# Patient Record
Sex: Female | Born: 1950 | Race: White | Hispanic: No | Smoking: Former smoker
Health system: Southern US, Community
[De-identification: ages and names within clinical notes are randomized; demographics above are authoritative.]

## PROBLEM LIST (undated history)

## (undated) DIAGNOSIS — E669 Obesity, unspecified: Secondary | ICD-10-CM

## (undated) DIAGNOSIS — I499 Cardiac arrhythmia, unspecified: Secondary | ICD-10-CM

## (undated) DIAGNOSIS — J45909 Unspecified asthma, uncomplicated: Secondary | ICD-10-CM

## (undated) DIAGNOSIS — K5792 Diverticulitis of intestine, part unspecified, without perforation or abscess without bleeding: Secondary | ICD-10-CM

## (undated) DIAGNOSIS — E785 Hyperlipidemia, unspecified: Secondary | ICD-10-CM

## (undated) DIAGNOSIS — I639 Cerebral infarction, unspecified: Secondary | ICD-10-CM

## (undated) DIAGNOSIS — Z952 Presence of prosthetic heart valve: Secondary | ICD-10-CM

## (undated) DIAGNOSIS — I10 Essential (primary) hypertension: Secondary | ICD-10-CM

## (undated) DIAGNOSIS — G4733 Obstructive sleep apnea (adult) (pediatric): Secondary | ICD-10-CM

## (undated) DIAGNOSIS — M1990 Unspecified osteoarthritis, unspecified site: Secondary | ICD-10-CM

## (undated) DIAGNOSIS — Z720 Tobacco use: Secondary | ICD-10-CM

## (undated) DIAGNOSIS — I495 Sick sinus syndrome: Secondary | ICD-10-CM

## (undated) DIAGNOSIS — I471 Supraventricular tachycardia, unspecified: Secondary | ICD-10-CM

## (undated) DIAGNOSIS — D179 Benign lipomatous neoplasm, unspecified: Secondary | ICD-10-CM

## (undated) DIAGNOSIS — F419 Anxiety disorder, unspecified: Secondary | ICD-10-CM

## (undated) DIAGNOSIS — G709 Myoneural disorder, unspecified: Secondary | ICD-10-CM

## (undated) DIAGNOSIS — F32A Depression, unspecified: Secondary | ICD-10-CM

## (undated) DIAGNOSIS — G4730 Sleep apnea, unspecified: Secondary | ICD-10-CM

## (undated) DIAGNOSIS — I5042 Chronic combined systolic (congestive) and diastolic (congestive) heart failure: Secondary | ICD-10-CM

## (undated) DIAGNOSIS — I5189 Other ill-defined heart diseases: Secondary | ICD-10-CM

## (undated) DIAGNOSIS — I340 Nonrheumatic mitral (valve) insufficiency: Secondary | ICD-10-CM

## (undated) DIAGNOSIS — I38 Endocarditis, valve unspecified: Secondary | ICD-10-CM

## (undated) DIAGNOSIS — M549 Dorsalgia, unspecified: Secondary | ICD-10-CM

## (undated) DIAGNOSIS — G8929 Other chronic pain: Secondary | ICD-10-CM

## (undated) DIAGNOSIS — T7840XA Allergy, unspecified, initial encounter: Secondary | ICD-10-CM

## (undated) DIAGNOSIS — I480 Paroxysmal atrial fibrillation: Secondary | ICD-10-CM

## (undated) HISTORY — DX: Depression, unspecified: F32.A

## (undated) HISTORY — DX: Unspecified asthma, uncomplicated: J45.909

## (undated) HISTORY — PX: OVARY SURGERY: SHX727

## (undated) HISTORY — PX: BACK SURGERY: SHX140

## (undated) HISTORY — PX: SALPINGOOPHORECTOMY: SHX82

## (undated) HISTORY — DX: Other chronic pain: G89.29

## (undated) HISTORY — DX: Obstructive sleep apnea (adult) (pediatric): G47.33

## (undated) HISTORY — DX: Hyperlipidemia, unspecified: E78.5

## (undated) HISTORY — DX: Cardiac arrhythmia, unspecified: I49.9

## (undated) HISTORY — DX: Allergy, unspecified, initial encounter: T78.40XA

## (undated) HISTORY — DX: Obesity, unspecified: E66.9

## (undated) HISTORY — PX: TUMOR REMOVAL: SHX12

## (undated) HISTORY — PX: KNEE SURGERY: SHX244

## (undated) HISTORY — DX: Sick sinus syndrome: I49.5

## (undated) HISTORY — PX: ACHILLES TENDON SURGERY: SHX542

## (undated) HISTORY — DX: Sleep apnea, unspecified: G47.30

## (undated) HISTORY — DX: Nonrheumatic mitral (valve) insufficiency: I34.0

## (undated) HISTORY — DX: Anxiety disorder, unspecified: F41.9

## (undated) HISTORY — DX: Tobacco use: Z72.0

## (undated) HISTORY — DX: Other ill-defined heart diseases: I51.89

## (undated) HISTORY — DX: Benign lipomatous neoplasm, unspecified: D17.9

## (undated) HISTORY — DX: Dorsalgia, unspecified: M54.9

## (undated) HISTORY — DX: Presence of prosthetic heart valve: Z95.2

## (undated) HISTORY — DX: Chronic combined systolic (congestive) and diastolic (congestive) heart failure: I50.42

## (undated) HISTORY — PX: KNEE ARTHROSCOPY W/ MENISCAL REPAIR: SHX1877

## (undated) HISTORY — DX: Unspecified osteoarthritis, unspecified site: M19.90

## (undated) HISTORY — DX: Endocarditis, valve unspecified: I38

## (undated) HISTORY — DX: Diverticulitis of intestine, part unspecified, without perforation or abscess without bleeding: K57.92

## (undated) HISTORY — DX: Cerebral infarction, unspecified: I63.9

## (undated) HISTORY — DX: Myoneural disorder, unspecified: G70.9

---

## 2006-10-07 ENCOUNTER — Ambulatory Visit (HOSPITAL_BASED_OUTPATIENT_CLINIC_OR_DEPARTMENT_OTHER): Admission: RE | Admit: 2006-10-07 | Discharge: 2006-10-08 | Payer: Self-pay | Admitting: Orthopedic Surgery

## 2006-11-13 ENCOUNTER — Ambulatory Visit: Payer: Self-pay | Admitting: Vascular Surgery

## 2006-11-13 ENCOUNTER — Ambulatory Visit (HOSPITAL_COMMUNITY): Admission: RE | Admit: 2006-11-13 | Discharge: 2006-11-13 | Payer: Self-pay | Admitting: Orthopedic Surgery

## 2009-12-26 ENCOUNTER — Ambulatory Visit: Payer: Self-pay | Admitting: Family Medicine

## 2010-09-18 NOTE — Op Note (Signed)
NAMEDYMPHNA, WADLEY               ACCOUNT NO.:  [id number]   MEDICAL RECORD NO.:  [id number]          PATIENT TYPE:  AMB   LOCATION:  DSC                          FACILITY:  [facility]   PHYSICIAN:  Harvie Junior, M.D.   DATE OF BIRTH:  03-03-1951   DATE OF PROCEDURE:  DATE OF DISCHARGE:                               OPERATIVE REPORT   PREOPERATIVE DIAGNOSES:  1. Medial and lateral meniscal tears.  2. Anterior cruciate ligament tear.  3. Patellofemoral chondromalacia.   POSTOPERATIVE DIAGNOSES:  1. Medial and lateral meniscal tears.  2. Anterior cruciate ligament tear.  3. Patellofemoral chondromalacia.   PRINCIPLE PROCEDURE:  1. Anterior cruciate ligament reconstruction with central one-third      patellar tendon allograft.  2. Debridement of posterior horn medial meniscal tear.  3. Debridement of posterior horn lateral meniscal tear with      corresponding debridement in those compartments.  4. Debridement of chondromalacia patella.   SURGEON:  Harvie Junior, M.D.   ASSISTANT:  Marshia Ly, P.A.   ANESTHESIA:  General.   BRIEF HISTORY:  The patient is a 60 year old female with a history of  having had a hyperextension type injury falling off a ladder.  She  suffered anterior cruciate ligament tear as well as medial and lateral  meniscal tears.  She was treated conservatively for a period of time  with failure.  Subsequently complains of pain and symptoms of the knee  feels like it wants to give out.  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  She was brought to the operating room for evaluation and  decision making.   DESCRIPTION OF THE PROCEDURE:  The patient was brought to the operating  room.   After adequate anesthesia was obtained with general anesthetic,  the patient was placed on the operating table.  The right leg was  prepped and draped in the usual sterile fashion.  Following this,  routine arthroscopic examination of the knee revealed there was an  obvious posterior horn lateral and posterior horn medial meniscal tears.  These were debrided with straight biting forceps back to a smooth and  stable rim.  We examined her under anesthesia and noted her to have  quite a bit of grinding, especially back laterally and that she was  quite unstable.  We did the arthroscopy as outlined, looked at the ACL.  It was obviously completely torn.  At this point we made a close  examination of the medial compartment.  There was some grade 2 change on  the tibia.  The medial femoral condyle was pristine.  Lateral  compartment also showed some grade 2 change on the tibia but lateral  femoral condyle was pristine as well.  At that point it was felt that  the most appropriate course of action was going to be ACL  reconstruction, and this was performed in the following fashion.  We did  a notchplasty with a combination of a suction shaver and bur.  Once this  was done, we used a tibial guide and this was set 7 mm anterior to the  posterior cruciate ligament.  Using the gun barrel, we made a small  incision down distally and then made a reamed hole of 10 over the  centralizing gun barrel guidewire.  Once this was done, a rasp was used  to rasp the back tunnel.  A 7 mm over-the-top guide was then chosen just  over the back of the femoral side and a hole was drilled.  We check to  make sure it was a hole and not a ditch.  Once this was done, we drilled  this out to 22 mm for a 22 mm femoral side bone plug.  A notch was  placed for the screw, and the graft was then advanced into the knee out  of a guidewire which had been advanced out of the distal lateral femur.  Once a new graft was then placed,  it was locked on the femoral side by 7  x 20 screw.  The knee was cycled 20 times through range of motion and  checking for stability.  No tendency towards anisometry.  The femoral  side graft was locked in place by an 8 x 20 screw, and the remaining  bone plug, which was sticking out, was nibbled off.  The final check was  made medially and laterally for any remaining pieces.  None were seen.  The knee was copiously and thoroughly irrigated and suctioned dry.  Then  all portals were closed with a bandage.  The distal incision was closed  with interrupted Vicryl and Maxon pullout suture.  Benzo and Steri-  Strips were applied.  A sterile compressive dressing was applied as well  as a knee immobilizer.  The patient was taken to the recovery room and  was noted to be in satisfactory condition.  Estimated blood loss from  the procedure was none.      Harvie Junior, M.D.  Electronically Signed     JLG/MEDQ  D:  10/07/2006  T:  10/07/2006  Job:  [id number]   cc:   _________

## 2011-02-21 LAB — BASIC METABOLIC PANEL
BUN: 16
Calcium: 9.3
GFR calc non Af Amer: >60
Glucose, Bld: 173 — ABNORMAL HIGH
Sodium: 141

## 2011-02-21 LAB — POCT HEMOGLOBIN-HEMACUE: Operator id: 116011

## 2011-09-04 ENCOUNTER — Ambulatory Visit: Payer: Self-pay

## 2014-07-15 ENCOUNTER — Emergency Department (HOSPITAL_COMMUNITY): Payer: 59

## 2014-07-15 ENCOUNTER — Encounter (HOSPITAL_COMMUNITY): Payer: Self-pay

## 2014-07-15 ENCOUNTER — Emergency Department (HOSPITAL_COMMUNITY)
Admission: EM | Admit: 2014-07-15 | Discharge: 2014-07-15 | Disposition: A | Discharge status: Home / Self Care | Payer: 59 | Attending: Emergency Medicine | Admitting: Emergency Medicine

## 2014-07-15 DIAGNOSIS — I471 Supraventricular tachycardia, unspecified: Secondary | ICD-10-CM | POA: Diagnosis present

## 2014-07-15 DIAGNOSIS — I10 Essential (primary) hypertension: Secondary | ICD-10-CM | POA: Diagnosis not present

## 2014-07-15 DIAGNOSIS — Z79899 Other long term (current) drug therapy: Secondary | ICD-10-CM | POA: Insufficient documentation

## 2014-07-15 DIAGNOSIS — E876 Hypokalemia: Secondary | ICD-10-CM | POA: Diagnosis present

## 2014-07-15 DIAGNOSIS — Z791 Long term (current) use of non-steroidal anti-inflammatories (NSAID): Secondary | ICD-10-CM | POA: Insufficient documentation

## 2014-07-15 DIAGNOSIS — Z720 Tobacco use: Secondary | ICD-10-CM | POA: Insufficient documentation

## 2014-07-15 DIAGNOSIS — R000 Tachycardia, unspecified: Secondary | ICD-10-CM | POA: Diagnosis present

## 2014-07-15 HISTORY — DX: Essential (primary) hypertension: I10

## 2014-07-15 LAB — CBC WITH DIFFERENTIAL/PLATELET
BASOS ABS: 0 10*3/uL (ref 0.0–0.1)
Basophils Relative: 0 % (ref 0–1)
EOS PCT: 1 % (ref 0–5)
Eosinophils Absolute: 0.1 10*3/uL (ref 0.0–0.7)
HCT: 45.3 % (ref 36.0–46.0)
Hemoglobin: 14.8 g/dL (ref 12.0–15.0)
LYMPHS PCT: 28 % (ref 12–46)
Lymphs Abs: 2.7 10*3/uL (ref 0.7–4.0)
MCH: 28.5 pg (ref 26.0–34.0)
MCHC: 32.7 g/dL (ref 30.0–36.0)
MCV: 87.1 fL (ref 78.0–100.0)
Monocytes Absolute: 0.6 10*3/uL (ref 0.1–1.0)
Monocytes Relative: 6 % (ref 3–12)
NEUTROS ABS: 6.2 10*3/uL (ref 1.7–7.7)
NEUTROS PCT: 65 % (ref 43–77)
PLATELETS: 260 10*3/uL (ref 150–400)
RBC: 5.2 MIL/uL — AB (ref 3.87–5.11)
RDW: 13.8 % (ref 11.5–15.5)
WBC: 9.6 10*3/uL (ref 4.0–10.5)

## 2014-07-15 LAB — BASIC METABOLIC PANEL
Anion gap: 12 (ref 5–15)
BUN: 14 mg/dL (ref 6–23)
CO2: 22 mmol/L (ref 19–32)
Calcium: 9.1 mg/dL (ref 8.4–10.5)
Chloride: 106 mmol/L (ref 96–112)
Creatinine, Ser: 1.12 mg/dL — ABNORMAL HIGH (ref 0.50–1.10)
GFR calc Af Amer: 59 mL/min — ABNORMAL LOW (ref >90)
GFR calc non Af Amer: 51 mL/min — ABNORMAL LOW (ref >90)
GLUCOSE: 177 mg/dL — AB (ref 70–99)
POTASSIUM: 3 mmol/L — AB (ref 3.5–5.1)
SODIUM: 140 mmol/L (ref 135–145)

## 2014-07-15 LAB — TSH: TSH: 2.682 u[IU]/mL (ref 0.350–4.500)

## 2014-07-15 LAB — I-STAT TROPONIN, ED: TROPONIN I, POC: 0 ng/mL (ref 0.00–0.08)

## 2014-07-15 LAB — MAGNESIUM: Magnesium: 1.9 mg/dL (ref 1.5–2.5)

## 2014-07-15 MED ORDER — DILTIAZEM HCL 25 MG/5ML IV SOLN
10.0000 mg | Freq: Once | INTRAVENOUS | Status: AC
  Administered 2014-07-15: 10 mg via INTRAVENOUS

## 2014-07-15 MED ORDER — DILTIAZEM HCL 100 MG IV SOLR
5.0000 mg/h | Freq: Once | INTRAVENOUS | Status: AC
  Administered 2014-07-15: 5 mg/h via INTRAVENOUS

## 2014-07-15 MED ORDER — DILTIAZEM HCL ER COATED BEADS 120 MG PO TB24: 120.0000 mg | ORAL_TABLET | Freq: Every day | ORAL | Status: DC

## 2014-07-15 MED ORDER — SODIUM CHLORIDE 0.9 % IV BOLUS (SEPSIS)
500.0000 mL | Freq: Once | INTRAVENOUS | Status: AC
  Administered 2014-07-15: 500 mL via INTRAVENOUS

## 2014-07-15 MED ORDER — POTASSIUM CHLORIDE 10 MEQ/100ML IV SOLN
10.0000 meq | Freq: Once | INTRAVENOUS | Status: AC
  Administered 2014-07-15: 10 meq via INTRAVENOUS
  Filled 2014-07-15: qty 100

## 2014-07-15 MED ORDER — DILTIAZEM HCL ER COATED BEADS 120 MG PO TB24
120.0000 mg | ORAL_TABLET | Freq: Every day | ORAL | Status: DC
  Administered 2014-07-15: 120 mg via ORAL
  Filled 2014-07-15 (×2): qty 1

## 2014-07-15 MED ORDER — POTASSIUM CHLORIDE CRYS ER 20 MEQ PO TBCR
30.0000 meq | EXTENDED_RELEASE_TABLET | Freq: Once | ORAL | Status: AC
  Administered 2014-07-15: 30 meq via ORAL
  Filled 2014-07-15: qty 2

## 2014-07-15 NOTE — ED Notes (Signed)
PER EMS: pt going in and out of SVT, episodes lasting about 15 seconds. EMS did not give Adenosine because she was going in and out of it so often. Pt reports she has been experiencing the "fluttering" for the past 6 months but has not been seen by a doctor for it. Pt A&OX4. Denies CP.

## 2014-07-15 NOTE — ED Notes (Signed)
Cardiology, PA at bedside  

## 2014-07-15 NOTE — ED Provider Notes (Signed)
CSN: [id number]     Arrival date & time 07/15/14  1515 History   First MD Initiated Contact with Patient 07/15/14 1517     Chief Complaint  Patient presents with  . Irregular Heart Beat  . SVT      (Consider location/radiation/quality/duration/timing/severity/associated sxs/prior Treatment) HPI   Patient w/ no sig PMH p/w palpitations since 1pm today almost constantly associated w/ some mild lightheadedness, no CP/SOB.  Patient has been having intermittent episodes for the past 6 months.  EMS obtained rhythm strip w/ rate approx 200, narrow complex and regular.  Brought her to the ED.  bp good in route.  Past Medical History  Diagnosis Date  . Hypertension    Past Surgical History  Procedure Laterality Date  . Back surgery    . Ovary surgery     No family history on file. History  Substance Use Topics  . Smoking status: Current Some Day Smoker  . Smokeless tobacco: Not on file  . Alcohol Use: Yes     Comment: socially   OB History    No data available     Review of Systems  Constitutional: Negative for fever and chills.  HENT: Negative for nosebleeds.   Eyes: Negative for visual disturbance.  Respiratory: Negative for cough and shortness of breath.   Cardiovascular: Positive for palpitations. Negative for chest pain.  Gastrointestinal: Negative for nausea, vomiting, abdominal pain, diarrhea and constipation.  Genitourinary: Negative for dysuria.  Skin: Negative for rash.  Neurological: Negative for weakness.      Allergies  Review of patient's allergies indicates no known allergies.  Home Medications   Prior to Admission medications   Medication Sig Start Date End Date Taking? Authorizing Provider  clonazePAM (KLONOPIN) 0.5 MG tablet Take 0.5 mg by mouth at bedtime as needed. 06/22/14   Historical Provider, MD  naproxen sodium (ANAPROX) 220 MG tablet Take 220 mg by mouth daily as needed.    Historical Provider, MD   BP 131/105 mmHg  Pulse 100  Temp(Src)  97.7 F (36.5 C) (Oral)  Resp 20  SpO2 98% Physical Exam  Constitutional: She is oriented to person, place, and time. No distress.  HENT:  Head: Normocephalic and atraumatic.  Eyes: EOM are normal. Pupils are equal, round, and reactive to light.  Neck: Normal range of motion. Neck supple.  Cardiovascular: Intact distal pulses.   Tachy   Pulmonary/Chest: No respiratory distress.  Abdominal: Soft. There is no tenderness.  Neurological: She is alert and oriented to person, place, and time.  Skin: No rash noted. She is not diaphoretic.    ED Course  Procedures (including critical care time) Labs Review Labs Reviewed  CBC WITH DIFFERENTIAL/PLATELET  BASIC METABOLIC PANEL  MAGNESIUM  TSH  URINE RAPID DRUG SCREEN (HOSP PERFORMED)  I-STAT TROPOININ, ED    Imaging Review No results found.   EKG Interpretation   Date/Time:  Friday July 15 2014 15:24:59 EST Ventricular Rate:  167 PR Interval:    QRS Duration: 76 QT Interval:  288 QTC Calculation: 480 R Axis:   54 Text Interpretation:  Atrial fibrillation with rapid V-rate ST depression,  probably rate related Baseline wander in lead(s) V3 V5 compared to prior,  a fib has replaced sinus rhythm Confirmed by [facility]  MD, TREY ([id number]) on  07/15/2014 3:43:43 PM      MDM   Final diagnoses:  None    Patient w/ no sig PMH p/w palpitations since 1pm today almost constantly associated w/ some mild  lightheadedness, no CP/SOB.  Exam as above, intermittently tachy to 200, narrow, regular.  Good bp.  Concern for SVT vs. Flutter vs. MAT  Narrow, will give 10 mg dilt bolus.  Will obtain labs, give small fluid bolus.  Will speak w/ cards.  Labs w/ K of 3.0, will give K.  Labs otherwise unremarkable.  HR well controlled on dilt gtt w/ bolus.  Patient is now in sinus on monitor w/ rates 70's-80's.  Cards consulted and recommend starting dilt PO and f/u as outpatient.  Cards thinks dx most likely SVT.  Patient is feeling much  better at this time and is on board w/ this plan.  I have discussed the results, Dx and Tx plan with the patient. They expressed understanding and agree with the plan and were told to return to ED with any worsening of condition or concern.    Disposition: Discharge  Condition: Good  Discharge Medication List as of 07/15/2014 10:01 PM    START taking these medications   Details  diltiazem (CARDIZEM LA) 120 MG 24 hr tablet Take 1 tablet (120 mg total) by mouth daily., Starting 07/15/2014, Until Discontinued, Print        Follow Up: Minna Merritts, MD [place] [id number] [phone]  Schedule an appointment as soon as possible for a visit in 2 weeks    Pt seen in conjunction with Dr. Johnette Abraham, MD 07/16/14 0340  Serita Grit, MD 07/16/14 1640

## 2014-07-15 NOTE — Discharge Instructions (Signed)
Supraventricular Tachycardia °Supraventricular tachycardia (SVT) is an abnormal heart rhythm (arrhythmia) that causes the heart to beat very fast (tachycardia). This kind of fast heartbeat originates in the upper chambers of the heart (atria). SVT can cause the heart to beat greater than 100 beats per minute. SVT can have a rapid burst of heartbeats. This can start and stop suddenly without warning and is called nonsustained. SVT can also be sustained, in which the heart beats at a continuous fast rate.  °CAUSES  °There can be different causes of SVT. Some of these include: °· Heart valve problems such as mitral valve prolapse. °· An enlarged heart (hypertrophic cardiomyopathy). °· Congenital heart problems. °· Heart inflammation (pericarditis). °· Hyperthyroidism. °· Low potassium or magnesium levels. °· Caffeine. °· Drug use such as cocaine, methamphetamines, or stimulants. °· Some over-the-counter medicines such as: °¨ Decongestants. °¨ Diet medicines. °¨ Herbal medicines. °SYMPTOMS  °Symptoms of SVT can vary. Symptoms depend on whether the SVT is sustained or nonsustained. You may experience: °· No symptoms (asymptomatic). °· An awareness of your heart beating rapidly (palpitations). °· Shortness of breath. °· Chest pain or pressure. °If your blood pressure drops because of the SVT, you may experience: °· Fainting or near fainting. °· Weakness. °· Dizziness. °DIAGNOSIS  °Different tests can be performed to diagnose SVT, such as: °· An electrocardiogram (EKG). This is a painless test that records the electrical activity of your heart. °· Holter monitor. This is a 24 hour recording of your heart rhythm. You will be given a diary. Write down all symptoms that you have and what you were doing at the time you experienced symptoms. °· Arrhythmia monitor. This is a small device that your wear for several weeks. It records the heart rhythm when you have symptoms. °· Echocardiogram. This is an imaging test to help detect  abnormal heart structure such as congenital abnormalities, heart valve problems, or heart enlargement. °· Stress test. This test can help determine if the SVT is related to exercise. °· Electrophysiology study (EPS). This is a procedure that evaluates your heart's electrical system and can help your caregiver find the cause of your SVT. °TREATMENT  °Treatment of SVT depends on the symptoms, how often it recurs, and whether there are any underlying heart problems.  °· If symptoms are rare and no other cardiac disease is present, no treatment may be needed. °· Blood work may be done to check potassium, magnesium, and thyroid hormone levels to see if they are abnormal. If these levels are abnormal, treatment to correct the problems will occur. °Medicines °Your caregiver may use oral medicines to treat SVT. These medicines are given for long-term control of SVT. Medicines may be used alone or in combination with other treatments. These medicines work to slow nerve impulses in the heart muscle. These medicines can also be used to treat high blood pressure. Some of these medicines may include: °· Calcium channel blockers. °· Beta blockers. °· Digoxin. °Nonsurgical procedures °Nonsurgical techniques may be used if oral medicines do not work. Some examples include: °· Cardioversion. This technique uses either drugs or an electrical shock to restore a normal heart rhythm. °¨ Cardioversion drugs may be given through an intravenous (IV) line to help "reset" the heart rhythm. °¨ In electrical cardioversion, the caregiver shocks your heart to stop its beat for a split second. This helps to reset the heart to a normal rhythm. °· Ablation. This procedure is done under mild sedation. High frequency radio wave energy is used to   destroy the area of heart tissue responsible for the SVT. °HOME CARE INSTRUCTIONS  °· Do not smoke. °· Only take medicines prescribed by your caregiver. Check with your caregiver before using over-the-counter  medicines. °· Check with your caregiver about how much alcohol and caffeine (coffee, tea, colas, or chocolate) you may have. °· It is very important to keep all follow-up referrals and appointments in order to properly manage this problem. °SEEK IMMEDIATE MEDICAL CARE IF: °· You have dizziness. °· You faint or nearly faint. °· You have shortness of breath. °· You have chest pain or pressure. °· You have sudden nausea or vomiting. °· You have profuse sweating. °· You are concerned about how long your symptoms last. °· You are concerned about the frequency of your SVT episodes. °If you have the above symptoms, call your local emergency services (911 in [place] immediately. Do not drive yourself to the hospital. °MAKE SURE YOU:  °· Understand these instructions. °· Will watch your condition. °· Will get help right away if you are not doing well or get worse. °Document Released: 04/22/2005 Document Revised: 07/15/2011 Document Reviewed: 08/04/2008 °ExitCare® Patient Information ©2015 [employer]. This information is not intended to replace advice given to you by your health care provider. Make sure you discuss any questions you have with your health care provider. ° °

## 2014-07-15 NOTE — ED Provider Notes (Signed)
I saw and evaluated the patient, reviewed the resident's note and I agree with the findings and plan.   EKG Interpretation   Date/Time:  Friday July 15 2014 15:24:59 EST Ventricular Rate:  167 PR Interval:    QRS Duration: 76 QT Interval:  288 QTC Calculation: 480 R Axis:   54 Text Interpretation:  Atrial fibrillation with rapid V-rate ST depression,  probably rate related Baseline wander in lead(s) V3 V5 compared to prior,  a fib has replaced sinus rhythm Confirmed by [facility]  MD, TREY ([id number]) on  07/15/2014 3:43:43 PM      CRITICAL CARE Performed by: Arbie Cookey   Total critical care time: 35  Critical care time was exclusive of separately billable procedures and treating other patients.  Critical care was necessary to treat or prevent imminent or life-threatening deterioration.  Critical care was time spent personally by me on the following activities: development of treatment plan with patient and/or surrogate as well as nursing, discussions with consultants, evaluation of patient's response to treatment, examination of patient, obtaining history from patient or surrogate, ordering and performing treatments and interventions, ordering and review of laboratory studies, ordering and review of radiographic studies, pulse oximetry and re-evaluation of patient's condition.   64 yo female with hx of HTN who presents with intermittent lightheadedness which appears to be due to abnormal heart rhythm.  No syncope.  She has had rare episodes of this feeling for past several months.  Today, it occurred while shopping.  On exam, well appearing, nontoxic, not distressed, normal respiratory effort, normal perfusion, Irregularly irregular rhythm with HR ranging from 100's to 200's. Plan Diltiazem drip, bolus.  Labs.  Cardiac monitoring.    Heart rate controlled with diltiazem.  Cardiology evaluated pt and felt that her rhythm was likely SVT.  They did not think she needed  anticoagulation or hospitalization.  They recommended PO diltiazem and close follow up.   Clinical Impression: 1. Essential hypertension   2. SVT (supraventricular tachycardia)       Serita Grit, MD 07/16/14 1640

## 2014-07-15 NOTE — ED Notes (Signed)
Per Dr. Doy Mince, await 15 minutes for Cardizem PO and IV medication to overlap before discontinuing drip.

## 2014-07-15 NOTE — Consult Note (Signed)
CARDIOLOGY CONSULT NOTE   Patient ID: Tina Peters MRN: [id number] DOB/AGE: [birth year] 64 y.o.  Admit date: 07/15/2014  Primary Physician   [facility] in [place], [place] Primary Cardiologist   None Reason for Consultation   ?SVT  [id number] Tina Peters is a 64 y.o. year old female with a history of palpitations for about 8 months.   She has been getting them about once/month. They start slowly, then increase in rate. When they are fast, they make her feel light-headed, flushed and sweaty. The episodes have been lasting several hours, but always would stop spontaneously.  Today, she got one where it felt like her heart was beating faster and harder than usual. She went to work, but was immediately sent to the [place], who transported her to the ER. The ECG showed ?SVT vs rapid atrial fib. She was in/out of this enroute. She was placed on a Cardizem gtt in the ER and is maintaining SR with frequent PACs. Once her heart rate slowed down, her symptoms resolved. Currently she is resting comfortably.  Says her BP medication was changed at the first of the year. Denies excessive caffeine use, OTC cold medications or other unusual medications or stressors. Smokes occasionally, used to smoke heavily.  Past Medical History  Diagnosis Date  . Hypertension      Past Surgical History  Procedure Laterality Date  . Back surgery    . Ovary surgery     No Known Allergies  I have reviewed the patient's current medications Prior to Admission medications   Medication Sig Start Date End Date Taking? Authorizing Provider  clonazePAM (KLONOPIN) 0.5 MG tablet Take 0.5 mg by mouth at bedtime as needed. 06/22/14  Yes Historical Provider, MD  loratadine (CLARITIN) 10 MG tablet Take 10 mg by mouth daily.   Yes Historical Provider, MD  naproxen sodium (ANAPROX) 220 MG tablet Take 220 mg by mouth daily as needed.   Yes Historical Provider, MD  PRESCRIPTION MEDICATION    Yes Historical Provider, MD       History   Social History  . Marital Status: Divorced    Spouse Name: N/A  . Number of Children: N/A  . Years of Education: N/A   Occupational History  . [employer]     Social History Main Topics  . Smoking status: Current Some Day Smoker -- 0.25 packs/day for 40 years    Types: Cigarettes  . Smokeless tobacco: Not on file     Comment: Used to smoke heavily, cut back in 2015  . Alcohol Use: 0.0 oz/week    0 Standard drinks or equivalent per week     Comment: socially, 1-2 drinks/week  . Drug Use: No  . Sexual Activity: Not on file   Other Topics Concern  . Not on file   Social History Narrative   Lives alone.    Family Status  Relation Status Death Age  . Mother Alive     PAD, DM, ?CAD  . Father Deceased     CAD   Family History  Problem Relation Age of Onset  . Diabetes Mother      ROS:  Full 14 point review of systems complete and found to be negative unless listed above.  Physical Exam: Blood pressure 127/62, pulse 66, temperature 97.7 F (36.5 C), temperature source Oral, resp. rate 19, SpO2 98 %.  General: Well developed, well nourished, female in no acute distress Head: Eyes PERRLA, No xanthomas.  Normocephalic and atraumatic, oropharynx without edema or exudate. Dentition: fair Lungs: scattered rales Heart: HRRR S1 S2, no rub/gallop, no murmur. pulses are 2+ all 4 extrem.   Neck: No carotid bruits. No lymphadenopathy.  JVD not elevated. Abdomen: Bowel sounds present, abdomen soft and non-tender without masses or hernias noted. Msk:  No spine or cva tenderness. No weakness, no joint deformities or effusions. Extremities: No clubbing or cyanosis. no edema.  Neuro: Alert and oriented X 3. No focal deficits noted. Psych:  Good affect, responds appropriately Skin: No rashes or lesions noted.  Labs:   Lab Results  Component Value Date   WBC 9.6 07/15/2014   HGB 14.8 07/15/2014   HCT 45.3 07/15/2014   MCV 87.1 07/15/2014   PLT 260  07/15/2014    Recent Labs Lab 07/15/14 1550  NA 140  K 3.0*  CL 106  CO2 22  BUN 14  CREATININE 1.12*  CALCIUM 9.1  GLUCOSE 177*   MAGNESIUM  Date Value Ref Range Status  07/15/2014 1.9 1.5 - 2.5 mg/dL Final    Recent Labs  07/15/14 1554  TROPIPOC 0.00   ECG:  SVT, on EMS strips and ECG In ER, SR w/ PACs  Radiology:  Dg Chest Portable 1 View 07/15/2014   CLINICAL DATA:  Irregular heartbeat.  Hypertension.  Asthma.  EXAM: PORTABLE CHEST - 1 VIEW  COMPARISON:  None.  FINDINGS: Numerous leads and wires project over the chest. Midline trachea. Normal heart size. Atherosclerosis in the transverse aorta. Mild right hemidiaphragm elevation. No pleural effusion or pneumothorax. Clear lungs.  IMPRESSION: No acute cardiopulmonary disease.   Electronically Signed   By: Abigail Miyamoto M.D.   On: 07/15/2014 16:01    ASSESSMENT AND PLAN:   The patient was seen today by Dr. Debara Pickett, the patient evaluated and the data reviewed.  Principal Problem:   SVT (supraventricular tachycardia) - s/p spontaneous conversion to SR with PACs - maintaining SR on Cardizem gtt at 5 mg/hr - TSH is pending - MD advise on changing to PO medication and d/c home - could f/u as OP with EP after getting echo   Active Problems:   Hypertension - room on BP to use BB/CCB    Hypokalemia - s/p 2 runs IV KCl  Signed: Rosaria Ferries, PA-C 07/15/2014 8:11 PM Beeper [phone]  Co-Sign MD

## 2014-08-01 ENCOUNTER — Encounter: Payer: Self-pay | Admitting: *Deleted

## 2014-08-02 ENCOUNTER — Encounter: Payer: Self-pay | Admitting: Cardiovascular Disease

## 2014-08-02 ENCOUNTER — Ambulatory Visit (INDEPENDENT_AMBULATORY_CARE_PROVIDER_SITE_OTHER): Payer: 59 | Admitting: Cardiovascular Disease

## 2014-08-02 VITALS — BP 134/76 | HR 73 | Ht 65.0 in | Wt 226.5 lb

## 2014-08-02 DIAGNOSIS — I10 Essential (primary) hypertension: Secondary | ICD-10-CM

## 2014-08-02 DIAGNOSIS — I471 Supraventricular tachycardia: Secondary | ICD-10-CM | POA: Diagnosis not present

## 2014-08-02 MED ORDER — DILTIAZEM HCL ER COATED BEADS 120 MG PO TB24: 120.0000 mg | ORAL_TABLET | Freq: Every day | ORAL | Status: DC

## 2014-08-02 NOTE — Patient Instructions (Signed)
Stop taking Hydrochlorothiazide.  Continue Diltiazem ER 120 mg once daily.   Your physician has requested that you have an echocardiogram. Echocardiography is a painless test that uses sound waves to create images of your heart. It provides your doctor with information about the size and shape of your heart and how well your heart's chambers and valves are working. This procedure takes approximately one hour. There are no restrictions for this procedure.  Follow up in 2 months.

## 2014-08-02 NOTE — Progress Notes (Signed)
Primary care physician: Dr. Clide Deutscher  HPI  This is a pleasant 64 year old female who was referred from the emergency room at [facility] for evaluation of paroxysmal supraventricular tachycardia. She has no previous cardiac history. She has known history of hypertension, hyperlipidemia and tobacco use. There is no family history of premature coronary artery disease. She was placed on hydrochlorothiazide about 9 months ago due to hypertension and leg edema. Shortly after that, she started having recurrent episodes of dizziness, palpitations and tachycardia. She went to the emergency room and was found to be in SVT with recurrent episodes. She was noted to be hypokalemic with a potassium of 3. She was placed on small dose extended release diltiazem with improvement in symptoms but she has been taking only half tablet daily. She was supposed to discontinue hydrochlorothiazide but she has been taking half a tablet a day. During the episodes of tachycardia, she did have chest discomfort. She has chronic exertional dyspnea.  No Known Allergies   Current Outpatient Prescriptions on File Prior to Visit  Medication Sig Dispense Refill  . clonazePAM (KLONOPIN) 0.5 MG tablet Take 0.5 mg by mouth at bedtime as needed.    . loratadine (CLARITIN) 10 MG tablet Take 10 mg by mouth daily.    . naproxen sodium (ANAPROX) 220 MG tablet Take 220 mg by mouth daily as needed.     No current facility-administered medications on file prior to visit.     Past Medical History  Diagnosis Date  . Hypertension   . Hypertension   . Obesity   . Chronic back pain   . Lipoma   . Arrhythmia   . Nephrolithiasis   . Diverticulitis   . Hyperlipidemia   . Asthma      Past Surgical History  Procedure Laterality Date  . Back surgery    . Ovary surgery    . Tumor removal      neck  . Knee surgery    . Achilles tendon surgery    . Knee arthroscopy w/ meniscal repair       Family History  Problem Relation Age of Onset    . Diabetes Mother      History   Social History  . Marital Status: Divorced    Spouse Name: N/A  . Number of Children: N/A  . Years of Education: N/A   Occupational History  . [employer]     Social History Main Topics  . Smoking status: Current Some Day Smoker -- 0.25 packs/day for 40 years    Types: Cigarettes  . Smokeless tobacco: Not on file     Comment: Used to smoke heavily, cut back in 2015  . Alcohol Use: 0.0 oz/week    0 Standard drinks or equivalent per week     Comment: socially, 1-2 drinks/week  . Drug Use: No  . Sexual Activity: Not on file   Other Topics Concern  . Not on file   Social History Narrative   Lives alone.     ROS A 10 point review of system was performed. It is negative other than that mentioned in the history of present illness.   PHYSICAL EXAM   BP 134/76 mmHg  Pulse 73  Ht 5\' 5"  (1.651 m)  Wt 226 lb 8 oz (102.74 kg)  BMI 37.69 kg/m2 Constitutional: She is oriented to person, place, and time. She appears well-developed and well-nourished. No distress.  HENT: No nasal discharge.  Head: Normocephalic and atraumatic.  Eyes: Pupils are equal and  round. No discharge.  Neck: Normal range of motion. Neck supple. No JVD present. No thyromegaly present.  Cardiovascular: Normal rate, regular rhythm, normal heart sounds. Exam reveals no gallop and no friction rub. No murmur heard.  Pulmonary/Chest: Effort normal and breath sounds normal. No stridor. No respiratory distress. She has no wheezes. She has no rales. She exhibits no tenderness.  Abdominal: Soft. Bowel sounds are normal. She exhibits no distension. There is no tenderness. There is no rebound and no guarding.  Musculoskeletal: Normal range of motion. She exhibits no edema and no tenderness.  Neurological: She is alert and oriented to person, place, and time. Coordination normal.  Skin: Skin is warm and dry. No rash noted. She is not diaphoretic. No erythema. No pallor.   Psychiatric: She has a normal mood and affect. Her behavior is normal. Judgment and thought content normal.     EKG: Sinus  Rhythm  - frequent PAC s  # PACs = 2. -Prominent R(V1) -nonspecific.   BORDERLINE   ASSESSMENT AND PLAN

## 2014-08-02 NOTE — Assessment & Plan Note (Signed)
I asked her to stop hydrochlorothiazide completely and continue diltiazem. I asked her to monitor for leg edema.

## 2014-08-02 NOTE — Assessment & Plan Note (Signed)
The patient has paroxysmal supraventricular tachycardia likely triggered by severe hypokalemia related to hydrochlorothiazide. I recommend continuing diltiazem extended release 120 mg once daily. I requested an echocardiogram. If she has recurrent chest pain outside of tachycardia, I recommend a stress test.

## 2014-08-22 ENCOUNTER — Telehealth: Payer: Self-pay

## 2014-08-22 NOTE — Telephone Encounter (Signed)
Pt daughter called, states pt has been swelling, also experiencing palpitations, and pain inbetween her shoulder blade  Pt c/o swelling: STAT is pt has developed SOB within 24 hours  1. How long have you been experiencing swelling? 3 - 4 days  2. Where is the swelling located? Hands, legs, ankles dont look "too bad"  3.  Are you currently taking a "fluid pill"? no  4.  Are you currently SOB? Sometimes she is   5.  Have you traveled recently? no   I f calling tomorrow, please call daughter on cell [phone]

## 2014-08-23 NOTE — Telephone Encounter (Signed)
Attempted to contact [facility].  No answer, memory is full.

## 2014-08-25 NOTE — Telephone Encounter (Signed)
Attempted to contact [facility].   No answer, no machine.

## 2014-08-26 NOTE — Telephone Encounter (Signed)
Attempted to contact [facility].  No answer, memory is full, unable to leave message.

## 2014-08-29 ENCOUNTER — Telehealth: Payer: Self-pay | Admitting: Cardiovascular Disease

## 2014-08-29 ENCOUNTER — Telehealth: Payer: Self-pay | Admitting: *Deleted

## 2014-08-29 ENCOUNTER — Other Ambulatory Visit: Payer: Self-pay

## 2014-08-29 ENCOUNTER — Ambulatory Visit (INDEPENDENT_AMBULATORY_CARE_PROVIDER_SITE_OTHER): Payer: 59

## 2014-08-29 DIAGNOSIS — I471 Supraventricular tachycardia: Secondary | ICD-10-CM | POA: Diagnosis not present

## 2014-08-29 MED ORDER — METOPROLOL TARTRATE 25 MG PO TABS: 25.0000 mg | ORAL_TABLET | Freq: Two times a day (BID) | ORAL | Status: DC

## 2014-08-29 NOTE — Telephone Encounter (Signed)
Pt c/o medication issue:  1. Name of Medication: Cardizem  LA 120 mg PO 1x daily   2. How are you currently taking this medication (dosage and times per day)? See above  3. Are you having a reaction (difficulty breathing--STAT)? Patient complains of SOB but this is not new with medication C/O general sob when getting out of bed with back pain daughter thinks she may be experiencing some anxiety Patient also         Complains of SOB on exertion           Patient complains of some swelling B/L in legs feet ankles and tightness in fingers at end of work day            4. What is your medication issue? Skin breaking out on face  Dry and splotchy   PATIENT SAYS SHE FEELS BETTER ON THE MEDICATION AND SIDE EFFECTS ARE NOT AS BAD AS WITH OTHERS SHE HAS TRIED Please call patient to discuss issues

## 2014-08-29 NOTE — Telephone Encounter (Signed)
Pt was here for echo today was complaining of swelling in both ankles and breaking out.  Stated was coming from new medication cardizem.  S/w Dr.Arida and changed medication to Metoprolol Tartrate ( 25 mg ) bid and requested to be sent in to [facility] on [address].  Pt stated verbal understanding medicine sent in.

## 2014-08-29 NOTE — Telephone Encounter (Signed)
Pt is here for ECHO.

## 2014-09-29 ENCOUNTER — Ambulatory Visit (INDEPENDENT_AMBULATORY_CARE_PROVIDER_SITE_OTHER): Payer: 59 | Admitting: Cardiovascular Disease

## 2014-09-29 ENCOUNTER — Encounter: Payer: Self-pay | Admitting: Cardiovascular Disease

## 2014-09-29 VITALS — BP 130/80 | HR 72 | Ht 63.0 in | Wt 231.5 lb

## 2014-09-29 DIAGNOSIS — I471 Supraventricular tachycardia: Secondary | ICD-10-CM

## 2014-09-29 DIAGNOSIS — I10 Essential (primary) hypertension: Secondary | ICD-10-CM | POA: Diagnosis not present

## 2014-09-29 DIAGNOSIS — I872 Venous insufficiency (chronic) (peripheral): Secondary | ICD-10-CM | POA: Diagnosis not present

## 2014-09-29 MED ORDER — METOPROLOL TARTRATE 25 MG PO TABS: 25.0000 mg | ORAL_TABLET | Freq: Two times a day (BID) | ORAL | Status: DC

## 2014-09-29 NOTE — Progress Notes (Signed)
Primary care physician: Dr. Clide Deutscher  HPI  This is a pleasant 64 year old female who is here today for a follow-up visit regarding paroxysmal supraventricular tachycardia. She has no previous cardiac history. She has known history of hypertension, hyperlipidemia and tobacco use. There is no family history of premature coronary artery disease. She had one documented episode of supraventricular tachycardia in the setting of hypokalemia thought to be due to treatment with hydrochlorothiazide. She was placed on small dose extended release diltiazem with improvement in symptoms . However, she had worsening leg edema and thus I switched her to metoprolol 25 mg twice daily. She has been feeling significantly better since then. She denies any chest pain or shortness of breath. Leg edema improved but still did not resolve. This is usually worse at the end of the day. Echocardiogram showed normal LV systolic function, mild to moderate mitral regurgitation and mildly dilated left atrium   No Known Allergies   Current Outpatient Prescriptions on File Prior to Visit  Medication Sig Dispense Refill  . Aspirin-Caffeine (BC FAST PAIN RELIEF PO) Take by mouth as needed.    . clonazePAM (KLONOPIN) 0.5 MG tablet Take 0.5 mg by mouth at bedtime as needed.    . loratadine (CLARITIN) 10 MG tablet Take 10 mg by mouth daily.    . metoprolol tartrate (LOPRESSOR) 25 MG tablet Take 1 tablet (25 mg total) by mouth 2 (two) times daily. 60 tablet 3  . naproxen sodium (ANAPROX) 220 MG tablet Take 220 mg by mouth daily as needed.     No current facility-administered medications on file prior to visit.     Past Medical History  Diagnosis Date  . Hypertension   . Hypertension   . Obesity   . Chronic back pain   . Lipoma   . Arrhythmia   . Nephrolithiasis   . Diverticulitis   . Hyperlipidemia   . Asthma      Past Surgical History  Procedure Laterality Date  . Back surgery    . Ovary surgery    . Tumor removal       neck  . Knee surgery    . Achilles tendon surgery    . Knee arthroscopy w/ meniscal repair       Family History  Problem Relation Age of Onset  . Diabetes Mother      History   Social History  . Marital Status: Divorced    Spouse Name: N/A  . Number of Children: N/A  . Years of Education: N/A   Occupational History  . [employer]     Social History Main Topics  . Smoking status: Current Some Day Smoker -- 0.25 packs/day for 40 years    Types: Cigarettes  . Smokeless tobacco: Not on file     Comment: Used to smoke heavily, cut back in 2015  . Alcohol Use: 0.0 oz/week    0 Standard drinks or equivalent per week     Comment: socially, 1-2 drinks/week  . Drug Use: No  . Sexual Activity: Not on file   Other Topics Concern  . Not on file   Social History Narrative   Lives alone.     ROS A 10 point review of system was performed. It is negative other than that mentioned in the history of present illness.   PHYSICAL EXAM   BP 130/80 mmHg  Pulse 72  Ht 5\' 3"  (1.6 m)  Wt 231 lb 8 oz (105.008 kg)  BMI 41.02 kg/m2 Constitutional: She  is oriented to person, place, and time. She appears well-developed and well-nourished. No distress.  HENT: No nasal discharge.  Head: Normocephalic and atraumatic.  Eyes: Pupils are equal and round. No discharge.  Neck: Normal range of motion. Neck supple. No JVD present. No thyromegaly present.  Cardiovascular: Normal rate, regular rhythm, normal heart sounds. Exam reveals no gallop and no friction rub. No murmur heard.  Pulmonary/Chest: Effort normal and breath sounds normal. No stridor. No respiratory distress. She has no wheezes. She has no rales. She exhibits no tenderness.  Abdominal: Soft. Bowel sounds are normal. She exhibits no distension. There is no tenderness. There is no rebound and no guarding.  Musculoskeletal: Normal range of motion. She exhibits mild edema and no tenderness.  Neurological: She is alert and  oriented to person, place, and time. Coordination normal.  Skin: Skin is warm and dry. No rash noted. She is not diaphoretic. No erythema. No pallor.  Psychiatric: She has a normal mood and affect. Her behavior is normal. Judgment and thought content normal.       ASSESSMENT AND PLAN

## 2014-09-29 NOTE — Assessment & Plan Note (Signed)
This is likely the reason for her leg edema. I advised her to elevate her legs during the day. I also asked her to consider using knee-high support stockings during the day.

## 2014-09-29 NOTE — Patient Instructions (Signed)
Medication Instructions: Continue same medications.  You can use knee high support (compression) socks during the day.   Labwork: None.   Procedures/Testing: None.   Follow-Up: In 6 months.   Any Additional Special Instructions Will Be Listed Below (If Applicable).

## 2014-09-29 NOTE — Assessment & Plan Note (Signed)
Blood pressure is reasonably controlled on metoprolol.

## 2014-09-29 NOTE — Assessment & Plan Note (Signed)
No recurrent episodes since hydrochlorothiazide was discontinued. Continue treatment with metoprolol.

## 2015-04-10 ENCOUNTER — Other Ambulatory Visit: Payer: Self-pay

## 2015-04-10 MED ORDER — METOPROLOL TARTRATE 25 MG PO TABS: 25.0000 mg | ORAL_TABLET | Freq: Two times a day (BID) | ORAL | Status: DC

## 2015-04-18 ENCOUNTER — Telehealth: Payer: Self-pay | Admitting: Cardiovascular Disease

## 2015-04-18 NOTE — Telephone Encounter (Signed)
3 attempts to schedule fu from recall list.  lmov to schedule appt with office Deleting recall.

## 2015-09-20 ENCOUNTER — Other Ambulatory Visit: Payer: Self-pay | Admitting: Cardiovascular Disease

## 2015-10-24 ENCOUNTER — Other Ambulatory Visit: Payer: Self-pay | Admitting: Cardiovascular Disease

## 2016-05-31 ENCOUNTER — Encounter (HOSPITAL_COMMUNITY): Payer: Self-pay | Admitting: Nurse Practitioner

## 2016-05-31 ENCOUNTER — Emergency Department (HOSPITAL_COMMUNITY): Payer: Medicare HMO

## 2016-05-31 ENCOUNTER — Observation Stay (HOSPITAL_COMMUNITY)
Admission: EM | Admit: 2016-05-31 | Discharge: 2016-06-01 | Disposition: A | Discharge status: Home / Self Care | Payer: Medicare HMO | Attending: Cardiology | Admitting: Cardiology

## 2016-05-31 DIAGNOSIS — Z7901 Long term (current) use of anticoagulants: Secondary | ICD-10-CM | POA: Insufficient documentation

## 2016-05-31 DIAGNOSIS — F17210 Nicotine dependence, cigarettes, uncomplicated: Secondary | ICD-10-CM | POA: Insufficient documentation

## 2016-05-31 DIAGNOSIS — R42 Dizziness and giddiness: Secondary | ICD-10-CM

## 2016-05-31 DIAGNOSIS — R002 Palpitations: Secondary | ICD-10-CM

## 2016-05-31 DIAGNOSIS — G4733 Obstructive sleep apnea (adult) (pediatric): Secondary | ICD-10-CM | POA: Insufficient documentation

## 2016-05-31 DIAGNOSIS — I10 Essential (primary) hypertension: Secondary | ICD-10-CM | POA: Diagnosis present

## 2016-05-31 DIAGNOSIS — E119 Type 2 diabetes mellitus without complications: Secondary | ICD-10-CM | POA: Diagnosis not present

## 2016-05-31 DIAGNOSIS — Z7982 Long term (current) use of aspirin: Secondary | ICD-10-CM | POA: Diagnosis not present

## 2016-05-31 DIAGNOSIS — I441 Atrioventricular block, second degree: Secondary | ICD-10-CM | POA: Insufficient documentation

## 2016-05-31 DIAGNOSIS — I471 Supraventricular tachycardia: Secondary | ICD-10-CM | POA: Diagnosis present

## 2016-05-31 DIAGNOSIS — E669 Obesity, unspecified: Secondary | ICD-10-CM | POA: Insufficient documentation

## 2016-05-31 DIAGNOSIS — J45909 Unspecified asthma, uncomplicated: Secondary | ICD-10-CM | POA: Diagnosis not present

## 2016-05-31 DIAGNOSIS — Z833 Family history of diabetes mellitus: Secondary | ICD-10-CM | POA: Insufficient documentation

## 2016-05-31 DIAGNOSIS — R0602 Shortness of breath: Secondary | ICD-10-CM

## 2016-05-31 DIAGNOSIS — G8929 Other chronic pain: Secondary | ICD-10-CM | POA: Diagnosis not present

## 2016-05-31 DIAGNOSIS — Z6838 Body mass index (BMI) 38.0-38.9, adult: Secondary | ICD-10-CM | POA: Insufficient documentation

## 2016-05-31 DIAGNOSIS — E785 Hyperlipidemia, unspecified: Secondary | ICD-10-CM | POA: Insufficient documentation

## 2016-05-31 DIAGNOSIS — M549 Dorsalgia, unspecified: Secondary | ICD-10-CM | POA: Insufficient documentation

## 2016-05-31 DIAGNOSIS — I491 Atrial premature depolarization: Secondary | ICD-10-CM | POA: Insufficient documentation

## 2016-05-31 DIAGNOSIS — I480 Paroxysmal atrial fibrillation: Principal | ICD-10-CM | POA: Diagnosis present

## 2016-05-31 DIAGNOSIS — I4719 Other supraventricular tachycardia: Secondary | ICD-10-CM | POA: Diagnosis present

## 2016-05-31 DIAGNOSIS — I4891 Unspecified atrial fibrillation: Secondary | ICD-10-CM | POA: Diagnosis present

## 2016-05-31 DIAGNOSIS — R079 Chest pain, unspecified: Secondary | ICD-10-CM

## 2016-05-31 HISTORY — DX: Supraventricular tachycardia, unspecified: I47.10

## 2016-05-31 HISTORY — DX: Paroxysmal atrial fibrillation: I48.0

## 2016-05-31 HISTORY — DX: Supraventricular tachycardia: I47.1

## 2016-05-31 LAB — CBC WITH DIFFERENTIAL/PLATELET
Basophils Absolute: 0 10*3/uL (ref 0.0–0.1)
Basophils Relative: 0 %
EOS ABS: 0.2 10*3/uL (ref 0.0–0.7)
EOS PCT: 2 %
HCT: 45.1 % (ref 36.0–46.0)
HEMOGLOBIN: 14.7 g/dL (ref 12.0–15.0)
Lymphocytes Relative: 21 %
Lymphs Abs: 2 10*3/uL (ref 0.7–4.0)
MCH: 28 pg (ref 26.0–34.0)
MCHC: 32.6 g/dL (ref 30.0–36.0)
MCV: 85.9 fL (ref 78.0–100.0)
MONOS PCT: 6 %
Monocytes Absolute: 0.6 10*3/uL (ref 0.1–1.0)
Neutro Abs: 6.9 10*3/uL (ref 1.7–7.7)
Neutrophils Relative %: 71 %
PLATELETS: 246 10*3/uL (ref 150–400)
RBC: 5.25 MIL/uL — ABNORMAL HIGH (ref 3.87–5.11)
RDW: 13.3 % (ref 11.5–15.5)
WBC: 9.6 10*3/uL (ref 4.0–10.5)

## 2016-05-31 LAB — I-STAT TROPONIN, ED: TROPONIN I, POC: 0.01 ng/mL (ref 0.00–0.08)

## 2016-05-31 LAB — BASIC METABOLIC PANEL
Anion gap: 9 (ref 5–15)
BUN: 10 mg/dL (ref 6–20)
CHLORIDE: 109 mmol/L (ref 101–111)
CO2: 23 mmol/L (ref 22–32)
CREATININE: 0.96 mg/dL (ref 0.44–1.00)
Calcium: 9.5 mg/dL (ref 8.9–10.3)
GFR calc Af Amer: >60 mL/min (ref >60)
GFR calc non Af Amer: >60 mL/min (ref >60)
GLUCOSE: 114 mg/dL — AB (ref 65–99)
Potassium: 4.2 mmol/L (ref 3.5–5.1)
SODIUM: 141 mmol/L (ref 135–145)

## 2016-05-31 LAB — TSH: TSH: 1.602 u[IU]/mL (ref 0.350–4.500)

## 2016-05-31 MED ORDER — METOPROLOL TARTRATE 25 MG PO TABS
25.0000 mg | ORAL_TABLET | Freq: Once | ORAL | Status: AC
  Administered 2016-05-31: 25 mg via ORAL
  Filled 2016-05-31: qty 1

## 2016-05-31 MED ORDER — ASPIRIN 81 MG PO CHEW
324.0000 mg | CHEWABLE_TABLET | Freq: Once | ORAL | Status: DC
  Filled 2016-05-31: qty 4

## 2016-05-31 MED ORDER — SODIUM CHLORIDE 0.9 % IV SOLN: 250.0000 mL | INTRAVENOUS | Status: DC | PRN

## 2016-05-31 MED ORDER — ONDANSETRON HCL 4 MG/2ML IJ SOLN: 4.0000 mg | Freq: Four times a day (QID) | INTRAMUSCULAR | Status: DC | PRN

## 2016-05-31 MED ORDER — SODIUM CHLORIDE 0.9% FLUSH: 3.0000 mL | INTRAVENOUS | Status: DC | PRN

## 2016-05-31 MED ORDER — DEXTROSE 5 % IV SOLN
5.0000 mg/h | Freq: Once | INTRAVENOUS | Status: AC
  Administered 2016-05-31: 5 mg/h via INTRAVENOUS
  Filled 2016-05-31: qty 100

## 2016-05-31 MED ORDER — ACETAMINOPHEN 325 MG PO TABS: 650.0000 mg | ORAL_TABLET | ORAL | Status: DC | PRN

## 2016-05-31 MED ORDER — RIVAROXABAN 20 MG PO TABS: 20.0000 mg | ORAL_TABLET | Freq: Every day | ORAL | Status: DC

## 2016-05-31 MED ORDER — METOPROLOL SUCCINATE ER 50 MG PO TB24
50.0000 mg | ORAL_TABLET | Freq: Every day | ORAL | Status: DC
  Administered 2016-05-31 – 2016-06-01 (×2): 50 mg via ORAL
  Filled 2016-05-31 (×2): qty 1

## 2016-05-31 MED ORDER — DILTIAZEM HCL 25 MG/5ML IV SOLN
20.0000 mg | Freq: Once | INTRAVENOUS | Status: AC
  Administered 2016-05-31: 20 mg via INTRAVENOUS
  Filled 2016-05-31: qty 5

## 2016-05-31 MED ORDER — CLONAZEPAM 0.5 MG PO TABS
0.5000 mg | ORAL_TABLET | Freq: Two times a day (BID) | ORAL | Status: DC | PRN
  Administered 2016-05-31: 0.5 mg via ORAL
  Filled 2016-05-31: qty 1

## 2016-05-31 MED ORDER — SODIUM CHLORIDE 0.9% FLUSH
3.0000 mL | Freq: Two times a day (BID) | INTRAVENOUS | Status: DC
  Administered 2016-05-31: 3 mL via INTRAVENOUS

## 2016-05-31 NOTE — ED Provider Notes (Signed)
[facility] DEPT Provider Note  CSN: [id number] Arrival date & time: 05/31/16  1518  History   Chief Complaint Chief Complaint  Patient presents with  . Palpitations   HPI Tina Peters is a 66 y.o. female.  The history is provided by the patient and medical records. No language interpreter was used.  Illness  This is a recurrent problem. The current episode started 3 to 5 hours ago. The problem occurs constantly. The problem has not changed since onset.Associated symptoms include chest pain and shortness of breath. Pertinent negatives include no abdominal pain and no headaches. The symptoms are aggravated by exertion and walking. The symptoms are relieved by rest.    Past Medical History:  Diagnosis Date  . Arrhythmia   . Asthma   . Chronic back pain   . Diverticulitis   . Hyperlipidemia   . Hypertension   . Hypertension   . Lipoma   . Obesity   . Paroxysmal SVT (supraventricular tachycardia) [facility])    Patient Active Problem List   Diagnosis Date Noted  . PAC (premature atrial contraction)   . PAT (paroxysmal atrial tachycardia) ([place])   . Atrial fibrillation ([place])   . Chronic venous insufficiency 09/29/2014  . SVT (supraventricular tachycardia) ([place]) 07/15/2014  . Hypokalemia 07/15/2014  . Hypertension    Past Surgical History:  Procedure Laterality Date  . ACHILLES TENDON SURGERY    . BACK SURGERY    . KNEE ARTHROSCOPY W/ MENISCAL REPAIR    . KNEE SURGERY    . OVARY SURGERY    . TUMOR REMOVAL     neck   OB History    No data available      Home Medications    Prior to Admission medications   Medication Sig Start Date End Date Taking? Authorizing Provider  Aspirin-Caffeine (BC FAST PAIN RELIEF PO) Take by mouth as needed.   Yes Historical Provider, MD  clonazePAM (KLONOPIN) 0.5 MG tablet Take 0.5 mg by mouth at bedtime as needed. 06/22/14  Yes Historical Provider, MD  loratadine (CLARITIN) 10 MG tablet Take 10 mg by mouth daily.   Yes Historical  Provider, MD  metoprolol succinate (TOPROL-XL) 25 MG 24 hr tablet Take 25 mg by mouth daily.   Yes Historical Provider, MD  naproxen sodium (ANAPROX) 220 MG tablet Take 220 mg by mouth daily as needed.   Yes Historical Provider, MD  metoprolol tartrate (LOPRESSOR) 25 MG tablet TAKE ONE TABLET BY MOUTH TWICE DAILY 10/25/15   Wellington Hampshire, MD   Family History Family History  Problem Relation Age of Onset  . Diabetes Mother    Social History Social History  Substance Use Topics  . Smoking status: Current Some Day Smoker    Packs/day: 0.25    Years: 40.00    Types: Cigarettes  . Smokeless tobacco: Never Used     Comment: Used to smoke heavily, cut back in 2015  . Alcohol use 0.0 oz/week     Comment: socially, 1-2 drinks/week    Allergies   Patient has no known allergies.  Review of Systems Review of Systems  Constitutional: Negative for chills and fever.  Respiratory: Positive for shortness of breath. Negative for cough.   Cardiovascular: Positive for chest pain and palpitations.  Gastrointestinal: Negative for abdominal pain, diarrhea, nausea and vomiting.  Neurological: Positive for light-headedness. Negative for headaches.  All other systems reviewed and are negative.   Physical Exam Updated Vital Signs BP 152/68   Pulse 60   Temp  97.8 F (36.6 C) (Oral)   Resp 17   Ht 5\' 5"  (1.651 m)   Wt 103.9 kg   SpO2 100%   BMI 38.11 kg/m   Physical Exam  Constitutional: She is oriented to person, place, and time. No distress.  Obese elderly Caucasian female  HENT:  Head: Normocephalic and atraumatic.  Eyes: EOM are normal. Pupils are equal, round, and reactive to light.  Neck: Normal range of motion. Neck supple.  Cardiovascular: Normal heart sounds.  An irregularly irregular rhythm present. Tachycardia present.   Pulmonary/Chest: Effort normal and breath sounds normal.  Abdominal: Soft. Bowel sounds are normal. She exhibits no distension. There is no tenderness.    Musculoskeletal: Normal range of motion.  Neurological: She is alert and oriented to person, place, and time.  Skin: Skin is warm and dry. Capillary refill takes less than 2 seconds. She is not diaphoretic.  Nursing note and vitals reviewed.   ED Treatments / Results  Labs (all labs ordered are listed, but only abnormal results are displayed) Labs Reviewed  CBC WITH DIFFERENTIAL/PLATELET - Abnormal; Notable for the following:       Result Value   RBC 5.25 (*)    All other components within normal limits  BASIC METABOLIC PANEL - Abnormal; Notable for the following:    Glucose, Bld 114 (*)    All other components within normal limits  I-STAT TROPOININ, ED   EKG  EKG Interpretation None      Radiology Dg Chest Portable 1 View  Result Date: 05/31/2016 CLINICAL DATA:  Episodes of lightheadedness and dizziness beginning today. EXAM: PORTABLE CHEST 1 VIEW COMPARISON:  07/15/2014 FINDINGS: Cardiac silhouette is top-normal in size. No mediastinal or hilar masses. No convincing adenopathy. Clear lungs.  No pleural effusion or pneumothorax. Skeletal structures are demineralized but grossly intact. IMPRESSION: No active disease. Electronically Signed   By: Lajean Manes M.D.   On: 05/31/2016 16:03   Procedures Procedures (including critical care time)  Medications Ordered in ED Medications  aspirin chewable tablet 324 mg (0 mg Oral Hold 05/31/16 1601)  diltiazem (CARDIZEM) injection 20 mg (20 mg Intravenous Given 05/31/16 1630)  diltiazem (CARDIZEM) 100 mg in dextrose 5 % 100 mL (1 mg/mL) infusion (0 mg/hr Intravenous Stopped 05/31/16 1933)  metoprolol tartrate (LOPRESSOR) tablet 25 mg (25 mg Oral Given 05/31/16 1816)    Initial Impression / Assessment and Plan / ED Course  I have reviewed the triage vital signs and the nursing notes.  66 y.o. female with above stated PMHx, HPI, and physical. PMHx of obesity, HTN, HLD, & SVT. Symptoms onset this AM around 10:30am. Increased stress  recently. Symptoms including anxiety, palpitations, CP, SOB, lightheadedness.  Given ASA 324mg  en route. EKG showing SVT with frequent PAC's vs possible intermittent A-fib w/ RVR. Troponin not elevated. Pt without sustained response to valsalva. Given Dilt bolus & placed on gtt with improvement in HR to 80's. CXr showing nothing acute.  Laboratory and imaging results were personally reviewed by myself and used in the medical decision making of this patient's treatment and disposition.  Cardiology consult evaluated the patient in the emergency department with recommendations to admit for further observation maximization of medication regimen  Pt admitted to cardiology for further evaluation and management of uncontrolled SVT. Pt understands and agrees with the plan and has no further questions or concerns.   Pt care discussed with and followed by my attending, Dr. Dola Argyle, MD Pager [phone]  Final Clinical Impressions(s) /  ED Diagnoses   Final diagnoses:  SVT (supraventricular tachycardia) (HCC)  PAC (premature atrial contraction)  Palpitations  Lightheadedness  Left sided chest pain  SOB (shortness of breath)   New Prescriptions New Prescriptions   No medications on file     Mayer Camel, MD 05/31/16 [place], MD 06/03/16 [phone]

## 2016-05-31 NOTE — ED Triage Notes (Signed)
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 Patient got real dizzy while driving to work around 2pm and thought she was going to pass out. Coworkers drove patient to fire department who tried to get patient to vagal down. Patients HR was in the 80's at baseline but jumped up into SVT (HR 150-200) multiple times for episodes around 5-10 seconds. Patient also has hx of anxiety that begun to accompany episodes.  Patient has had a similar issue before where provider increased BP medication. Patient feels this is related to anxiety. Pt denies CP and ShOB with these episodes.

## 2016-05-31 NOTE — ED Notes (Signed)
Spoke with admitting regarding heart rate 58-62.  Requested that cardizem be stopped at this time.  Order carried out.

## 2016-05-31 NOTE — H&P (Signed)
Physician History and Physical    Patient ID: Tina Peters MRN: [id number] DOB/AGE: 1950-09-01 66 y.o. Admit date: 05/31/2016  Primary Care Physician: Donnie Coffin, MD Primary Cardiologist Dr. Fletcher Anon  HPI: 20 yoWF with history of HTN, HL, borderline DM, and PAT. She presents to ED tonight with complaints of palpitations, tachycardia, dizziness and heart pounding. States this is similar to 2016 when she was seen in ED with PAT. Started on diltiazem but developed some ankle swelling so was switched to metoprolol 25 mg bid. Later changed to Toprol XL 25 mg daily. She describes intermittent symptoms of palpitations at home and at work. Lasts 2-3 hours then settles down. Today heart was "flying" and she felt lightheaded and like she might pass out. EMS did 3 Ecgs which showed Afib with RVR rate up to 150-170. Started on IV diltiazem in ED- now in NSR with PACs. Patient drinks one cup of coffee and one soda daily. Takes BC powders regularly. Echo in April 2016 was normal. Uses CPAP at home but has never had a sleep study or CPAP titration.  Review of systems complete and found to be negative unless listed above  Past Medical History:  Diagnosis Date  . Arrhythmia   . Asthma   . Chronic back pain   . Diverticulitis   . Hyperlipidemia   . Hypertension   . Hypertension   . Lipoma   . Obesity   . Paroxysmal SVT (supraventricular tachycardia) (HCC)     Family History  Problem Relation Age of Onset  . Diabetes Mother     Social History   Social History  . Marital status: Divorced    Spouse name: N/A  . Number of children: N/A  . Years of education: N/A   Occupational History  . [employer]     Social History Main Topics  . Smoking status: Current Some Day Smoker    Packs/day: 0.25    Years: 40.00    Types: Cigarettes  . Smokeless tobacco: Never Used     Comment: Used to smoke heavily, cut back in 2015  . Alcohol use 0.0 oz/week     Comment: socially, 1-2 drinks/week  .  Drug use: No  . Sexual activity: Not on file   Other Topics Concern  . Not on file   Social History Narrative   Lives alone.    Past Surgical History:  Procedure Laterality Date  . ACHILLES TENDON SURGERY    . BACK SURGERY    . KNEE ARTHROSCOPY W/ MENISCAL REPAIR    . KNEE SURGERY    . OVARY SURGERY    . TUMOR REMOVAL     neck    Medication: Toprol XL 25 mg daily Naproxen, Ibuprofen, Claritin prn BC powder prn Klonopin qhs prn.  Physical Exam: Blood pressure 130/89, pulse 62, resp. rate 18, height 5\' 5"  (1.651 m), weight 229 lb (103.9 kg), SpO2 100 %. Current Weight  05/31/16 229 lb (103.9 kg)  09/29/14 231 lb 8 oz (105 kg)  08/02/14 226 lb 8 oz (102.7 kg)    GENERAL:  Well appearing, obese WF in NAD HEENT:  PERRL, EOMI, sclera are clear. Oropharynx is clear. NECK:  No jugular venous distention, carotid upstroke brisk and symmetric, no bruits, no thyromegaly or adenopathy LUNGS:  Clear to auscultation bilaterally CHEST:  Unremarkable HEART:  RRR,  PMI not displaced or sustained,S1 and S2 within normal limits, no S3, no S4: no clicks, no rubs, no murmurs ABD:  Soft, nontender.  BS +, no masses or bruits. No hepatomegaly, no splenomegaly EXT:  2 + pulses throughout, no edema, no cyanosis no clubbing SKIN:  Warm and dry.  No rashes NEURO:  Alert and oriented x 3. Cranial nerves II through XII intact. PSYCH:  Cognitively intact   Labs:   Lab Results  Component Value Date   WBC 9.6 05/31/2016   HGB 14.7 05/31/2016   HCT 45.1 05/31/2016   MCV 85.9 05/31/2016   PLT 246 05/31/2016    Recent Labs Lab 05/31/16 1559  NA 141  K 4.2  CL 109  CO2 23  BUN 10  CREATININE 0.96  CALCIUM 9.5  GLUCOSE 114*   No results found for: CKMB, CKMBINDEX, TROPONINI No results found for: CHOL No results found for: HDL No results found for: LDLCALC No results found for: TRIG No results found for: CHOLHDL No results found for: LDLDIRECT  No results found for: PROBNP Lab Results   Component Value Date   TSH 2.682 07/15/2014   No results found for: HGBA1C  Radiology: Dg Chest Portable 1 View  Result Date: 05/31/2016 CLINICAL DATA:  Episodes of lightheadedness and dizziness beginning today. EXAM: PORTABLE CHEST 1 VIEW COMPARISON:  07/15/2014 FINDINGS: Cardiac silhouette is top-normal in size. No mediastinal or hilar masses. No convincing adenopathy. Clear lungs.  No pleural effusion or pneumothorax. Skeletal structures are demineralized but grossly intact. IMPRESSION: No active disease. Electronically Signed   By: Lajean Manes M.D.   On: 05/31/2016 16:03    EKG: in ED shows NSR with runs of PAT EMS Ecg x 3 reviewed and shows Afib with RVR  ASSESSMENT AND PLAN:  1. Afib with RVR. Now converted to NSR. [place] Vasc score of 3. Recommend anticoagulation with NOAC. Will increase Toprol XL to 50 mg daily. DC IV cardizem. Avoid all caffeine products. With NOAC will need to discontinue ASA products and NSAIDS.  2. History of PAT 3. HTN 4. OSA by history. No formal sleep evaluation or CPAP titration. Need to arrange as outpatient.   Plan: observe overnight on telemetry. If rhythm stable can DC tomorrow with outpatient follow up.  Signed: Ixel Boehning [place], [place]  05/31/2016, 6:13 PM

## 2016-05-31 NOTE — ED Notes (Signed)
Pts HR continued to fluctuate with readings up to 200. EDP made aware. Zoll pads applied to patient's chest. Pharmacy called to send down Cardizem drip. Per pharmacy it has been sent.

## 2016-05-31 NOTE — Progress Notes (Signed)
Pt. placed on CPAP with small nasal mask and room air per home regimen, humidity filled and tolerating well, RT to monitor.

## 2016-05-31 NOTE — ED Notes (Signed)
Attempted report 

## 2016-06-01 ENCOUNTER — Encounter (HOSPITAL_COMMUNITY): Payer: Self-pay | Admitting: Student

## 2016-06-01 DIAGNOSIS — I480 Paroxysmal atrial fibrillation: Secondary | ICD-10-CM | POA: Diagnosis not present

## 2016-06-01 LAB — LIPID PANEL
CHOLESTEROL: 194 mg/dL (ref 0–200)
HDL: 36 mg/dL — ABNORMAL LOW (ref >40)
LDL CALC: 135 mg/dL — AB (ref 0–99)
TRIGLYCERIDES: 117 mg/dL (ref <150)
Total CHOL/HDL Ratio: 5.4 RATIO
VLDL: 23 mg/dL (ref 0–40)

## 2016-06-01 LAB — TROPONIN I

## 2016-06-01 MED ORDER — METOPROLOL SUCCINATE ER 50 MG PO TB24: 50.0000 mg | ORAL_TABLET | Freq: Every day | ORAL | 6 refills | Status: DC

## 2016-06-01 MED ORDER — RIVAROXABAN 20 MG PO TABS: 20.0000 mg | ORAL_TABLET | Freq: Every day | ORAL | 0 refills | Status: DC

## 2016-06-01 MED ORDER — RIVAROXABAN 20 MG PO TABS: 20.0000 mg | ORAL_TABLET | Freq: Every day | ORAL | 11 refills | Status: DC

## 2016-06-01 NOTE — Discharge Summary (Signed)
Discharge Summary    Patient ID: Tina Peters,  MRN: [id number], DOB/AGE: 66-Mar-1952 66 y.o.  Admit date: 05/31/2016 Discharge date: 06/01/2016  Primary Care Provider: Tomasa Hose A Primary Cardiologist: Dr. Fletcher Anon  Discharge Diagnoses    Principal Problem:   Atrial fibrillation [facility]) Active Problems:   Hypertension   PAT (paroxysmal atrial tachycardia) ([place])   History of Present Illness     Tina Peters is a 66 y.o. female with past medical history of HTN, HLD and PAT who presented to [facility] ED on 05/31/2016 for evaluation of palpitations, tachycardia, and dizziness.   Reports her symptoms were similar to when she experienced PAT in 2016. Had been started on Diltiazem at that time but was switched to Lopressor secondary to lower extremity edema.   Had been doing well until the day of presentation when she reported feeling like she was going to pass out. Denied any syncopal episodes. Initial EKG showed atrial fibrillation with RVR, HR in the 150's - 170's. She was started on IV Diltiazem and experienced conversion to NSR. Her Toprol-XL was increased from 25mg  daily to 50mg  daily and she was admitted for further observation.   Hospital Course     Consultants: None  The following morning, she denied any repeat episodes of chest pain, palpitations, or presyncope. Telemetry showed she was maintaining NSR with occasional PAC's. TSH WNL. Cyclic troponin values remained negative. With her CHA2DS2-VASc Score of 3, she was started on anticoagulation with Xarelto 20mg  daily (30-day card and printed Rx provided).   She was last examined by Dr. Stanford Breed and deemed stable for discharge. She will need a repeat echocardiogram as an outpatient and CBC/BMET in 4 weeks. A staff message has been sent to the [facility] to arrange follow-up. She was discharged home in good condition.  _____________  Discharge Vitals Blood pressure (!) 148/61, pulse 65, temperature 97.8 F (36.6  C), temperature source Oral, resp. rate 20, height 5\' 5"  (1.651 m), weight 224 lb 14.4 oz (102 kg), SpO2 98 %.  Filed Weights   05/31/16 1526 05/31/16 2000  Weight: 229 lb (103.9 kg) 224 lb 14.4 oz (102 kg)    Labs & Radiologic Studies     CBC  Recent Labs  05/31/16 1559  WBC 9.6  NEUTROABS 6.9  HGB 14.7  HCT 45.1  MCV 85.9  PLT 0000000   Basic Metabolic Panel  Recent Labs  05/31/16 1559  NA 141  K 4.2  CL 109  CO2 23  GLUCOSE 114*  BUN 10  CREATININE 0.96  CALCIUM 9.5   Liver Function Tests No results for input(s): AST, ALT, ALKPHOS, BILITOT, PROT, ALBUMIN in the last 72 hours. No results for input(s): LIPASE, AMYLASE in the last 72 hours. Cardiac Enzymes  Recent Labs  06/01/16 0232 06/01/16 0901  TROPONINI <0.03 <0.03   BNP Invalid input(s): POCBNP D-Dimer No results for input(s): DDIMER in the last 72 hours. Hemoglobin A1C No results for input(s): HGBA1C in the last 72 hours. Fasting Lipid Panel  Recent Labs  06/01/16 0232  CHOL 194  HDL 36*  LDLCALC 135*  TRIG 117  CHOLHDL 5.4   Thyroid Function Tests  Recent Labs  05/31/16 2100  TSH 1.602    Dg Chest Portable 1 View  Result Date: 05/31/2016 CLINICAL DATA:  Episodes of lightheadedness and dizziness beginning today. EXAM: PORTABLE CHEST 1 VIEW COMPARISON:  07/15/2014 FINDINGS: Cardiac silhouette is top-normal in size. No mediastinal or hilar masses. No convincing  adenopathy. Clear lungs.  No pleural effusion or pneumothorax. Skeletal structures are demineralized but grossly intact. IMPRESSION: No active disease. Electronically Signed   By: Lajean Manes M.D.   On: 05/31/2016 16:03     Diagnostic Studies/Procedures    None Performed.   Disposition   Pt is being discharged home today in good condition.  Follow-up Plans & Appointments    Follow-up Information    [name], MD Follow up.   Specialty:  Cardiology Why:  The office will call you within 2-3 business days to  arrange follow-up. If you do not hear from them, please call the number provided.  Contact information: [place] [address] [phone]            Discharge Medications     Medication List    STOP taking these medications   BC FAST PAIN RELIEF PO   metoprolol tartrate 25 MG tablet Commonly known as:  LOPRESSOR   naproxen sodium 220 MG tablet Commonly known as:  ANAPROX     TAKE these medications   clonazePAM 0.5 MG tablet Commonly known as:  KLONOPIN Take 0.5 mg by mouth at bedtime as needed.   loratadine 10 MG tablet Commonly known as:  CLARITIN Take 10 mg by mouth daily.   metoprolol succinate 50 MG 24 hr tablet Commonly known as:  TOPROL-XL Take 1 tablet (50 mg total) by mouth daily. What changed:  medication strength  how much to take   rivaroxaban 20 MG Tabs tablet Commonly known as:  XARELTO Take 1 tablet (20 mg total) by mouth daily with supper.         Allergies No Known Allergies   Outstanding Labs/Studies   Echo as Outpatient; CBC and BMET in 4 weeks.  Duration of Discharge Encounter   Greater than 30 minutes including physician time.  Signed, Erma Heritage, PA-C 06/01/2016, 11:14 AM

## 2016-06-01 NOTE — Progress Notes (Addendum)
Progress Note  Patient Name: Tina Peters Date of Encounter: 06/01/2016  Primary Cardiologist: Dr Fletcher Anon  Subjective   No chest pain or dyspnea  Inpatient Medications    Scheduled Meds: . metoprolol succinate  50 mg Oral Daily  . rivaroxaban  20 mg Oral Q supper  . sodium chloride flush  3 mL Intravenous Q12H   Continuous Infusions:  PRN Meds: sodium chloride, acetaminophen, clonazePAM, ondansetron (ZOFRAN) IV, sodium chloride flush   Vital Signs    Vitals:   05/31/16 1930 05/31/16 1944 05/31/16 2000 06/01/16 0346  BP: 152/68  (!) 164/67 125/60  Pulse: 60  60 (!) 102  Resp: 17  18 20   Temp:  97.8 F (36.6 C) 98 F (36.7 C) 97.8 F (36.6 C)  TempSrc:  Oral Oral Oral  SpO2: 100%  99% 98%  Weight:   224 lb 14.4 oz (102 kg)   Height:   5\' 5"  (1.651 m)     Intake/Output Summary (Last 24 hours) at 06/01/16 0946 Last data filed at 05/31/16 2007  Gross per 24 hour  Intake              240 ml  Output                0 ml  Net              240 ml   Filed Weights   05/31/16 1526 05/31/16 2000  Weight: 229 lb (103.9 kg) 224 lb 14.4 oz (102 kg)    Telemetry    Sinus with pacs- Personally Reviewed  Physical Exam   GEN: No acute distress.  Neck: No JVD Cardiac: RRR Respiratory: CTA GI: Soft, nontender, non-distended  MS: No edema; No deformity. Neuro:  AAOx3. Psych: Normal affect  Labs    Chemistry Recent Labs Lab 05/31/16 1559  NA 141  K 4.2  CL 109  CO2 23  GLUCOSE 114*  BUN 10  CREATININE 0.96  CALCIUM 9.5  GFRNONAA >60  GFRAA >60  ANIONGAP 9     Hematology Recent Labs Lab 05/31/16 1559  WBC 9.6  RBC 5.25*  HGB 14.7  HCT 45.1  MCV 85.9  MCH 28.0  MCHC 32.6  RDW 13.3  PLT 246    Cardiac Enzymes Recent Labs Lab 06/01/16 0232  TROPONINI <0.03    Recent Labs Lab 05/31/16 1612  TROPIPOC 0.01     BNPNo results for input(s): BNP, PROBNP in the last 168 hours.   DDimer No results for input(s): DDIMER in the last 168  hours.   Radiology    Dg Chest Portable 1 View  Result Date: 05/31/2016 CLINICAL DATA:  Episodes of lightheadedness and dizziness beginning today. EXAM: PORTABLE CHEST 1 VIEW COMPARISON:  07/15/2014 FINDINGS: Cardiac silhouette is top-normal in size. No mediastinal or hilar masses. No convincing adenopathy. Clear lungs.  No pleural effusion or pneumothorax. Skeletal structures are demineralized but grossly intact. IMPRESSION: No active disease. Electronically Signed   By: Lajean Manes M.D.   On: 05/31/2016 16:03    Patient Profile     66 y.o. female with past medical history of hypertension, diabetes mellitus and paroxysmal atrial tachycardia admitted with atrial fibrillation. Patient converted to sinus rhythm with Cardizem.  Assessment & Plan    1 paroxysmal atrial fibrillation-patient remains in sinus rhythm this morning. Continue higher dose of Toprol to help with rate control if atrial fibrillation recurs. If she has more frequent episodes in the future would need antiarrhythmic. CHADSvasc  3. Continue xarelto 20 mg daily; check echo as outpt. Check Hgb and renal function in 4 weeks. TSH normal.  2 Htn-BP controlled; continue present meds.  FU Dr Fletcher Anon in [place] 2 - 4 weeks after DC. > 30 min PA and physician time D2  Signed, Kirk Ruths, MD  06/01/2016, 9:46 AM

## 2016-06-03 ENCOUNTER — Telehealth: Payer: Self-pay | Admitting: Cardiovascular Disease

## 2016-06-03 NOTE — Telephone Encounter (Signed)
Patient contacted regarding discharge from [facility] on January 27.  Patient understands to follow up with provider Dr. Fletcher Anon on Feb 16 at [place] at [facility], [place]. Patient understands discharge instructions? yes Patient understands medications and regiment? yes Patient understands to bring all medications to this visit? yes  Pt originally scheduled to see Christell Faith, PA-C on Feb 12 but preferred a morning appt.     Started xarelto yesterday, January 28.

## 2016-06-17 ENCOUNTER — Encounter: Payer: Medicare HMO | Admitting: Physician Assistant

## 2016-06-21 ENCOUNTER — Ambulatory Visit (INDEPENDENT_AMBULATORY_CARE_PROVIDER_SITE_OTHER): Payer: Medicare HMO | Admitting: Cardiovascular Disease

## 2016-06-21 ENCOUNTER — Encounter: Payer: Self-pay | Admitting: Cardiovascular Disease

## 2016-06-21 VITALS — BP 160/88 | HR 75 | Ht 65.0 in | Wt 226.5 lb

## 2016-06-21 DIAGNOSIS — I10 Essential (primary) hypertension: Secondary | ICD-10-CM

## 2016-06-21 DIAGNOSIS — I480 Paroxysmal atrial fibrillation: Secondary | ICD-10-CM | POA: Diagnosis not present

## 2016-06-21 NOTE — Patient Instructions (Signed)
Medication Instructions:  Your physician recommends that you continue on your current medications as directed. Please refer to the Current Medication list given to you today.   Labwork: BMET and CBC when you return for your echo  Testing/Procedures: Your physician has requested that you have an echocardiogram. Echocardiography is a painless test that uses sound waves to create images of your heart. It provides your doctor with information about the size and shape of your heart and how well your heart's chambers and valves are working. This procedure takes approximately one hour. There are no restrictions for this procedure.    Follow-Up: Your physician recommends that you schedule a follow-up appointment in: two months with Dr. Fletcher Anon.    Any Other Special Instructions Will Be Listed Below (If Applicable).     If you need a refill on your cardiac medications before your next appointment, please call your pharmacy.
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
## 2016-07-04 ENCOUNTER — Telehealth: Payer: Self-pay

## 2016-07-04 NOTE — Telephone Encounter (Signed)
Pt would like Xarelto samples.  

## 2016-07-04 NOTE — Telephone Encounter (Signed)
Patient notified samples are available to pick up of Xarelto 20 mg tablets.

## 2016-07-17 ENCOUNTER — Other Ambulatory Visit: Payer: Medicare HMO

## 2016-07-17 ENCOUNTER — Telehealth: Payer: Self-pay | Admitting: Cardiovascular Disease

## 2016-07-17 NOTE — Telephone Encounter (Signed)
No answer. Left message to call back.   

## 2016-07-17 NOTE — Telephone Encounter (Signed)
Pt states when she was in last, we only gave her a month's supply on her Xarelto  She is wanting to know if she needs to stay on this  Please advise

## 2016-07-18 NOTE — Telephone Encounter (Signed)
No answer. Left message to call back.   

## 2016-07-19 NOTE — Telephone Encounter (Signed)
Medication Samples have been provided to the patient.  Drug name: Xarelto       Strength: 20 mg        Qty: 2 bottles  LOT: [id number]  Exp.Date: 4/20  Dosing instructions: Once daily with supper.

## 2016-07-19 NOTE — Telephone Encounter (Signed)
Spoke with patient regarding her xarelto and she states that she is just not able to afford it. Let her know that I can place some samples up front for her to pick up and application for assistance. She was appreciative for the call and had no further questions at this time.

## 2016-08-15 ENCOUNTER — Telehealth: Payer: Self-pay | Admitting: *Deleted

## 2016-08-15 ENCOUNTER — Other Ambulatory Visit: Payer: Self-pay | Admitting: *Deleted

## 2016-08-15 ENCOUNTER — Other Ambulatory Visit (INDEPENDENT_AMBULATORY_CARE_PROVIDER_SITE_OTHER): Payer: Medicare HMO | Admitting: *Deleted

## 2016-08-15 ENCOUNTER — Other Ambulatory Visit: Payer: Self-pay

## 2016-08-15 ENCOUNTER — Ambulatory Visit (INDEPENDENT_AMBULATORY_CARE_PROVIDER_SITE_OTHER): Payer: Medicare HMO

## 2016-08-15 DIAGNOSIS — I480 Paroxysmal atrial fibrillation: Secondary | ICD-10-CM | POA: Diagnosis not present

## 2016-08-15 MED ORDER — RIVAROXABAN 20 MG PO TABS
20.0000 mg | ORAL_TABLET | Freq: Every day | ORAL | 11 refills | Status: DC
Start: 2016-08-15 — End: 2020-03-02

## 2016-08-15 NOTE — Telephone Encounter (Signed)
Pt's application has been placed in Pt Pick up box.

## 2016-08-15 NOTE — Telephone Encounter (Signed)
Patient assistance application HCP section have been complete in MD box for signature.

## 2016-08-15 NOTE — Telephone Encounter (Signed)
LMOVM to inform pt that she did not complete her Patient assistance app. Section income/signature. We can't submit uncompleted application until they are properly filled out.

## 2016-08-16 ENCOUNTER — Other Ambulatory Visit: Payer: Self-pay

## 2016-08-16 LAB — CBC
Hematocrit: 40.7 % (ref 34.0–46.6)
Hemoglobin: 13.3 g/dL (ref 11.1–15.9)
MCH: 27.8 pg (ref 26.6–33.0)
MCHC: 32.7 g/dL (ref 31.5–35.7)
MCV: 85 fL (ref 79–97)
Platelets: 254 10*3/uL (ref 150–379)
RBC: 4.78 x10E6/uL (ref 3.77–5.28)
RDW: 13.7 % (ref 12.3–15.4)
WBC: 9 10*3/uL (ref 3.4–10.8)

## 2016-08-16 LAB — BASIC METABOLIC PANEL
BUN / CREAT RATIO: 13 (ref 12–28)
BUN: 14 mg/dL (ref 8–27)
CO2: 23 mmol/L (ref 18–29)
CREATININE: 1.08 mg/dL — AB (ref 0.57–1.00)
Calcium: 9.5 mg/dL (ref 8.7–10.3)
Chloride: 103 mmol/L (ref 96–106)
GFR calc Af Amer: 62 mL/min/{1.73_m2} (ref >59)
GFR calc non Af Amer: 54 mL/min/{1.73_m2} — ABNORMAL LOW (ref >59)
GLUCOSE: 107 mg/dL — AB (ref 65–99)
Potassium: 4.5 mmol/L (ref 3.5–5.2)
Sodium: 142 mmol/L (ref 134–144)

## 2016-08-16 MED ORDER — METOPROLOL SUCCINATE ER 100 MG PO TB24: 100.0000 mg | ORAL_TABLET | Freq: Every day | ORAL | 3 refills | Status: DC

## 2016-08-26 ENCOUNTER — Telehealth: Payer: Self-pay | Admitting: Cardiovascular Disease

## 2016-08-26 NOTE — Telephone Encounter (Signed)
Per pt request, faxed Patient Assistance Program application for xarelto to [facility], [phone]

## 2016-09-06 ENCOUNTER — Ambulatory Visit (INDEPENDENT_AMBULATORY_CARE_PROVIDER_SITE_OTHER): Payer: Medicare HMO | Admitting: Cardiovascular Disease

## 2016-09-06 ENCOUNTER — Encounter: Payer: Self-pay | Admitting: Cardiovascular Disease

## 2016-09-06 VITALS — BP 120/82 | HR 58 | Ht 65.0 in | Wt 225.0 lb

## 2016-09-06 DIAGNOSIS — I10 Essential (primary) hypertension: Secondary | ICD-10-CM

## 2016-09-06 DIAGNOSIS — I480 Paroxysmal atrial fibrillation: Secondary | ICD-10-CM

## 2016-09-06 NOTE — Progress Notes (Signed)
Cardiology Office Note   Date:  09/06/2016   ID:  Tina Peters, DOB March 25, 1951, MRN [id number]  PCP:  Donnie Coffin, MD  Cardiologist:   [name], MD   Chief Complaint  Patient presents with  . other    2 month follow up. Meds reviewed by the pt. verbally. Pt. c/o shortness of breath with over exertion.       History of Present Illness: Tina Peters is a 66 y.o. female who presents fora follow-up visit regarding paroxysmal supraventricular tachycardia and recent A-fib. She has known history of hypertension, hyperlipidemia and tobacco use. There is no family history of premature coronary artery disease. She had an episode of supraventricular tachycardia in 2016 in the setting of hypokalemia thought to be due to treatment with hydrochlorothiazide. She was placed on small dose extended release diltiazem with improvement in symptoms . It was switched to metoprolol 25 mg twice daily due to leg edema.  Echocardiogram in 2016 showed normal LV systolic function, mild to moderate mitral regurgitation and mildly dilated left atrium. She has known history of sleep apnea on CPAP. She presented to [facility] in January with palpitations and was found to have atrial fibrillation with rapid ventricular response. Heart rate was between 150-170 bpm. She converted to sinus rhythm with diltiazem. The patient was started on Xarelto for anticoagulation. The dose of Toprol was increased.  She underwent an echocardiogram which showed normal LV systolic function with an EF of 55-60%, mild mitral regurgitation and moderately dilated left atrium. She was noted to be in atrial fibrillation at the beginning of the study with a heart rate of 156 bpm. The dose of Toprol was increased further to 100 mg once daily. She has been doing reasonably well and reports improvement in palpitations. She reports stable exertional dyspnea. She has noticed being more tired since the dose of Toprol was increased.    Past  Medical History:  Diagnosis Date  . Arrhythmia   . Asthma   . Chronic back pain   . Diverticulitis   . Hyperlipidemia   . Hypertension   . Hypertension   . Lipoma   . Obesity   . PAF (paroxysmal atrial fibrillation) ([place])    a. diagnosed in 05/2016. Started on Xarelto  . Paroxysmal SVT (supraventricular tachycardia) ([facility])     Past Surgical History:  Procedure Laterality Date  . ACHILLES TENDON SURGERY    . BACK SURGERY    . KNEE ARTHROSCOPY W/ MENISCAL REPAIR    . KNEE SURGERY    . OVARY SURGERY    . TUMOR REMOVAL     neck     Current Outpatient Prescriptions  Medication Sig Dispense Refill  . acetaminophen (TYLENOL) 325 MG tablet Take 650 mg by mouth daily.    . citalopram (CELEXA) 20 MG tablet Take 20 mg by mouth daily.    . clonazePAM (KLONOPIN) 0.5 MG tablet Take 0.5 mg by mouth at bedtime as needed.    . loratadine (CLARITIN) 10 MG tablet Take 10 mg by mouth daily.    . metoprolol succinate (TOPROL-XL) 100 MG 24 hr tablet Take 1 tablet (100 mg total) by mouth daily. 30 tablet 3  . rivaroxaban (XARELTO) 20 MG TABS tablet Take 1 tablet (20 mg total) by mouth daily with supper. 30 tablet 11   No current facility-administered medications for this visit.     Allergies:   Patient has no known allergies.    Social History:  The patient  reports that she has been smoking Cigarettes.  She has a 10.00 pack-year smoking history. She has never used smokeless tobacco. She reports that she drinks alcohol. She reports that she does not use drugs.   Family History:  The patient's family history includes Diabetes in her mother.    ROS:  Please see the history of present illness.   Otherwise, review of systems are positive for none.   All other systems are reviewed and negative.    PHYSICAL EXAM: VS:  BP 120/82 (BP Location: Left Arm, Patient Position: Sitting, Cuff Size: Normal)   Pulse (!) 58   Ht 5\' 5"  (1.651 m)   Wt 225 lb (102.1 kg)   BMI 37.44 kg/m  , BMI Body mass  index is 37.44 kg/m. GEN: Well nourished, well developed, in no acute distress  HEENT: normal  Neck: no JVD, carotid bruits, or masses Cardiac: RRR; no murmurs, rubs, or gallops,no edema  Respiratory:  clear to auscultation bilaterally, normal work of breathing GI: soft, nontender, nondistended, + BS MS: no deformity or atrophy  Skin: warm and dry, no rash Neuro:  Strength and sensation are intact Psych: euthymic mood, full affect   EKG:  EKG is ordered today. The ekg ordered today demonstrates sinus bradycardia with no significant ST or T wave changes.  Recent Labs: 05/31/2016: Hemoglobin 14.7; TSH 1.602 08/15/2016: BUN 14; Creatinine, Ser 1.08; Platelets 254; Potassium 4.5; Sodium 142    Lipid Panel    Component Value Date/Time   CHOL 194 06/01/2016 0232   TRIG 117 06/01/2016 0232   HDL 36 (L) 06/01/2016 0232   CHOLHDL 5.4 06/01/2016 0232   VLDL 23 06/01/2016 0232   LDLCALC 135 (H) 06/01/2016 0232      Wt Readings from Last 3 Encounters:  09/06/16 225 lb (102.1 kg)  06/21/16 226 lb 8 oz (102.7 kg)  05/31/16 224 lb 14.4 oz (102 kg)       No flowsheet data found.    ASSESSMENT AND PLAN:  1.Paroxysmal atrial fibrillation: Palpitations improved after increasing the dose of Toprol to 100 mg once daily. If she develops recurrent atrial fibrillation or does not tolerate metoprolol well, we can consider an antiarrhythmic medication or referral for ablation. Continue long-term anticoagulation given CHADS VASc score of 3.  2. Essential hypertension: Blood pressure is  controlled.  3. Sleep apnea: Currently using CPAP.  4. Anxiety: Improved with citalopram.  Disposition:   FU with me in 6 months  Signed,  [name], MD  09/06/2016 3:35 PM    [place]

## 2016-09-06 NOTE — Patient Instructions (Signed)
Medication Instructions:  Your physician recommends that you continue on your current medications as directed. Please refer to the Current Medication list given to you today.   Labwork: none  Testing/Procedures: none  Follow-Up: Your physician wants you to follow-up in: 6 months with Dr. Fletcher Anon.  You will receive a reminder letter in the mail two months in advance. If you don't receive a letter, please call our office to schedule the follow-up appointment.   Any Other Special Instructions Will Be Listed Below (If Applicable).  Please call [facility] Patient Assistance, [phone], to check on the status of xarelto approval.  Samples of this drug were given to the patient, quantity 2 bottles, Lot Number [id number] exp: 4/20   If you need a refill on your cardiac medications before your next appointment, please call your pharmacy.

## 2016-09-09 ENCOUNTER — Telehealth: Payer: Self-pay | Admitting: Cardiovascular Disease

## 2016-09-09 NOTE — Telephone Encounter (Signed)
Received fax from [facility] patient assistance with xarelto. Two pages of pt's personal information was not included in the fax. I have left a message on pt's home VM to contact the office. She may pick up this information for completion and we can re-fax.

## 2016-09-11 NOTE — Telephone Encounter (Signed)
Left message on pt's home VM to contact the office.

## 2016-09-12 NOTE — Telephone Encounter (Signed)
Pt called back. Will pick up patient assistance form and complete personal info. Information left at front desk.

## 2016-09-25 NOTE — Telephone Encounter (Signed)
Xarelto 20 mg tablet samples placed at front desk for pick up.

## 2016-09-25 NOTE — Telephone Encounter (Signed)
Patient calling the office for samples of medication:   1.  What medication and dosage are you requesting samples for?  xarelto 20 mg po daily   2.  Are you currently out of this medication?  yes   Patient is working on assistance form .

## 2016-09-26 NOTE — Telephone Encounter (Signed)
Placed assistance forms [employer] in ma box

## 2016-09-26 NOTE — Telephone Encounter (Signed)
Patient assistance forms placed in RN bin.

## 2016-10-04 ENCOUNTER — Telehealth: Payer: Self-pay | Admitting: Cardiovascular Disease

## 2016-10-04 NOTE — Telephone Encounter (Signed)
Faxed patient assistance for for xarelto to [place], Attn: [employer], [phone]

## 2016-10-09 ENCOUNTER — Telehealth: Payer: Self-pay | Admitting: Cardiovascular Disease

## 2016-10-09 NOTE — Telephone Encounter (Signed)
[place] faxed back one page of pt assistance application needing signature and date. Left message on pt's home VM. Form placed in envelope at front desk for pt completion.

## 2016-10-10 ENCOUNTER — Telehealth: Payer: Self-pay | Admitting: Cardiovascular Disease

## 2016-10-10 NOTE — Telephone Encounter (Signed)
Pt in office to complete patient assistance form. States she is out of xarelto.  Samples of this drug were given to the patient, quantity 3 bottles, Lot Number [id number] exp 4/20

## 2016-10-10 NOTE — Telephone Encounter (Signed)
Patient calling the office for samples of medication:   1.  What medication and dosage are you requesting samples for? Xarelto 20 mg po daily   2.  Are you currently out of this medication? YES

## 2016-10-31 ENCOUNTER — Telehealth: Payer: Self-pay | Admitting: Cardiovascular Disease

## 2016-10-31 NOTE — Telephone Encounter (Signed)
Pt reports she has not heard back from [facility] Patient Assistance for help with xarelto. Completed paperwork was faxed from our office. Provided pt with their number, [phone]. She will call now for an update.  Samples of this drug were given to the patient, quantity 2 bottles , Lot Number [id number] exp: 04/2019

## 2016-10-31 NOTE — Telephone Encounter (Signed)
Pt is calling to see the status of her patient assistnace forms she brought by from [employer] for [place]. She states she is also out of Xarelto. Please call.

## 2016-11-02 ENCOUNTER — Emergency Department: Payer: Medicare HMO

## 2016-11-02 ENCOUNTER — Emergency Department
Admission: EM | Admit: 2016-11-02 | Discharge: 2016-11-02 | Disposition: A | Discharge status: Home / Self Care | Payer: Medicare HMO | Attending: Emergency Medicine | Admitting: Emergency Medicine

## 2016-11-02 DIAGNOSIS — Z7901 Long term (current) use of anticoagulants: Secondary | ICD-10-CM | POA: Diagnosis not present

## 2016-11-02 DIAGNOSIS — S62344A Nondisplaced fracture of base of fourth metacarpal bone, right hand, initial encounter for closed fracture: Secondary | ICD-10-CM | POA: Diagnosis not present

## 2016-11-02 DIAGNOSIS — S6991XA Unspecified injury of right wrist, hand and finger(s), initial encounter: Secondary | ICD-10-CM | POA: Diagnosis present

## 2016-11-02 DIAGNOSIS — Z79899 Other long term (current) drug therapy: Secondary | ICD-10-CM | POA: Diagnosis not present

## 2016-11-02 DIAGNOSIS — S62346A Nondisplaced fracture of base of fifth metacarpal bone, right hand, initial encounter for closed fracture: Secondary | ICD-10-CM | POA: Diagnosis not present

## 2016-11-02 DIAGNOSIS — F17210 Nicotine dependence, cigarettes, uncomplicated: Secondary | ICD-10-CM | POA: Diagnosis not present

## 2016-11-02 DIAGNOSIS — I10 Essential (primary) hypertension: Secondary | ICD-10-CM | POA: Insufficient documentation

## 2016-11-02 DIAGNOSIS — Y999 Unspecified external cause status: Secondary | ICD-10-CM | POA: Diagnosis not present

## 2016-11-02 DIAGNOSIS — Y92007 Garden or yard of unspecified non-institutional (private) residence as the place of occurrence of the external cause: Secondary | ICD-10-CM | POA: Insufficient documentation

## 2016-11-02 DIAGNOSIS — W010XXA Fall on same level from slipping, tripping and stumbling without subsequent striking against object, initial encounter: Secondary | ICD-10-CM | POA: Insufficient documentation

## 2016-11-02 DIAGNOSIS — J45909 Unspecified asthma, uncomplicated: Secondary | ICD-10-CM | POA: Insufficient documentation

## 2016-11-02 DIAGNOSIS — Y93H2 Activity, gardening and landscaping: Secondary | ICD-10-CM | POA: Insufficient documentation

## 2016-11-02 MED ORDER — OXYCODONE-ACETAMINOPHEN 5-325 MG PO TABS
1.0000 | ORAL_TABLET | Freq: Once | ORAL | Status: AC
  Administered 2016-11-02: 1 via ORAL
  Filled 2016-11-02: qty 1

## 2016-11-02 MED ORDER — ONDANSETRON 4 MG PO TBDP
4.0000 mg | ORAL_TABLET | Freq: Once | ORAL | Status: AC
  Administered 2016-11-02: 4 mg via ORAL
  Filled 2016-11-02: qty 1

## 2016-11-02 MED ORDER — OXYCODONE-ACETAMINOPHEN 5-325 MG PO TABS: 1.0000 | ORAL_TABLET | Freq: Four times a day (QID) | ORAL | 0 refills | Status: AC | PRN

## 2016-11-02 NOTE — ED Triage Notes (Signed)
Pt states she tripped over her front door threshold at noon today injuring right hand. Pt with bruising noted to right hand. Pt took 1000 mg of tylenol at 1900 today. Cms intact to fingers.

## 2016-11-02 NOTE — ED Provider Notes (Signed)
[facility] Emergency Department Provider Note  ____________________________________________  Time seen: Approximately 10:04 PM  I have reviewed the triage vital signs and the nursing notes.   HISTORY  Chief Complaint Hand Injury    HPI Tina Peters is a 66 y.o. female presenting to the emergency department with 8 out of 10 4th and fifth digit right hand pain after patient tripped and fell while watering her plants outside. She denies hitting her head or loss of consciousness. She denies weakness, radiculopathy or changes in sensation of the right upper extremity. Patient denies prior surgeries to the right upper extremity. No alleviating measures have been attempted.   Past Medical History:  Diagnosis Date  . Arrhythmia   . Asthma   . Chronic back pain   . Diverticulitis   . Hyperlipidemia   . Hypertension   . Hypertension   . Lipoma   . Obesity   . PAF (paroxysmal atrial fibrillation) ([place])    a. diagnosed in 05/2016. Started on Xarelto  . Paroxysmal SVT (supraventricular tachycardia) [facility])     Patient Active Problem List   Diagnosis Date Noted  . PAC (premature atrial contraction)   . PAT (paroxysmal atrial tachycardia) ([place])   . Atrial fibrillation ([place])   . Chronic venous insufficiency 09/29/2014  . SVT (supraventricular tachycardia) ([place]) 07/15/2014  . Hypokalemia 07/15/2014  . Hypertension     Past Surgical History:  Procedure Laterality Date  . ACHILLES TENDON SURGERY    . BACK SURGERY    . KNEE ARTHROSCOPY W/ MENISCAL REPAIR    . KNEE SURGERY    . OVARY SURGERY    . TUMOR REMOVAL     neck    Prior to Admission medications   Medication Sig Start Date End Date Taking? Authorizing Provider  acetaminophen (TYLENOL) 325 MG tablet Take 650 mg by mouth daily.    [provider]  citalopram (CELEXA) 20 MG tablet Take 20 mg by mouth daily.    [provider]  clonazePAM (KLONOPIN) 0.5 MG tablet Take 0.5 mg by  mouth at bedtime as needed. 06/22/14   [provider]  loratadine (CLARITIN) 10 MG tablet Take 10 mg by mouth daily.    [provider]  metoprolol succinate (TOPROL-XL) 100 MG 24 hr tablet Take 1 tablet (100 mg total) by mouth daily. 08/16/16   Wellington Hampshire, MD  oxyCODONE-acetaminophen (ROXICET) 5-325 MG tablet Take 1 tablet by mouth every 6 (six) hours as needed for severe pain. 11/02/16 11/07/16  Lannie Fields, PA-C  rivaroxaban (XARELTO) 20 MG TABS tablet Take 1 tablet (20 mg total) by mouth daily with supper. 08/15/16   Wellington Hampshire, MD    Allergies Patient has no known allergies.  Family History  Problem Relation Age of Onset  . Diabetes Mother     Social History Social History  Substance Use Topics  . Smoking status: Current Some Day Smoker    Packs/day: 0.25    Years: 40.00    Types: Cigarettes  . Smokeless tobacco: Never Used     Comment: Used to smoke heavily, cut back in 2015  . Alcohol use 0.0 oz/week     Comment: socially, 1-2 drinks/week     Review of Systems  Constitutional: No fever/chills Eyes: No visual changes. No discharge ENT: No upper respiratory complaints. Cardiovascular: no chest pain. Respiratory: no cough. No SOB. Musculoskeletal: Patient has right fourth and fifth digit pain Skin: Negative for rash, abrasions, lacerations, ecchymosis. Neurological: Negative  for headaches, focal weakness or numbness.   ____________________________________________   PHYSICAL EXAM:  VITAL SIGNS: ED Triage Vitals [11/02/16 2111]  Enc Vitals Group     BP (!) 154/76     Pulse Rate (!) 58     Resp 16     Temp 98.6 F (37 C)     Temp src      SpO2 100 %     Weight 225 lb (102.1 kg)     Height 5\' 5"  (1.651 m)     Head Circumference      Peak Flow      Pain Score 8     Pain Loc      Pain Edu?      Excl. in [place]?      Constitutional: Alert and oriented. Well appearing and in no acute distress. Eyes: Conjunctivae are normal.  PERRL. EOMI. Head: Atraumatic. Cardiovascular: Normal rate, regular rhythm. Normal S1 and S2.  Good peripheral circulation. Respiratory: Normal respiratory effort without tachypnea or retractions. Lungs CTAB. Good air entry to the bases with no decreased or absent breath sounds. Musculoskeletal: Patient has 5 out of 5 strength in the upper extremities bilaterally. Patient performs full range of motion at the right wrist and right elbow. She is able to move all 5 right fingers. Patient has pain with palpation at the base of the fourth and fifth metacarpals. Palpable radial and ulnar pulses bilaterally and symmetrically. Neurologic:  Normal speech and language. No gross focal neurologic deficits are appreciated.  Skin:  Skin is warm, dry and intact. No rash noted. Psychiatric: Mood and affect are normal. Speech and behavior are normal. Patient exhibits appropriate insight and judgement.   ____________________________________________   LABS (all labs ordered are listed, but only abnormal results are displayed)  Labs Reviewed - No data to display ____________________________________________  EKG   ____________________________________________  RADIOLOGY Unk Pinto, personally viewed and evaluated these images (plain radiographs) as part of my medical decision making, as well as reviewing the written report by the radiologist.  Dg Hand Complete Right  Result Date: 11/02/2016 CLINICAL DATA:  Trip and fall injury today. Persistent swelling and pain. EXAM: RIGHT HAND - COMPLETE 3+ VIEW COMPARISON:  None. FINDINGS: There are acute fractures at the bases of the fourth and fifth metacarpals with mild dorsal displacement and mild impaction. No dislocation. Moderate arthritic changes at the first [facility] joint. IMPRESSION: Fractures at the bases of the fourth and fifth metacarpals with mild impaction and dorsal displacement. Electronically Signed   By: Andreas Newport M.D.   On: 11/02/2016 21:46     ____________________________________________    PROCEDURES  Procedure(s) performed:    Procedures    Medications  oxyCODONE-acetaminophen (PERCOCET/ROXICET) 5-325 MG per tablet 1 tablet (1 tablet Oral Given 11/02/16 2212)  ondansetron (ZOFRAN-ODT) disintegrating tablet 4 mg (4 mg Oral Given 11/02/16 2212)     ____________________________________________   INITIAL IMPRESSION / ASSESSMENT AND PLAN / ED COURSE  Pertinent labs & imaging results that were available during my care of the patient were reviewed by me and considered in my medical decision making (see chart for details).  Review of the [place] CSRS was performed in accordance of the [place] prior to dispensing any controlled drugs.     Assessment and plan: Right hand pain: Patient presents to the emergency department after tripping while watering her plants. Patient fell on her right outstretched arm. DG right hand reveals fractures at the base of the fourth and fifth metacarpals.  A splint was applied in the emergency department and Roxicet was given for pain. A referral was given to orthopedics, Dr. Mack Guise. Patient was discharged with Roxicet for pain. Vital signs are reassuring prior to discharge. All patient questions were answered. ____________________________________________  FINAL CLINICAL IMPRESSION(S) / ED DIAGNOSES  Final diagnoses:  Closed nondisplaced fracture of base of fourth metacarpal bone of right hand, initial encounter  Closed nondisplaced fracture of base of fifth metacarpal bone of right hand, initial encounter      NEW MEDICATIONS STARTED DURING THIS VISIT:  New Prescriptions   OXYCODONE-ACETAMINOPHEN (ROXICET) 5-325 MG TABLET    Take 1 tablet by mouth every 6 (six) hours as needed for severe pain.        This chart was dictated using voice recognition software/Dragon. Despite best efforts to proofread, errors can occur which can change the meaning. Any change was purely  unintentional.    Karren Cobble 11/02/16 2229    Orbie Pyo, MD 11/02/16 [phone]

## 2016-11-02 NOTE — ED Notes (Signed)

## 2016-11-14 ENCOUNTER — Telehealth: Payer: Self-pay | Admitting: Cardiovascular Disease

## 2016-11-14 NOTE — Telephone Encounter (Signed)
Pt calling stating that we filled out paper work for her Xarelto to help pay for this She was to call [place] she did but  Until this goes through she was hoping to get some samples  She is completely out of Xarelto   Patient calling the office for samples of medication:   1.  What medication and dosage are you requesting samples for? Xarelto 20 mg  2.  Are you currently out of this medication?  Yes

## 2016-11-14 NOTE — Telephone Encounter (Signed)
Samples place up front.  Xarelto 20MG  4 bottles  XTG#[id number] EXP: 03/20

## 2016-12-18 ENCOUNTER — Other Ambulatory Visit: Payer: Self-pay | Admitting: Cardiovascular Disease

## 2016-12-23 DIAGNOSIS — M5416 Radiculopathy, lumbar region: Secondary | ICD-10-CM | POA: Insufficient documentation

## 2016-12-23 DIAGNOSIS — M545 Low back pain, unspecified: Secondary | ICD-10-CM | POA: Insufficient documentation

## 2017-01-10 ENCOUNTER — Telehealth: Payer: Self-pay | Admitting: Cardiovascular Disease

## 2017-01-10 MED ORDER — METOPROLOL SUCCINATE ER 100 MG PO TB24: 100.0000 mg | ORAL_TABLET | Freq: Every day | ORAL | 3 refills | Status: DC

## 2017-01-10 NOTE — Telephone Encounter (Addendum)
S/w pt who reports Tina Peters Patient Assistance denied her request for assistance with xarelto. We discussed [facility] and I have provided pt the phone number so she can call today. Pt verbalized understanding and is appreciative of the call.  Pt will call back to let us know if she is able to get assistance.

## 2017-01-10 NOTE — Telephone Encounter (Signed)
Refill for 90 day supply with 3 refills sent to [facility] for Metoprolol succ. 100 mg per patient request.  Please review options with patient regarding Xarelto, the patient can't afford.

## 2017-01-10 NOTE — Telephone Encounter (Signed)
°*  STAT* If patient is at the pharmacy, call can be transferred to refill team.   1. Which medications need to be refilled? (please list name of each medication and dose if known)  Metoprolol   2. Which pharmacy/location (including street and city if local pharmacy) is medication to be sent to? [facility]   3. Do they need a 30 day or 90 day supply? 90 day   Pt also states she was not approved through [place] to help with her Xarelto  Would like advise on that she states she can't do $300 a month for that

## 2017-03-03 ENCOUNTER — Encounter: Payer: Self-pay | Admitting: Cardiovascular Disease

## 2017-03-03 ENCOUNTER — Ambulatory Visit (INDEPENDENT_AMBULATORY_CARE_PROVIDER_SITE_OTHER): Payer: Medicare HMO | Admitting: Cardiovascular Disease

## 2017-03-03 VITALS — BP 146/80 | HR 54 | Ht 65.5 in | Wt 227.5 lb

## 2017-03-03 DIAGNOSIS — I480 Paroxysmal atrial fibrillation: Secondary | ICD-10-CM

## 2017-03-03 DIAGNOSIS — I10 Essential (primary) hypertension: Secondary | ICD-10-CM

## 2017-03-03 NOTE — Progress Notes (Signed)
Cardiology Office Note   Date:  03/03/2017   ID:  Tina Peters, DOB 01-14-1951, MRN [id number]  PCP:  Donnie Coffin, MD  Cardiologist:   [name], MD   Chief Complaint  Patient presents with  . other    6 month follow up. Patient c/o leg cramps. Meds reviewed verbally with patient.       History of Present Illness: Tina Peters is a 66 y.o. female who presents fora follow-up visit regarding paroxysmal supraventricular tachycardia and paroxysmal atrial fibrillation. She has known history of hypertension, hyperlipidemia and tobacco use. There is no family history of premature coronary artery disease. She had an episode of supraventricular tachycardia in 2016 in the setting of hypokalemia thought to be due to treatment with hydrochlorothiazide. She was placed on small dose extended release diltiazem with improvement in symptoms . It was switched to metoprolol  due to leg edema.  She has known history of sleep apnea on CPAP. She had atrial fibrillation with rapid ventricular response in 05/2016.  She converted to sinus rhythm with diltiazem. The patient was started on Xarelto for anticoagulation. The dose of Toprol was increased.  Echocardiogram showed normal LV systolic function with an EF of 55-60%, mild mitral regurgitation and moderately dilated left atrium.  She has been doing reasonably well with no chest pain or shortness of breath.  She reports intermittent palpitations that usually does not last long.  She stopped taking Xarelto about 2 months ago because she could not afford it.  She applied for the assistance program did not qualify.  She had a right hand fracture this summer after a fall.  She has been under stress lately as her grandson was recently diagnosed with type 1 diabetes.    Past Medical History:  Diagnosis Date  . Arrhythmia   . Asthma   . Chronic back pain   . Diverticulitis   . Hyperlipidemia   . Hypertension   . Hypertension   . Lipoma   .  Obesity   . PAF (paroxysmal atrial fibrillation) ([place])    a. diagnosed in 05/2016. Started on Xarelto  . Paroxysmal SVT (supraventricular tachycardia) ([facility])     Past Surgical History:  Procedure Laterality Date  . ACHILLES TENDON SURGERY    . BACK SURGERY    . KNEE ARTHROSCOPY W/ MENISCAL REPAIR    . KNEE SURGERY    . OVARY SURGERY    . TUMOR REMOVAL     neck     Current Outpatient Prescriptions  Medication Sig Dispense Refill  . acetaminophen (TYLENOL) 325 MG tablet Take 650 mg by mouth daily.    . ASPIRIN 81 PO Take 81 mg by mouth daily.    . citalopram (CELEXA) 20 MG tablet Take 20 mg by mouth daily.    . clonazePAM (KLONOPIN) 0.5 MG tablet Take 0.5 mg by mouth at bedtime as needed.    . loratadine (CLARITIN) 10 MG tablet Take 10 mg by mouth daily.    . metoprolol succinate (TOPROL-XL) 100 MG 24 hr tablet Take 1 tablet (100 mg total) by mouth daily. Take with or immediately following a meal. 90 tablet 3  . rivaroxaban (XARELTO) 20 MG TABS tablet Take 1 tablet (20 mg total) by mouth daily with supper. (Patient not taking: Reported on 03/03/2017) 30 tablet 11   No current facility-administered medications for this visit.     Allergies:   Patient has no known allergies.    Social History:  The  patient  reports that she has been smoking Cigarettes.  She has a 10.00 pack-year smoking history. She has never used smokeless tobacco. She reports that she drinks alcohol. She reports that she does not use drugs.   Family History:  The patient's family history includes Diabetes in her mother.    ROS:  Please see the history of present illness.   Otherwise, review of systems are positive for none.   All other systems are reviewed and negative.    PHYSICAL EXAM: VS:  BP (!) 146/80 (BP Location: Left Arm, Patient Position: Sitting, Cuff Size: Large)   Pulse (!) 54   Ht 5' 5.5" (1.664 m)   Wt 227 lb 8 oz (103.2 kg)   BMI 37.28 kg/m  , BMI Body mass index is 37.28 kg/m. GEN: Well  nourished, well developed, in no acute distress  HEENT: normal  Neck: no JVD, carotid bruits, or masses Cardiac: RRR; no murmurs, rubs, or gallops,no edema  Respiratory:  clear to auscultation bilaterally, normal work of breathing GI: soft, nontender, nondistended, + BS MS: no deformity or atrophy  Skin: warm and dry, no rash Neuro:  Strength and sensation are intact Psych: euthymic mood, full affect   EKG:  EKG is ordered today. The ekg ordered today demonstrates sinus bradycardia with no significant ST or T wave changes.  Recent Labs: 05/31/2016: TSH 1.602 08/15/2016: BUN 14; Creatinine, Ser 1.08; Hemoglobin 13.3; Platelets 254; Potassium 4.5; Sodium 142    Lipid Panel    Component Value Date/Time   CHOL 194 06/01/2016 0232   TRIG 117 06/01/2016 0232   HDL 36 (L) 06/01/2016 0232   CHOLHDL 5.4 06/01/2016 0232   VLDL 23 06/01/2016 0232   LDLCALC 135 (H) 06/01/2016 0232      Wt Readings from Last 3 Encounters:  03/03/17 227 lb 8 oz (103.2 kg)  11/02/16 225 lb (102.1 kg)  09/06/16 225 lb (102.1 kg)       No flowsheet data found.    ASSESSMENT AND PLAN:  1.Paroxysmal atrial fibrillation: Symptoms are well controlled on current dose of Toprol.   CHADS VASc score is 3.  Fortunately, she stopped taking Xarelto due to cost.  I discussed with her the option of anticoagulation with warfarin but she does not want to go that route.  I advised her to try to apply for a different prescription plan Via Medicare.  We are also going to try to help her with the assistance program paperwork.  2. Essential hypertension: Blood pressure is reasonably controlled  3. Sleep apnea: Currently using CPAP.  4. Anxiety: Improved with citalopram.  Disposition:   FU with me in 6 months  Signed,  [name], MD  03/03/2017 1:31 PM    [place]

## 2017-03-03 NOTE — Patient Instructions (Signed)

## 2017-09-26 ENCOUNTER — Other Ambulatory Visit: Payer: Self-pay | Admitting: Family Medicine

## 2017-09-26 DIAGNOSIS — Z780 Asymptomatic menopausal state: Secondary | ICD-10-CM

## 2017-10-06 ENCOUNTER — Other Ambulatory Visit: Payer: Medicare HMO

## 2017-10-21 ENCOUNTER — Encounter: Payer: Self-pay | Admitting: Radiology

## 2017-10-21 ENCOUNTER — Ambulatory Visit
Admission: RE | Admit: 2017-10-21 | Discharge: 2017-10-21 | Disposition: A | Discharge status: Home / Self Care | Payer: Medicare HMO | Source: Ambulatory Visit | Attending: Family Medicine | Admitting: Family Medicine

## 2017-10-21 DIAGNOSIS — Z780 Asymptomatic menopausal state: Secondary | ICD-10-CM | POA: Insufficient documentation

## 2017-12-10 ENCOUNTER — Other Ambulatory Visit: Payer: Self-pay | Admitting: Cardiovascular Disease

## 2018-01-07 ENCOUNTER — Other Ambulatory Visit: Payer: Self-pay | Admitting: *Deleted

## 2018-01-07 ENCOUNTER — Telehealth: Payer: Self-pay | Admitting: Cardiovascular Disease

## 2018-01-07 MED ORDER — METOPROLOL SUCCINATE ER 100 MG PO TB24: ORAL_TABLET | ORAL | 0 refills | Status: DC

## 2018-01-07 NOTE — Telephone Encounter (Signed)
°*  STAT* If patient is at the pharmacy, call can be transferred to refill team.   1. Which medications need to be refilled? (please list name of each medication and dose if known) metoprolol succinate 100 MG  -1 tablet daily  2. Which pharmacy/location (including street and city if local pharmacy) is medication to be sent to? [place]   3. Do they need a 30 day or 90 day supply? 90 day   Patient scheduled to see C. Berge on 10/10

## 2018-01-07 NOTE — Telephone Encounter (Signed)
Requested Prescriptions   Signed Prescriptions Disp Refills  . metoprolol succinate (TOPROL-XL) 100 MG 24 hr tablet 90 tablet 0    Sig: TAKE 1 TABLET DAILY. TAKE WITH OR IMMEDIATELY FOLLOWING A MEAL.    Authorizing Provider: [name] A    Ordering User: Britt Bottom

## 2018-02-12 ENCOUNTER — Ambulatory Visit: Payer: Medicare HMO | Admitting: Nurse Practitioner

## 2018-02-12 DIAGNOSIS — R0989 Other specified symptoms and signs involving the circulatory and respiratory systems: Secondary | ICD-10-CM

## 2018-02-13 ENCOUNTER — Encounter: Payer: Self-pay | Admitting: Nurse Practitioner

## 2018-03-24 ENCOUNTER — Other Ambulatory Visit: Payer: Self-pay | Admitting: Cardiovascular Disease

## 2018-03-25 ENCOUNTER — Telehealth: Payer: Self-pay | Admitting: Cardiovascular Disease

## 2018-03-25 NOTE — Telephone Encounter (Signed)
-----   Message from Alba Destine, [place] sent at 03/24/2018 12:48 PM EST ----- Needs an appointment for refills, 90 day supply sent into [facility]

## 2018-03-25 NOTE — Telephone Encounter (Signed)
Lmov for patient to call and schedule °

## 2018-03-31 NOTE — Telephone Encounter (Signed)
Patient scheduled for 04/15/18 with Christell Faith

## 2018-04-13 NOTE — Progress Notes (Signed)
Cardiology Office Note Date:  04/15/2018  Patient ID:  Tina Peters, DOB 05/18/1950, MRN [id number] PCP:  Donnie Coffin, MD  Cardiologist:  Dr. Fletcher Anon, MD    Chief Complaint: Follow up  History of Present Illness: Tina Peters is a 68 y.o. female with history of PAF on Xarelto though self-discontinued in 12/2016, paroxysmal SVT, HTN, HLD, sleep apnea on CPAP, and tobacco abuse who presents for follow up of her SVT and PAF.   She had an episode of SVT in 2016 in the setting of hypokalemia thought to be secondary to HCTZ. At that time, she was started on low-dose Cardizem CD with improvement in symptoms. She was subsequently changed to metoprolol secondary to leg swelling on calcium channel blocker. In 05/2016, she developed Afib with RVR. She converted to sinus rhythm with diltiazem. She was placed on Xarelto given her CHADS2VASc was at least 3 (HTN, age x 1, female). Her Toprol was titrated. Echo in 08/2016 showed an EF of 55-60%, mild MR, moderately dilated left atrium measuring 44 mm. At the beginning of the echo she was noted to be tachycardic and possibly in Afib with RVR with a heart rate of 156 bpm. Her heart rate then slowed to 100 bpm, followed by conversion to sinus bradycardia during the study with a heart rate of 55 bpm. In this setting, her Toprol was increased to 100 mg daily. She was last seen in the office in 02/2017 and was doing reasonably well. She noted intermittent palpitations that were short-lived. She was noted to have self-discontinued Xarelto in ~ 12/2016 as she reported she could not afford it. She did not qualify for the assistance program. It was recommended she change prescription coverage plans.   There is no family history of premature CAD.   Labs: 03/2018 - serum creatinine 1.02, potassium 4.3, LDL 162, A1c 5.8  She comes in doing well from a cardiac perspective.  She continues to note intermittent palpitations that have occurred approximately 5 times since she  was last seen greater than 1 year prior.  She relates these episodes to when she is in a hurry or stressed out.  Palpitations typically last 1 to 2 hours and spontaneously resolved.  Since she was last seen, she has had 2 mechanical falls though none recently.  She last fell approximately 1 year prior.  No BRBPR or melena.  She has chronic low back pain with radiculopathy affecting the right leg.  She is planning to get an MRI in January, 2020.  She is now back on Xarelto and paste $12 for 3 months worth.  She has been compliant with this medication.  She is now trying to eat a healthier diet.  Past Medical History:  Diagnosis Date  . Arrhythmia   . Asthma   . Chronic back pain   . Diverticulitis   . Hyperlipidemia   . Hypertension   . Hypertension   . Lipoma   . Obesity   . PAF (paroxysmal atrial fibrillation) ([place])    a. diagnosed in 05/2016. Started on Xarelto  . Paroxysmal SVT (supraventricular tachycardia) ([facility])     Past Surgical History:  Procedure Laterality Date  . ACHILLES TENDON SURGERY    . BACK SURGERY    . KNEE ARTHROSCOPY W/ MENISCAL REPAIR    . KNEE SURGERY    . OVARY SURGERY    . TUMOR REMOVAL     neck    Current Meds  Medication Sig  . acetaminophen (TYLENOL)  325 MG tablet Take 650 mg by mouth every 4 (four) hours as needed.   Marland Kitchen albuterol (PROVENTIL HFA;VENTOLIN HFA) 108 (90 Base) MCG/ACT inhaler 2 puffs q.i.d. p.r.n. short of breath, wheezing, or cough  . ASPIRIN 81 PO Take 81 mg by mouth daily as needed.   . citalopram (CELEXA) 20 MG tablet Take 20 mg by mouth daily.  . cyclobenzaprine (FLEXERIL) 10 MG tablet Take 10 mg by mouth 3 (three) times daily as needed. for muscle spams  . ibuprofen (ADVIL,MOTRIN) 200 MG tablet Take 400 mg by mouth as needed.  . metoprolol succinate (TOPROL-XL) 100 MG 24 hr tablet TAKE 1 TABLET DAILY WITH OR IMMEDIATELY FOLLOWING A MEAL. (MUST KEEP APPOINTMENT FOR FUTURE REFILLS)  . naproxen sodium (ALEVE) 220 MG tablet Take 220 mg by  mouth daily as needed.  . rivaroxaban (XARELTO) 20 MG TABS tablet Take 1 tablet (20 mg total) by mouth daily with supper.  . traZODone (DESYREL) 50 MG tablet TAKE 1 TABLET BY MOUTH AT BEDTIME AS NEEDED FOR SLEEP MAY INCREASE TO 2 TABLETS AT BEDTIME IF NOT IMPROVED    Allergies:   Patient has no known allergies.   Social History:  The patient  reports that she has been smoking cigarettes. She has a 10.00 pack-year smoking history. She has never used smokeless tobacco. She reports that she drinks alcohol. She reports that she does not use drugs.   Family History:  The patient's family history includes Diabetes in her mother.  ROS:   Review of Systems  Constitutional: Negative for chills, diaphoresis, fever and weight loss.  HENT: Negative for congestion.   Eyes: Negative for discharge and redness.  Respiratory: Negative for cough, hemoptysis, sputum production, shortness of breath and wheezing.   Cardiovascular: Positive for palpitations. Negative for chest pain, orthopnea, claudication, leg swelling and PND.  Gastrointestinal: Negative for abdominal pain, blood in stool, heartburn, melena, nausea and vomiting.  Genitourinary: Negative for hematuria.  Musculoskeletal: Positive for back pain and joint pain. Negative for falls and myalgias.  Skin: Negative for rash.  Neurological: Negative for dizziness, tingling, tremors, sensory change, speech change, focal weakness, loss of consciousness and weakness.  Endo/Heme/Allergies: Does not bruise/bleed easily.  Psychiatric/Behavioral: Negative for substance abuse. The patient is not nervous/anxious.   All other systems reviewed and are negative.    PHYSICAL EXAM:  VS:  BP (!) 160/90 (BP Location: Left Arm, Patient Position: Sitting, Cuff Size: Large)   Pulse (!) 57   Ht 5\' 5"  (1.651 m)   Wt 228 lb (103.4 kg)   BMI 37.94 kg/m  BMI: Body mass index is 37.94 kg/m.  Physical Exam  Constitutional: She is oriented to person, place, and time.  She appears well-developed and well-nourished.  HENT:  Head: Normocephalic and atraumatic.  Eyes: Right eye exhibits no discharge. Left eye exhibits no discharge.  Neck: Normal range of motion. No JVD present.  Cardiovascular: Normal rate, regular rhythm, S1 normal, S2 normal and normal heart sounds. Exam reveals no distant heart sounds, no friction rub, no midsystolic click and no opening snap.  No murmur heard. Pulses:      Posterior tibial pulses are 2+ on the right side, and 2+ on the left side.  Pulmonary/Chest: Effort normal and breath sounds normal. No respiratory distress. She has no decreased breath sounds. She has no wheezes. She has no rales. She exhibits no tenderness.  Abdominal: Soft. She exhibits no distension. There is no tenderness.  Musculoskeletal: She exhibits no edema.  Neurological: She  is alert and oriented to person, place, and time.  Skin: Skin is warm and dry. No cyanosis. Nails show no clubbing.  Psychiatric: She has a normal mood and affect. Her speech is normal and behavior is normal. Judgment and thought content normal.     EKG:  Was ordered and interpreted by me today. Shows sinus bradycardia, 57 bpm, rare PAC, no acute st/t changes   Recent Labs: No results found for requested labs within last 8760 hours.  No results found for requested labs within last 8760 hours.   CrCl cannot be calculated (Patient's most recent lab result is older than the maximum 21 days allowed.).   Wt Readings from Last 3 Encounters:  04/15/18 228 lb (103.4 kg)  03/03/17 227 lb 8 oz (103.2 kg)  11/02/16 225 lb (102.1 kg)     Other studies reviewed: Additional studies/records reviewed today include: summarized above  ASSESSMENT AND PLAN:  1. PAF: Currently in sinus rhythm with a mildly bradycardic rate.  Continue Toprol-XL 100 mg daily for rate control.  Continue Xarelto 20 mg q. dinner.  Patient notes stable, intermittent palpitations consistent with her prior episodes of A.  fib typically when she is in a hurry or stressed out.  I offered her outpatient cardiac monitoring to quantify her burden, she states that her burden is about the same and has episodes every 1 to 3 months lasting a couple of hours.  She declines cardiac monitoring at this time.  She will let us know should her symptoms worsen.  2. HTN: Blood pressure is suboptimally controlled today however she has not yet taken her medications.  I advised her to go home and take her medications.  Showed her blood pressure continue to run on the high side she will let us know.  3. Sleep apnea: Compliant with CPAP.   4. Mild mitral regurgitation: No murmur heard on exam today.  Plan for follow-up echocardiogram at her next visit.  Disposition: F/u with Dr. Fletcher Anon or an APP in 6 months.  Current medicines are reviewed at length with the patient today.  The patient did not have any concerns regarding medicines.  Signed, Christell Faith, PA-C 04/15/2018 10:34 AM     [facility] [address] [place] [address] [place], [place] [address] [phone]

## 2018-04-15 ENCOUNTER — Encounter: Payer: Self-pay | Admitting: Physician Assistant

## 2018-04-15 ENCOUNTER — Ambulatory Visit (INDEPENDENT_AMBULATORY_CARE_PROVIDER_SITE_OTHER): Payer: Medicare HMO | Admitting: Physician Assistant

## 2018-04-15 VITALS — BP 160/90 | HR 57 | Ht 65.0 in | Wt 228.0 lb

## 2018-04-15 DIAGNOSIS — I340 Nonrheumatic mitral (valve) insufficiency: Secondary | ICD-10-CM | POA: Diagnosis not present

## 2018-04-15 DIAGNOSIS — I480 Paroxysmal atrial fibrillation: Secondary | ICD-10-CM

## 2018-04-15 DIAGNOSIS — Z9989 Dependence on other enabling machines and devices: Secondary | ICD-10-CM

## 2018-04-15 DIAGNOSIS — G4733 Obstructive sleep apnea (adult) (pediatric): Secondary | ICD-10-CM | POA: Diagnosis not present

## 2018-04-15 DIAGNOSIS — I10 Essential (primary) hypertension: Secondary | ICD-10-CM | POA: Diagnosis not present

## 2018-04-15 NOTE — Patient Instructions (Signed)
Medication Instructions:  Your physician recommends that you continue on your current medications as directed. Please refer to the Current Medication list given to you today.  If you need a refill on your cardiac medications before your next appointment, please call your pharmacy.   Lab work: None ordered   If you have labs (blood work) drawn today and your tests are completely normal, you will receive your results only by: Marland Kitchen MyChart Message (if you have MyChart) OR . A paper copy in the mail If you have any lab test that is abnormal or we need to change your treatment, we will call you to review the results.  Testing/Procedures: None ordered   Follow-Up: At [facility], you and your health needs are our priority.  As part of our continuing mission to provide you with exceptional heart care, we have created designated Provider Care Teams.  These Care Teams include your primary Cardiologist (physician) and Advanced Practice Providers (APPs -  Physician Assistants and Nurse Practitioners) who all work together to provide you with the care you need, when you need it. You will need a follow up appointment in 6 months.  Please call our office 2 months in advance to schedule this appointment.  You may see Dr. Rockey Situ or one of the following Advanced Practice Providers on your designated Care Team:   Murray Hodgkins, NP Christell Faith, PA-C . Marrianne Mood, PA-C

## 2018-06-20 ENCOUNTER — Other Ambulatory Visit: Payer: Self-pay | Admitting: Cardiovascular Disease

## 2018-06-30 IMAGING — CR DG CHEST 1V PORT
1 series · 1 of 1 positions shown · non-contrast
Comparison: 07/15/2014

CLINICAL DATA: Episodes of lightheadedness and dizziness beginning
today.

EXAM:
PORTABLE CHEST 1 VIEW

[portable]
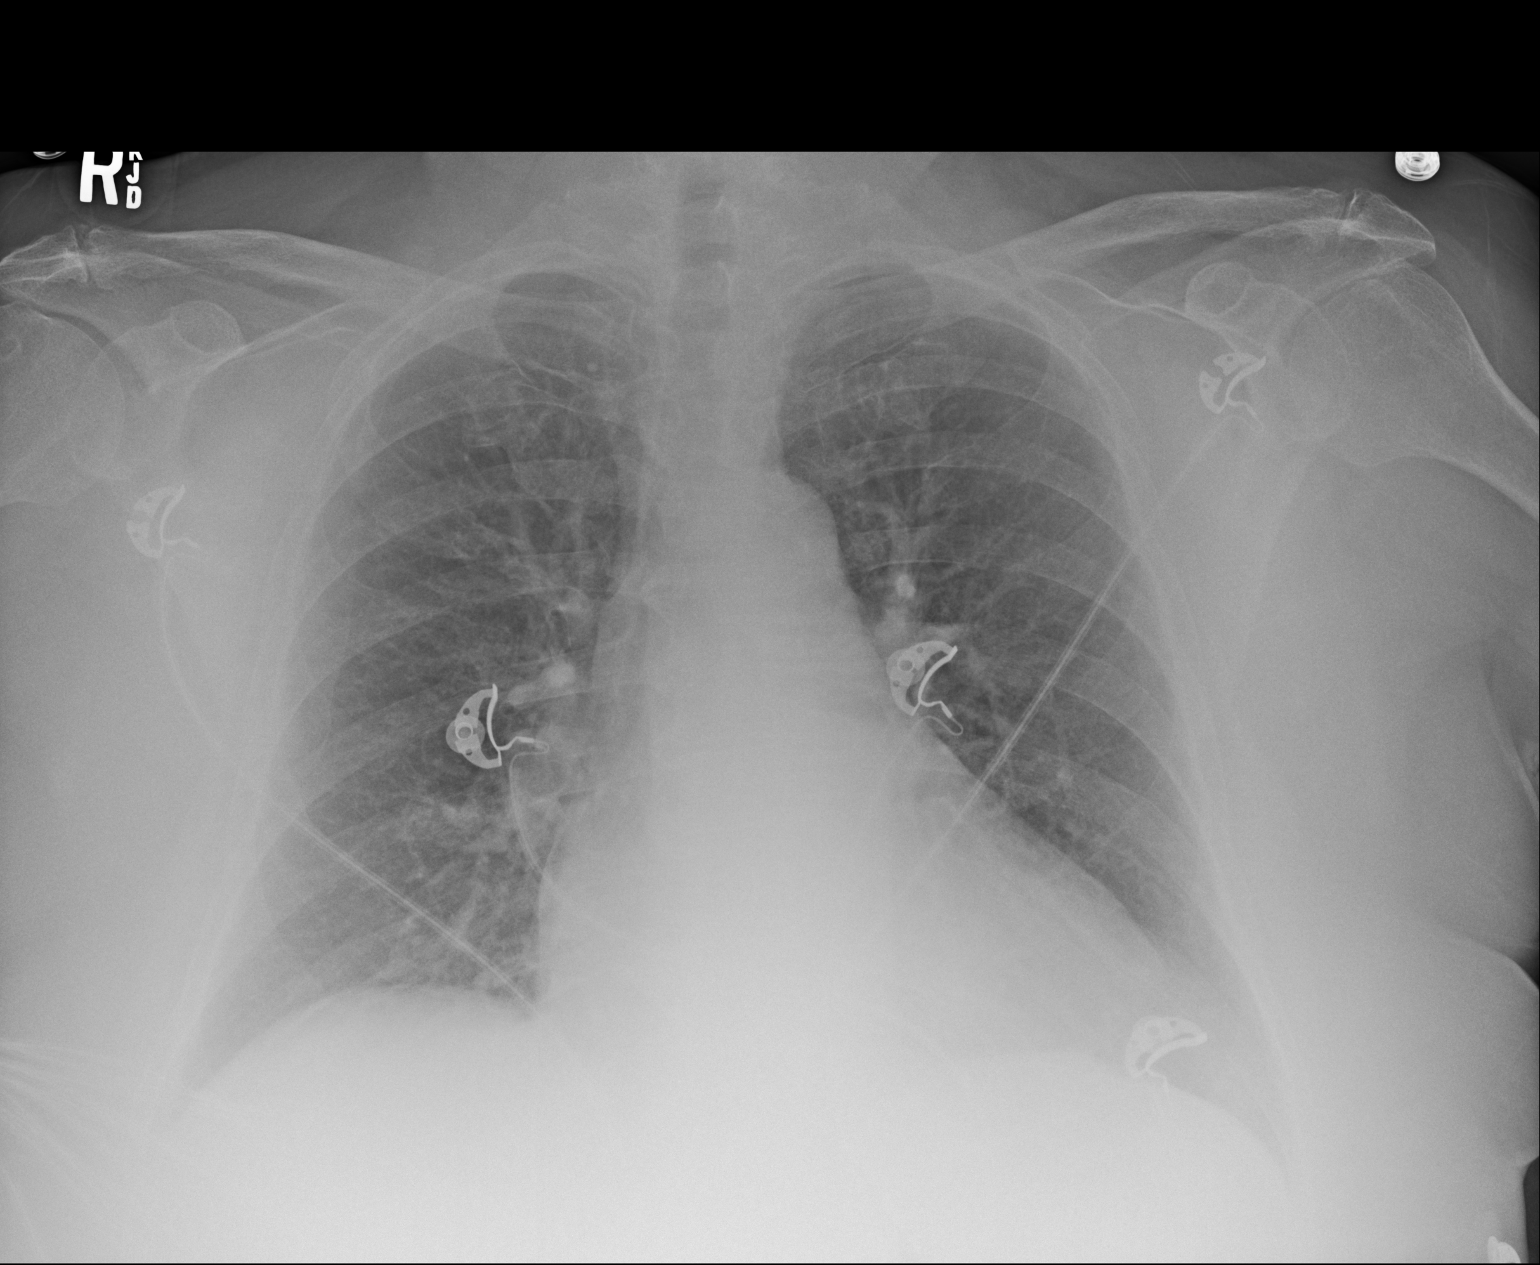

[1 of 1 positions shown; findings below may reference images not displayed]

FINDINGS: Cardiac silhouette is top-normal in size. No mediastinal or hilar
masses. No convincing adenopathy.

Clear lungs.  No pleural effusion or pneumothorax.

Skeletal structures are demineralized but grossly intact.
IMPRESSION: No active disease.

## 2018-07-19 ENCOUNTER — Emergency Department
Admission: EM | Admit: 2018-07-19 | Discharge: 2018-07-19 | Disposition: A | Discharge status: Home / Self Care | Payer: Medicare HMO | Attending: Student in an Organized Health Care Education/Training Program | Admitting: Student in an Organized Health Care Education/Training Program

## 2018-07-19 ENCOUNTER — Emergency Department: Payer: Medicare HMO

## 2018-07-19 ENCOUNTER — Other Ambulatory Visit: Payer: Self-pay

## 2018-07-19 DIAGNOSIS — Z79899 Other long term (current) drug therapy: Secondary | ICD-10-CM | POA: Insufficient documentation

## 2018-07-19 DIAGNOSIS — R0602 Shortness of breath: Secondary | ICD-10-CM | POA: Insufficient documentation

## 2018-07-19 DIAGNOSIS — I10 Essential (primary) hypertension: Secondary | ICD-10-CM | POA: Diagnosis not present

## 2018-07-19 DIAGNOSIS — R0789 Other chest pain: Secondary | ICD-10-CM | POA: Insufficient documentation

## 2018-07-19 DIAGNOSIS — I4891 Unspecified atrial fibrillation: Secondary | ICD-10-CM | POA: Insufficient documentation

## 2018-07-19 DIAGNOSIS — F17210 Nicotine dependence, cigarettes, uncomplicated: Secondary | ICD-10-CM | POA: Insufficient documentation

## 2018-07-19 LAB — TROPONIN I: Troponin I: <0.03 ng/mL (ref <0.03)

## 2018-07-19 LAB — BASIC METABOLIC PANEL
Anion gap: 8 (ref 5–15)
BUN: 14 mg/dL (ref 8–23)
CO2: 23 mmol/L (ref 22–32)
CREATININE: 0.96 mg/dL (ref 0.44–1.00)
Calcium: 8.8 mg/dL — ABNORMAL LOW (ref 8.9–10.3)
Chloride: 107 mmol/L (ref 98–111)
GFR calc Af Amer: >60 mL/min (ref >60)
GFR calc non Af Amer: >60 mL/min (ref >60)
Glucose, Bld: 104 mg/dL — ABNORMAL HIGH (ref 70–99)
Potassium: 3.9 mmol/L (ref 3.5–5.1)
Sodium: 138 mmol/L (ref 135–145)

## 2018-07-19 LAB — CBC
HCT: 43.3 % (ref 36.0–46.0)
Hemoglobin: 13.9 g/dL (ref 12.0–15.0)
MCH: 28.7 pg (ref 26.0–34.0)
MCHC: 32.1 g/dL (ref 30.0–36.0)
MCV: 89.5 fL (ref 80.0–100.0)
Platelets: 200 10*3/uL (ref 150–400)
RBC: 4.84 MIL/uL (ref 3.87–5.11)
RDW: 13.2 % (ref 11.5–15.5)
WBC: 6.7 10*3/uL (ref 4.0–10.5)
nRBC: 0 % (ref 0.0–0.2)

## 2018-07-19 MED ORDER — LIDOCAINE 5 % EX PTCH: 1.0000 | MEDICATED_PATCH | Freq: Two times a day (BID) | CUTANEOUS | 0 refills | Status: AC

## 2018-07-19 MED ORDER — HYDROCODONE-ACETAMINOPHEN 5-325 MG PO TABS
1.0000 | ORAL_TABLET | Freq: Once | ORAL | Status: AC
  Administered 2018-07-19: 1 via ORAL
  Filled 2018-07-19: qty 1

## 2018-07-19 MED ORDER — ALBUTEROL SULFATE HFA 108 (90 BASE) MCG/ACT IN AERS: 2.0000 | INHALATION_SPRAY | Freq: Four times a day (QID) | RESPIRATORY_TRACT | 2 refills | Status: DC | PRN

## 2018-07-19 MED ORDER — DEXAMETHASONE 4 MG PO TABS
10.0000 mg | ORAL_TABLET | Freq: Once | ORAL | Status: AC
  Administered 2018-07-19: 10 mg via ORAL
  Filled 2018-07-19 (×2): qty 2.5

## 2018-07-19 MED ORDER — LIDOCAINE 5 % EX PTCH
1.0000 | MEDICATED_PATCH | CUTANEOUS | Status: DC
  Administered 2018-07-19: 1 via TRANSDERMAL
  Filled 2018-07-19: qty 1

## 2018-07-19 MED ORDER — HYDROCODONE-ACETAMINOPHEN 5-325 MG PO TABS: 1.0000 | ORAL_TABLET | ORAL | 0 refills | Status: DC | PRN

## 2018-07-19 NOTE — ED Triage Notes (Signed)
Pt states she tripped on door frame Tuesday and fell on chest. Bruised noted to L breast. Pt states when she coughs her central chest hurts. Pt c/o cough and congestion x1 week. Denies fever. A&O, ambulatory. No distress noted.

## 2018-07-19 NOTE — ED Provider Notes (Signed)
[facility] Emergency Department Provider Note    First MD Initiated Contact with Patient 07/19/18 1549     (approximate)  I have reviewed the triage vital signs and the nursing notes.   HISTORY  Chief Complaint Fall; Cough; and Chest Injury    HPI Tina Peters is a 68 y.o. female below listed past medical history presents the ER for anterior chest wall pain.  States that she is been having some shortness of breath and nonproductive cough.  No measured fevers.  Did have mechanical fall on Tuesday hitting her right side of her chest.  Was worried that she bruised her broke some ribs.  Does not take anything for pain.  Not currently coughing up any phlegm.  No recent antibiotics.  Has been diagnosed and treated for asthma and bronchitis in the past.  Does not have an inhaler at home.    Past Medical History:  Diagnosis Date  . Arrhythmia   . Asthma   . Chronic back pain   . Diverticulitis   . Hyperlipidemia   . Hypertension   . Hypertension   . Lipoma   . Obesity   . PAF (paroxysmal atrial fibrillation) ([place])    a. diagnosed in 05/2016. Started on Xarelto  . Paroxysmal SVT (supraventricular tachycardia) ([facility])    Family History  Problem Relation Age of Onset  . Diabetes Mother    Past Surgical History:  Procedure Laterality Date  . ACHILLES TENDON SURGERY    . BACK SURGERY    . KNEE ARTHROSCOPY W/ MENISCAL REPAIR    . KNEE SURGERY    . OVARY SURGERY    . TUMOR REMOVAL     neck   Patient Active Problem List   Diagnosis Date Noted  . PAC (premature atrial contraction)   . PAT (paroxysmal atrial tachycardia) ([place])   . Atrial fibrillation ([place])   . Chronic venous insufficiency 09/29/2014  . SVT (supraventricular tachycardia) ([place]) 07/15/2014  . Hypokalemia 07/15/2014  . Hypertension       Prior to Admission medications   Medication Sig Start Date End Date Taking? Authorizing Provider  acetaminophen (TYLENOL) 325 MG tablet Take 650  mg by mouth every 4 (four) hours as needed.     [provider]  albuterol (PROVENTIL HFA;VENTOLIN HFA) 108 (90 Base) MCG/ACT inhaler 2 puffs q.i.d. p.r.n. short of breath, wheezing, or cough 07/04/15   [provider]  ASPIRIN 81 PO Take 81 mg by mouth daily as needed.     [provider]  citalopram (CELEXA) 20 MG tablet Take 20 mg by mouth daily.    [provider]  cyclobenzaprine (FLEXERIL) 10 MG tablet Take 10 mg by mouth 3 (three) times daily as needed. for muscle spams 01/21/18   [provider]  ibuprofen (ADVIL,MOTRIN) 200 MG tablet Take 400 mg by mouth as needed.    [provider]  metoprolol succinate (TOPROL-XL) 100 MG 24 hr tablet TAKE 1 TABLET DAILY WITH OR IMMEDIATELY FOLLOWING A MEAL. (MUST KEEP APPOINTMENT FOR FUTURE REFILLS) 06/22/18   Wellington Hampshire, MD  naproxen sodium (ALEVE) 220 MG tablet Take 220 mg by mouth daily as needed.    [provider]  rivaroxaban (XARELTO) 20 MG TABS tablet Take 1 tablet (20 mg total) by mouth daily with supper. 08/15/16   Wellington Hampshire, MD  traZODone (DESYREL) 50 MG tablet TAKE 1 TABLET BY MOUTH AT BEDTIME AS NEEDED FOR SLEEP MAY INCREASE TO 2 TABLETS AT  BEDTIME IF NOT IMPROVED 03/11/18   [provider]    Allergies Patient has no known allergies.    Social History Social History   Tobacco Use  . Smoking status: Current Some Day Smoker    Packs/day: 0.25    Years: 40.00    Pack years: 10.00    Types: Cigarettes  . Smokeless tobacco: Never Used  . Tobacco comment: Used to smoke heavily, cut back in 2015  Substance Use Topics  . Alcohol use: Yes    Alcohol/week: 0.0 standard drinks    Comment: socially, 1-2 drinks/week  . Drug use: No    Review of Systems Patient denies headaches, rhinorrhea, blurry vision, numbness, shortness of breath, chest pain, edema, cough, abdominal pain, nausea, vomiting, diarrhea, dysuria, fevers, rashes or hallucinations unless  otherwise stated above in HPI. ____________________________________________   PHYSICAL EXAM:  VITAL SIGNS: Vitals:   07/19/18 1314  BP: (!) 138/55  Pulse: (!) 59  Resp: 16  Temp: 98.5 F (36.9 C)  SpO2: 96%    Constitutional: Alert and oriented.  Eyes: Conjunctivae are normal.  Head: Atraumatic. Nose: No congestion/rhinnorhea. Mouth/Throat: Mucous membranes are moist.   Neck: No stridor. Painless ROM.  Cardiovascular: Normal rate, regular rhythm. Grossly normal heart sounds.  Good peripheral circulation. Respiratory: Normal respiratory effort.  No retractions. Lungs with coarse breathsounds bilaterally. Gastrointestinal: Soft and nontender. No distention. No abdominal bruits. No CVA tenderness. Genitourinary:  Musculoskeletal: No lower extremity tenderness nor edema.  No joint effusions. Neurologic:  Normal speech and language. No gross focal neurologic deficits are appreciated. No facial droop Skin:  Skin is warm, dry and intact. No rash noted. Psychiatric: Mood and affect are normal. Speech and behavior are normal.  ____________________________________________   LABS (all labs ordered are listed, but only abnormal results are displayed)  Results for orders placed or performed during the hospital encounter of 07/19/18 (from the past 24 hour(s))  Basic metabolic panel     Status: Abnormal   Collection Time: 07/19/18  1:13 PM  Result Value Ref Range   Sodium 138 135 - 145 mmol/L   Potassium 3.9 3.5 - 5.1 mmol/L   Chloride 107 98 - 111 mmol/L   CO2 23 22 - 32 mmol/L   Glucose, Bld 104 (H) 70 - 99 mg/dL   BUN 14 8 - 23 mg/dL   Creatinine, Ser 0.96 0.44 - 1.00 mg/dL   Calcium 8.8 (L) 8.9 - 10.3 mg/dL   GFR calc non Af Amer >60 >60 mL/min   GFR calc Af Amer >60 >60 mL/min   Anion gap 8 5 - 15  CBC     Status: None   Collection Time: 07/19/18  1:13 PM  Result Value Ref Range   WBC 6.7 4.0 - 10.5 K/uL   RBC 4.84 3.87 - 5.11 MIL/uL   Hemoglobin 13.9 12.0 - 15.0 g/dL    HCT 43.3 36.0 - 46.0 %   MCV 89.5 80.0 - 100.0 fL   MCH 28.7 26.0 - 34.0 pg   MCHC 32.1 30.0 - 36.0 g/dL   RDW 13.2 11.5 - 15.5 %   Platelets 200 150 - 400 K/uL   nRBC 0.0 0.0 - 0.2 %  Troponin I - ONCE - STAT     Status: None   Collection Time: 07/19/18  1:13 PM  Result Value Ref Range   Troponin I <0.03 <0.03 ng/mL   ____________________________________________  EKG My review and personal interpretation at Time: 13:10   Indication: chest wall apin  Rate: 60  Rhythm: sinus Axis: normal Other: normal intervals accounting for wandering baseline artifact,  No stemi ____________________________________________  RADIOLOGY  I personally reviewed all radiographic images ordered to evaluate for the above acute complaints and reviewed radiology reports and findings.  These findings were personally discussed with the patient.  Please see medical record for radiology report.  ____________________________________________   PROCEDURES  Procedure(s) performed:  Procedures    Critical Care performed: no ____________________________________________   INITIAL IMPRESSION / ASSESSMENT AND PLAN / ED COURSE  Pertinent labs & imaging results that were available during my care of the patient were reviewed by me and considered in my medical decision making (see chart for details).   DDX: Contusion, fracture, pneumothorax, costochondritis, pneumonia, doubt ACS  Tina Peters is a 68 y.o. who presents to the ED with musculoskeletal pain as described above.  Not consistent with ACS.  Pain is reproducible palpation of the chest wall.  Will treat for contusion.  No evidence of rib fracture and she is now several days after the injury do not feel that CT imaging clinically indicated.  States that she has been having cough and feels similar to bronchitis therefore will give prescription for inhaler as well as a dose of Decadron.  She stable and appropriate for outpatient follow-up.      As  part of my medical decision making, I reviewed the following data within the [facility] notes reviewed and incorporated, Labs reviewed, notes from prior ED visits and [place] Controlled Substance Database   ____________________________________________   FINAL CLINICAL IMPRESSION(S) / ED DIAGNOSES  Final diagnoses:  Chest wall pain      NEW MEDICATIONS STARTED DURING THIS VISIT:  New Prescriptions   No medications on file     Note:  This document was prepared using Dragon voice recognition software and may include unintentional dictation errors.    Merlyn Lot, MD 07/19/18 1610

## 2018-08-21 ENCOUNTER — Other Ambulatory Visit: Payer: Self-pay | Admitting: Cardiovascular Disease

## 2018-11-20 ENCOUNTER — Other Ambulatory Visit: Payer: Self-pay | Admitting: Cardiovascular Disease

## 2018-11-20 NOTE — Telephone Encounter (Signed)
Please review for refill patient is past due for 6 mo f/u LOV 12/19

## 2018-12-30 ENCOUNTER — Ambulatory Visit: Payer: Medicare HMO | Admitting: Nurse Practitioner

## 2019-01-06 ENCOUNTER — Encounter: Payer: Self-pay | Admitting: Nurse Practitioner

## 2019-01-06 ENCOUNTER — Ambulatory Visit: Payer: Medicare HMO | Admitting: Nurse Practitioner

## 2019-01-06 NOTE — Progress Notes (Deleted)
Office Visit    Patient Name: Tina Peters Date of Encounter: 01/06/2019  Primary Care Provider:  Donnie Coffin, MD Primary Cardiologist:  [name], MD  Chief Complaint    68 year old female with a history of PSVT, paroxysmal atrial fibrillation with chronic Xarelto anticoagulation (CHA2DS2VASc equals 3), hypertension, hyperlipidemia, asthma, tobacco abuse, and chronic back pain, who presents for follow-up of A. fib.  Past Medical History    Past Medical History:  Diagnosis Date   Asthma    Chronic back pain    Diverticulitis    Hyperlipidemia    Hypertension    Lipoma    Mild mitral regurgitation    a. 08/2016 Echo: mild MR.   Obesity    PAF (paroxysmal atrial fibrillation) ([place])    a. Dx 05/2016--> Xarelto (CHA2DS2VASc = 3); b. 08/2016 Echo: EF 55-60%, no rwma, mild MR, mod dil LA.   Paroxysmal SVT (supraventricular tachycardia) ([facility])    Past Surgical History:  Procedure Laterality Date   ACHILLES TENDON SURGERY     BACK SURGERY     KNEE ARTHROSCOPY W/ MENISCAL REPAIR     KNEE SURGERY     OVARY SURGERY     TUMOR REMOVAL     neck    Allergies  No Known Allergies  History of Present Illness    68 year old female with the above past medical history including paroxysmal atrial fibrillation which was diagnosed in April 2018 (CHA2DS2VASc equals 3  Xarelto), PSVT in 2016 in the setting of hypokalemia, hypertension, hyperlipidemia, asthma, sleep apnea, tobacco abuse, and chronic back pain.  As noted, she was initially diagnosed with PSVT in 2016 and was initially managed with calcium channel blocker therapy however, this resulted in lower extremity edema and was subsequently switched to beta-blocker.  In January 2018, she developed atrial fibrillation with rapid ventricular response and she was placed on Xarelto.  Her Toprol dose was titrated.  Echocardiogram at that time showed normal LV function with mild mitral regurgitation and moderately dilated  left atrium.  She was last seen in clinic in December 2019, at which time she reported intermittent palpitations that she identified as A. fib.  She felt as though symptoms were stable and was not interested in cardiac monitoring to evaluate burden. ECHO @ F/U  Home Medications    Prior to Admission medications   Medication Sig Start Date End Date Taking? Authorizing Provider  acetaminophen (TYLENOL) 325 MG tablet Take 650 mg by mouth every 4 (four) hours as needed.     [provider]  albuterol (PROVENTIL HFA;VENTOLIN HFA) 108 (90 Base) MCG/ACT inhaler 2 puffs q.i.d. p.r.n. short of breath, wheezing, or cough 07/04/15   [provider]  albuterol (PROVENTIL HFA;VENTOLIN HFA) 108 (90 Base) MCG/ACT inhaler Inhale 2 puffs into the lungs every 6 (six) hours as needed for wheezing or shortness of breath. 07/19/18   Merlyn Lot, MD  ASPIRIN 81 PO Take 81 mg by mouth daily as needed.     [provider]  citalopram (CELEXA) 20 MG tablet Take 20 mg by mouth daily.    [provider]  cyclobenzaprine (FLEXERIL) 10 MG tablet Take 10 mg by mouth 3 (three) times daily as needed. for muscle spams 01/21/18   [provider]  HYDROcodone-acetaminophen (NORCO) 5-325 MG tablet Take 1 tablet by mouth every 4 (four) hours as needed for moderate pain. 07/19/18   Merlyn Lot, MD  ibuprofen (ADVIL,MOTRIN) 200 MG tablet Take 400 mg by mouth as needed.  [provider]  lidocaine (LIDODERM) 5 % Place 1 patch onto the skin every 12 (twelve) hours. Remove & Discard patch within 12 hours or as directed by MD 07/19/18 07/19/19  Merlyn Lot, MD  metoprolol succinate (TOPROL-XL) 100 MG 24 hr tablet TAKE 1 TABLET DAILY WITH OR IMMEDIATELY FOLLOWING A MEAL. (MUST KEEP APPOINTMENT FOR FUTURE REFILLS) 11/23/18   Wellington Hampshire, MD  naproxen sodium (ALEVE) 220 MG tablet Take 220 mg by mouth daily as needed.    [provider]  rivaroxaban (XARELTO) 20  MG TABS tablet Take 1 tablet (20 mg total) by mouth daily with supper. 08/15/16   Wellington Hampshire, MD  traZODone (DESYREL) 50 MG tablet TAKE 1 TABLET BY MOUTH AT BEDTIME AS NEEDED FOR SLEEP MAY INCREASE TO 2 TABLETS AT BEDTIME IF NOT IMPROVED 03/11/18   [provider]    Review of Systems    ***.  All other systems reviewed and are otherwise negative except as noted above.  Physical Exam    VS:  There were no vitals taken for this visit. , BMI There is no height or weight on file to calculate BMI. GEN: Well nourished, well developed, in no acute distress. HEENT: normal. Neck: Supple, no JVD, carotid bruits, or masses. Cardiac: RRR, no murmurs, rubs, or gallops. No clubbing, cyanosis, edema.  Radials/DP/PT 2+ and equal bilaterally.  Respiratory:  Respirations regular and unlabored, clear to auscultation bilaterally. GI: Soft, nontender, nondistended, BS + x 4. MS: no deformity or atrophy. Skin: warm and dry, no rash. Neuro:  Strength and sensation are intact. Psych: Normal affect.  Accessory Clinical Findings    ECG personally reviewed by me today - *** - no acute changes.  Lab Results  Component Value Date   WBC 6.7 07/19/2018   HGB 13.9 07/19/2018   HCT 43.3 07/19/2018   MCV 89.5 07/19/2018   PLT 200 07/19/2018   Lab Results  Component Value Date   CREATININE 0.96 07/19/2018   BUN 14 07/19/2018   NA 138 07/19/2018   K 3.9 07/19/2018   CL 107 07/19/2018   CO2 23 07/19/2018   No results found for: ALT, AST, GGT, ALKPHOS, BILITOT Lab Results  Component Value Date   CHOL 194 06/01/2016   HDL 36 (L) 06/01/2016   LDLCALC 135 (H) 06/01/2016   TRIG 117 06/01/2016   CHOLHDL 5.4 06/01/2016     Assessment & Plan    1.  ***   Murray Hodgkins, NP 01/06/2019, 9:19 AM

## 2019-02-04 ENCOUNTER — Other Ambulatory Visit: Payer: Self-pay | Admitting: Family Medicine

## 2019-02-04 DIAGNOSIS — Z1231 Encounter for screening mammogram for malignant neoplasm of breast: Secondary | ICD-10-CM

## 2019-03-12 ENCOUNTER — Other Ambulatory Visit: Payer: Self-pay | Admitting: Cardiovascular Disease

## 2019-03-24 ENCOUNTER — Telehealth: Payer: Self-pay | Admitting: Cardiovascular Disease

## 2019-03-24 NOTE — Telephone Encounter (Signed)
Virtual Visit Pre-Appointment Phone Call  "(Name), I am calling you today to discuss your upcoming appointment. We are currently trying to limit exposure to the virus that causes COVID-19 by seeing patients at home rather than in the office."  1. "What is the BEST phone number to call the day of the visit?" - include this in appointment notes  2. Do you have or have access to (through a family member/friend) a smartphone with video capability that we can use for your visit?" a. If yes - list this number in appt notes as cell (if different from BEST phone #) and list the appointment type as a VIDEO visit in appointment notes b. If no - list the appointment type as a PHONE visit in appointment notes  3. Confirm consent - "In the setting of the current Covid19 crisis, you are scheduled for a (phone or video) visit with your provider on (date) at (time).  Just as we do with many in-office visits, in order for you to participate in this visit, we must obtain consent.  If you'd like, I can send this to your mychart (if signed up) or email for you to review.  Otherwise, I can obtain your verbal consent now.  All virtual visits are billed to your insurance company just like a normal visit would be.  By agreeing to a virtual visit, we'd like you to understand that the technology does not allow for your provider to perform an examination, and thus may limit your provider's ability to fully assess your condition. If your provider identifies any concerns that need to be evaluated in person, we will make arrangements to do so.  Finally, though the technology is pretty good, we cannot assure that it will always work on either your or our end, and in the setting of a video visit, we may have to convert it to a phone-only visit.  In either situation, we cannot ensure that we have a secure connection.  Are you willing to proceed?" STAFF: Did the patient verbally acknowledge consent to telehealth visit? Document  YES/NO here: YES  4. Advise patient to be prepared - "Two hours prior to your appointment, go ahead and check your blood pressure, pulse, oxygen saturation, and your weight (if you have the equipment to check those) and write them all down. When your visit starts, your provider will ask you for this information. If you have an Apple Watch or Kardia device, please plan to have heart rate information ready on the day of your appointment. Please have a pen and paper handy nearby the day of the visit as well."  5. Give patient instructions for MyChart download to smartphone OR [email or web address] as below if video visit (depending on what platform provider is using)  6. Inform patient they will receive a phone call 15 minutes prior to their appointment time (may be from unknown caller ID) so they should be prepared to answer    TELEPHONE CALL NOTE  Tina Peters has been deemed a candidate for a follow-up tele-health visit to limit community exposure during the Covid-19 pandemic. I spoke with the patient via phone to ensure availability of phone/video source, confirm preferred email & phone number, and discuss instructions and expectations.  I reminded Tina Peters to be prepared with any vital sign and/or heart rhythm information that could potentially be obtained via home monitoring, at the time of her visit. I reminded Tina Peters to expect a phone call prior to  her visit.  Clarisse Gouge 03/24/2019 10:36 AM   INSTRUCTIONS FOR DOWNLOADING THE MYCHART APP TO SMARTPHONE  - The patient must first make sure to have activated MyChart and know their login information - If Apple, go to [employer] and type in MyChart in the search bar and download the app. If Android, ask patient to go to [employer] and type in [place] in the search bar and download the app. The app is free but as with any other app downloads, their phone may require them to verify saved payment information or  Apple/Android password.  - The patient will need to then log into the app with their MyChart username and password, and select [facility] as their healthcare provider to link the account. When it is time for your visit, go to the MyChart app, find appointments, and click Begin Video Visit. Be sure to Select Allow for your device to access the Microphone and Camera for your visit. You will then be connected, and your provider will be with you shortly.  **If they have any issues connecting, or need assistance please contact MyChart service desk [phone])**  **If using a computer, in order to ensure the best quality for their visit they will need to use either of the following Internet Browsers: [employer], or Google Chrome**  IF USING DOXIMITY or DOXY.ME - The patient will receive a link just prior to their visit by text.     FULL LENGTH CONSENT FOR TELE-HEALTH VISIT   I hereby voluntarily request, consent and authorize [facility] and its employed or contracted physicians, physician assistants, nurse practitioners or other licensed health care professionals (the Practitioner), to provide me with telemedicine health care services (the Services") as deemed necessary by the treating Practitioner. I acknowledge and consent to receive the Services by the Practitioner via telemedicine. I understand that the telemedicine visit will involve communicating with the Practitioner through live audiovisual communication technology and the disclosure of certain medical information by electronic transmission. I acknowledge that I have been given the opportunity to request an in-person assessment or other available alternative prior to the telemedicine visit and am voluntarily participating in the telemedicine visit.  I understand that I have the right to withhold or withdraw my consent to the use of telemedicine in the course of my care at any time, without affecting my right to future care  or treatment, and that the Practitioner or I may terminate the telemedicine visit at any time. I understand that I have the right to inspect all information obtained and/or recorded in the course of the telemedicine visit and may receive copies of available information for a reasonable fee.  I understand that some of the potential risks of receiving the Services via telemedicine include:   Delay or interruption in medical evaluation due to technological equipment failure or disruption;  Information transmitted may not be sufficient (e.g. poor resolution of images) to allow for appropriate medical decision making by the Practitioner; and/or   In rare instances, security protocols could fail, causing a breach of personal health information.  Furthermore, I acknowledge that it is my responsibility to provide information about my medical history, conditions and care that is complete and accurate to the best of my ability. I acknowledge that Practitioner's advice, recommendations, and/or decision may be based on factors not within their control, such as incomplete or inaccurate data provided by me or distortions of diagnostic images or specimens that may result from electronic transmissions. I  understand that the practice of medicine is not an exact science and that Practitioner makes no warranties or guarantees regarding treatment outcomes. I acknowledge that I will receive a copy of this consent concurrently upon execution via email to the email address I last provided but may also request a printed copy by calling the office of [facility].    I understand that my insurance will be billed for this visit.   I have read or had this consent read to me.  I understand the contents of this consent, which adequately explains the benefits and risks of the Services being provided via telemedicine.   I have been provided ample opportunity to ask questions regarding this consent and the Services and have had  my questions answered to my satisfaction.  I give my informed consent for the services to be provided through the use of telemedicine in my medical care  By participating in this telemedicine visit I agree to the above.

## 2019-03-25 ENCOUNTER — Telehealth (INDEPENDENT_AMBULATORY_CARE_PROVIDER_SITE_OTHER): Payer: Medicare HMO | Admitting: Cardiovascular Disease

## 2019-03-25 ENCOUNTER — Other Ambulatory Visit: Payer: Self-pay

## 2019-03-25 ENCOUNTER — Encounter: Payer: Self-pay | Admitting: Cardiovascular Disease

## 2019-03-25 ENCOUNTER — Other Ambulatory Visit: Payer: Self-pay | Admitting: Cardiovascular Disease

## 2019-03-25 VITALS — BP 119/66 | HR 50 | Ht 65.0 in | Wt 235.0 lb

## 2019-03-25 DIAGNOSIS — Z720 Tobacco use: Secondary | ICD-10-CM

## 2019-03-25 DIAGNOSIS — I480 Paroxysmal atrial fibrillation: Secondary | ICD-10-CM | POA: Diagnosis not present

## 2019-03-25 DIAGNOSIS — I10 Essential (primary) hypertension: Secondary | ICD-10-CM

## 2019-03-25 MED ORDER — METOPROLOL SUCCINATE ER 100 MG PO TB24: ORAL_TABLET | ORAL | 3 refills | Status: DC

## 2019-03-25 NOTE — Patient Instructions (Signed)
Medication Instructions:  Continue same medications . *If you need a refill on your cardiac medications before your next appointment, please call your pharmacy*  Lab Work: None If you have labs (blood work) drawn today and your tests are completely normal, you will receive your results only by: . MyChart Message (if you have MyChart) OR . A paper copy in the mail If you have any lab test that is abnormal or we need to change your treatment, we will call you to review the results.  Testing/Procedures: None  Follow-Up: At [facility], you and your health needs are our priority.  As part of our continuing mission to provide you with exceptional heart care, we have created designated Provider Care Teams.  These Care Teams include your primary Cardiologist (physician) and Advanced Practice Providers (APPs -  Physician Assistants and Nurse Practitioners) who all work together to provide you with the care you need, when you need it.  Your next appointment:   6 month(s)  The format for your next appointment:   In Person  Provider:    You may see Angelik Walls, MD or one of the following Advanced Practice Providers on your designated Care Team:    Christopher Berge, NP  Ryan Dunn, PA-C  Jacquelyn Visser, PA-C    

## 2019-03-25 NOTE — Progress Notes (Signed)
Virtual Visit via Telephone Note   This visit type was conducted due to national recommendations for restrictions regarding the COVID-19 Pandemic (e.g. social distancing) in an effort to limit this patient's exposure and mitigate transmission in our community.  Due to her co-morbid illnesses, this patient is at least at moderate risk for complications without adequate follow up.  This format is felt to be most appropriate for this patient at this time.  The patient did not have access to video technology/had technical difficulties with video requiring transitioning to audio format only (telephone).  All issues noted in this document were discussed and addressed.  No physical exam could be performed with this format.  Please refer to the patient's chart for her  consent to telehealth for [facility].   Date:  03/25/2019   ID:  Tina Peters, DOB [birth year], MRN [id number]  Patient Location: Home Provider Location: Office  PCP:  Donnie Coffin, MD  Cardiologist:  [name], MD  Electrophysiologist:  None   Evaluation Performed:  Follow-Up Visit  Chief Complaint: Doing well with no complaints  History of Present Illness:    Tina Peters is a 68 y.o. female was reached via phone for follow-up visit regarding paroxysmal supraventricular tachycardia and paroxysmal atrial fibrillation. She has known history of hypertension, hyperlipidemia and tobacco use. There is no family history of premature coronary artery disease. She had an episode of supraventricular tachycardia in 2016 in the setting of hypokalemia thought to be due to treatment with hydrochlorothiazide. She was placed on small dose extended release diltiazem with improvement in symptoms . It was switched to metoprolol  due to leg edema.  She has known history of sleep apnea on CPAP. She had atrial fibrillation with rapid ventricular response in 05/2016.  She converted to sinus rhythm with diltiazem. The patient was started on  Xarelto for anticoagulation. The dose of Toprol was increased.  Echocardiogram showed normal LV systolic function with an EF of 55-60%, mild mitral regurgitation and moderately dilated left atrium.  She has been doing well with no recent chest pain, or dizziness.  She did have an episode of tachycardia few weeks ago that lasted for about 1 hour.  Overall, she reports less than 2 episodes of tachycardia a month.  She takes Xarelto regularly. She continues to smoke.  The patient does not have symptoms concerning for COVID-19 infection (fever, chills, cough, or new shortness of breath).    Past Medical History:  Diagnosis Date  . Asthma   . Chronic back pain   . Diverticulitis   . Hyperlipidemia   . Hypertension   . Lipoma   . Mild mitral regurgitation    a. 08/2016 Echo: mild MR.  . Obesity   . PAF (paroxysmal atrial fibrillation) ([place])    a. Dx 05/2016--> Xarelto (CHA2DS2VASc = 3); b. 08/2016 Echo: EF 55-60%, no rwma, mild MR, mod dil LA.  . Paroxysmal SVT (supraventricular tachycardia) (HCC)    Past Surgical History:  Procedure Laterality Date  . ACHILLES TENDON SURGERY    . BACK SURGERY    . KNEE ARTHROSCOPY W/ MENISCAL REPAIR    . KNEE SURGERY    . OVARY SURGERY    . TUMOR REMOVAL     neck     Current Meds  Medication Sig  . acetaminophen (TYLENOL) 325 MG tablet Take 650 mg by mouth every 4 (four) hours as needed.   Marland Kitchen albuterol (PROVENTIL HFA;VENTOLIN HFA) 108 (90 Base) MCG/ACT inhaler 2 puffs  q.i.d. p.r.n. short of breath, wheezing, or cough  . albuterol (PROVENTIL HFA;VENTOLIN HFA) 108 (90 Base) MCG/ACT inhaler Inhale 2 puffs into the lungs every 6 (six) hours as needed for wheezing or shortness of breath.  . ASPIRIN 81 PO Take 81 mg by mouth daily as needed.   . citalopram (CELEXA) 20 MG tablet Take 20 mg by mouth daily.  Marland Kitchen ibuprofen (ADVIL,MOTRIN) 200 MG tablet Take 400 mg by mouth as needed.  . lidocaine (LIDODERM) 5 % Place 1 patch onto the skin every 12 (twelve)  hours. Remove & Discard patch within 12 hours or as directed by MD  . metoprolol succinate (TOPROL-XL) 100 MG 24 hr tablet TAKE 1 TABLET DAILY WITH OR IMMEDIATELY FOLLOWING A MEAL. (MUST KEEP APPOINTMENT FOR FUTURE REFILLS)  . naproxen sodium (ALEVE) 220 MG tablet Take 220 mg by mouth daily as needed.  . rivaroxaban (XARELTO) 20 MG TABS tablet Take 1 tablet (20 mg total) by mouth daily with supper.  . traZODone (DESYREL) 50 MG tablet TAKE 1 TABLET BY MOUTH AT BEDTIME AS NEEDED FOR SLEEP MAY INCREASE TO 2 TABLETS AT BEDTIME IF NOT IMPROVED     Allergies:   Patient has no known allergies.   Social History   Tobacco Use  . Smoking status: Current Some Day Smoker    Packs/day: 0.25    Years: 40.00    Pack years: 10.00    Types: Cigarettes  . Smokeless tobacco: Never Used  . Tobacco comment: Used to smoke heavily, cut back in 2015  Substance Use Topics  . Alcohol use: Yes    Alcohol/week: 0.0 standard drinks    Comment: socially, 1-2 drinks/week  . Drug use: No     Family Hx: The patient's family history includes Diabetes in her mother.  ROS:   Please see the history of present illness.     All other systems reviewed and are negative.   Prior CV studies:   The following studies were reviewed today:    Labs/Other Tests and Data Reviewed:    EKG:  No ECG reviewed.  Recent Labs: 07/19/2018: BUN 14; Creatinine, Ser 0.96; Hemoglobin 13.9; Platelets 200; Potassium 3.9; Sodium 138   Recent Lipid Panel Lab Results  Component Value Date/Time   CHOL 194 06/01/2016 02:32 AM   TRIG 117 06/01/2016 02:32 AM   HDL 36 (L) 06/01/2016 02:32 AM   CHOLHDL 5.4 06/01/2016 02:32 AM   LDLCALC 135 (H) 06/01/2016 02:32 AM    Wt Readings from Last 3 Encounters:  03/25/19 235 lb (106.6 kg)  07/19/18 235 lb (106.6 kg)  04/15/18 228 lb (103.4 kg)     Objective:    Vital Signs:  BP 119/66   Pulse (!) 50   Ht 5\' 5"  (1.651 m)   Wt 235 lb (106.6 kg)   BMI 39.11 kg/m    VITAL SIGNS:   reviewed  ASSESSMENT & PLAN:    1.Paroxysmal atrial fibrillation: Symptoms are well controlled on current dose of Toprol.   CHADS VASc score is 3.  She is tolerating anticoagulation with Xarelto with no bleeding side effects.  I reviewed her labs done in March which were unremarkable.    2. Essential hypertension: Blood pressure is reasonably controlled  3. Sleep apnea: Currently using CPAP.  4. Anxiety: Improved with citalopram.  5.  Tobacco use: I discussed the importance of smoking cessation but she has not been able to quit.   COVID-19 Education: The signs and symptoms of COVID-19 were discussed with  the patient and how to seek care for testing (follow up with PCP or arrange E-visit).  The importance of social distancing was discussed today.  Time:   Today, I have spent 8 minutes with the patient with telehealth technology discussing the above problems.     Medication Adjustments/Labs and Tests Ordered: Current medicines are reviewed at length with the patient today.  Concerns regarding medicines are outlined above.   Tests Ordered: No orders of the defined types were placed in this encounter.   Medication Changes: No orders of the defined types were placed in this encounter.   Follow Up:  In Person in 6 month(s)  Signed, [name], MD  03/25/2019 9:04 AM    [place]

## 2019-10-07 ENCOUNTER — Ambulatory Visit: Payer: Medicare HMO | Admitting: Cardiovascular Disease

## 2020-01-25 ENCOUNTER — Other Ambulatory Visit: Payer: Self-pay | Admitting: Family Medicine

## 2020-01-25 DIAGNOSIS — M8580 Other specified disorders of bone density and structure, unspecified site: Secondary | ICD-10-CM

## 2020-01-25 DIAGNOSIS — Z1231 Encounter for screening mammogram for malignant neoplasm of breast: Secondary | ICD-10-CM

## 2020-01-25 DIAGNOSIS — Z780 Asymptomatic menopausal state: Secondary | ICD-10-CM

## 2020-03-01 ENCOUNTER — Telehealth: Payer: Self-pay | Admitting: Cardiovascular Disease

## 2020-03-01 ENCOUNTER — Observation Stay: Payer: Medicare HMO

## 2020-03-01 ENCOUNTER — Ambulatory Visit: Payer: Medicare HMO | Admitting: Nurse Practitioner

## 2020-03-01 ENCOUNTER — Other Ambulatory Visit: Payer: Self-pay

## 2020-03-01 ENCOUNTER — Encounter: Payer: Self-pay | Admitting: Nurse Practitioner

## 2020-03-01 ENCOUNTER — Emergency Department: Payer: Medicare HMO

## 2020-03-01 ENCOUNTER — Observation Stay
Admission: EM | Admit: 2020-03-01 | Discharge: 2020-03-02 | Disposition: A | Discharge status: Home / Self Care | Payer: Medicare HMO | Attending: Internal Medicine | Admitting: Internal Medicine

## 2020-03-01 VITALS — BP 126/74 | HR 51 | Ht 65.0 in | Wt 220.0 lb

## 2020-03-01 DIAGNOSIS — N390 Urinary tract infection, site not specified: Secondary | ICD-10-CM | POA: Diagnosis not present

## 2020-03-01 DIAGNOSIS — Z720 Tobacco use: Secondary | ICD-10-CM

## 2020-03-01 DIAGNOSIS — I639 Cerebral infarction, unspecified: Secondary | ICD-10-CM | POA: Diagnosis not present

## 2020-03-01 DIAGNOSIS — F17210 Nicotine dependence, cigarettes, uncomplicated: Secondary | ICD-10-CM | POA: Insufficient documentation

## 2020-03-01 DIAGNOSIS — Z7901 Long term (current) use of anticoagulants: Secondary | ICD-10-CM | POA: Insufficient documentation

## 2020-03-01 DIAGNOSIS — R001 Bradycardia, unspecified: Secondary | ICD-10-CM

## 2020-03-01 DIAGNOSIS — Z7982 Long term (current) use of aspirin: Secondary | ICD-10-CM | POA: Insufficient documentation

## 2020-03-01 DIAGNOSIS — I480 Paroxysmal atrial fibrillation: Secondary | ICD-10-CM | POA: Diagnosis not present

## 2020-03-01 DIAGNOSIS — Z79899 Other long term (current) drug therapy: Secondary | ICD-10-CM | POA: Insufficient documentation

## 2020-03-01 DIAGNOSIS — G4733 Obstructive sleep apnea (adult) (pediatric): Secondary | ICD-10-CM

## 2020-03-01 DIAGNOSIS — I471 Supraventricular tachycardia: Secondary | ICD-10-CM

## 2020-03-01 DIAGNOSIS — J45909 Unspecified asthma, uncomplicated: Secondary | ICD-10-CM | POA: Insufficient documentation

## 2020-03-01 DIAGNOSIS — I10 Essential (primary) hypertension: Secondary | ICD-10-CM | POA: Diagnosis not present

## 2020-03-01 DIAGNOSIS — F17200 Nicotine dependence, unspecified, uncomplicated: Secondary | ICD-10-CM

## 2020-03-01 DIAGNOSIS — Z20822 Contact with and (suspected) exposure to covid-19: Secondary | ICD-10-CM | POA: Insufficient documentation

## 2020-03-01 DIAGNOSIS — R404 Transient alteration of awareness: Secondary | ICD-10-CM

## 2020-03-01 DIAGNOSIS — R410 Disorientation, unspecified: Secondary | ICD-10-CM | POA: Diagnosis present

## 2020-03-01 DIAGNOSIS — Z8673 Personal history of transient ischemic attack (TIA), and cerebral infarction without residual deficits: Secondary | ICD-10-CM

## 2020-03-01 DIAGNOSIS — R55 Syncope and collapse: Secondary | ICD-10-CM

## 2020-03-01 LAB — URINALYSIS, COMPLETE (UACMP) WITH MICROSCOPIC
Bilirubin Urine: NEGATIVE
Glucose, UA: NEGATIVE mg/dL
Ketones, ur: NEGATIVE mg/dL
Nitrite: NEGATIVE
Protein, ur: NEGATIVE mg/dL
Specific Gravity, Urine: 1.014 (ref 1.005–1.030)
WBC, UA: >50 WBC/hpf — ABNORMAL HIGH (ref 0–5)
pH: 5 (ref 5.0–8.0)

## 2020-03-01 LAB — BASIC METABOLIC PANEL
Anion gap: 11 (ref 5–15)
BUN: 16 mg/dL (ref 8–23)
CO2: 25 mmol/L (ref 22–32)
Calcium: 9.5 mg/dL (ref 8.9–10.3)
Chloride: 100 mmol/L (ref 98–111)
Creatinine, Ser: 1.13 mg/dL — ABNORMAL HIGH (ref 0.44–1.00)
GFR, Estimated: 53 mL/min — ABNORMAL LOW (ref >60)
Glucose, Bld: 98 mg/dL (ref 70–99)
Potassium: 4.3 mmol/L (ref 3.5–5.1)
Sodium: 136 mmol/L (ref 135–145)

## 2020-03-01 LAB — CBC WITH DIFFERENTIAL/PLATELET
Abs Immature Granulocytes: 0.04 10*3/uL (ref 0.00–0.07)
Basophils Absolute: 0 10*3/uL (ref 0.0–0.1)
Basophils Relative: 0 %
Eosinophils Absolute: 0.1 10*3/uL (ref 0.0–0.5)
Eosinophils Relative: 1 %
HCT: 43.1 % (ref 36.0–46.0)
Hemoglobin: 14 g/dL (ref 12.0–15.0)
Immature Granulocytes: 0 %
Lymphocytes Relative: 26 %
Lymphs Abs: 2.6 10*3/uL (ref 0.7–4.0)
MCH: 29.2 pg (ref 26.0–34.0)
MCHC: 32.5 g/dL (ref 30.0–36.0)
MCV: 89.8 fL (ref 80.0–100.0)
Monocytes Absolute: 0.6 10*3/uL (ref 0.1–1.0)
Monocytes Relative: 6 %
Neutro Abs: 6.6 10*3/uL (ref 1.7–7.7)
Neutrophils Relative %: 67 %
Platelets: 238 10*3/uL (ref 150–400)
RBC: 4.8 MIL/uL (ref 3.87–5.11)
RDW: 13.1 % (ref 11.5–15.5)
WBC: 9.9 10*3/uL (ref 4.0–10.5)
nRBC: 0 % (ref 0.0–0.2)

## 2020-03-01 LAB — RESPIRATORY PANEL BY RT PCR (FLU A&B, COVID)
Influenza A by PCR: NEGATIVE
Influenza B by PCR: NEGATIVE
SARS Coronavirus 2 by RT PCR: NEGATIVE

## 2020-03-01 LAB — TROPONIN I (HIGH SENSITIVITY): Troponin I (High Sensitivity): 10 ng/L (ref <18)

## 2020-03-01 MED ORDER — SODIUM CHLORIDE 0.9 % IV SOLN
1.0000 g | INTRAVENOUS | Status: DC
  Administered 2020-03-02: 1 g via INTRAVENOUS
  Filled 2020-03-01: qty 10

## 2020-03-01 MED ORDER — ACETAMINOPHEN 650 MG RE SUPP: 650.0000 mg | RECTAL | Status: DC | PRN

## 2020-03-01 MED ORDER — ASPIRIN 81 MG PO CHEW
324.0000 mg | CHEWABLE_TABLET | Freq: Once | ORAL | Status: AC
  Administered 2020-03-01: 324 mg via ORAL
  Filled 2020-03-01: qty 4

## 2020-03-01 MED ORDER — ASPIRIN EC 81 MG PO TBEC
81.0000 mg | DELAYED_RELEASE_TABLET | Freq: Every day | ORAL | Status: DC
  Administered 2020-03-02: 81 mg via ORAL
  Filled 2020-03-01: qty 1

## 2020-03-01 MED ORDER — ACETAMINOPHEN 325 MG PO TABS: 650.0000 mg | ORAL_TABLET | ORAL | Status: DC | PRN

## 2020-03-01 MED ORDER — ACETAMINOPHEN 160 MG/5ML PO SOLN
650.0000 mg | ORAL | Status: DC | PRN
  Filled 2020-03-01: qty 20.3

## 2020-03-01 MED ORDER — SODIUM CHLORIDE 0.9 % IV SOLN: Freq: Once | INTRAVENOUS | Status: AC

## 2020-03-01 MED ORDER — METOPROLOL SUCCINATE ER 100 MG PO TB24: 50.0000 mg | ORAL_TABLET | Freq: Every day | ORAL | 0 refills | Status: DC

## 2020-03-01 MED ORDER — ASPIRIN 81 MG PO CHEW: 324.0000 mg | CHEWABLE_TABLET | Freq: Once | ORAL | Status: DC

## 2020-03-01 MED ORDER — RIVAROXABAN 20 MG PO TABS
20.0000 mg | ORAL_TABLET | Freq: Every day | ORAL | Status: DC
  Filled 2020-03-01: qty 1

## 2020-03-01 MED ORDER — STROKE: EARLY STAGES OF RECOVERY BOOK: Freq: Once | Status: AC

## 2020-03-01 MED ORDER — SENNOSIDES-DOCUSATE SODIUM 8.6-50 MG PO TABS: 1.0000 | ORAL_TABLET | Freq: Every evening | ORAL | Status: DC | PRN

## 2020-03-01 MED ORDER — METOPROLOL SUCCINATE ER 50 MG PO TB24
50.0000 mg | ORAL_TABLET | Freq: Every day | ORAL | Status: DC
  Administered 2020-03-01: 50 mg via ORAL
  Filled 2020-03-01: qty 1

## 2020-03-01 MED ORDER — ATORVASTATIN CALCIUM 20 MG PO TABS
40.0000 mg | ORAL_TABLET | Freq: Every day | ORAL | Status: DC
  Filled 2020-03-01: qty 2

## 2020-03-01 MED ORDER — SODIUM CHLORIDE 0.9 % IV SOLN: 1.0000 g | Freq: Once | INTRAVENOUS | Status: DC

## 2020-03-01 NOTE — H&P (Signed)
History and Physical    Tina Peters [id number] DOB: 03/26/1951 DOA: 03/01/2020  PCP: Donnie Coffin, MD   Patient coming from: Home  I have personally briefly reviewed patient's old medical records in [place]  Chief Complaint: Episodes of lightheadedness and confusion x3 days  HPI: Tina Peters is a 69 y.o. female with medical history significant for paroxysmal A. fib on Xarelto, paroxysmal SVT, OSA, nicotine dependence, HTN and HLD, who was sent in by her cardiology office after she presented with a 3-day history of intermittent confusion,  intermittent visual disturbance associated with lightheadedness.  The symptoms started 3 days ago with confusion while she was playing cards, and  lasting just a few seconds.  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  She also felt like she could not think of how to get home from work and the boss had to call her daughter.  The following day she started having an intermittent headache, confusion with putting on her mask. Denies recent illness, fever or chills and states she was in her usual state of health until the episodes began 3 days ago. She does report urinary frequency and incontinence. Daughter contributes to the history, ED Course: On arrival in the ED, BP 187/84 with otherwise normal vitals. EKG as reviewed by me : Sinus bradycardia at 53 with PACs and no acute ST-T wave changes Blood work including BMP and CBC were unremarkable except for minimally elevated creatinine at 1.13.  Troponin was normal at 10.  Head CT showed findings concerning for acute or early subacute infarct with recommendation for MRI.  MRI MRA ordered from the ER and results pending.  Hospitalist consulted for admission. Review of Systems: As per HPI otherwise all other systems on review of systems negative.    Past Medical History:   Diagnosis Date  . Asthma   . Chronic back pain   . Diverticulitis   . Hyperlipidemia   . Hypertension   . Lipoma   . Mild mitral regurgitation    a. 08/2016 Echo: mild MR.  . Obesity   . Obstructive sleep apnea   . PAF (paroxysmal atrial fibrillation) ([place])    a. Dx 05/2016--> Xarelto (CHA2DS2VASc = 3); b. 08/2016 Echo: EF 55-60%, no rwma, mild MR, mod dil LA.  . Paroxysmal SVT (supraventricular tachycardia) ([place])   . Tobacco abuse     Past Surgical History:  Procedure Laterality Date  . ACHILLES TENDON SURGERY    . BACK SURGERY    . KNEE ARTHROSCOPY W/ MENISCAL REPAIR    . KNEE SURGERY    . OVARY SURGERY    . TUMOR REMOVAL     neck     reports that she has been smoking cigarettes. She has a 10.00 pack-year smoking history. She has never used smokeless tobacco. She reports current alcohol use. She reports that she does not use drugs.  No Known Allergies  Family History  Problem Relation Age of Onset  . Diabetes Mother       Prior to Admission medications   Medication Sig Start Date End Date Taking? Authorizing Provider  acetaminophen (TYLENOL) 325 MG tablet Take 650 mg by mouth every 4 (four) hours as needed.     [provider]  albuterol (PROVENTIL HFA;VENTOLIN HFA) 108 (90 Base) MCG/ACT inhaler 2 puffs q.i.d. p.r.n. short of breath, wheezing, or  cough 07/04/15   [provider]  albuterol (PROVENTIL HFA;VENTOLIN HFA) 108 (90 Base) MCG/ACT inhaler Inhale 2 puffs into the lungs every 6 (six) hours as needed for wheezing or shortness of breath. 07/19/18   Merlyn Lot, MD  ASPIRIN 81 PO Take 81 mg by mouth daily as needed.     [provider]  citalopram (CELEXA) 20 MG tablet Take 20 mg by mouth daily.    [provider]  HYDROcodone-acetaminophen (NORCO) 5-325 MG tablet Take 1 tablet by mouth every 4 (four) hours as needed for moderate pain. Patient not taking: Reported on 03/25/2019 07/19/18   Merlyn Lot, MD  ibuprofen  (ADVIL,MOTRIN) 200 MG tablet Take 400 mg by mouth as needed.    [provider]  metoprolol succinate (TOPROL-XL) 100 MG 24 hr tablet Take 0.5 tablets (50 mg total) by mouth daily. Take with or immediately following a meal. 03/01/20   Theora Gianotti, NP  naproxen sodium (ALEVE) 220 MG tablet Take 220 mg by mouth daily as needed.    [provider]  rivaroxaban (XARELTO) 20 MG TABS tablet Take 1 tablet (20 mg total) by mouth daily with supper. 08/15/16   Wellington Hampshire, MD  traZODone (DESYREL) 50 MG tablet TAKE 1 TABLET BY MOUTH AT BEDTIME AS NEEDED FOR SLEEP MAY INCREASE TO 2 TABLETS AT BEDTIME IF NOT IMPROVED 03/11/18   [provider]    Physical Exam: Vitals:   03/01/20 1545 03/01/20 1546  BP: (!) 187/84   Pulse: (!) 53   Resp: 20   Temp: 98.7 F (37.1 C)   TempSrc: Oral   SpO2: 98%   Weight:  99 kg  Height:  5\' 5"  (1.651 m)     Vitals:   03/01/20 1545 03/01/20 1546  BP: (!) 187/84   Pulse: (!) 53   Resp: 20   Temp: 98.7 F (37.1 C)   TempSrc: Oral   SpO2: 98%   Weight:  99 kg  Height:  5\' 5"  (1.651 m)      Constitutional: Alert and oriented x 3 . Not in any apparent distress HEENT:      Head: Normocephalic and atraumatic.         Eyes: PERLA, EOMI, Conjunctivae are normal. Sclera is non-icteric.       Mouth/Throat: Mucous membranes are moist.       Neck: Supple with no signs of meningismus. Cardiovascular: Regular rate and rhythm. No murmurs, gallops, or rubs. 2+ symmetrical distal pulses are present . No JVD. No LE edema Respiratory: Respiratory effort normal .Lungs sounds clear bilaterally. No wheezes, crackles, or rhonchi.  Gastrointestinal: Soft, non tender, and non distended with positive bowel sounds. No rebound or guarding. Genitourinary: No CVA tenderness. Musculoskeletal: Nontender with normal range of motion in all extremities. No cyanosis, or erythema of extremities. Neurologic:  Face is symmetric. Moving all  extremities. No gross focal neurologic deficits . Skin: Skin is warm, dry.  No rash or ulcers Psychiatric: Mood and affect are normal    Labs on Admission: I have personally reviewed following labs and imaging studies  CBC: Recent Labs  Lab 03/01/20 1556  WBC 9.9  NEUTROABS 6.6  HGB 14.0  HCT 43.1  MCV 89.8  PLT 503   Basic Metabolic Panel: Recent Labs  Lab 03/01/20 1556  NA 136  K 4.3  CL 100  CO2 25  GLUCOSE 98  BUN 16  CREATININE 1.13*  CALCIUM 9.5   GFR: Estimated Creatinine Clearance: 54.7 mL/min (  A) (by C-G formula based on SCr of 1.13 mg/dL (H)). Liver Function Tests: No results for input(s): AST, ALT, ALKPHOS, BILITOT, PROT, ALBUMIN in the last 168 hours. No results for input(s): LIPASE, AMYLASE in the last 168 hours. No results for input(s): AMMONIA in the last 168 hours. Coagulation Profile: No results for input(s): INR, PROTIME in the last 168 hours. Cardiac Enzymes: No results for input(s): CKTOTAL, CKMB, CKMBINDEX, TROPONINI in the last 168 hours. BNP (last 3 results) No results for input(s): PROBNP in the last 8760 hours. HbA1C: No results for input(s): HGBA1C in the last 72 hours. CBG: No results for input(s): GLUCAP in the last 168 hours. Lipid Profile: No results for input(s): CHOL, HDL, LDLCALC, TRIG, CHOLHDL, LDLDIRECT in the last 72 hours. Thyroid Function Tests: No results for input(s): TSH, T4TOTAL, FREET4, T3FREE, THYROIDAB in the last 72 hours. Anemia Panel: No results for input(s): VITAMINB12, FOLATE, FERRITIN, TIBC, IRON, RETICCTPCT in the last 72 hours. Urine analysis: No results found for: COLORURINE, APPEARANCEUR, [place], [place], GLUCOSEU, HGBUR, BILIRUBINUR, KETONESUR, PROTEINUR, UROBILINOGEN, NITRITE, LEUKOCYTESUR  Radiological Exams on Admission: CT Head Wo Contrast  Result Date: 03/01/2020 CLINICAL DATA:  Delirium. EXAM: CT HEAD WITHOUT CONTRAST TECHNIQUE: Contiguous axial images were obtained from the base of the skull  through the vertex without intravenous contrast. COMPARISON:  None. FINDINGS: Brain: Focal hypodensity and loss of gray-white differentiation in the high right frontal lobe. No substantial mass effect. No acute hemorrhage. No hydrocephalus. No mass lesion. Mild white matter hypodensities likely represents quell of chronic microvascular ischemic disease. Vascular: Calcific atherosclerosis. No definite hyperdense vessel. Mild hyperdensity of the right MCA is favored to relate to streak artifact. Skull: No acute fracture. Sinuses/Orbits: Right frontal sinus osteoma. No air-fluid levels. Unremarkable orbits. Other: No mastoid effusion. IMPRESSION: Focal hypodensity and loss of gray-white differentiation in the high right frontal lobe, concerning for acute or early subacute infarct. Recommend MRI for further evaluation. Findings discussed with Dr. Quentin Cornwall at 4:25 p.m. via telephone. Electronically Signed   By: Margaretha Sheffield MD   On: 03/01/2020 16:56     Assessment/Plan 69 year old female with history of paroxysmal A. fib on Xarelto, paroxysmal SVT, OSA, nicotine dependence, HTN and HLD, referred from our cardiology after presenting with a 3-day history of intermittent episodes of confusion, lightheadedness.CT showing acute/subacute CVA.    Acute CVA (cerebrovascular accident) ([place]) -CT head showing high right frontal lobe focal hypodensity concerning for acute or subacute -Due to symptom onset 3 days ago, outside of TPA window -Patient is currently on Xarelto for A. fib -We will add low-dose aspirin and continue statin pending further recommendations from neurology -Permissive hypertension for 24 to 48 hours -Neurology consult -discussed with Dr. Lorrin Goodell on secure chat.  Recommended EEG and monitor for any seizure activity.  If seizure, load with Keppra at 20 mg/kg -Follow-up MRI/MRA, echo, carotid Doppler,EEG    AF (paroxysmal atrial fibrillation) (H[facility])   Chronic anticoagulation -Patient  currently in sinus rhythm -Continue Xarelto  -We will hold Toprol due to bradycardia on EKG of 53    Paroxysmal SVT (supraventricular tachycardia) ([facility]) -Currently in sinus rhythm  UTI -- patient symptomatic for urinary frequency and urgency with incontinence --UA with pyuria --Rocephin and follow culture    Hypertension -Hold antihypertensives to allow permissive hypertension for the next 24 to 48 hours    OSA (obstructive sleep apnea) -CPAP if desired  Nicotine dependence -Nicotine patch if desired     DVT prophylaxis: On Xarelto Code Status: full code  Family  Communication: daughter at bedside Disposition Plan: Back to previous home environment Consults called: Neurology Status: Observation    Athena Masse MD [facility]     03/01/2020, 9:46 PM

## 2020-03-01 NOTE — Patient Instructions (Addendum)
Medication Instructions:   1. DECREASE Metoprolol - Take one half tablet (50MG ) by mouth daily.  *If you need a refill on your cardiac medications before your next appointment, please call your pharmacy*   Lab Work:  1. None Ordered  If you have labs (blood work) drawn today and your tests are completely normal, you will receive your results only by: Marland Kitchen MyChart Message (if you have MyChart) OR . A paper copy in the mail If you have any lab test that is abnormal or we need to change your treatment, we will call you to review the results.   Testing/Procedures:  1.  [place] AT: We will place order and you will receive it in the mail.  You may get a call from [employer] [place] @ either  [phone] for them to confirm your address before it will be sent to you.  You will wear the monitor for 14 days, remove it and send it back to the company. They will send [place] a report. Then we will call you with the results.   Follow-Up: At [facility], you and your health needs are our priority.  As part of our continuing mission to provide you with exceptional heart care, we have created designated Provider Care Teams.  These Care Teams include your primary Cardiologist (physician) and Advanced Practice Providers (APPs -  Physician Assistants and Nurse Practitioners) who all work together to provide you with the care you need, when you need it.  We recommend signing up for the patient portal called "MyChart".  Sign up information is provided on this After Visit Summary.  MyChart is used to connect with patients for Virtual Visits (Telemedicine).  Patients are able to view lab/test results, encounter notes, upcoming appointments, etc.  Non-urgent messages can be sent to your provider as well.   To learn more about what you can do with MyChart, go to [email or web address].    Your next appointment:   6 week(s)  The format for your next appointment:   In Person  Provider:   You may see  [name], MD or one of the following Advanced Practice Providers on your designated Care Team:    Murray Hodgkins, NP  Christell Faith, PA-C  Marrianne Mood, PA-C  Cadence Kathlen Mody, [place]    Other Instructions  Sent patient to ED for evaluation for altered mental status / headache / confusion.

## 2020-03-01 NOTE — Addendum Note (Signed)
Addended by: Janan Ridge on: 03/01/2020 03:21 PM   Modules accepted: Orders

## 2020-03-01 NOTE — ED Triage Notes (Signed)
Pt to ED referred by cardiologist, states has been having urinary frequency and "brain fog", cardiologist wants to make sure not having a brain bleed.  +headache  Takes xarelto.  Pt alert and oriented, clear speech

## 2020-03-01 NOTE — Telephone Encounter (Signed)
Spoke with the patient. Patient sts that her HR has dropped down to the high 40's a couple of times. Her HR last night 48 bpm.  Pt reports episodes of fatigue, "brain fog". She denies chest pain, sob, n/v, palpitations. She reports 2 episodes of near syncope. No symptoms associated with those events.  PT BP reading  this morning is listed below. She takes 100 toprol XL daily. She normally takes her medication around 11am. Adv the patient to HOLD metoprolol this morning. She is scheduled to see Ignacia Bayley, NP today at 1:55pm. Adv her to keep that appt. Gerald Stabs will make further medication changes if needed.  Patient verbalized understanding and voiced appreciation for the call.

## 2020-03-01 NOTE — Telephone Encounter (Signed)
STAT if patient feels like he/she is going to faint   1) Are you dizzy now? No intermittent for a while   2) Do you feel faint or have you passed out? No   3) Do you have any other symptoms?  Episode of brain fog shoulder blade / neck pain weakness no energy brady 40's - 50's BP 123/84 today   4) Have you checked your HR and BP (record if available)? HR 40's - 50's BP  123/84 today   Patient wants asap eval added to berge 155 pm .

## 2020-03-01 NOTE — Addendum Note (Signed)
Addended by: Janan Ridge on: 03/01/2020 03:31 PM   Modules accepted: Orders

## 2020-03-01 NOTE — ED Provider Notes (Signed)
[facility] Emergency Department Provider Note    First MD Initiated Contact with Patient 03/01/20 2057     (approximate)  I have reviewed the triage vital signs and the nursing notes.   HISTORY  Chief Complaint Urinary Frequency and Altered Mental Status    HPI Tina Peters is a 69 y.o. female below listed past medical history who presents to the ER for evaluation of confusion.  States that she is having episodes where she feels like she is about to pass out.  Has a history of A. fib on Xarelto.  Was in cardiology clinic today and was talking about how Sunday she was playing cards and for about 4 minutes of felt very confused lightheaded like she was about to pass out.  Denies any lateralizing weakness.  Has been having episodes of increasing forgetfulness and difficulty completing normal tasks like counting money which she typically has no issues with.  This was a sudden change.  In cardiology clinic she was evaluated discussed the symptoms and was directed to the ER due to concern for CVA.    Past Medical History:  Diagnosis Date  . Asthma   . Chronic back pain   . Diverticulitis   . Hyperlipidemia   . Hypertension   . Lipoma   . Mild mitral regurgitation    a. 08/2016 Echo: mild MR.  . Obesity   . Obstructive sleep apnea   . PAF (paroxysmal atrial fibrillation) ([place])    a. Dx 05/2016--> Xarelto (CHA2DS2VASc = 3); b. 08/2016 Echo: EF 55-60%, no rwma, mild MR, mod dil LA.  . Paroxysmal SVT (supraventricular tachycardia) ([place])   . Tobacco abuse    Family History  Problem Relation Age of Onset  . Diabetes Mother    Past Surgical History:  Procedure Laterality Date  . ACHILLES TENDON SURGERY    . BACK SURGERY    . KNEE ARTHROSCOPY W/ MENISCAL REPAIR    . KNEE SURGERY    . OVARY SURGERY    . TUMOR REMOVAL     neck   Patient Active Problem List   Diagnosis Date Noted  . PAC (premature atrial contraction)   . PAT (paroxysmal atrial  tachycardia) ([place])   . Atrial fibrillation ([place])   . Chronic venous insufficiency 09/29/2014  . SVT (supraventricular tachycardia) ([place]) 07/15/2014  . Hypokalemia 07/15/2014  . Hypertension       Prior to Admission medications   Medication Sig Start Date End Date Taking? Authorizing Provider  acetaminophen (TYLENOL) 325 MG tablet Take 650 mg by mouth every 4 (four) hours as needed.     [provider]  albuterol (PROVENTIL HFA;VENTOLIN HFA) 108 (90 Base) MCG/ACT inhaler 2 puffs q.i.d. p.r.n. short of breath, wheezing, or cough 07/04/15   [provider]  albuterol (PROVENTIL HFA;VENTOLIN HFA) 108 (90 Base) MCG/ACT inhaler Inhale 2 puffs into the lungs every 6 (six) hours as needed for wheezing or shortness of breath. 07/19/18   Merlyn Lot, MD  ASPIRIN 81 PO Take 81 mg by mouth daily as needed.     [provider]  citalopram (CELEXA) 20 MG tablet Take 20 mg by mouth daily.    [provider]  HYDROcodone-acetaminophen (NORCO) 5-325 MG tablet Take 1 tablet by mouth every 4 (four) hours as needed for moderate pain. Patient not taking: Reported on 03/25/2019 07/19/18   Merlyn Lot, MD  ibuprofen (ADVIL,MOTRIN) 200 MG tablet Take 400 mg by mouth as needed.    [provider]  metoprolol succinate (TOPROL-XL) 100 MG 24 hr tablet Take 0.5 tablets (50 mg total) by mouth daily. Take with or immediately following a meal. 03/01/20   Theora Gianotti, NP  naproxen sodium (ALEVE) 220 MG tablet Take 220 mg by mouth daily as needed.    [provider]  rivaroxaban (XARELTO) 20 MG TABS tablet Take 1 tablet (20 mg total) by mouth daily with supper. 08/15/16   Wellington Hampshire, MD  traZODone (DESYREL) 50 MG tablet TAKE 1 TABLET BY MOUTH AT BEDTIME AS NEEDED FOR SLEEP MAY INCREASE TO 2 TABLETS AT BEDTIME IF NOT IMPROVED 03/11/18   [provider]    Allergies Patient has no known allergies.    Social History Social History    Tobacco Use  . Smoking status: Current Some Day Smoker    Packs/day: 0.25    Years: 40.00    Pack years: 10.00    Types: Cigarettes  . Smokeless tobacco: Never Used  . Tobacco comment: Used to smoke heavily, cut back in 2015  Vaping Use  . Vaping Use: Never used  Substance Use Topics  . Alcohol use: Yes    Alcohol/week: 0.0 standard drinks    Comment: socially, 1-2 drinks/week  . Drug use: No    Review of Systems Patient denies headaches, rhinorrhea, blurry vision, numbness, shortness of breath, chest pain, edema, cough, abdominal pain, nausea, vomiting, diarrhea, dysuria, fevers, rashes or hallucinations unless otherwise stated above in HPI. ____________________________________________   PHYSICAL EXAM:  VITAL SIGNS: Vitals:   03/01/20 1545  BP: (!) 187/84  Pulse: (!) 53  Resp: 20  Temp: 98.7 F (37.1 C)  SpO2: 98%    Constitutional: Alert and oriented.  Eyes: Conjunctivae are normal.  Head: Atraumatic. Nose: No congestion/rhinnorhea. Mouth/Throat: Mucous membranes are moist.   Neck: No stridor. Painless ROM.  Cardiovascular: Normal rate, regular rhythm. Grossly normal heart sounds.  Good peripheral circulation. Respiratory: Normal respiratory effort.  No retractions. Lungs CTAB. Gastrointestinal: Soft and nontender. No distention. No abdominal bruits. No CVA tenderness. Genitourinary:  Musculoskeletal: No lower extremity tenderness nor edema.  No joint effusions. Neurologic:  CN- intact.  No facial droop, Normal FNF.  Normal heel to shin.  Sensation intact bilaterally. Normal speech and language. No gross focal neurologic deficits are appreciated. No gait instability. Skin:  Skin is warm, dry and intact. No rash noted. Psychiatric: Mood and affect are normal. Speech and behavior are normal.  ____________________________________________   LABS (all labs ordered are listed, but only abnormal results are displayed)  Results for orders placed or performed  during the hospital encounter of 03/01/20 (from the past 24 hour(s))  CBC with Differential     Status: None   Collection Time: 03/01/20  3:56 PM  Result Value Ref Range   WBC 9.9 4.0 - 10.5 K/uL   RBC 4.80 3.87 - 5.11 MIL/uL   Hemoglobin 14.0 12.0 - 15.0 g/dL   HCT 43.1 36 - 46 %   MCV 89.8 80.0 - 100.0 fL   MCH 29.2 26.0 - 34.0 pg   MCHC 32.5 30.0 - 36.0 g/dL   RDW 13.1 11.5 - 15.5 %   Platelets 238 150 - 400 K/uL   nRBC 0.0 0.0 - 0.2 %   Neutrophils Relative % 67 %   Neutro Abs 6.6 1.7 - 7.7 K/uL   Lymphocytes Relative 26 %   Lymphs Abs 2.6 0.7 - 4.0 K/uL   Monocytes Relative 6 %   Monocytes Absolute 0.6  0.1 - 1.0 K/uL   Eosinophils Relative 1 %   Eosinophils Absolute 0.1 0.0 - 0.5 K/uL   Basophils Relative 0 %   Basophils Absolute 0.0 0.0 - 0.1 K/uL   Immature Granulocytes 0 %   Abs Immature Granulocytes 0.04 0.00 - 0.07 K/uL  Troponin I (High Sensitivity)     Status: None   Collection Time: 03/01/20  3:56 PM  Result Value Ref Range   Troponin I (High Sensitivity) 10 <18 ng/L  Basic metabolic panel     Status: Abnormal   Collection Time: 03/01/20  3:56 PM  Result Value Ref Range   Sodium 136 135 - 145 mmol/L   Potassium 4.3 3.5 - 5.1 mmol/L   Chloride 100 98 - 111 mmol/L   CO2 25 22 - 32 mmol/L   Glucose, Bld 98 70 - 99 mg/dL   BUN 16 8 - 23 mg/dL   Creatinine, Ser 1.13 (H) 0.44 - 1.00 mg/dL   Calcium 9.5 8.9 - 10.3 mg/dL   GFR, Estimated 53 (L) >60 mL/min   Anion gap 11 5 - 15   ____________________________________________  EKG My review and personal interpretation at Time: 15:50   Indication: ams  Rate: 53  Rhythm: sinus Axis: normal Other: nonspecific st abn, no stemi ____________________________________________  RADIOLOGY  I personally reviewed all radiographic images ordered to evaluate for the above acute complaints and reviewed radiology reports and findings.  These findings were personally discussed with the patient.  Please see medical record for  radiology report.  ____________________________________________   PROCEDURES  Procedure(s) performed:  Procedures    Critical Care performed: no ____________________________________________   INITIAL IMPRESSION / ASSESSMENT AND PLAN / ED COURSE  Pertinent labs & imaging results that were available during my care of the patient were reviewed by me and considered in my medical decision making (see chart for details).   DDX: cva, tia, hypoglycemia, dehydration, electrolyte abnormality, dissection, sepsis   LAURISA SAHAKIAN is a 69 y.o. who presents to the ED with presentation concerning for CVA.  Patient outside of the window giving symptoms intermittent stuttering since Sunday.  Currently she is without any focal neuro deficits.  Is reporting a mild confusion but she is answering questions appropriately.  CT head shows evidence of acute versus subacute infarct.  She is already on Xarelto.  Denies any chest pain.  Blood work does show mild dehydration mild elevation of creatinine no other significant electrolyte abnormality.  Based on these findings her risk factors age will require hospitalization for further medical management.     The patient was evaluated in Emergency Department today for the symptoms described in the history of present illness. He/she was evaluated in the context of the global COVID-19 pandemic, which necessitated consideration that the patient might be at risk for infection with the SARS-CoV-2 virus that causes COVID-19. Institutional protocols and algorithms that pertain to the evaluation of patients at risk for COVID-19 are in a state of rapid change based on information released by regulatory bodies including the CDC and federal and state organizations. These policies and algorithms were followed during the patient's care in the ED.  As part of my medical decision making, I reviewed the following data within the [place] notes reviewed and  incorporated, Labs reviewed, notes from prior ED visits and [place] Controlled Substance Database   ____________________________________________   FINAL CLINICAL IMPRESSION(S) / ED DIAGNOSES  Final diagnoses:  Cerebrovascular accident (CVA), unspecified mechanism ([place])  NEW MEDICATIONS STARTED DURING THIS VISIT:  New Prescriptions   No medications on file     Note:  This document was prepared using Dragon voice recognition software and may include unintentional dictation errors.    Merlyn Lot, MD 03/01/20 2123

## 2020-03-01 NOTE — Progress Notes (Addendum)
Office Visit    Patient Name: Tina Peters Date of Encounter: 03/01/2020  Primary Care Provider:  Donnie Coffin, MD Primary Cardiologist:  [name], MD  Chief Complaint    69 year old female with a history of paroxysmal atrial fibrillation, PSVT, hypertension, hyperlipidemia, obstructive sleep apnea on CPAP asthma, tobacco abuse, back pain, and diverticulitis, who presents for evaluation of presyncope, headache, and altered mental status.  Past Medical History    Past Medical History:  Diagnosis Date  . Asthma   . Chronic back pain   . Diverticulitis   . Hyperlipidemia   . Hypertension   . Lipoma   . Mild mitral regurgitation    a. 08/2016 Echo: mild MR.  . Obesity   . Obstructive sleep apnea   . PAF (paroxysmal atrial fibrillation) ([place])    a. Dx 05/2016--> Xarelto (CHA2DS2VASc = 3); b. 08/2016 Echo: EF 55-60%, no rwma, mild MR, mod dil LA.  . Paroxysmal SVT (supraventricular tachycardia) ([place])   . Tobacco abuse    Past Surgical History:  Procedure Laterality Date  . ACHILLES TENDON SURGERY    . BACK SURGERY    . KNEE ARTHROSCOPY W/ MENISCAL REPAIR    . KNEE SURGERY    . OVARY SURGERY    . TUMOR REMOVAL     neck    Allergies  No Known Allergies  History of Present Illness    69 year old female with the above past medical history including paroxysmal atrial fibrillation, PSVT, hypertension, hyperlipidemia, asthma, back pain, OSA on CPAP, tobacco abuse, and diverticulitis.  She had an episode of SVT 2016 in the setting of hypokalemia, thought to be due to treatment with hydrochlorothiazide.  She was placed on low-dose long-acting diltiazem with improvement in symptoms and this was subsequently switched to metoprolol due to leg edema.  In January 2018, she was diagnosed with atrial fibrillation with rapid ventricular response, which converted to sinus rhythm with diltiazem.  She was placed on Xarelto in the setting of a CHA2DS2-VASc of 3 and has tolerated  this.  Most recent echocardiogram in April 2018 showed an EF of 55-60% with mild MR, and moderately dilated left atrium.  She was last seen via telemedicine visit in November 2020, at which time she reported episodic tachycardia occurring about twice a month, and lasting 1 hour.  Medical therapy was continued.  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  They were playing cards and while she was sitting, she had sudden onset of profound lightheadedness with graying of vision.  This lasted a few seconds and resolve spontaneously.  She did have recurrent visual disturbance with lightheadedness which was more brief in nature a few minutes later.  She had no further episodes Sunday night.  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  She noted that, that she was trying to get money out of the cash register but could not hold onto it and kept dropping it and also had trouble counting, which was not normal for her.  When it came time to close her register, she was unable to count the money and then after finally closing down the store, she told her boss that she did not know if she remembered how to get to her  home.  Throughout this, she felt somewhat lightheaded but more than anything, describes episode of feeling "foggy."  Yesterday, she continued to feel somewhat off though any fine motor issues resolved.  She has been dealing with a 7/10 headache since the event Monday night.  She denies chest pain, dyspnea, palpitations, PND, orthopnea, syncope, edema, or early satiety.  She does check her pulse at home and sometimes notes rates in the high 40s to low 50s.  She is on Toprol-XL 100 mg daily and held that today after discussion with our nursing team.  Home Medications    Prior to Admission  medications   Medication Sig Start Date End Date Taking? Authorizing Provider  acetaminophen (TYLENOL) 325 MG tablet Take 650 mg by mouth every 4 (four) hours as needed.     [provider]  albuterol (PROVENTIL HFA;VENTOLIN HFA) 108 (90 Base) MCG/ACT inhaler 2 puffs q.i.d. p.r.n. short of breath, wheezing, or cough 07/04/15   [provider]  albuterol (PROVENTIL HFA;VENTOLIN HFA) 108 (90 Base) MCG/ACT inhaler Inhale 2 puffs into the lungs every 6 (six) hours as needed for wheezing or shortness of breath. 07/19/18   Merlyn Lot, MD  ASPIRIN 81 PO Take 81 mg by mouth daily as needed.     [provider]  citalopram (CELEXA) 20 MG tablet Take 20 mg by mouth daily.    [provider]  HYDROcodone-acetaminophen (NORCO) 5-325 MG tablet Take 1 tablet by mouth every 4 (four) hours as needed for moderate pain. Patient not taking: Reported on 03/25/2019 07/19/18   Merlyn Lot, MD  ibuprofen (ADVIL,MOTRIN) 200 MG tablet Take 400 mg by mouth as needed.    [provider]  metoprolol succinate (TOPROL-XL) 100 MG 24 hr tablet Take 1 tablet (100 mg total) by mouth daily. Take with or immediately following a meal. 03/26/19   Wellington Hampshire, MD  naproxen sodium (ALEVE) 220 MG tablet Take 220 mg by mouth daily as needed.    [provider]  rivaroxaban (XARELTO) 20 MG TABS tablet Take 1 tablet (20 mg total) by mouth daily with supper. 08/15/16   Wellington Hampshire, MD  traZODone (DESYREL) 50 MG tablet TAKE 1 TABLET BY MOUTH AT BEDTIME AS NEEDED FOR SLEEP MAY INCREASE TO 2 TABLETS AT BEDTIME IF NOT IMPROVED 03/11/18   [provider]    Review of Systems    Lightheadedness and presyncope on Sunday night followed by acute confusion and difficulty with fine motor skills on Monday night.  She notes frequent urination but denies dysuria.  She has had a 7/10 headache since Monday night.  She denies chest pain, dyspnea, palpitations, PND, orthopnea,  syncope, edema, or early satiety.  All other systems reviewed and are otherwise negative except as noted above.  Physical Exam    VS:  BP 126/74 (BP Location: Left Arm, Patient Position: Sitting, Cuff Size: Large)   Pulse (!) 51   Ht 5\' 5"  (1.651 m)   Wt 220 lb (99.8 kg)   SpO2 97%   BMI 36.61 kg/m  , BMI Body mass index is 36.61 kg/m.  Orthostatic vital signs: Lying 140/84, heart rate 49 Sitting 151/98, 51 (associated dizziness) Standing 153/81, 56 Standing (3 minutes) 172/89, 56  GEN: Well nourished, well developed, in no acute distress. HEENT: normal. Neck: Supple, no JVD, carotid bruits, or masses. Cardiac: RRR, no murmurs, rubs, or gallops. No clubbing, cyanosis, edema.  Radials/PT 2+ and equal bilaterally.  Respiratory:  Respirations regular and unlabored, clear to  auscultation bilaterally. GI: Soft, nontender, nondistended, BS + x 4. MS: no deformity or atrophy. Skin: warm and dry, no rash. Neuro:  Strength and sensation are intact.  CN II through XII grossly intact.   Psych: Normal affect.  Accessory Clinical Findings    ECG personally reviewed by me today -sinus bradycardia, 51- no acute changes.  Lab Results  Component Value Date   WBC 6.7 07/19/2018   HGB 13.9 07/19/2018   HCT 43.3 07/19/2018   MCV 89.5 07/19/2018   PLT 200 07/19/2018   Lab Results  Component Value Date   CREATININE 0.96 07/19/2018   BUN 14 07/19/2018   NA 138 07/19/2018   K 3.9 07/19/2018   CL 107 07/19/2018   CO2 23 07/19/2018    Lab Results  Component Value Date   CHOL 194 06/01/2016   HDL 36 (L) 06/01/2016   LDLCALC 135 (H) 06/01/2016   TRIG 117 06/01/2016   CHOLHDL 5.4 06/01/2016      Assessment & Plan    1.  Altered mental status: Patient had 2 brief episodes of presyncope on Sunday evening.  On Monday evening, while at work, she had acute onset of altered mental status with difficulty using her hand/fine motor abilities as she was trying to count money and put in the  register.  She says she kept dropping on the floor and was not able to count the money properly due to altered mental status.  She does not remember closing down the store that night and says that when she was ready to leave, she was not sure that she remembered how to get home.  Altered mental status has subsequently resolved but she has had a 7/10 headache since then.  Her neuro exam is nonfocal today.  I am concerned about TIA and in the setting of ongoing headache with Xarelto therapy, I feel she requires a head CT today to rule out bleed and/or stroke.  She will be wheeled over to the emergency room for further imaging and neurologic evaluation if necessary.  2.  Headache: Persistent since episode of altered mental status occurring Sunday evening.  Currently rated at 7/10.  She denies any recent trauma to the head.  She did have a fall after tripping over her flip-flops recently but only skinned her knees.  To ED for head CT as outlined above.  3.  Presyncope: Patient had 2 brief episodes of presyncope on Sunday evening, both described as sudden lightheadedness with graying of vision.  She did not lose consciousness.  She has had no recurrence but has noted heart rates in the 40s and 50s.  Heart rate is 51 today in sinus bradycardia without any evidence of high-grade heart block.  She is currently on Toprol-XL 100 mg daily.  She is not orthostatic on examination and in fact is hypertensive both at rest and with standing.  Given baseline bradycardia with concern for possibly prolonged pauses, I will reduce Toprol-XL to 50 mg daily and pending ER work-up, we will plan to mail her a Zio AT (live telemetry) for her to wear for 2 weeks.  Lab work today in the ED.  4.  Paroxysmal atrial fibrillation: In sinus rhythm today.  No recent palpitations.  Reducing beta-blocker in the setting of bradycardia with presyncope.  Continue Xarelto therapy pending CT imaging of the head tonight.  5.  PSVT: Quiescent.   Reducing beta-blocker as above in the setting of bradycardia.  6.  Sinus bradycardia: Heart rates 40s to  50s at home.  51 today despite not taking her Toprol-XL.  We will plan to reduce Toprol to 50 mg daily and arrange for Zio monitor to assess for prolonged pauses or other arrhythmias.  7.  Obstructive sleep apnea: Compliant with CPAP.  8.  Tobacco abuse: Still smoking some.  She notes that she is also surrounded by secondhand smoke.  Complete cessation advised.  9.  Essential hypertension: Blood pressure elevated today.  She has not taken metoprolol today and as above, will reduce.  Defer adjustment of medications given symptoms as outlined above.    10.  Disposition: Unfortunately, in the setting of severe headache since an episode of altered mental status with loss of fine motor skills of her hands on Monday night, I have asked staff to take her over to the emergency department for further neuro evaluation and CT imaging of the brain, especially in light of ongoing Xarelto therapy.  Following ED eval, we will arrange for 14-day Zio monitor to assess for more significant symptomatic bradycardia or other arrhythmias in the setting of presyncope.  Follow-up in clinic in approximately 6 weeks or sooner if necessary.   Murray Hodgkins, NP 03/01/2020, 2:53 PM

## 2020-03-02 ENCOUNTER — Observation Stay (HOSPITAL_BASED_OUTPATIENT_CLINIC_OR_DEPARTMENT_OTHER)
Admit: 2020-03-02 | Discharge: 2020-03-02 | Disposition: A | Discharge status: Home / Self Care | Payer: Medicare HMO | Attending: Internal Medicine | Admitting: Internal Medicine

## 2020-03-02 ENCOUNTER — Observation Stay: Admit: 2020-03-02 | Payer: Medicare HMO

## 2020-03-02 ENCOUNTER — Observation Stay: Payer: Medicare HMO

## 2020-03-02 ENCOUNTER — Other Ambulatory Visit: Payer: Self-pay

## 2020-03-02 DIAGNOSIS — I480 Paroxysmal atrial fibrillation: Secondary | ICD-10-CM | POA: Diagnosis not present

## 2020-03-02 DIAGNOSIS — I639 Cerebral infarction, unspecified: Secondary | ICD-10-CM | POA: Diagnosis not present

## 2020-03-02 DIAGNOSIS — I6389 Other cerebral infarction: Secondary | ICD-10-CM | POA: Diagnosis not present

## 2020-03-02 DIAGNOSIS — I340 Nonrheumatic mitral (valve) insufficiency: Secondary | ICD-10-CM | POA: Diagnosis not present

## 2020-03-02 DIAGNOSIS — I63511 Cerebral infarction due to unspecified occlusion or stenosis of right middle cerebral artery: Secondary | ICD-10-CM

## 2020-03-02 LAB — ECHOCARDIOGRAM COMPLETE
AR max vel: 1.79 cm2
AV Area VTI: 1.8 cm2
AV Area mean vel: 1.95 cm2
AV Mean grad: 5 mmHg
AV Peak grad: 12 mmHg
Ao pk vel: 1.73 m/s
Area-P 1/2: 1.79 cm2
Height: 65 in
S' Lateral: 3 cm
Weight: 3492.09 oz

## 2020-03-02 LAB — TROPONIN I (HIGH SENSITIVITY)
Troponin I (High Sensitivity): 27 ng/L — ABNORMAL HIGH (ref <18)
Troponin I (High Sensitivity): 23 ng/L — ABNORMAL HIGH (ref <18)

## 2020-03-02 LAB — LIPID PANEL
Cholesterol: 210 mg/dL — ABNORMAL HIGH (ref 0–200)
HDL: 41 mg/dL (ref >40)
LDL Cholesterol: 151 mg/dL — ABNORMAL HIGH (ref 0–99)
Total CHOL/HDL Ratio: 5.1 RATIO
Triglycerides: 91 mg/dL (ref <150)
VLDL: 18 mg/dL (ref 0–40)

## 2020-03-02 LAB — HIV ANTIBODY (ROUTINE TESTING W REFLEX): HIV Screen 4th Generation wRfx: NONREACTIVE

## 2020-03-02 LAB — TSH: TSH: 2.563 u[IU]/mL (ref 0.350–4.500)

## 2020-03-02 LAB — HEMOGLOBIN A1C
Hgb A1c MFr Bld: 5.7 % — ABNORMAL HIGH (ref 4.8–5.6)
Mean Plasma Glucose: 117 mg/dL

## 2020-03-02 MED ORDER — ONDANSETRON HCL 4 MG/2ML IJ SOLN
4.0000 mg | Freq: Four times a day (QID) | INTRAMUSCULAR | Status: DC
  Administered 2020-03-02 (×2): 4 mg via INTRAVENOUS
  Filled 2020-03-02 (×2): qty 2

## 2020-03-02 MED ORDER — ASPIRIN 81 MG PO TBEC: 81.0000 mg | DELAYED_RELEASE_TABLET | Freq: Every day | ORAL | 0 refills | Status: DC | PRN

## 2020-03-02 MED ORDER — CEPHALEXIN 250 MG PO CAPS: 250.0000 mg | ORAL_CAPSULE | Freq: Two times a day (BID) | ORAL | 0 refills | Status: AC

## 2020-03-02 MED ORDER — PERFLUTREN LIPID MICROSPHERE
1.0000 mL | INTRAVENOUS | Status: AC | PRN
  Administered 2020-03-02: 2 mL via INTRAVENOUS
  Filled 2020-03-02: qty 10

## 2020-03-02 MED ORDER — ATORVASTATIN CALCIUM 80 MG PO TABS: 80.0000 mg | ORAL_TABLET | Freq: Every day | ORAL | 0 refills | Status: DC

## 2020-03-02 MED ORDER — ASPIRIN 81 MG PO TBEC: 81.0000 mg | DELAYED_RELEASE_TABLET | Freq: Every day | ORAL | 0 refills | Status: AC | PRN

## 2020-03-02 MED ORDER — ATORVASTATIN CALCIUM 40 MG PO TABS: 40.0000 mg | ORAL_TABLET | Freq: Every day | ORAL | 0 refills | Status: DC

## 2020-03-02 MED ORDER — RIVAROXABAN 20 MG PO TABS
20.0000 mg | ORAL_TABLET | Freq: Every day | ORAL | 0 refills | Status: DC
Start: 2020-03-05 — End: 2020-04-19

## 2020-03-02 MED ORDER — RIVAROXABAN 20 MG PO TABS
20.0000 mg | ORAL_TABLET | Freq: Every day | ORAL | 0 refills | Status: DC
Start: 2020-03-06 — End: 2020-03-02

## 2020-03-02 NOTE — Discharge Summary (Signed)
Tina Peters at [facility] NAME: Tina Peters    MR#:  [id number]  DATE OF BIRTH:  Apr 14, 1951  DATE OF ADMISSION:  03/01/2020   ADMITTING PHYSICIAN: Athena Masse, MD  DATE OF DISCHARGE: 03/02/2020  4:43 PM  PRIMARY CARE PHYSICIAN: Donnie Coffin, MD   ADMISSION DIAGNOSIS:  Acute CVA (cerebrovascular accident) ([place]) [I63.9] DISCHARGE DIAGNOSIS:  Principal Problem:   Cerebrovascular accident (CVA) ([place]) Active Problems:   Paroxysmal SVT (supraventricular tachycardia) ([facility])   Hypertension   AF (paroxysmal atrial fibrillation) ([facility])   OSA (obstructive sleep apnea)   Nicotine dependence   Chronic anticoagulation   UTI (urinary tract infection)  SECONDARY DIAGNOSIS:   Past Medical History:  Diagnosis Date  . Asthma   . Chronic back pain   . Diverticulitis   . Hyperlipidemia   . Hypertension   . Lipoma   . Mild mitral regurgitation    a. 08/2016 Echo: mild MR.  . Obesity   . Obstructive sleep apnea   . PAF (paroxysmal atrial fibrillation) ([place])    a. Dx 05/2016--> Xarelto (CHA2DS2VASc = 3); b. 08/2016 Echo: EF 55-60%, no rwma, mild MR, mod dil LA.  . Paroxysmal SVT (supraventricular tachycardia) ([place])   . Tobacco abuse    HOSPITAL COURSE:  69 year old female with known history of paroxysmal A. fib on Xarelto, OSA, smoking, hypertension, hyperlipidemia, asthma was admitted for intermittent lightheadedness and presyncope.  She also had episodes of confusion.  Work-up in the ED revealed acute stroke.  Acute CVA MRI of the brain showing right MCA multifocal infarct in the insula and right middle frontal gyrus TTE with no thrombus, EF of 60 to 65% MRA and carotid Dopplers showing no LVO EEG showing no abnormality concerning for potential seizure After evaluation by neurology she is recommended to continue baby aspirin only till 04 March 2020 and switch to Xarelto only starting 31 October. Continue high intensity statin Outpatient neurology  and cardiology follow-up   DISCHARGE CONDITIONS:  Stable CONSULTS OBTAINED:   DRUG ALLERGIES:  No Known Allergies DISCHARGE MEDICATIONS:   Allergies as of 03/02/2020   No Known Allergies     Medication List    STOP taking these medications   HYDROcodone-acetaminophen 5-325 MG tablet Commonly known as: Norco   ibuprofen 200 MG tablet Commonly known as: ADVIL     TAKE these medications   acetaminophen 325 MG tablet Commonly known as: TYLENOL Take 650 mg by mouth every 4 (four) hours as needed.   albuterol 108 (90 Base) MCG/ACT inhaler Commonly known as: VENTOLIN HFA 2 puffs q.i.d. p.r.n. short of breath, wheezing, or cough   aspirin 81 MG EC tablet Commonly known as: Aspirin 81 Take 1 tablet (81 mg total) by mouth daily as needed for up to 2 days. Take asa till Oct 30th and start Xarelto starting Oct 31st What changed:  medication strength additional instructions   atorvastatin 80 MG tablet Commonly known as: LIPITOR Take 1 tablet (80 mg total) by mouth daily. Start taking on: March 03, 2020   cephALEXin 250 MG capsule Commonly known as: KEFLEX Take 1 capsule (250 mg total) by mouth 2 (two) times daily for 3 days.   citalopram 20 MG tablet Commonly known as: CELEXA Take 20 mg by mouth daily.   metoprolol succinate 100 MG 24 hr tablet Commonly known as: TOPROL-XL Take 0.5 tablets (50 mg total) by mouth daily. Take with or immediately following a meal. What  changed: how much to take   naproxen sodium 220 MG tablet Commonly known as: ALEVE Take 220 mg by mouth daily as needed.   rivaroxaban 20 MG Tabs tablet Commonly known as: XARELTO Take 1 tablet (20 mg total) by mouth daily with supper. Take asa 81 mg po daily till Oct 30th, start Xarelto on Oct 31st Start taking on: March 05, 2020 What changed:  additional instructions These instructions start on March 05, 2020. If you are unsure what to do until then, ask your doctor or other care provider.    traZODone 50 MG tablet Commonly known as: DESYREL TAKE 1 TABLET BY MOUTH AT BEDTIME AS NEEDED FOR SLEEP MAY INCREASE TO 2 TABLETS AT BEDTIME IF NOT IMPROVED      DISCHARGE INSTRUCTIONS:   DIET:  Low fat, Low cholesterol diet DISCHARGE CONDITION:  Stable ACTIVITY:  Activity as tolerated OXYGEN:  Home Oxygen: No.  Oxygen Delivery: room air DISCHARGE LOCATION:  home   If you experience worsening of your admission symptoms, develop shortness of breath, life threatening emergency, suicidal or homicidal thoughts you must seek medical attention immediately by calling 911 or calling your MD immediately  if symptoms less severe.  You Must read complete instructions/literature along with all the possible adverse reactions/side effects for all the Medicines you take and that have been prescribed to you. Take any new Medicines after you have completely understood and accpet all the possible adverse reactions/side effects.   Please note  You were cared for by a hospitalist during your hospital stay. If you have any questions about your discharge medications or the care you received while you were in the hospital after you are discharged, you can call the unit and asked to speak with the hospitalist on call if the hospitalist that took care of you is not available. Once you are discharged, your primary care physician will handle any further medical issues. Please note that NO REFILLS for any discharge medications will be authorized once you are discharged, as it is imperative that you return to your primary care physician (or establish a relationship with a primary care physician if you do not have one) for your aftercare needs so that they can reassess your need for medications and monitor your lab values.    On the day of Discharge:  VITAL SIGNS:  Blood pressure (!) 175/99, pulse 68, temperature 98.2 F (36.8 C), temperature source Oral, resp. rate 18, height 5\' 5"  (1.651 m), weight 99 kg,  SpO2 98 %. PHYSICAL EXAMINATION:  GENERAL:  69 y.o.-year-old patient lying in the bed with no acute distress.  EYES: Pupils equal, round, reactive to light and accommodation. No scleral icterus. Extraocular muscles intact.  HEENT: Head atraumatic, normocephalic. Oropharynx and nasopharynx clear.  NECK:  Supple, no jugular venous distention. No thyroid enlargement, no tenderness.  LUNGS: Normal breath sounds bilaterally, no wheezing, rales,rhonchi or crepitation. No use of accessory muscles of respiration.  CARDIOVASCULAR: S1, S2 normal. No murmurs, rubs, or gallops.  ABDOMEN: Soft, non-tender, non-distended. Bowel sounds present. No organomegaly or mass.  EXTREMITIES: No pedal edema, cyanosis, or clubbing.  NEUROLOGIC: Cranial nerves II through XII are intact. Muscle strength 5/5 in all extremities. Sensation intact. Gait not checked.  PSYCHIATRIC: The patient is alert and oriented x 3.  SKIN: No obvious rash, lesion, or ulcer.  DATA REVIEW:   CBC Recent Labs  Lab 03/01/20 1556  WBC 9.9  HGB 14.0  HCT 43.1  PLT 238    Chemistries  Recent  Labs  Lab 03/01/20 1556  NA 136  K 4.3  CL 100  CO2 25  GLUCOSE 98  BUN 16  CREATININE 1.13*  CALCIUM 9.5     Outpatient follow-up  Follow-up Information    Aycock, Ngwe A, MD. Schedule an appointment as soon as possible for a visit in 1 week(s).   Specialty: Family Medicine Contact information: [place] [address] [phone]        Wellington Hampshire, MD. Schedule an appointment as soon as possible for a visit in 2 week(s).   Specialty: Cardiology Contact information: [place] [address] [phone]        Vladimir Crofts, MD. Schedule an appointment as soon as possible for a visit in 1 month(s).   Specialty: Neurology Contact information: [facility] [place] [address] [phone]                 Management plans discussed with the patient, family and they are in agreement.  CODE STATUS: Full Code   TOTAL TIME TAKING CARE OF THIS PATIENT: 45 minutes.    Max Sane M.D on 03/02/2020 at 5:13 PM  [facility]   CC: Primary care physician; Donnie Coffin, MD   Note: This dictation was prepared with Dragon dictation along with smaller phrase technology. Any transcriptional errors that result from this process are unintentional.

## 2020-03-02 NOTE — Progress Notes (Unsigned)
EEG completed, results pending. 

## 2020-03-02 NOTE — ED Notes (Signed)
ED Provider at bedside. 

## 2020-03-02 NOTE — ED Notes (Signed)
Date and time results received: 03/02/20 0545 (use smartphrase ".now" to insert current time)  Test: Troponin Critical Value: 27  Name of Provider Notified: Dr. Damita Dunnings  Orders Received? Or Actions Taken?: Awaiting provider response

## 2020-03-02 NOTE — Consult Note (Signed)
NEUROLOGY CONSULTATION NOTE   Date of service: March 02, 2020 Patient Name: Tina Peters MRN:  [id number] DOB:  [birth year] Reason for consult: "stroke"  History of Present Illness  Tina Peters is a 69 y.o. female with PMH significant for pAfibb on Xarelto(misses one to two doses every week), OSA, smoking, HTN, HLD, Asthma who presents with 3 day hx of intermittent lightheadedness and presyncope. She reports having brief episodes of black vision and almost passing out for the last 2 years. She has been getting them about once every 2-3 months. These episodes typically last few seconds to upto a minute but not more than that. She had 3 episodes however in the last few days. She was playing cards with friends and out of nowhere, she had 2 episodes. She had another one at work while 3 of her coworkers were asking her to do something for them. 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  She denies any warning symptoms, no aura, no nausea, endorses lightheadedness during the episode and vision tunnels. No post ictal confusion, she is back to her baseline right away. No loss of bladder or bowel. Never had any seizures, no prior hx of Cns surgery, no CNS infections, no prior ICH, no prior strokes that she is aware of. She does not smoke, social drinking.  Endorses symptoms of UTI over the last few days with foul smelling urine, frequency. No fever, no chills, endorses some nausea.  She had workup with MRI brain with multifocal infarcts in the R MCA territory predominantly in the posterior insula and R middle frontal gyrus. She had Carotid duplex and MRA Head with no LVO, no significant carotid stenosis.  ROS   Constitutional Denies weight loss, fever and chills.  HEENT Denies changes in vision and hearing.  Respiratory Denies SOB and  cough.  CV Denies palpitations and CP  GI Denies abdominal pain, nausea, vomiting and diarrhea.  GU Endorses dysuria and urinary frequency.  MSK Denies myalgia and joint pain.  Skin Denies rash and pruritus.  Neurological Denies headache and syncope.  Psychiatric Denies recent changes in mood. Denies anxiety and depression.   Past History   Past Medical History:  Diagnosis Date  . Asthma   . Chronic back pain   . Diverticulitis   . Hyperlipidemia   . Hypertension   . Lipoma   . Mild mitral regurgitation    a. 08/2016 Echo: mild MR.  . Obesity   . Obstructive sleep apnea   . PAF (paroxysmal atrial fibrillation) ([place])    a. Dx 05/2016--> Xarelto (CHA2DS2VASc = 3); b. 08/2016 Echo: EF 55-60%, no rwma, mild MR, mod dil LA.  . Paroxysmal SVT (supraventricular tachycardia) ([place])   . Tobacco abuse    Past Surgical History:  Procedure Laterality Date  . ACHILLES TENDON SURGERY    . BACK SURGERY    . KNEE ARTHROSCOPY W/ MENISCAL REPAIR    . KNEE SURGERY    . OVARY SURGERY    . TUMOR REMOVAL     neck   Family History  Problem Relation Age of Onset  . Diabetes Mother    Social History   Socioeconomic History  . Marital status: Divorced    Spouse name: Not on file  . Number of children: Not on file  . Years of  education: Not on file  . Highest education level: Not on file  Occupational History  . Occupation: Environmental consultant   Tobacco Use  . Smoking status: Current Some Day Smoker    Packs/day: 0.25    Years: 40.00    Pack years: 10.00    Types: Cigarettes  . Smokeless tobacco: Never Used  . Tobacco comment: Used to smoke heavily, cut back in 2015  Vaping Use  . Vaping Use: Never used  Substance and Sexual Activity  . Alcohol use: Yes    Alcohol/week: 0.0 standard drinks    Comment: socially, 1-2 drinks/week  . Drug use: No  . Sexual activity: Not on file  Other Topics Concern  . Not on file  Social History Narrative   Lives alone.   Social Determinants of  Health   Financial Resource Strain:   . Difficulty of Paying Living Expenses: Not on file  Food Insecurity:   . Worried About Charity fundraiser in the Last Year: Not on file  . Ran Out of Food in the Last Year: Not on file  Transportation Needs:   . Lack of Transportation (Medical): Not on file  . Lack of Transportation (Non-Medical): Not on file  Physical Activity:   . Days of Exercise per Week: Not on file  . Minutes of Exercise per Session: Not on file  Stress:   . Feeling of Stress : Not on file  Social Connections:   . Frequency of Communication with Friends and Family: Not on file  . Frequency of Social Gatherings with Friends and Family: Not on file  . Attends Religious Services: Not on file  . Active Member of Clubs or Organizations: Not on file  . Attends Archivist Meetings: Not on file  . Marital Status: Not on file   No Known Allergies  Medications  (Not in a hospital admission)    Vitals   Vitals:   03/02/20 0530 03/02/20 0600 03/02/20 0630 03/02/20 0738  BP: (!) 151/60 (!) 160/79 (!) 177/87 (!) 157/60  Pulse: (!) 58 (!) 55 (!) 57 (!) 58  Resp: 19 (!) 23 (!) 23 18  Temp:    98.2 F (36.8 C)  TempSrc:    Oral  SpO2: 98% 96% 98% 98%  Weight:      Height:         Body mass index is 36.32 kg/m.  Physical Exam   General: Laying comfortably in bed; in no acute distress. HENT: Normal oropharynx and mucosa. Normal external appearance of ears and nose. Neck: Supple, no pain or tenderness CV: No JVD. No peripheral edema. Pulmonary: Symmetric Chest rise. Normal respiratory effort. Abdomen: Soft to touch, non-tender. Ext: No cyanosis, edema, or deformity Skin: No rash. Normal palpation of skin.  Musculoskeletal: Normal digits and nails by inspection. No clubbing.  Neurologic Examination  Mental status/Cognition: Alert, oriented to self, place, month and year, good attention. Speech/language: Fluent, comprehension intact, object naming intact,  repetition intact. Cranial nerves:   CN II Pupils equal and reactive to light, no VF deficits   CN III,IV,VI EOM intact, no gaze preference or deviation, no nystagmus   CN V normal sensation in V1, V2, and V3 segments bilaterally   CN VII no asymmetry, no nasolabial fold flattening   CN VIII normal hearing to speech   CN IX & X normal palatal elevation, no uvular deviation   CN XI 5/5 head turn and 5/5 shoulder shrug bilaterally   CN XII midline tongue  protrusion   Motor:  Muscle bulk: normal, tone normal, pronator drift none tremor none Mvmt Root Nerve  Muscle Right Left Comments  SA C5/6 Ax Deltoid 5 5   EF C5/6 Mc Biceps 5 5   EE C6/7/8 Rad Triceps 4+ 5   WF C6/7 Med FCR 5 5   WE C7/8 PIN ECU 5 5   F Ab C8/T1 U ADM/FDI 5 5   HF L1/2/3 Fem Illopsoas 5 5   KE L2/3/4 Fem Quad 5 5   DF L4/5 D Peron Tib Ant 5 5   PF S1/2 Tibial Grc/Sol 5 5    Reflexes:  Right Left Comments  Pectoralis      Biceps (C5/6) 2 2   Brachioradialis (C5/6) 2 2    Triceps (C6/7) 2 2    Patellar (L3/4) 2+ 2+    Achilles (S1) 2 2    Hoffman      Plantar mute mute   Jaw jerk    Sensation:  Light touch Intact throughout   Pin prick    Temperature    Vibration   Proprioception    Coordination/Complex Motor:  - Finger to Nose intact BL - Heel to shin intact BL - Rapid alternating movement are normal - Gait: did not assess.  Labs   CBC:  Recent Labs  Lab 03/01/20 1556  WBC 9.9  NEUTROABS 6.6  HGB 14.0  HCT 43.1  MCV 89.8  PLT 409    Basic Metabolic Panel:  Lab Results  Component Value Date   NA 136 03/01/2020   K 4.3 03/01/2020   CO2 25 03/01/2020   GLUCOSE 98 03/01/2020   BUN 16 03/01/2020   CREATININE 1.13 (H) 03/01/2020   CALCIUM 9.5 03/01/2020   GFRNONAA 53 (L) 03/01/2020   GFRAA >60 07/19/2018   Lipid Panel:  Lab Results  Component Value Date   LDLCALC 151 (H) 03/02/2020   HgbA1c: No results found for: HGBA1C Urine Drug Screen: No results found for: LABOPIA,  COCAINSCRNUR, LABBENZ, AMPHETMU, THCU, LABBARB  Alcohol Level No results found for: [facility]  MRI Brain without contrast: 1. Multifocal acute ischemia in the right MCA territory, predominantly involving the posterior insula and the right middle frontal gyrus. No hemorrhage or mass effect. 2. Normal intracranial MRA.  MRA Head without contrast: Absent left ACA A1 segment, normal variant. No LVO  Carotid Doppler BL: 1. Right carotid artery system: Patent without significant atherosclerotic plaque formation. 2. Left carotid artery system: Patent without significant atherosclerotic plaque formation. 3.  Vertebral artery system: Patent with antegrade flow bilaterally.  Impression   Tina Peters is a 69 y.o. female with PMH significant for pAfibb on Xarelto(misses one to two doses every week), OSA, smoking, HTN, HLD, Asthma who presents with 3 day hx of intermittent lightheadedness and presyncope. Found to have R MCA multifocal infarcts in the insula and R middle frontal gyrus.   On further questioning, she has been having these events for the last 2 years. I doubt that the strokes on the MRI that are less than a couple days old would explain these long standing episodes. Her neurologic examination is notable for some ?R arm elbow extension weakness but otherwise no focal deficit. Again this would not be explained by the strokes.  We obtained EEG which does not show any abnormality concerning for potential seizures. I did speak to her that it may not be a good idea to drive while she is still having these episodes, she could passout  while driving and wreck her car, causing harm to herself and others. Also discussed that she should take it easy for the next few days. Another concern that I mentioned is with her having these episodes while on Xarelto, she could fall and hit her head but she has not had any falls during these episodes, she reports she has not passed out completely, just has episode of  almost passing out.  Of note, she has been bradycardic throughout this admission. She did inform me that her cardiologist has ordered monitoring for her heart rhythym for her home and it should be mailed to her soon.  Recommendations  - MRI Brain without contrast with R MCa multifocal infarcts - No LVO on MRA and Carotid duplex. - TTE with no thrombus, EF of 60-65% - LDL of 151, recommend increasing Atorvastatin to 80mg  daily - HbA1c is pending - Aspirin 81mg  daily with last dose on 10/30, recommend switching back to home dose on Xarelto starting 10/31. - SBP goal - gradual normotension - Recommend Telemetry monitoring for arrythmia - Recommend bedside swallow screen prior to PO intake. - Stroke education booklet - Recommend PT/OT/SLP consult - I ordered orthostatic vitals x 1 only. - Follow up with neurology outpatient for stroke follow up and for driving clearance. ______________________________________________________________________   Thank you for the opportunity to take part in the care of this patient. If you have any further questions, please contact the neurology consultation attending.  Signed,  [place] Pager Number [phone]

## 2020-03-02 NOTE — Procedures (Signed)
Patient Name: Tina Peters  MRN: [id number]  Epilepsy Attending: Lora Havens  Referring Physician/Provider: Dr. Judd Gaudier Date: 03/02/2020 Duration: 24.41 mins  Patient history: 69 year old female who presented with 3-day history of intermittent episodes of confusion, lightheadedness.  EEG to evaluate for seizure.  Level of alertness: Awake  AEDs during EEG study: None  Technical aspects: This EEG study was done with scalp electrodes positioned according to the 10-20 International system of electrode placement. Electrical activity was acquired at a sampling rate of 500Hz  and reviewed with a high frequency filter of 70Hz  and a low frequency filter of 1Hz . EEG data were recorded continuously and digitally stored.   Description: The posterior dominant rhythm consists of 10 Hz activity of moderate voltage (25-35 uV) seen predominantly in posterior head regions, symmetric and reactive to eye opening and eye closing. Physiologic photic driving was not seen during photic stimulation.  Hyperventilation was not performed.     IMPRESSION: This study is within normal limits. No seizures or epileptiform discharges were seen throughout the recording.  [name] [name]

## 2020-03-02 NOTE — Progress Notes (Signed)
*  PRELIMINARY RESULTS* Echocardiogram 2D Echocardiogram has been performed.  Tina Peters 03/02/2020, 11:28 AM

## 2020-03-02 NOTE — Evaluation (Signed)
Physical Therapy Evaluation Patient Details Name: Tina Peters MRN: [id number] DOB: 09/02/1950 Today's Date: 03/02/2020   History of Present Illness  Pt is a 69 y.o. female presenting to hospital 10/27 after being referred to ED by cardiologist (d/t concern for CVA).  Pt noted with confusion, episodes of feeling like passing out, increased forgetfulness, and difficulty completing normal tasks.  Pt admitted with acute CVA and UTI.  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  PMH includes asthma, chronic back pain, htn, lipoma, mild mitral regurgitation, OSA, paroxysmal SVT, achilles tendon surgery, back surgery, knee surgery, tumor removal neck.  Clinical Impression  Prior to hospital admission, pt was independent; lives alone on main level of home.  Currently pt is modified independent with bed mobility; independent with transfers; and independent with ambulation in hospital room.  No difficulties noted with pt toileting and washing hands at sink.  Per discussion with pt, pt appears to be at baseline level of functional mobility.  No cognitive concerns noted during sessions activities.  No acute PT needs identified; will sign off.  Please re-consult PT if pt's status changes and acute PT needs are identified.    Follow Up Recommendations No PT follow up    Equipment Recommendations  None recommended by PT    Recommendations for Other Services       Precautions / Restrictions Precautions Precautions: None Restrictions Weight Bearing Restrictions: No      Mobility  Bed Mobility Overal bed mobility: Modified Independent             General bed mobility comments: No difficulties noted semi-supine to/from sitting edge of bed    Transfers Overall transfer level: Independent Equipment used: None             General transfer comment: steady safe transfers  from ED stretcher bed and  toilet  Ambulation/Gait Ambulation/Gait assistance: Independent Gait Distance (Feet): 120 Feet (in room) Assistive device: None       General Gait Details: mildly increased B lateral sway but overall steady and safe  Stairs            Wheelchair Mobility    Modified Rankin (Stroke Patients Only)       Balance Overall balance assessment: Modified Independent Sitting-balance support: No upper extremity supported;Feet supported Sitting balance-Leahy Scale: Normal Sitting balance - Comments: steady sitting reaching outside BOS   Standing balance support: No upper extremity supported;During functional activity Standing balance-Leahy Scale: Normal Standing balance comment: steady with standing dynamic activity and walking                             Pertinent Vitals/Pain Pain Assessment: 0-10 Pain Score: 4  Pain Location: headache Pain Descriptors / Indicators: Headache Pain Intervention(s): Limited activity within patient's tolerance;Monitored during session;Repositioned  Vitals (HR and O2 on room air) stable and WFL throughout treatment session.    Home Living Family/patient expects to be discharged to:: Private residence Living Arrangements: Alone Available Help at Discharge: Family Type of Home: House Home Access: Stairs to enter Entrance Stairs-Rails: Right;Left;Can reach both Entrance Stairs-Number of Steps: [place]: Two level;Able to live on main level with bedroom/bathroom Home Equipment: Kasandra Knudsen - single point      Prior Function Level of Independence: Independent         Comments: Pt reports 1 fall around Sept 1st (tripped d/t flip flop) hurting  L knee and L shoulder     Hand Dominance        Extremity/Trunk Assessment   Upper Extremity Assessment Upper Extremity Assessment: Overall WFL for tasks assessed    Lower Extremity Assessment Lower Extremity Assessment: Overall WFL for tasks assessed (Intact B LE proprioception,  heel to shin coordination, tone, and light touch)    Cervical / Trunk Assessment Cervical / Trunk Assessment: Normal  Communication   Communication: No difficulties  Cognition Arousal/Alertness: Awake/alert Behavior During Therapy: WFL for tasks assessed/performed Overall Cognitive Status: Within Functional Limits for tasks assessed                                 General Comments: Oriented to person, place, time, and situation.  No cognitive concerns noted during session.      General Comments   Nursing cleared pt for participation in physical therapy.  Pt agreeable to PT session.  Pt's daughter present during session.    Exercises     Assessment/Plan    PT Assessment Patent does not need any further PT services  PT Problem List         PT Treatment Interventions      PT Goals (Current goals can be found in the Care Plan section)  Acute Rehab PT Goals Patient Stated Goal: to go home PT Goal Formulation: With patient Time For Goal Achievement: 03/16/20 Potential to Achieve Goals: Good    Frequency     Barriers to discharge        Co-evaluation               AM-PAC PT "6 Clicks" Mobility  Outcome Measure Help needed turning from your back to your side while in a flat bed without using bedrails?: None Help needed moving from lying on your back to sitting on the side of a flat bed without using bedrails?: None Help needed moving to and from a bed to a chair (including a wheelchair)?: None Help needed standing up from a chair using your arms (e.g., wheelchair or bedside chair)?: None Help needed to walk in hospital room?: None Help needed climbing 3-5 steps with a railing? : None 6 Click Score: 24    End of Session Equipment Utilized During Treatment: Gait belt Activity Tolerance: Patient tolerated treatment well Patient left: in bed;with call bell/phone within reach;with family/visitor present (EEG staff present) Nurse Communication:  Mobility status;Precautions (pt's IV beeping) PT Visit Diagnosis: Muscle weakness (generalized) (M62.81)    Time: 0973-5329 PT Time Calculation (min) (ACUTE ONLY): 18 min   Charges:   PT Evaluation $PT Eval Low Complexity: 1 Low         Ellaree Gear, PT 03/02/20, 12:14 PM

## 2020-03-02 NOTE — Evaluation (Signed)
Occupational Therapy Evaluation Patient Details Name: Tina Peters MRN: [id number] DOB: 1950-12-18 Today's Date: 03/02/2020    History of Present Illness Pt is a 69 y.o. female presenting to hospital 10/27 after being referred to ED by cardiologist (d/t concern for CVA).  Pt noted with confusion, episodes of feeling like passing out, increased forgetfulness, and difficulty completing normal tasks.  Pt admitted with acute CVA and UTI.  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  PMH includes asthma, chronic back pain, htn, lipoma, mild mitral regurgitation, OSA, paroxysmal SVT, achilles tendon surgery, back surgery, knee surgery, tumor removal neck.   Clinical Impression   Tina Peters, seen in the ED this date, reports that she is feeling "back to normal" and is eager to go home. Prior to hospitalization, Tina Peters has been IND in ADL/IADL. She drives, is employed full time, does her own shopping, cooking, medication management, etc. During today's OT evaluation, Tina Peters shows no impairments with balance, vision, or cognition. Strength WFL and grossly symmetrical bilaterally. No impairments in functional mobility noted; hence, no OT services necessary at this point, either in hospital or post DC.     Follow Up Recommendations  No OT follow up    Equipment Recommendations  None recommended by OT    Recommendations for Other Services       Precautions / Restrictions Precautions Precautions: None Restrictions Weight Bearing Restrictions: No      Mobility Bed Mobility Overal bed mobility: Independent             General bed mobility comments: No difficulties noted semi-supine to/from sitting edge of bed    Transfers Overall transfer level: Independent Equipment used: None             General transfer comment: steady safe transfers  from ED stretcher bed and toilet    Balance  Overall balance assessment: Independent Sitting-balance support: Feet unsupported;No upper extremity supported Sitting balance-Leahy Scale: Normal Sitting balance - Comments: steady sitting reaching outside BOS   Standing balance support: No upper extremity supported;During functional activity Standing balance-Leahy Scale: Normal Standing balance comment: Steady standing balance                           ADL either performed or assessed with clinical judgement   ADL Overall ADL's : Independent                                             Vision Patient Visual Report: No change from baseline       Perception     Praxis      Pertinent Vitals/Pain Pain Assessment: No/denies pain Pain Score: 4  Pain Location: reports having some nausea at present, but no pain Pain Descriptors / Indicators: Headache Pain Intervention(s): Limited activity within patient's tolerance;Monitored during session;Repositioned     Hand Dominance Right   Extremity/Trunk Assessment Upper Extremity Assessment Upper Extremity Assessment: Overall WFL for tasks assessed   Lower Extremity Assessment Lower Extremity Assessment: Overall WFL for tasks assessed   Cervical / Trunk Assessment Cervical / Trunk Assessment: Normal   Communication Communication Communication: No difficulties   Cognition Arousal/Alertness: Awake/alert Behavior During Therapy: WFL for tasks assessed/performed Overall Cognitive Status: Within Functional Limits for tasks assessed  General Comments: Oriented to person, place, time, and situation.  No cognitive concerns noted during session.   General Comments       Exercises Other Exercises Other Exercises: Educ re role of OT, POC, stroke warning signs   Shoulder Instructions      Home Living Family/patient expects to be discharged to:: Private residence Living Arrangements: Alone Available Help at  Discharge: Family Type of Home: House Home Access: Stairs to enter [employer] of Steps: 4 Entrance Stairs-Rails: Right;Left;Can reach both Home Layout: Two level;Able to live on main level with bedroom/bathroom     Bathroom Shower/Tub: Occupational psychologist: Handicapped height     Home Equipment: [place] - single point          Prior Functioning/Environment Level of Independence: Independent        Comments: Pt reports 1 fall around Sept 1st (tripped d/t flip flop) hurting L knee and L shoulder        OT Problem List: Decreased activity tolerance;Decreased knowledge of precautions      OT Treatment/Interventions:      OT Goals(Current goals can be found in the care plan section) Acute Rehab OT Goals Patient Stated Goal: to go home OT Goal Formulation: With patient Time For Goal Achievement: 03/16/20 Potential to Achieve Goals: Good  OT Frequency:     Barriers to D/C:            Co-evaluation              AM-PAC OT "6 Clicks" Daily Activity     Outcome Measure Help from another person eating meals?: None Help from another person taking care of personal grooming?: None Help from another person toileting, which includes using toliet, bedpan, or urinal?: None Help from another person bathing (including washing, rinsing, drying)?: None Help from another person to put on and taking off regular upper body clothing?: None Help from another person to put on and taking off regular lower body clothing?: None 6 Click Score: 24   End of Session    Activity Tolerance: Patient tolerated treatment well Patient left: in bed;with call bell/phone within reach;with family/visitor present  OT Visit Diagnosis: Muscle weakness (generalized) (M62.81)                Time: 9357-0177 OT Time Calculation (min): 10 min Charges:  OT General Charges $OT Visit: 1 Visit OT Evaluation $OT Eval Low Complexity: 1 Low OT Treatments $Self Care/Home Management  : 8-22 mins  Josiah Lobo, PhD, [place], OTR/L ascom [phone] 03/02/20, 2:05 PM

## 2020-03-03 ENCOUNTER — Ambulatory Visit: Payer: Self-pay

## 2020-03-03 ENCOUNTER — Telehealth: Payer: Self-pay

## 2020-03-03 DIAGNOSIS — E785 Hyperlipidemia, unspecified: Secondary | ICD-10-CM

## 2020-03-03 MED ORDER — ATORVASTATIN CALCIUM 80 MG PO TABS: 40.0000 mg | ORAL_TABLET | Freq: Every day | ORAL | 2 refills | Status: DC

## 2020-03-03 NOTE — Telephone Encounter (Signed)
Patients daughter called and questioned the dosing on Lipitor her mother was prescribed at discharge from hospital. She also want to know a range they should try to maintain her mothers BP.  She states today it was 105/80 and this afternoon 130/86.  Daughter was advised to contact her mothers PCP and cardiologist for this information. Daughter states that cardiologist was the physician who sent her to ER.  She verbalized understanding of all information and will call both now   Reason for Disposition . [1] Caller requesting NON-URGENT health information AND [2] PCP's office is the best resource  Answer Assessment - Initial Assessment Questions 1. REASON FOR CALL or QUESTION: "What is your reason for calling today?" or "How can I best help you?" or "What question do you have that I can help answer?"     My mother was just released from hospital after a stroke  What should her BP be and why is she on Lipitor 80 mg Her cardiologist states its a large dose  Protocols used: [place]

## 2020-03-03 NOTE — Telephone Encounter (Signed)
Patients daughter calling - Tina Peters - [phone] Needing an appointment in 2 weeks due to hospital visit.  Scheduled her with Tina Bayley NP 03/17/2020- who seen her last.    The then stated that patients BP was 105/80 @ 11:33am and 137/86 @ 2:45pm.  She stated that the ED said they wanted her BP to be on the higher side due to patient having multiple strokes.   Daughter wants to know where BP should be and if patient should be taking BP medication.   Her daughter also stated that the ED started patient on Lipitor. I verified that per discharge medication list it was Lipitor 40 MG by mouth daily. She thinks pharmacy messed up and gave her 80 MG tablets. Patient has not started taking Lipitor yet.   Daughter said to please call her number when calling to discuss - provided the number at the top of this note.

## 2020-03-03 NOTE — Telephone Encounter (Signed)
Tina Peters was seen in clinic by Tina Bayley, NP 03/01/20 and sent to ED for concern for CVA. She was diagnosed with CVA (R MCA multifocal infarct in the insula and R mdidle frontal gyrus). She is on Xarelto for anticoagulation in setting of PAF but did note missing a dose a few times per week.   Her EEG showed no seizure. Echo showed normal LVEF, gr2DD and mild MR. Carotid ultrasound with no hemodynamically significant stenosis.  Lipid panel 03/02/20 with total cholesterol 210, HDL 41, LDL 151. Per neurology, she was recommended for Atorvastatin 80mg  daily. She was also recommended for "gradual normotension".   She was discharged with recommendation to take Aspirin 81mg  10/28-10/30 and then resume Xarelto 20mg  daily on 10/31. Her Toprol was reduced due to bradycardia to 50mg .   Per neurology the recommendation is for "gradual normotension".   Called daughter, Tina Peters, to discuss. 105/80 and 137/86 were her blood pressure readings today. We discussed that her SBP in the hospital was 150s-180s. Discussed home BP goal of 110-140. Encouraged to check BP once per day and bring a long to her upcoming appointment. We discussed that her Toprol was predominantly prescribed to assist with HR control and was decreased due to bradycardia in ED.  She verbalizes understanding of the directions regarding Aspirin and Xarelto.   Shares with me that her mother was not taking her Atorvastatin 40mg  as previously prescribed due to concerns about myalgias. Reassurance provided. As such, will start Atorvastatin 40mg  daily with plan to repeat lipid panel in 6 weeks. Dose can be further up-titrated as needed based on repeat labs. Will schedule labs at upcoming appt 03/17/20.   Her daughter is scheduling appointment with primary care as well as neurology. Her daughter has concerns that her mother is interested in switching her PCP, provided number for [employer]   Loel Dubonnet, NP

## 2020-03-04 ENCOUNTER — Ambulatory Visit (INDEPENDENT_AMBULATORY_CARE_PROVIDER_SITE_OTHER): Payer: Medicare HMO

## 2020-03-04 DIAGNOSIS — R55 Syncope and collapse: Secondary | ICD-10-CM | POA: Diagnosis not present

## 2020-03-05 ENCOUNTER — Telehealth: Payer: Self-pay | Admitting: Cardiovascular Disease

## 2020-03-05 LAB — URINE CULTURE: Culture: >=100000 — AB

## 2020-03-05 NOTE — Telephone Encounter (Signed)
I received a page overnight as the on call provider for cardiology. I was informed from [employer] that her live ziopatch monitor was showing that she was currently having her 1st recorded incident of atrial fibrillation. Her average rate was 109. [employer] representative was able to contact the patient and confirmed that she was asymptomatic at that time.   This will need to be reviewed upon final receipt of her ziopatch data and will require follow up for consideration of management of atrial fibrillation.   [name] Shawnette Augello

## 2020-03-17 ENCOUNTER — Ambulatory Visit: Payer: Medicare HMO | Admitting: Nurse Practitioner

## 2020-04-03 ENCOUNTER — Telehealth: Payer: Self-pay

## 2020-04-03 ENCOUNTER — Other Ambulatory Visit: Payer: Self-pay

## 2020-04-03 DIAGNOSIS — I480 Paroxysmal atrial fibrillation: Secondary | ICD-10-CM

## 2020-04-03 NOTE — Telephone Encounter (Signed)
Outreach made to pt.  Pt scheduled to see Dr. Quentin Ore 04/19/20 at 11:20 to discuss HM results/afib.

## 2020-04-03 NOTE — Telephone Encounter (Signed)
Message sent to Dr. Fletcher Anon Via Secure Chat -  Just uploaded patients Monitor - " Atrial Fibrillation occurred (24% burden), ranging from 52-189 bpm (avg of 93 bpm), the longest lasting 1 day 16 hours with an avg rate of 87 bpm. Atrial Fibrillation was present at activation of device. 2 Pauses occurred, the longest lasting 4.7 secs (13 bpm). Atrial Fibrillation was detected within +/- 45 seconds of symptomatic patient event(s)."  Dr. Fletcher Anon Responded "Please refer to EP for management of atrial fibrillation and possible need for permanent pacemaker  Made Dr. Fletcher Anon Aware that the first Appt for EP here in [place] is 05/10/2020. Tina Peters has an opening Wednesday 04/05/2020. Will offer that to patient.   Called patient. Patient declined going to [facility]. Made Tina Peters , RN aware that we will need a spot to overbook Dr. Quentin Ore.

## 2020-04-07 ENCOUNTER — Ambulatory Visit: Payer: Medicare HMO | Admitting: Nurse Practitioner

## 2020-04-07 ENCOUNTER — Other Ambulatory Visit: Payer: Self-pay

## 2020-04-07 ENCOUNTER — Encounter: Payer: Self-pay | Admitting: Nurse Practitioner

## 2020-04-07 VITALS — BP 134/90 | HR 60 | Ht 65.0 in | Wt 223.0 lb

## 2020-04-07 DIAGNOSIS — I471 Supraventricular tachycardia: Secondary | ICD-10-CM | POA: Diagnosis not present

## 2020-04-07 DIAGNOSIS — I10 Essential (primary) hypertension: Secondary | ICD-10-CM | POA: Diagnosis not present

## 2020-04-07 DIAGNOSIS — I495 Sick sinus syndrome: Secondary | ICD-10-CM

## 2020-04-07 DIAGNOSIS — I480 Paroxysmal atrial fibrillation: Secondary | ICD-10-CM | POA: Diagnosis not present

## 2020-04-07 DIAGNOSIS — I639 Cerebral infarction, unspecified: Secondary | ICD-10-CM

## 2020-04-07 DIAGNOSIS — E782 Mixed hyperlipidemia: Secondary | ICD-10-CM

## 2020-04-07 NOTE — Progress Notes (Signed)
Office Visit    Patient Name: Tina Peters Date of Encounter: 04/07/2020  Primary Care Provider:  Donnie Coffin, MD Primary Cardiologist:  [name], MD  Chief Complaint    69 year old female with a history of paroxysmal atrial fibrillation, PSVT, hypertension, hyperlipidemia, obstructive sleep apnea on CPAP, asthma, tobacco abuse, back pain, and diverticulitis, who presents for follow-up after recent hospitalization for stroke and subsequent finding of tachybradycardia syndrome on monitoring.  Past Medical History    Past Medical History:  Diagnosis Date  . Asthma   . Chronic back pain   . Diastolic dysfunction    a. 02/2020 Echo: EF 60-65%, no rwma. Gr2 DD. Nl RV size/fxn. Mild LAE. Mild MR.  . Diverticulitis   . Hyperlipidemia   . Hypertension   . Lipoma   . Mild mitral regurgitation    a. 08/2016 Echo: mild MR; b. 02/2020 Echo: Mild MR.  . Obesity   . Obstructive sleep apnea   . PAF (paroxysmal atrial fibrillation) ([place])    a. Dx 05/2016--> Xarelto (CHA2DS2VASc = 3); b. 08/2016 Echo: EF 55-60%, no rwma, mild MR, mod dil LA; c. 03/2020 Zio: Avg HR 61 w/ Afib burden of 24% - max rate 189 (avg 93). 2 pauses - longest 4.7 secs post-conversion pauses (occurred @ 5:42 am).  . Paroxysmal SVT (supraventricular tachycardia) ([place])   . Stroke [facility])    a. 02/2020 MRI/A: multifocal acute ischemia in R MCA territory predominantly involving post insula and R middle frontal gyrus. Nl MRA.  . Tachy-brady syndrome ([place])   . Tobacco abuse    Past Surgical History:  Procedure Laterality Date  . ACHILLES TENDON SURGERY    . BACK SURGERY    . KNEE ARTHROSCOPY W/ MENISCAL REPAIR    . KNEE SURGERY    . OVARY SURGERY    . TUMOR REMOVAL     neck    Allergies  No Known Allergies  History of Present Illness    69 year old female with the above complex past medical history including paroxysmal atrial fibrillation, PSVT, hypertension, hyperlipidemia, asthma, back pain,  obstructive sleep apnea on CPAP, tobacco abuse, and diverticulitis.  She had an episode of SVT in 2016 in the setting of hypokalemia, thought to be due to treatment with HCTZ.  She was placed on low-dose, long-acting diltiazem with improvement in symptoms and this was subsequently switched to metoprolol due to leg edema.  In January 2018, she was diagnosed with atrial fibrillation with rapid ventricular spots, which converted to sinus rhythm with diltiazem.  She was placed on Xarelto in the setting of a CHA2DS2-VASc of 3 at that time.  She was last seen in cardiology clinic on October 27, at which time she reported an episode of presyncope lasting a few seconds and resolving spontaneously followed by an episode of acute confusion and loss of fine motor skills that occurred at work the following evening.  She had a severe headache following that episode though altered mental status had resolved by the time of clinic visit.  She was referred to the ED where head CT showed no evidence of bleed however a focal hypodensity and loss of gray-white differentiation the high right frontal lobe was present and concerning for acute or early subacute infarct.  She was admitted and MRI/a showed multifocal acute ischemia in the right MCA territory predominantly involving the posterior insula and right middle frontal gyrus.  MRA and carotid Dopplers were unremarkable.  EEG showed no abnormality concerning for potential  seizure.  She was cleared to resume Xarelto on October 31 by neurology.  Following discharge, a Zio monitor was placed as was previously planned in the setting of sudden onset and offset of presyncope as described above and this showed predominantly sinus rhythm with a 24% A. fib burden.  Average rate in A. fib was 93 with a maximal rate of 189.  2 pauses were noted, the longest occurring post conversion from A. fib at 5:42 AM, and lasting 4.7 seconds.  In the setting of tachybradycardia syndrome, she has been  referred to electrophysiology (scheduled for 12/15).  Since her hospitalization, she has had intermittent dizzy spells and sometimes still notes a little bit of confusion.  She did try to return to work but became very flustered when at [employer] and has not been back to work since.  She has had 2 episodes of presyncope, both lasting just a few seconds and resolving spontaneously.  We discussed her monitoring results today.  She is aware of when she goes in and out of atrial fibrillation though she is not usually particularly symptomatic.  In retrospect, she says she did miss Xarelto from time to time prior to his stroke but has been compliant since.  She has not been having any chest pain or dyspnea and has been exercising more frequently, walking the steps in her house without limitations.  She has noted some indigestion, which she attributes to starting statin therapy.  Unfortunately, she continues to smoke.  She denies PND, orthopnea, edema, or early satiety.  She has follow-up with neurology next week.  Home Medications    Prior to Admission medications   Medication Sig Start Date End Date Taking? Authorizing Provider  acetaminophen (TYLENOL) 325 MG tablet Take 650 mg by mouth every 4 (four) hours as needed.     [provider]  albuterol (PROVENTIL HFA;VENTOLIN HFA) 108 (90 Base) MCG/ACT inhaler 2 puffs q.i.d. p.r.n. short of breath, wheezing, or cough 07/04/15   [provider]  atorvastatin (LIPITOR) 80 MG tablet Take 0.5 tablets (40 mg total) by mouth daily. 03/03/20 06/01/20  Loel Dubonnet, NP  citalopram (CELEXA) 20 MG tablet Take 20 mg by mouth daily.    [provider]  citalopram (CELEXA) 40 MG tablet Take 40 mg by mouth daily. 01/12/20   [provider]  hydrocortisone 2.5 % cream  03/10/20   [provider]  methocarbamol (ROBAXIN) 500 MG tablet Take by mouth. 02/01/20   [provider]  metoprolol succinate (TOPROL-XL) 100 MG  24 hr tablet Take 0.5 tablets (50 mg total) by mouth daily. Take with or immediately following a meal. 03/01/20   Theora Gianotti, NP  naproxen sodium (ALEVE) 220 MG tablet Take 220 mg by mouth daily as needed.    [provider]  rivaroxaban (XARELTO) 20 MG TABS tablet Take 1 tablet (20 mg total) by mouth daily with supper. Take asa 81 mg po daily till Oct 30th, start Xarelto on Oct 31st 03/05/20   Max Sane, MD  traZODone (DESYREL) 50 MG tablet TAKE 1 TABLET BY MOUTH AT BEDTIME AS NEEDED FOR SLEEP MAY INCREASE TO 2 TABLETS AT BEDTIME IF NOT IMPROVED 03/11/18   [provider]    Review of Systems    Continues to have intermittent dizziness with 2 presyncopal spells.  Still becomes flustered very easily when asked with doing something.  Occasionally notes palpitations which he identifies as A. fib.  She denies PND, orthopnea, chest pain,  dyspnea, edema, or early satiety.  All other systems reviewed and are otherwise negative except as noted above.  Physical Exam    VS:  BP 134/90 (BP Location: Left Arm, Patient Position: Sitting, Cuff Size: Normal)   Pulse 60   Ht 5\' 5"  (1.651 m)   Wt 223 lb (101.2 kg)   SpO2 97%   BMI 37.11 kg/m  , BMI Body mass index is 37.11 kg/m. GEN: Well nourished, well developed, in no acute distress. HEENT: normal. Neck: Supple, no JVD, carotid bruits, or masses. Cardiac: RRR, no murmurs, rubs, or gallops. No clubbing, cyanosis, edema.  Radials/PT 2+ and equal bilaterally.  Respiratory:  Respirations regular and unlabored, clear to auscultation bilaterally. GI: Soft, nontender, nondistended, BS + x 4. MS: no deformity or atrophy. Skin: warm and dry, no rash. Neuro:  Strength and sensation are intact. Psych: Normal affect.  Accessory Clinical Findings    ECG personally reviewed by me today -regular sinus rhythm, 60, PAC - no acute changes.  Lab Results  Component Value Date   WBC 9.9 03/01/2020   HGB 14.0 03/01/2020   HCT  43.1 03/01/2020   MCV 89.8 03/01/2020   PLT 238 03/01/2020   Lab Results  Component Value Date   CREATININE 1.13 (H) 03/01/2020   BUN 16 03/01/2020   NA 136 03/01/2020   K 4.3 03/01/2020   CL 100 03/01/2020   CO2 25 03/01/2020   Lab Results  Component Value Date   CHOL 210 (H) 03/02/2020   HDL 41 03/02/2020   LDLCALC 151 (H) 03/02/2020   TRIG 91 03/02/2020   CHOLHDL 5.1 03/02/2020    Lab Results  Component Value Date   HGBA1C 5.7 (H) 03/02/2020    Assessment & Plan    1.  Paroxysmal atrial fibrillation/tachybradycardia syndrome: Patient with known prior history of PAF and anticoagulated Xarelto therapy.  In the setting of recent episodes of presyncope and stroke, she underwent Zio monitoring which showed 24% burden of A. fib with an average rate of 93 and a maximum rate of 189.  Posttermination pauses were noted with the longest being 4.7 seconds.  In that setting, she has been referred to electrophysiology for consideration of permanent pacemaker placement as she will likely also require escalation of therapy for the management of A. fib.  Prior to her recent stroke, she was not always compliant with Xarelto but notes 100% compliance since discharge.  2.  Presyncope: Patient with 2 episodes of presyncope in late October which led to monitoring revealing tachybradycardia syndrome and pauses up to 4.7 seconds.  She has had 2 additional episodes of presyncope since her hospitalization.  She has no history of syncope.  I did advise that given findings on monitoring along with symptoms, that she should not be driving.  She was accepting of this advice.  As above, she is scheduled to see EP next week.  3.  Essential hypertension: Blood pressure mildly elevated today.  I have asked her to follow this at home.  She is currently on 50 mg of metoprolol daily and given prior history bradycardia and pauses, there is no room for titration.  Would have a low threshold to add an ARB.  4.  Recent  stroke: Has follow-up with neurology next week.  Still notes intermittent dizziness and flustered easily with tasks.  Compliant with Xarelto.  5.  PSVT: Quiescent.  6.  Tobacco abuse: Continues to smoke.  Not considering quitting.  Cessation advised.  7.  Obstructive sleep  apnea: Compliant with CPAP.  8.  Hyperlipidemia: Recent placed on statin therapy in the setting of stroke.  LDL was 151 in October.  She believes the atorvastatin is causing indigestion.  Advised that she may hold this for a week to see if symptoms resolve.  If so, she will contact her office and we can place her on rosuvastatin 20 mg daily.  If symptoms do not abate, she is to resume atorvastatin and obtain an over-the-counter antacid.    9.  Disposition: Follow-up with electrophysiology next week in the setting of tachybradycardia syndrome.   Murray Hodgkins, NP 04/07/2020, 1:03 PM

## 2020-04-07 NOTE — Patient Instructions (Signed)
Medication Instructions:  Your physician recommends that you continue on your current medications as directed. Please refer to the Current Medication list given to you today.  *If you need a refill on your cardiac medications before your next appointment, please call your pharmacy*   Lab Work: None ordered.    Testing/Procedures: None ordered   Follow-Up: At [facility], you and your health needs are our priority.  As part of our continuing mission to provide you with exceptional heart care, we have created designated Provider Care Teams.  These Care Teams include your primary Cardiologist (physician) and Advanced Practice Providers (APPs -  Physician Assistants and Nurse Practitioners) who all work together to provide you with the care you need, when you need it.  We recommend signing up for the patient portal called "MyChart".  Sign up information is provided on this After Visit Summary.  MyChart is used to connect with patients for Virtual Visits (Telemedicine).  Patients are able to view lab/test results, encounter notes, upcoming appointments, etc.  Non-urgent messages can be sent to your provider as well.   To learn more about what you can do with MyChart, go to [email or web address].    Your next appointment:    Follow up with Dr. Quentin Ore as scheduled

## 2020-04-19 ENCOUNTER — Ambulatory Visit: Payer: Medicare HMO | Admitting: Cardiology

## 2020-04-19 ENCOUNTER — Other Ambulatory Visit: Payer: Self-pay

## 2020-04-19 ENCOUNTER — Encounter: Payer: Self-pay | Admitting: Cardiology

## 2020-04-19 VITALS — BP 140/90 | HR 51 | Ht 65.5 in | Wt 225.0 lb

## 2020-04-19 DIAGNOSIS — I495 Sick sinus syndrome: Secondary | ICD-10-CM | POA: Diagnosis not present

## 2020-04-19 DIAGNOSIS — I480 Paroxysmal atrial fibrillation: Secondary | ICD-10-CM

## 2020-04-19 DIAGNOSIS — I10 Essential (primary) hypertension: Secondary | ICD-10-CM

## 2020-04-19 MED ORDER — METOPROLOL SUCCINATE ER 25 MG PO TB24: 25.0000 mg | ORAL_TABLET | Freq: Every day | ORAL | 3 refills | Status: DC

## 2020-04-19 MED ORDER — MULTAQ 400 MG PO TABS: 400.0000 mg | ORAL_TABLET | Freq: Two times a day (BID) | ORAL | 3 refills | Status: DC

## 2020-04-19 NOTE — Progress Notes (Signed)
Electrophysiology Office Note:    Date:  04/19/2020   ID:  Tina Peters, DOB Nov 10, 1950, MRN [id number]  PCP:  Donnie Coffin, MD  [facility] Cardiologist:  [name], MD  [facility] Electrophysiologist:  Vickie Epley, MD   Referring MD: Donnie Coffin, MD   Chief Complaint: Atrial fibrillation  History of Present Illness:    Tina Peters is a 69 y.o. female who presents for an evaluation of atrial fibrillation at the request of Dr. Fletcher Anon. Their medical history includes obstructive sleep apnea, symptomatic paroxysmal atrial fibrillation diagnosed in 2018, hypertension, hyperlipidemia, obesity, recent stroke.  She is in clinic today with 2 of her daughters.  She was recently hospitalized on March 01, 2020 for stroke.  Her stroke was a multifocal infarct involving the right MCA, right middle frontal gyrus.  The stroke was thought to be embolic in nature.  According to the patient and hospital records, the patient was not taking her anticoagulation routinely prior to her stroke.  Today she tells me she has been taking her anticoagulation every day since discharge.  She was first discharged on aspirin but then was transitioned to Xarelto 20 mg once daily.  She tells me she is still having trouble with processing concentration.  There is no weakness.  She has been unable to return to work due to the symptoms.  She tells me she can tell when she goes in and out of atrial fibrillation but after learning that her burden of atrial fibrillation was approximately 25%, she thinks she cannot always tell that she is in atrial fibrillation.  Her family tells me that she has a diagnosis of sleep apnea but was borrowing someone else's CPAP machine and there is concerned that it is not set up appropriately for her.  She has seen a neurologist who has referred her for a sleep study.  Past Medical History:  Diagnosis Date   Asthma    Chronic back pain    Diastolic dysfunction     a. 02/2020 Echo: EF 60-65%, no rwma. Gr2 DD. Nl RV size/fxn. Mild LAE. Mild MR.   Diverticulitis    Hyperlipidemia    Hypertension    Lipoma    Mild mitral regurgitation    a. 08/2016 Echo: mild MR; b. 02/2020 Echo: Mild MR.   Obesity    Obstructive sleep apnea    PAF (paroxysmal atrial fibrillation) ([place])    a. Dx 05/2016--> Xarelto (CHA2DS2VASc = 3); b. 08/2016 Echo: EF 55-60%, no rwma, mild MR, mod dil LA; c. 03/2020 Zio: Avg HR 61 w/ Afib burden of 24% - max rate 189 (avg 93). 2 pauses - longest 4.7 secs post-conversion pauses (occurred @ 5:42 am).   Paroxysmal SVT (supraventricular tachycardia) ([place])    Stroke ([place])    a. 02/2020 MRI/A: multifocal acute ischemia in R MCA territory predominantly involving post insula and R middle frontal gyrus. Nl MRA.   Tachy-brady syndrome ([facility])    Tobacco abuse     Past Surgical History:  Procedure Laterality Date   ACHILLES TENDON SURGERY     BACK SURGERY     KNEE ARTHROSCOPY W/ MENISCAL REPAIR     KNEE SURGERY     OVARY SURGERY     TUMOR REMOVAL     neck    Current Medications: Current Meds  Medication Sig   acetaminophen (TYLENOL) 325 MG tablet Take 650 mg by mouth every 4 (four) hours as needed.    atorvastatin (LIPITOR) 80  MG tablet Take 0.5 tablets (40 mg total) by mouth daily.   citalopram (CELEXA) 40 MG tablet Take 40 mg by mouth daily.   hydrocortisone 2.5 % cream Apply 1 application topically as needed.   methocarbamol (ROBAXIN) 500 MG tablet Take by mouth as needed.   rivaroxaban (XARELTO) 20 MG TABS tablet Take 20 mg by mouth daily with supper.   [DISCONTINUED] metoprolol succinate (TOPROL-XL) 100 MG 24 hr tablet Take 0.5 tablets (50 mg total) by mouth daily. Take with or immediately following a meal.     Allergies:   Patient has no known allergies.   Social History   Socioeconomic History   Marital status: Divorced    Spouse name: Not on file   Number of children: Not on file   Years of  education: Not on file   Highest education level: Not on file  Occupational History   Occupation: [employer]   Tobacco Use   Smoking status: Current Some Day Smoker    Packs/day: 0.25    Years: 40.00    Pack years: 10.00    Types: Cigarettes   Smokeless tobacco: Never Used   Tobacco comment: Used to smoke heavily, cut back in 2015  Vaping Use   Vaping Use: Never used  Substance and Sexual Activity   Alcohol use: Yes    Alcohol/week: 0.0 standard drinks    Comment: socially, about 1-2 drinks/week   Drug use: No   Sexual activity: Not on file  Other Topics Concern   Not on file  Social History Narrative   Lives alone.   Social Determinants of Health   Financial Resource Strain: Not on file  Food Insecurity: Not on file  Transportation Needs: Not on file  Physical Activity: Not on file  Stress: Not on file  Social Connections: Not on file     Family History: The patient's family history includes Diabetes in her mother.  ROS:   Please see the history of present illness.    All other systems reviewed and are negative.  EKGs/Labs/Other Studies Reviewed:    The following studies were reviewed today: Hospital records, EKG, echo  March 02, 2020 echo personally reviewed Left ventricular function normal, 60% Right ventricular function normal Mild left atrial dilation No significant valvular abnormalities  April 03, 2020 ZIO personally reviewed A. fib with RVR, burden 24% with the longest episode lasting 1 day and 16 hours with an average heart rate of 93 bpm 2 pauses occurred, longest lasting 4.7 seconds occurring at 5:40 AM.  This was a postconversion pause.   EKG:  The ekg ordered today demonstrates sinus bradycardia with a heart rate of 51 bpm.  Low voltage.  Recent Labs: 03/01/2020: BUN 16; Creatinine, Ser 1.13; Hemoglobin 14.0; Platelets 238; Potassium 4.3; Sodium 136 03/02/2020: TSH 2.563  Recent Lipid Panel    Component Value  Date/Time   CHOL 210 (H) 03/02/2020 0445   TRIG 91 03/02/2020 0445   HDL 41 03/02/2020 0445   CHOLHDL 5.1 03/02/2020 0445   VLDL 18 03/02/2020 0445   LDLCALC 151 (H) 03/02/2020 0445    Physical Exam:    VS:  BP 140/90 (BP Location: Right Arm, Patient Position: Sitting, Cuff Size: Large)    Pulse (!) 51    Ht 5' 5.5" (1.664 m)    Wt 225 lb (102.1 kg)    SpO2 97%    BMI 36.87 kg/m     Wt Readings from Last 3 Encounters:  04/19/20 225 lb (102.1 kg)  04/07/20 223 lb (101.2 kg)  03/01/20 218 lb 4.1 oz (99 kg)     GEN:  Well nourished, well developed in no acute distress.  Obese HEENT: Normal NECK: No JVD; No carotid bruits LYMPHATICS: No lymphadenopathy CARDIAC: RRR, no murmurs, rubs, gallops RESPIRATORY:  Clear to auscultation without rales, wheezing or rhonchi  ABDOMEN: Soft, non-tender, non-distended MUSCULOSKELETAL:  No edema; No deformity  SKIN: Warm and dry NEUROLOGIC:  Alert and oriented x 3 PSYCHIATRIC:  Normal affect   ASSESSMENT:    1. PAF (paroxysmal atrial fibrillation) ([place])   2. Tachycardia-bradycardia syndrome ([place])   3. Essential hypertension    PLAN:    In order of problems listed above:  1. Paroxysmal atrial fibrillation and tachycardia-bradycardia syndrome Patient has a 25% burden of symptomatic atrial fibrillation.  Recent heart monitor showed brief postconversion pauses-none of which were symptomatic.  Ideally, would maintain normal rhythm given the symptomatic nature of her atrial fibrillation.  Also by maintaining normal rhythm would eliminate postconversion pauses.  We discussed the options for maintaining normal rhythm including antiarrhythmic therapy.  We will need to cautiously initiate antiarrhythmics for Ms. Hindes given her resting bradycardia.  We discussed the options for antiarrhythmics including dronedarone, amiodarone, dofetilide.  After discussing the options for antiarrhythmic therapy including the risks of each, the patient would like to  pursue trialing dronedarone.  We will prescribe this and simultaneously decrease her metoprolol dose to 25 mg once daily to minimize the risk of significant bradycardia.  I will plan on seeing her back in 6 to 8 weeks to assess response to therapy.  We did discuss the possibility of her requiring a permanent pacemaker in the future to help manage her symptomatic atrial fibrillation if bradycardia becomes more of an issue.  The patient is hesitant to pursue permanent pacemaker implant at this point and would rather trial medical therapy first which I think is a very reasonable strategy.  Ablation could also be used to help maintain normal rhythm and Ms. Leandro and would also eliminate postconversion pauses if these become more of an issue.  I do not think this is an ideal strategy at this point given the recent stroke hospitalization.  2.  Hypertension Controlled  3.  Obstructive sleep apnea Repeat sleep study pending.   Medication Adjustments/Labs and Tests Ordered: Current medicines are reviewed at length with the patient today.  Concerns regarding medicines are outlined above.  Orders Placed This Encounter  Procedures   EKG 12-Lead   Meds ordered this encounter  Medications   dronedarone (MULTAQ) 400 MG tablet    Sig: Take 1 tablet (400 mg total) by mouth 2 (two) times daily with a meal.    Dispense:  60 tablet    Refill:  3   metoprolol succinate (TOPROL-XL) 25 MG 24 hr tablet    Sig: Take 1 tablet (25 mg total) by mouth daily.    Dispense:  90 tablet    Refill:  3     Signed, [name], MD, [facility]  04/19/2020 2:14 PM    Electrophysiology [facility]

## 2020-04-19 NOTE — Patient Instructions (Addendum)
Medication Instructions:  - Your physician has recommended you make the following change in your medication:   1) START Multaq 400 mg- take 1 tablet by mouth TWICE daily  Samples Given: Multaq 400 mg Lot: [id number]  Exp: 05/2020 # 3 boxes  2) DECREASE metoprolol succinate to 25 mg- take 1 tablet by mouth once daily  *If you need a refill on your cardiac medications before your next appointment, please call your pharmacy*   Lab Work: - none ordered  If you have labs (blood work) drawn today and your tests are completely normal, you will receive your results only by: Marland Kitchen MyChart Message (if you have MyChart) OR . A paper copy in the mail If you have any lab test that is abnormal or we need to change your treatment, we will call you to review the results.   Testing/Procedures: - none ordered   Follow-Up: At [facility], you and your health needs are our priority.  As part of our continuing mission to provide you with exceptional heart care, we have created designated Provider Care Teams.  These Care Teams include your primary Cardiologist (physician) and Advanced Practice Providers (APPs -  Physician Assistants and Nurse Practitioners) who all work together to provide you with the care you need, when you need it.  We recommend signing up for the patient portal called "MyChart".  Sign up information is provided on this After Visit Summary.  MyChart is used to connect with patients for Virtual Visits (Telemedicine).  Patients are able to view lab/test results, encounter notes, upcoming appointments, etc.  Non-urgent messages can be sent to your provider as well.   To learn more about what you can do with MyChart, go to [email or web address].    Your next appointment:   6-8 week(s)  The format for your next appointment:   In Person  Provider:   [name], MD   Other Instructions n/a

## 2020-05-31 ENCOUNTER — Ambulatory Visit: Payer: Medicare HMO | Admitting: Cardiology

## 2020-05-31 ENCOUNTER — Other Ambulatory Visit: Payer: Self-pay

## 2020-05-31 ENCOUNTER — Encounter: Payer: Self-pay | Admitting: Cardiology

## 2020-05-31 VITALS — BP 168/86 | HR 49 | Ht 65.5 in | Wt 227.0 lb

## 2020-05-31 DIAGNOSIS — I495 Sick sinus syndrome: Secondary | ICD-10-CM

## 2020-05-31 DIAGNOSIS — I10 Essential (primary) hypertension: Secondary | ICD-10-CM | POA: Diagnosis not present

## 2020-05-31 DIAGNOSIS — I480 Paroxysmal atrial fibrillation: Secondary | ICD-10-CM

## 2020-05-31 NOTE — Patient Instructions (Signed)
Medication Instructions:  - Your physician recommends that you continue on your current medications as directed. Please refer to the Current Medication list given to you today.  *If you need a refill on your cardiac medications before your next appointment, please call your pharmacy*   Lab Work: - none ordered  If you have labs (blood work) drawn today and your tests are completely normal, you will receive your results only by: . MyChart Message (if you have MyChart) OR . A paper copy in the mail If you have any lab test that is abnormal or we need to change your treatment, we will call you to review the results.   Testing/Procedures: - none ordered   Follow-Up: At [facility], you and your health needs are our priority.  As part of our continuing mission to provide you with exceptional heart care, we have created designated Provider Care Teams.  These Care Teams include your primary Cardiologist (physician) and Advanced Practice Providers (APPs -  Physician Assistants and Nurse Practitioners) who all work together to provide you with the care you need, when you need it.  We recommend signing up for the patient portal called "MyChart".  Sign up information is provided on this After Visit Summary.  MyChart is used to connect with patients for Virtual Visits (Telemedicine).  Patients are able to view lab/test results, encounter notes, upcoming appointments, etc.  Non-urgent messages can be sent to your provider as well.   To learn more about what you can do with MyChart, go to [email or web address].    Your next appointment:   6 month(s)  The format for your next appointment:   In Person  Provider:   Cameron Lambert, MD   Other Instructions n/a  

## 2020-05-31 NOTE — Progress Notes (Signed)
Electrophysiology Office Follow up Visit Note:    Date:  05/31/2020   ID:  Tina Peters, DOB Aug 26, 1950, MRN [id number]  PCP:  Donnie Coffin, MD  [place] Cardiologist:  [name], MD  [facility] Electrophysiologist:  Vickie Epley, MD    Interval History:    Tina Peters is a 70 y.o. female who presents for a follow up visit.  I last saw the patient April 19, 2020 for atrial fibrillation.  She is maintained on Xarelto 20 mg daily.  At the last appointment, dronedarone was initiated in an effort to maintain normal rhythm.  Since I last saw the patient, she reports doing very well.  She is joined a gym and is exercising regularly.  She is walking and doing some weightlifting machines.  He is overall feeling very good.    Past Medical History:  Diagnosis Date  . Asthma   . Chronic back pain   . Diastolic dysfunction    a. 02/2020 Echo: EF 60-65%, no rwma. Gr2 DD. Nl RV size/fxn. Mild LAE. Mild MR.  . Diverticulitis   . Hyperlipidemia   . Hypertension   . Lipoma   . Mild mitral regurgitation    a. 08/2016 Echo: mild MR; b. 02/2020 Echo: Mild MR.  . Obesity   . Obstructive sleep apnea   . PAF (paroxysmal atrial fibrillation) ([place])    a. Dx 05/2016--> Xarelto (CHA2DS2VASc = 3); b. 08/2016 Echo: EF 55-60%, no rwma, mild MR, mod dil LA; c. 03/2020 Zio: Avg HR 61 w/ Afib burden of 24% - max rate 189 (avg 93). 2 pauses - longest 4.7 secs post-conversion pauses (occurred @ 5:42 am).  . Paroxysmal SVT (supraventricular tachycardia) ([place])   . Stroke [facility])    a. 02/2020 MRI/A: multifocal acute ischemia in R MCA territory predominantly involving post insula and R middle frontal gyrus. Nl MRA.  . Tachy-brady syndrome ([place])   . Tobacco abuse     Past Surgical History:  Procedure Laterality Date  . ACHILLES TENDON SURGERY    . BACK SURGERY    . KNEE ARTHROSCOPY W/ MENISCAL REPAIR    . KNEE SURGERY    . OVARY SURGERY    . TUMOR REMOVAL     neck    Current  Medications: Current Meds  Medication Sig  . acetaminophen (TYLENOL) 325 MG tablet Take 650 mg by mouth every 4 (four) hours as needed.   Marland Kitchen atorvastatin (LIPITOR) 40 MG tablet SMARTSIG:1 Tablet(s) By Mouth Every Evening  . citalopram (CELEXA) 40 MG tablet Take 40 mg by mouth daily.  Marland Kitchen dronedarone (MULTAQ) 400 MG tablet Take 1 tablet (400 mg total) by mouth 2 (two) times daily with a meal.  . hydrocortisone 2.5 % cream Apply 1 application topically as needed.  . methocarbamol (ROBAXIN) 500 MG tablet Take by mouth as needed.  . metoprolol succinate (TOPROL-XL) 25 MG 24 hr tablet Take 1 tablet (25 mg total) by mouth daily.  . rivaroxaban (XARELTO) 20 MG TABS tablet Take 20 mg by mouth daily with supper.     Allergies:   Patient has no known allergies.   Social History   Socioeconomic History  . Marital status: Divorced    Spouse name: Not on file  . Number of children: Not on file  . Years of education: Not on file  . Highest education level: Not on file  Occupational History  . Occupation: Environmental consultant   Tobacco Use  . Smoking status: Current Some  Day Smoker    Packs/day: 0.25    Years: 40.00    Pack years: 10.00    Types: Cigarettes  . Smokeless tobacco: Never Used  . Tobacco comment: Used to smoke heavily, cut back in 2015  Vaping Use  . Vaping Use: Never used  Substance and Sexual Activity  . Alcohol use: Yes    Alcohol/week: 0.0 standard drinks    Comment: socially, about 1-2 drinks/week  . Drug use: No  . Sexual activity: Not on file  Other Topics Concern  . Not on file  Social History Narrative   Lives alone.   Social Determinants of Health   Financial Resource Strain: Not on file  Food Insecurity: Not on file  Transportation Needs: Not on file  Physical Activity: Not on file  Stress: Not on file  Social Connections: Not on file     Family History: The patient's family history includes Diabetes in her mother.  ROS:   Please see the history of  present illness.    All other systems reviewed and are negative.  EKGs/Labs/Other Studies Reviewed:    The following studies were reviewed today:   EKG:  The ekg ordered today demonstrates sinus rhythm with a ventricular rate of 49 bpm.  Recent Labs: 03/01/2020: BUN 16; Creatinine, Ser 1.13; Hemoglobin 14.0; Platelets 238; Potassium 4.3; Sodium 136 03/02/2020: TSH 2.563  Recent Lipid Panel    Component Value Date/Time   CHOL 210 (H) 03/02/2020 0445   TRIG 91 03/02/2020 0445   HDL 41 03/02/2020 0445   CHOLHDL 5.1 03/02/2020 0445   VLDL 18 03/02/2020 0445   LDLCALC 151 (H) 03/02/2020 0445    Physical Exam:    VS:  BP (!) 168/86 (BP Location: Left Arm, Patient Position: Sitting, Cuff Size: Normal)   Pulse (!) 49   Ht 5' 5.5" (1.664 m)   Wt 227 lb (103 kg)   SpO2 98%   BMI 37.20 kg/m     Wt Readings from Last 3 Encounters:  05/31/20 227 lb (103 kg)  04/19/20 225 lb (102.1 kg)  04/07/20 223 lb (101.2 kg)     GEN:  Well nourished, well developed in no acute distress.  Obese HEENT: Normal NECK: No JVD; No carotid bruits LYMPHATICS: No lymphadenopathy CARDIAC: RRR, no murmurs, rubs, gallops RESPIRATORY:  Clear to auscultation without rales, wheezing or rhonchi  ABDOMEN: Soft, non-tender, non-distended MUSCULOSKELETAL:  No edema; No deformity  SKIN: Warm and dry NEUROLOGIC:  Alert and oriented x 3 PSYCHIATRIC:  Normal affect   ASSESSMENT:    No diagnosis found. PLAN:    In order of problems listed above:  1. Paroxysmal atrial fibrillation and tachybradycardia syndrome Maintaining normal rhythm on dronedarone. Continue Xarelto for stroke prophylaxis Continue metoprolol  2.  Hypertension  recheck today manually was 142/84. I like her to keep a check on her blood pressures at home.  If persistently elevated greater than 140 mmHg, consider starting amlodipine and next appointment.  Plan to follow-up 6 months or sooner as needed.   Medication Adjustments/Labs  and Tests Ordered: Current medicines are reviewed at length with the patient today.  Concerns regarding medicines are outlined above.  No orders of the defined types were placed in this encounter.  No orders of the defined types were placed in this encounter.    Signed, [name], MD, [facility]  05/31/2020 11:56 AM    Electrophysiology [facility]

## 2020-07-18 ENCOUNTER — Ambulatory Visit: Payer: Medicare HMO | Attending: Internal Medicine

## 2020-07-20 ENCOUNTER — Ambulatory Visit: Payer: Medicare HMO | Attending: Neurology

## 2020-07-20 DIAGNOSIS — G4733 Obstructive sleep apnea (adult) (pediatric): Secondary | ICD-10-CM | POA: Diagnosis present

## 2020-07-21 ENCOUNTER — Other Ambulatory Visit: Payer: Self-pay

## 2020-08-26 ENCOUNTER — Other Ambulatory Visit: Payer: Self-pay | Admitting: Cardiology

## 2020-10-09 ENCOUNTER — Other Ambulatory Visit: Payer: Self-pay

## 2020-10-09 MED ORDER — MULTAQ 400 MG PO TABS
400.0000 mg | ORAL_TABLET | Freq: Two times a day (BID) | ORAL | 0 refills | Status: DC
Start: 2020-10-09 — End: 2020-12-11

## 2020-10-09 MED ORDER — METOPROLOL SUCCINATE ER 25 MG PO TB24
25.0000 mg | ORAL_TABLET | Freq: Every day | ORAL | 0 refills | Status: DC
Start: 2020-10-09 — End: 2020-11-13

## 2020-10-30 ENCOUNTER — Other Ambulatory Visit: Payer: Self-pay | Admitting: Family Medicine

## 2020-10-30 DIAGNOSIS — Z1231 Encounter for screening mammogram for malignant neoplasm of breast: Secondary | ICD-10-CM

## 2020-10-30 DIAGNOSIS — M8580 Other specified disorders of bone density and structure, unspecified site: Secondary | ICD-10-CM

## 2020-11-12 ENCOUNTER — Other Ambulatory Visit: Payer: Self-pay | Admitting: Cardiology

## 2020-12-11 ENCOUNTER — Other Ambulatory Visit: Payer: Self-pay

## 2020-12-11 MED ORDER — MULTAQ 400 MG PO TABS
400.0000 mg | ORAL_TABLET | Freq: Two times a day (BID) | ORAL | 0 refills | Status: DC
Start: 2020-12-11 — End: 2021-02-19

## 2020-12-13 ENCOUNTER — Other Ambulatory Visit: Payer: Self-pay

## 2020-12-13 ENCOUNTER — Ambulatory Visit: Payer: Medicare Other | Admitting: Cardiology

## 2020-12-13 ENCOUNTER — Encounter: Payer: Self-pay | Admitting: Cardiology

## 2020-12-13 VITALS — BP 140/80 | HR 49 | Ht 65.5 in | Wt 229.0 lb

## 2020-12-13 DIAGNOSIS — I480 Paroxysmal atrial fibrillation: Secondary | ICD-10-CM | POA: Diagnosis not present

## 2020-12-13 DIAGNOSIS — I10 Essential (primary) hypertension: Secondary | ICD-10-CM | POA: Diagnosis not present

## 2020-12-13 NOTE — Patient Instructions (Signed)
Medication Instructions:  Your physician recommends that you continue on your current medications as directed. Please refer to the Current Medication list given to you today.  *If you need a refill on your cardiac medications before your next appointment, please call your pharmacy*   Lab Work: None ordered.  If you have labs (blood work) drawn today and your tests are completely normal, you will receive your results only by: [place] (if you have MyChart) OR A paper copy in the mail If you have any lab test that is abnormal or we need to change your treatment, we will call you to review the results.   Testing/Procedures: None ordered.    Follow-Up: At [facility], you and your health needs are our priority.  As part of our continuing mission to provide you with exceptional heart care, we have created designated Provider Care Teams.  These Care Teams include your primary Cardiologist (physician) and Advanced Practice Providers (APPs -  Physician Assistants and Nurse Practitioners) who all work together to provide you with the care you need, when you need it.  We recommend signing up for the patient portal called "MyChart".  Sign up information is provided on this After Visit Summary.  MyChart is used to connect with patients for Virtual Visits (Telemedicine).  Patients are able to view lab/test results, encounter notes, upcoming appointments, etc.  Non-urgent messages can be sent to your provider as well.   To learn more about what you can do with MyChart, go to [email or web address].    Your next appointment:   6 month(s)  The format for your next appointment:   In Person  Provider:   You will see one of the following Advanced Practice Providers on your designated Care Team:   Murray Hodgkins, NP Christell Faith, PA-C Marrianne Mood, PA-C Cadence Kathlen Mody, [place]  Then, Dr [name] will plan to see you again in 12 month(s).

## 2020-12-13 NOTE — Progress Notes (Signed)
Electrophysiology Office Follow up Visit Note:    Date:  12/13/2020   ID:  Tina Peters, DOB 1951/03/23, MRN [id number]  PCP:  Donnie Coffin, MD  [place] Cardiologist:  [name], MD  [facility] Electrophysiologist:  Vickie Epley, MD    Interval History:    Tina Peters is a 70 y.o. female who presents for a follow up visit. They were last seen in clinic on May 31, 2020 for atrial fibrillation.  She takes dronedarone to help maintain normal rhythm.  She takes Xarelto for stroke prophylaxis.  Since I last saw the patient she has been doing very well without breakthrough episodes of atrial fibrillation.  No lightheadedness or dizziness.  No presyncope.  No syncope.     Past Medical History:  Diagnosis Date   Asthma    Chronic back pain    Diastolic dysfunction    a. 02/2020 Echo: EF 60-65%, no rwma. Gr2 DD. Nl RV size/fxn. Mild LAE. Mild MR.   Diverticulitis    Hyperlipidemia    Hypertension    Lipoma    Mild mitral regurgitation    a. 08/2016 Echo: mild MR; b. 02/2020 Echo: Mild MR.   Obesity    Obstructive sleep apnea    PAF (paroxysmal atrial fibrillation) ([place])    a. Dx 05/2016--> Xarelto (CHA2DS2VASc = 3); b. 08/2016 Echo: EF 55-60%, no rwma, mild MR, mod dil LA; c. 03/2020 Zio: Avg HR 61 w/ Afib burden of 24% - max rate 189 (avg 93). 2 pauses - longest 4.7 secs post-conversion pauses (occurred @ 5:42 am).   Paroxysmal SVT (supraventricular tachycardia) ([place])    Stroke ([place])    a. 02/2020 MRI/A: multifocal acute ischemia in R MCA territory predominantly involving post insula and R middle frontal gyrus. Nl MRA.   Tachy-brady syndrome ([facility])    Tobacco abuse     Past Surgical History:  Procedure Laterality Date   ACHILLES TENDON SURGERY     BACK SURGERY     KNEE ARTHROSCOPY W/ MENISCAL REPAIR     KNEE SURGERY     OVARY SURGERY     TUMOR REMOVAL     neck    Current Medications: Current Meds  Medication Sig   acetaminophen (TYLENOL)  325 MG tablet Take 650 mg by mouth every 4 (four) hours as needed.    atorvastatin (LIPITOR) 40 MG tablet SMARTSIG:1 Tablet(s) By Mouth Every Evening   citalopram (CELEXA) 40 MG tablet Take 40 mg by mouth daily.   dronedarone (MULTAQ) 400 MG tablet Take 1 tablet (400 mg total) by mouth 2 (two) times daily with a meal.   hydrocortisone 2.5 % cream Apply 1 application topically as needed.   methocarbamol (ROBAXIN) 500 MG tablet Take by mouth as needed.   metoprolol succinate (TOPROL-XL) 25 MG 24 hr tablet TAKE 1 TABLET BY MOUTH  DAILY   rivaroxaban (XARELTO) 20 MG TABS tablet Take 20 mg by mouth daily with supper.     Allergies:   Patient has no known allergies.   Social History   Socioeconomic History   Marital status: Divorced    Spouse name: Not on file   Number of children: Not on file   Years of education: Not on file   Highest education level: Not on file  Occupational History   Occupation: [employer]   Tobacco Use   Smoking status: Some Days    Packs/day: 0.25    Years: 40.00    Pack years: 10.00  Types: Cigarettes   Smokeless tobacco: Never   Tobacco comments:    Used to smoke heavily, cut back in 2015  Vaping Use   Vaping Use: Never used  Substance and Sexual Activity   Alcohol use: Yes    Alcohol/week: 0.0 standard drinks    Comment: socially, about 1-2 drinks/week   Drug use: No   Sexual activity: Not on file  Other Topics Concern   Not on file  Social History Narrative   Lives alone.   Social Determinants of Health   Financial Resource Strain: Not on file  Food Insecurity: Not on file  Transportation Needs: Not on file  Physical Activity: Not on file  Stress: Not on file  Social Connections: Not on file     Family History: The patient's family history includes Diabetes in her mother.  ROS:   Please see the history of present illness.    All other systems reviewed and are negative.  EKGs/Labs/Other Studies Reviewed:    The following  studies were reviewed today:    EKG:  The ekg ordered today demonstrates sinus rhythm with PAC.  Intervals are normal.  Recent Labs: 03/01/2020: BUN 16; Creatinine, Ser 1.13; Hemoglobin 14.0; Platelets 238; Potassium 4.3; Sodium 136 03/02/2020: TSH 2.563  Recent Lipid Panel    Component Value Date/Time   CHOL 210 (H) 03/02/2020 0445   TRIG 91 03/02/2020 0445   HDL 41 03/02/2020 0445   CHOLHDL 5.1 03/02/2020 0445   VLDL 18 03/02/2020 0445   LDLCALC 151 (H) 03/02/2020 0445    Physical Exam:    VS:  BP 140/80 (BP Location: Left Arm, Patient Position: Sitting, Cuff Size: Large)   Pulse (!) 49   Ht 5' 5.5" (1.664 m)   Wt 229 lb (103.9 kg)   SpO2 98%   BMI 37.53 kg/m     Wt Readings from Last 3 Encounters:  12/13/20 229 lb (103.9 kg)  05/31/20 227 lb (103 kg)  04/19/20 225 lb (102.1 kg)     GEN:  Well nourished, well developed in no acute distress HEENT: Normal NECK: No JVD; No carotid bruits LYMPHATICS: No lymphadenopathy CARDIAC: RRR, no murmurs, rubs, gallops RESPIRATORY:  Clear to auscultation without rales, wheezing or rhonchi  ABDOMEN: Soft, non-tender, non-distended MUSCULOSKELETAL:  No edema; No deformity  SKIN: Warm and dry NEUROLOGIC:  Alert and oriented x 3 PSYCHIATRIC:  Normal affect   ASSESSMENT:    1. Paroxysmal atrial fibrillation (HCC)   2. Primary hypertension    PLAN:    In order of problems listed above:   1. Paroxysmal atrial fibrillation (HCC) The patient is maintaining sinus rhythm on Multaq.  She should continue Xarelto for stroke prophylaxis.  She is also taking metoprolol succinate 25 mg by mouth once daily.  2. Primary hypertension Upper limit of normal today.  Continue current medical regimen.    Follow-up with one of the PA/NP's in 6 months. Follow-up with me in 12 months.     Medication Adjustments/Labs and Tests Ordered: Current medicines are reviewed at length with the patient today.  Concerns regarding medicines are  outlined above.  Orders Placed This Encounter  Procedures   EKG 12-Lead   No orders of the defined types were placed in this encounter.    Signed, [name], MD, [facility], [facility] 12/13/2020 9:21 PM    Electrophysiology  [facility]

## 2021-02-18 ENCOUNTER — Other Ambulatory Visit: Payer: Self-pay | Admitting: Cardiology

## 2021-05-15 ENCOUNTER — Telehealth: Payer: Self-pay | Admitting: Cardiology

## 2021-05-15 NOTE — Telephone Encounter (Signed)
Patient daughter calling States that patient has tested positive for COVID Her BP has been low 112/54 and has been weak Would like to know if this low BP is concerning Please call to discuss

## 2021-05-15 NOTE — Telephone Encounter (Signed)
Per MD Quentin Ore BP okay. Advised to make sure she stays hydrated.  Patient daughter (DPR) verbalized understanding and agreement.

## 2021-06-18 ENCOUNTER — Other Ambulatory Visit: Payer: Self-pay | Admitting: Cardiology

## 2021-06-20 ENCOUNTER — Encounter: Payer: Self-pay | Admitting: Cardiology

## 2021-06-20 ENCOUNTER — Ambulatory Visit: Payer: Medicare Other | Admitting: Cardiology

## 2021-06-20 ENCOUNTER — Other Ambulatory Visit: Payer: Self-pay

## 2021-06-20 VITALS — BP 148/82 | HR 59 | Ht 65.5 in | Wt 227.0 lb

## 2021-06-20 DIAGNOSIS — I10 Essential (primary) hypertension: Secondary | ICD-10-CM

## 2021-06-20 DIAGNOSIS — I480 Paroxysmal atrial fibrillation: Secondary | ICD-10-CM | POA: Diagnosis not present

## 2021-06-20 DIAGNOSIS — I495 Sick sinus syndrome: Secondary | ICD-10-CM | POA: Diagnosis not present

## 2021-06-20 NOTE — Patient Instructions (Addendum)
Medications: Your physician recommends that you continue on your current medications as directed. Please refer to the Current Medication list given to you today. *If you need a refill on your cardiac medications before your next appointment, please call your pharmacy*  Lab Work: None. If you have labs (blood work) drawn today and your tests are completely normal, you will receive your results only by: [place] (if you have MyChart) OR A paper copy in the mail If you have any lab test that is abnormal or we need to change your treatment, we will call you to review the results.  Testing/Procedures: None.  Follow-Up: At [facility], you and your health needs are our priority.  As part of our continuing mission to provide you with exceptional heart care, we have created designated Provider Care Teams.  These Care Teams include your primary Cardiologist (physician) and Advanced Practice Providers (APPs -  Physician Assistants and Nurse Practitioners) who all work together to provide you with the care you need, when you need it.  Your physician wants you to follow-up in: 6 months with [name] or one of the following Advanced Practice Providers on your designated Care Team:    Ignacia Bayley, NP Christell Faith PA Cadence Kathlen Mody PA    You will receive a reminder letter in the mail two months in advance. If you don't receive a letter, please call our office to schedule the follow-up appointment.  We recommend signing up for the patient portal called "MyChart".  Sign up information is provided on this After Visit Summary.  MyChart is used to connect with patients for Virtual Visits (Telemedicine).  Patients are able to view lab/test results, encounter notes, upcoming appointments, etc.  Non-urgent messages can be sent to your provider as well.   To learn more about what you can do with MyChart, go to [email or web address].    Any Other Special Instructions Will Be Listed Below (If  Applicable).

## 2021-06-20 NOTE — Progress Notes (Signed)
Electrophysiology Office Follow up Visit Note:    Date:  06/20/2021   ID:  Tina Peters, DOB 27-Nov-1950, MRN [id number]  PCP:  Donnie Coffin, MD  [place] Cardiologist:  [name], MD  [facility] Electrophysiologist:  Vickie Epley, MD    Interval History:    Tina Peters is a 72 y.o. female who presents for a follow up visit. They were last seen in clinic December 13, 2020 for atrial fibrillation.  She is on Multaq and Xarelto.  At her last appointment she was doing well without recurrence of atrial fibrillation.  Today she continues to do well without recurrence of her arrhythmia.  She continues to take Xarelto for stroke prophylaxis and dronedarone.  She has been off her citalopram since October.  She is interested in talking to a primary care physician about an alternative antidepressant.  She would like a referral to a primary care physician.     Past Medical History:  Diagnosis Date   Asthma    Chronic back pain    Diastolic dysfunction    a. 02/2020 Echo: EF 60-65%, no rwma. Gr2 DD. Nl RV size/fxn. Mild LAE. Mild MR.   Diverticulitis    Hyperlipidemia    Hypertension    Lipoma    Mild mitral regurgitation    a. 08/2016 Echo: mild MR; b. 02/2020 Echo: Mild MR.   Obesity    Obstructive sleep apnea    PAF (paroxysmal atrial fibrillation) ([place])    a. Dx 05/2016--> Xarelto (CHA2DS2VASc = 3); b. 08/2016 Echo: EF 55-60%, no rwma, mild MR, mod dil LA; c. 03/2020 Zio: Avg HR 61 w/ Afib burden of 24% - max rate 189 (avg 93). 2 pauses - longest 4.7 secs post-conversion pauses (occurred @ 5:42 am).   Paroxysmal SVT (supraventricular tachycardia) ([place])    Stroke ([place])    a. 02/2020 MRI/A: multifocal acute ischemia in R MCA territory predominantly involving post insula and R middle frontal gyrus. Nl MRA.   Tachy-brady syndrome (HCC)    Tobacco abuse     Past Surgical History:  Procedure Laterality Date   ACHILLES TENDON SURGERY     BACK SURGERY     KNEE  ARTHROSCOPY W/ MENISCAL REPAIR     KNEE SURGERY     OVARY SURGERY     TUMOR REMOVAL     neck    Current Medications: Current Meds  Medication Sig   acetaminophen (TYLENOL) 325 MG tablet Take 650 mg by mouth every 4 (four) hours as needed.    atorvastatin (LIPITOR) 40 MG tablet SMARTSIG:1 Tablet(s) By Mouth Every Evening   hydrocortisone 2.5 % cream Apply 1 application topically as needed.   methocarbamol (ROBAXIN) 500 MG tablet Take by mouth as needed.   metoprolol succinate (TOPROL-XL) 25 MG 24 hr tablet TAKE 1 TABLET BY MOUTH  DAILY   MULTAQ 400 MG tablet TAKE 1 TABLET BY MOUTH  TWICE DAILY WITH A MEAL   rivaroxaban (XARELTO) 20 MG TABS tablet Take 20 mg by mouth daily with supper.     Allergies:   Patient has no known allergies.   Social History   Socioeconomic History   Marital status: Divorced    Spouse name: Not on file   Number of children: Not on file   Years of education: Not on file   Highest education level: Not on file  Occupational History   Occupation: [employer]   Tobacco Use   Smoking status: Some Days  Packs/day: 0.25    Years: 40.00    Pack years: 10.00    Types: Cigarettes   Smokeless tobacco: Never   Tobacco comments:    Used to smoke heavily, cut back in 2015  Vaping Use   Vaping Use: Never used  Substance and Sexual Activity   Alcohol use: Yes    Alcohol/week: 0.0 standard drinks    Comment: socially, about 1-2 drinks/week   Drug use: No   Sexual activity: Not on file  Other Topics Concern   Not on file  Social History Narrative   Lives alone.   Social Determinants of Health   Financial Resource Strain: Not on file  Food Insecurity: Not on file  Transportation Needs: Not on file  Physical Activity: Not on file  Stress: Not on file  Social Connections: Not on file     Family History: The patient's family history includes Diabetes in her mother.  ROS:   Please see the history of present illness.    All other systems  reviewed and are negative.  EKGs/Labs/Other Studies Reviewed:    The following studies were reviewed today:   EKG:  The ekg ordered today demonstrates sinus rhythm with PACs.  Recent Labs: No results found for requested labs within last 8760 hours.  Recent Lipid Panel    Component Value Date/Time   CHOL 210 (H) 03/02/2020 0445   TRIG 91 03/02/2020 0445   HDL 41 03/02/2020 0445   CHOLHDL 5.1 03/02/2020 0445   VLDL 18 03/02/2020 0445   LDLCALC 151 (H) 03/02/2020 0445    Physical Exam:    VS:  BP (!) 148/82 (BP Location: Left Arm, Patient Position: Sitting, Cuff Size: Large)    Pulse (!) 59    Ht 5' 5.5" (1.664 m)    Wt 227 lb (103 kg)    SpO2 97%    BMI 37.20 kg/m     Wt Readings from Last 3 Encounters:  06/20/21 227 lb (103 kg)  12/13/20 229 lb (103.9 kg)  05/31/20 227 lb (103 kg)     GEN:  Well nourished, well developed in no acute distress HEENT: Normal NECK: No JVD; No carotid bruits LYMPHATICS: No lymphadenopathy CARDIAC: RRR, no murmurs, rubs, gallops RESPIRATORY:  Clear to auscultation without rales, wheezing or rhonchi  ABDOMEN: Soft, non-tender, non-distended MUSCULOSKELETAL:  No edema; No deformity  SKIN: Warm and dry NEUROLOGIC:  Alert and oriented x 3 PSYCHIATRIC:  Normal affect        ASSESSMENT:    1. Paroxysmal atrial fibrillation (HCC)   2. Primary hypertension   3. Tachycardia-bradycardia syndrome ([place])    PLAN:    In order of problems listed above:  #Paroxysmal atrial fibrillation Maintaining normal rhythm on Multaq.  On Xarelto for stroke prophylaxis.  She should continue both of these medications.  We will follow-up with her in 6 months for EKG and to reassess for symptom burden.  If her arrhythmia burden were to go up significantly with time, could consider catheter ablation to help achieve rhythm control.  #Hypertension Slightly above goal today I recommend she check her blood pressures 1-2 times per week at home and record these  values.  She should bring these with her to her primary care appointment.  She has requested a referral to her primary care physician.  I have placed a referral for [place] [facility].  Follow-up 6 months with APP.   Medication Adjustments/Labs and Tests Ordered: Current medicines are reviewed at length with the patient  today.  Concerns regarding medicines are outlined above.  Orders Placed This Encounter  Procedures   Ambulatory referral to [facility]   EKG 12-Lead   No orders of the defined types were placed in this encounter.    Signed, [name], MD, [facility], [facility] 06/20/2021 9:24 PM    Electrophysiology [facility]

## 2021-06-21 NOTE — Progress Notes (Signed)
Appointment Scheduled for 08/16/2021

## 2021-07-24 LAB — CBC AND DIFFERENTIAL
HCT: 41 (ref 36–46)
Hemoglobin: 13.3 (ref 12.0–16.0)
Neutrophils Absolute: 8.61
Platelets: 210 10*3/uL (ref 150–400)
WBC: 11.9

## 2021-07-24 LAB — BASIC METABOLIC PANEL
BUN: 11 (ref 4–21)
CO2: 28 — AB (ref 13–22)
Chloride: 105 (ref 99–108)
Creatinine: 1.1 (ref 0.5–1.1)
Glucose: 118
Potassium: 3.9 mEq/L (ref 3.5–5.1)
Sodium: 140 (ref 137–147)

## 2021-07-24 LAB — COMPREHENSIVE METABOLIC PANEL
Albumin: 3.8 (ref 3.5–5.0)
Calcium: 9.3 (ref 8.7–10.7)
eGFR: 49

## 2021-07-24 LAB — HEPATIC FUNCTION PANEL
ALT: 21 U/L (ref 7–35)
AST: 25 (ref 13–35)
Alkaline Phosphatase: 97 (ref 25–125)
Bilirubin, Total: 0.6

## 2021-07-24 LAB — CBC: RBC: 4.59 (ref 3.87–5.11)

## 2021-08-06 ENCOUNTER — Telehealth: Payer: Self-pay | Admitting: Cardiovascular Disease

## 2021-08-06 NOTE — Telephone Encounter (Signed)
Patient c/o Palpitations:  High priority if patient c/o lightheadedness, shortness of breath, or chest pain ? ?How long have you had palpitations/irregular HR/ Afib? Are you having the symptoms now? Last week, no not now ? ?Are you currently experiencing lightheadedness, SOB or CP? no ? ?Do you have a history of afib (atrial fibrillation) or irregular heart rhythm? Yes ? ?Have you checked your BP or HR? (document readings if available): no ? ?Are you experiencing any other symptoms? Left arm in pain but had previous injury to that arm and doesn't know if it's coming from that. Pt states that she was taken off her anti depressants and thinks that may have something to do with it. Please advise ?

## 2021-08-06 NOTE — Telephone Encounter (Signed)
Patient stated her PCP told her she was in afib 2 weeks ago. Patient denies palpitations, lightheadedness, dizziness, or any chest or back discomfort. She denies sob and sounded good on the phone. Speech clear. Her rt. arm pain is "from an injury." Stated she has not taken anxiety medication since October and thinks she may be nervous and that her daughter makes her nervous about afib and having a stroke. Recommended to patient that if she feels palpitations, lightheadedness, or dizziness, she should have someone take her to the ED or urgent care. She it taking medications as prescribed. ?

## 2021-08-07 NOTE — Telephone Encounter (Signed)
I have not seen her in 3 years.  I would defer to Dr. Quentin Ore. ?

## 2021-08-15 NOTE — Progress Notes (Signed)
? ? ?I,Sulibeya S Dimas,acting as a scribe for Lavon Paganini, MD.,have documented all relevant documentation on the behalf of Lavon Paganini, MD,as directed by  Lavon Paganini, MD while in the presence of Lavon Paganini, MD. ? ?New patient visit ? ? ?Patient: Tina Peters   DOB: 1951/05/05   71 y.o. Female  MRN: [id number] ?Visit Date: 08/16/2021 ? ?Today's healthcare provider: Lavon Paganini, MD  ? ?Chief Complaint  ?Patient presents with  ? New Patient (Initial Visit)  ? ?Subjective  ?  ?Tina Peters is a 71 y.o. female who presents today as a new patient to establish care.  ?HPI  ?Patient transferring care from [facility]. Will send ROI ? ?States that she was on an antidepressant. She was seen at [facility] and they told her she had anxiety and was having a panic attack when she felt like she was in a fib.  She has been off of her antidepressant since 02/2021.  She is feeling more symptoms since being off of it.  Her mom is in very poor health and her dog recently died.  Later states it was Klonopin.  Cardiology told her it was better to try something else.  States she tried some other meds in the past, but it has ben a long time.  She did not do well on Prozac in the past.   ? ?Sees cardiology and EP for pAF, pSVT, diastolic dysfunction, mild mitral regurg, HTN, HLD ? ?Asthma is intermittent - she takes albuterol prn.  Never used controller or been hospitalized or needed oxygen ? ?F/b [facility] for OSA and h/o CVA in 10/21. Thought this could have been related to poor compliance with xarelto for pAF preceeding that. ? ?Past Medical History:  ?Diagnosis Date  ? Allergy   ? Anxiety   ? Arthritis   ? Asthma   ? Chronic back pain   ? Depression   ? Diastolic dysfunction   ? a. 02/2020 Echo: EF 60-65%, no rwma. Gr2 DD. Nl RV size/fxn. Mild LAE. Mild MR.  ? Diverticulitis   ? Hyperlipidemia   ? Hypertension   ? Lipoma   ? Mild mitral regurgitation   ? a. 08/2016 Echo: mild MR; b. 02/2020  Echo: Mild MR.  ? Neuromuscular disorder ([place])   ? Obesity   ? Obstructive sleep apnea   ? PAF (paroxysmal atrial fibrillation) ([place])   ? a. Dx 05/2016--> Xarelto (CHA2DS2VASc = 3); b. 08/2016 Echo: EF 55-60%, no rwma, mild MR, mod dil LA; c. 03/2020 Zio: Avg HR 61 w/ Afib burden of 24% - max rate 189 (avg 93). 2 pauses - longest 4.7 secs post-conversion pauses (occurred @ 5:42 am).  ? Paroxysmal SVT (supraventricular tachycardia) ([place])   ? Sleep apnea   ? Stroke [facility])   ? a. 02/2020 MRI/A: multifocal acute ischemia in R MCA territory predominantly involving post insula and R middle frontal gyrus. Nl MRA.  ? Tachy-brady syndrome ([place])   ? Tobacco abuse   ? ?Past Surgical History:  ?Procedure Laterality Date  ? ACHILLES TENDON SURGERY Left   ? BACK SURGERY    ? 2 ruptured discs in L spine, no hardware  ? KNEE ARTHROSCOPY W/ MENISCAL REPAIR Right   ? in the 1990s  ? SALPINGOOPHORECTOMY Bilateral   ? TUMOR REMOVAL    ? neck, ?lipoma  ? ?Family Status  ?Relation Name Status  ? Mother  Alive  ?     PAD, DM, ?CAD  ? Father  Deceased  ?     CAD  ? Sister  Alive  ? G Son  Alive  ? Neg Hx  (Not Specified)  ? ?Family History  ?Problem Relation Age of Onset  ? Stroke Mother   ? Diabetes Mother   ? Fibrocystic breast disease Mother   ? CAD Father   ? Alcoholism Father   ? Fibrocystic breast disease Sister   ? Diabetes Mellitus I Grandson   ? Breast cancer Neg Hx   ? Colon cancer Neg Hx   ? ?Social History  ? ?Socioeconomic History  ? Marital status: Divorced  ?  Spouse name: Not on file  ? Number of children: 2  ? Years of education: Not on file  ? Highest education level: Not on file  ?Occupational History  ? Occupation: retired  ?Tobacco Use  ? Smoking status: Former  ?  Packs/day: 0.25  ?  Years: 40.00  ?  Pack years: 10.00  ?  Types: Cigarettes  ?  Quit date: 2015  ?  Years since quitting: 8.2  ? Smokeless tobacco: Never  ? Tobacco comments:  ?  Used to smoke heavily, cut back in 2015  ?Vaping Use  ? Vaping Use: Never used   ?Substance and Sexual Activity  ? Alcohol use: Yes  ?  Comment: socially, rare intake  ? Drug use: No  ? Sexual activity: Yes  ?  Partners: Male  ?  Birth control/protection: Post-menopausal  ?Other Topics Concern  ? Not on file  ?Social History Narrative  ? Lives alone.  ? ?Social Determinants of Health  ? ?Financial Resource Strain: Not on file  ?Food Insecurity: Not on file  ?Transportation Needs: Not on file  ?Physical Activity: Not on file  ?Stress: Not on file  ?Social Connections: Not on file  ? ?Outpatient Medications Prior to Visit  ?Medication Sig  ? acetaminophen (TYLENOL) 325 MG tablet Take by mouth. 4 tablets as needed  ? atorvastatin (LIPITOR) 40 MG tablet SMARTSIG:1 Tablet(s) By Mouth Every Evening  ? hydrocortisone 2.5 % cream Apply 1 application topically as needed.  ? metoprolol succinate (TOPROL-XL) 25 MG 24 hr tablet TAKE 1 TABLET BY MOUTH  DAILY  ? MULTAQ 400 MG tablet TAKE 1 TABLET BY MOUTH  TWICE DAILY WITH A MEAL  ? [DISCONTINUED] methocarbamol (ROBAXIN) 500 MG tablet Take by mouth as needed.  ? [DISCONTINUED] rivaroxaban (XARELTO) 20 MG TABS tablet Take 20 mg by mouth daily with supper.  ? ?No facility-administered medications prior to visit.  ? ?No Known Allergies ? ? ?There is no immunization history on file for this patient. ? ?Health Maintenance  ?Topic Date Due  ? COVID-19 Vaccine (1) Never done  ? Hepatitis C Screening  Never done  ? TETANUS/TDAP  Never done  ? COLONOSCOPY (Pts 45-70yr Insurance coverage will need to be confirmed)  Never done  ? MAMMOGRAM  Never done  ? Zoster Vaccines- Shingrix (1 of 2) Never done  ? Pneumonia Vaccine 71 Years old (1 - PCV) Never done  ? INFLUENZA VACCINE  12/04/2021  ? DEXA SCAN  Completed  ? HPV VACCINES  Aged Out  ? ? ?Patient Care Team: ?BVirginia Crews MD as PCP - General (Family Medicine) ?AWellington Hampshire MD as PCP - Cardiology (Cardiology) ?LVickie Epley MD as PCP - Electrophysiology (Cardiology) ?SVladimir Crofts MD as  Consulting Physician (Neurology) ? ?Review of Systems ? ?Last CBC ?Lab Results  ?Component Value Date  ? WBC 11.9 07/24/2021  ?  HGB 13.3 07/24/2021  ? HCT 41 07/24/2021  ? MCV 89.8 03/01/2020  ? MCH 29.2 03/01/2020  ? RDW 13.1 03/01/2020  ? PLT 210 07/24/2021  ? ?Last metabolic panel ?Lab Results  ?Component Value Date  ? GLUCOSE 98 03/01/2020  ? NA 140 07/24/2021  ? K 3.9 07/24/2021  ? CL 105 07/24/2021  ? CO2 28 (A) 07/24/2021  ? BUN 11 07/24/2021  ? CREATININE 1.1 07/24/2021  ? GFRNONAA 53 (L) 03/01/2020  ? CALCIUM 9.3 07/24/2021  ? ALBUMIN 3.8 07/24/2021  ? ALKPHOS 97 07/24/2021  ? AST 25 07/24/2021  ? ALT 21 07/24/2021  ? ANIONGAP 11 03/01/2020  ? ?Last lipids ?Lab Results  ?Component Value Date  ? CHOL 210 (H) 03/02/2020  ? HDL 41 03/02/2020  ? LDLCALC 151 (H) 03/02/2020  ? TRIG 91 03/02/2020  ? CHOLHDL 5.1 03/02/2020  ? ?Last hemoglobin A1c ?Lab Results  ?Component Value Date  ? HGBA1C 5.7 (H) 03/02/2020  ? ?Last thyroid functions ?Lab Results  ?Component Value Date  ? TSH 2.563 03/02/2020  ? ?  ? ? Objective  ?  ?BP 96/60 (BP Location: Left Arm, Patient Position: Sitting, Cuff Size: Large)   Pulse 60   Temp 98.1 ?F (36.7 ?C) (Oral)   Resp 16   Ht '5\' 5"'$  (1.651 m)   Wt 224 lb 12.8 oz (102 kg)   SpO2 96%   BMI 37.41 kg/m?  ?BP Readings from Last 3 Encounters:  ?08/16/21 96/60  ?06/20/21 (!) 148/82  ?12/13/20 140/80  ? ?Wt Readings from Last 3 Encounters:  ?08/16/21 224 lb 12.8 oz (102 kg)  ?06/20/21 227 lb (103 kg)  ?12/13/20 229 lb (103.9 kg)  ? ?  ? ?Physical Exam ?Vitals reviewed.  ?Constitutional:   ?   General: She is not in acute distress. ?   Appearance: Normal appearance. She is well-developed. She is not diaphoretic.  ?HENT:  ?   Head: Normocephalic and atraumatic.  ?   Right Ear: Tympanic membrane, ear canal and external ear normal.  ?   Left Ear: Tympanic membrane, ear canal and external ear normal.  ?   Nose: Nose normal.  ?   Mouth/Throat:  ?   Mouth: Mucous membranes are moist.  ?    Pharynx: Oropharynx is clear. No oropharyngeal exudate.  ?Eyes:  ?   General: No scleral icterus. ?   Conjunctiva/sclera: Conjunctivae normal.  ?   Pupils: Pupils are equal, round, and reactive to light.  ?Neck:  ?   Thyroi

## 2021-08-16 ENCOUNTER — Ambulatory Visit (INDEPENDENT_AMBULATORY_CARE_PROVIDER_SITE_OTHER): Payer: Medicare Other | Admitting: Family Medicine

## 2021-08-16 ENCOUNTER — Encounter: Payer: Self-pay | Admitting: Family Medicine

## 2021-08-16 VITALS — BP 96/60 | HR 60 | Temp 98.1°F | Resp 16 | Ht 65.0 in | Wt 224.8 lb

## 2021-08-16 DIAGNOSIS — F331 Major depressive disorder, recurrent, moderate: Secondary | ICD-10-CM | POA: Diagnosis not present

## 2021-08-16 DIAGNOSIS — I10 Essential (primary) hypertension: Secondary | ICD-10-CM | POA: Diagnosis not present

## 2021-08-16 DIAGNOSIS — Z7901 Long term (current) use of anticoagulants: Secondary | ICD-10-CM

## 2021-08-16 DIAGNOSIS — Z1231 Encounter for screening mammogram for malignant neoplasm of breast: Secondary | ICD-10-CM

## 2021-08-16 DIAGNOSIS — G4733 Obstructive sleep apnea (adult) (pediatric): Secondary | ICD-10-CM

## 2021-08-16 DIAGNOSIS — Z1211 Encounter for screening for malignant neoplasm of colon: Secondary | ICD-10-CM

## 2021-08-16 DIAGNOSIS — E785 Hyperlipidemia, unspecified: Secondary | ICD-10-CM | POA: Insufficient documentation

## 2021-08-16 DIAGNOSIS — I480 Paroxysmal atrial fibrillation: Secondary | ICD-10-CM | POA: Diagnosis not present

## 2021-08-16 DIAGNOSIS — Z8673 Personal history of transient ischemic attack (TIA), and cerebral infarction without residual deficits: Secondary | ICD-10-CM

## 2021-08-16 DIAGNOSIS — J4520 Mild intermittent asthma, uncomplicated: Secondary | ICD-10-CM

## 2021-08-16 DIAGNOSIS — E782 Mixed hyperlipidemia: Secondary | ICD-10-CM

## 2021-08-16 DIAGNOSIS — R7303 Prediabetes: Secondary | ICD-10-CM

## 2021-08-16 DIAGNOSIS — N1831 Chronic kidney disease, stage 3a: Secondary | ICD-10-CM | POA: Insufficient documentation

## 2021-08-16 DIAGNOSIS — F411 Generalized anxiety disorder: Secondary | ICD-10-CM | POA: Diagnosis not present

## 2021-08-16 DIAGNOSIS — Z1159 Encounter for screening for other viral diseases: Secondary | ICD-10-CM

## 2021-08-16 MED ORDER — SERTRALINE HCL 50 MG PO TABS: 50.0000 mg | ORAL_TABLET | Freq: Every day | ORAL | 1 refills | Status: DC

## 2021-08-16 MED ORDER — RIVAROXABAN 20 MG PO TABS: 20.0000 mg | ORAL_TABLET | Freq: Every day | ORAL | 1 refills | Status: DC

## 2021-08-16 MED ORDER — METHOCARBAMOL 500 MG PO TABS: 500.0000 mg | ORAL_TABLET | Freq: Three times a day (TID) | ORAL | 2 refills | Status: DC | PRN

## 2021-08-16 MED ORDER — ALBUTEROL SULFATE HFA 108 (90 BASE) MCG/ACT IN AERS: 2.0000 | INHALATION_SPRAY | Freq: Four times a day (QID) | RESPIRATORY_TRACT | 0 refills | Status: DC | PRN

## 2021-08-16 NOTE — Assessment & Plan Note (Signed)
F/b neuro ?Continue CPAP use ?

## 2021-08-16 NOTE — Assessment & Plan Note (Signed)
Longstanding, uncontrolled ?Will Start zoloft 50 mg daily ?Discussed potential side effects, incl GI upset, sexual dysfunction, and SI ?Discussed that it can take 6-8 weeks to reach full efficacy ?Contracted for safety - no SI/HI ?Discussed synergistic effects of medications and therapy  ?

## 2021-08-16 NOTE — Assessment & Plan Note (Signed)
Well controlled ?Continue current medications ?Reviewed last metabolic panel - will abstract ? ?

## 2021-08-16 NOTE — Assessment & Plan Note (Signed)
F/b neuro ?Continue statin, xarelto ?Goal LDL <70 as above ?No remaining deficits ?

## 2021-08-16 NOTE — Assessment & Plan Note (Signed)
F/b cardiology ?On rate and rhythm control ?In NSR today ?Continue xarelto - reviewed CBC ?

## 2021-08-16 NOTE — Assessment & Plan Note (Signed)
Chronic and stable Recheck metabolic panel Avoid nephrotoxic meds  

## 2021-08-16 NOTE — Assessment & Plan Note (Signed)
Reviewed recent CBC with normal H/H ?Continue Xarelto given a fib ?

## 2021-08-16 NOTE — Assessment & Plan Note (Signed)
Recommend low carb diet °Recheck A1c  °

## 2021-08-16 NOTE — Assessment & Plan Note (Signed)
Chronic and uncontrolled ?Start zoloft as above ?Discussed avoiding benzos given risk of confusion and falls ?

## 2021-08-16 NOTE — Assessment & Plan Note (Signed)
Chronic and well controlled ?Uncomplicated  ?Continue prn albuterol - new Rx written today ?

## 2021-08-16 NOTE — Assessment & Plan Note (Signed)
Previously well controlled Continue statin Repeat FLP and CMP Goal LDL < 70 

## 2021-08-17 ENCOUNTER — Other Ambulatory Visit: Payer: Self-pay

## 2021-08-17 LAB — LIPID PANEL
Chol/HDL Ratio: 3 ratio (ref 0.0–4.4)
Cholesterol, Total: 116 mg/dL (ref 100–199)
HDL: 39 mg/dL — ABNORMAL LOW (ref >39)
LDL Chol Calc (NIH): 61 mg/dL (ref 0–99)
Triglycerides: 82 mg/dL (ref 0–149)
VLDL Cholesterol Cal: 16 mg/dL (ref 5–40)

## 2021-08-17 LAB — HEMOGLOBIN A1C
Est. average glucose Bld gHb Est-mCnc: 123 mg/dL
Hgb A1c MFr Bld: 5.9 % — ABNORMAL HIGH (ref 4.8–5.6)

## 2021-08-17 LAB — HEPATITIS C ANTIBODY: Hep C Virus Ab: NONREACTIVE

## 2021-08-17 MED ORDER — ALBUTEROL SULFATE HFA 108 (90 BASE) MCG/ACT IN AERS: 2.0000 | INHALATION_SPRAY | Freq: Four times a day (QID) | RESPIRATORY_TRACT | 3 refills | Status: DC | PRN

## 2021-08-20 ENCOUNTER — Telehealth: Payer: Self-pay | Admitting: Family Medicine

## 2021-08-20 NOTE — Telephone Encounter (Signed)
[facility] calling to to say the albuterol (VENTOLIN HFA) 108 (90 Base) MCG/ACT inhaler ?But ?Proventil inhaler is covered. ?Would like to know if you can send in new Rx? ? ?[facility] ([place], [place] ?

## 2021-08-22 ENCOUNTER — Telehealth: Payer: Self-pay

## 2021-08-22 NOTE — Telephone Encounter (Signed)
Copied from [place] [id number]. Topic: Medical Record Request - Other ?>> Aug 22, 2021 12:48 PM Oneta Rack wrote: ?Caller received a medical release form requesting Immunization records, caller states Nurse can access [employer] base. Caller unclear if PCP is requesting the vaccines given by [facility]. Caller seeking clarity and would like a follow up call ?

## 2021-08-22 NOTE — Telephone Encounter (Signed)
Copied from [place] [id number]. Topic: Medical Record Request - Other ?>> Aug 22, 2021 12:48 PM Oneta Rack wrote: ?Caller received a medical release form requesting Immunization records, caller states Nurse can access [employer] base. Caller unclear if PCP is requesting the vaccines given by [facility]. Caller seeking clarity and would like a follow up call ?

## 2021-08-22 NOTE — Telephone Encounter (Signed)
Pt given lab results per notes of Dr. Brita Romp on 08/22/21. Pt verbalized understanding.  ?

## 2021-08-22 NOTE — Telephone Encounter (Signed)
Called [facility] R[facility]o change prescription.  ?

## 2021-08-22 NOTE — Telephone Encounter (Signed)
Duplicate

## 2021-08-22 NOTE — Telephone Encounter (Signed)
Kalman Shan as she requested.  ?

## 2021-08-22 NOTE — Telephone Encounter (Signed)
[facility] reviewed and record updated.  ?

## 2021-09-06 ENCOUNTER — Ambulatory Visit (INDEPENDENT_AMBULATORY_CARE_PROVIDER_SITE_OTHER): Payer: Medicare Other | Admitting: Physician Assistant

## 2021-09-06 ENCOUNTER — Encounter: Payer: Self-pay | Admitting: Physician Assistant

## 2021-09-06 VITALS — BP 122/70 | HR 56 | Temp 98.5°F | Resp 16 | Wt 220.4 lb

## 2021-09-06 DIAGNOSIS — K529 Noninfective gastroenteritis and colitis, unspecified: Secondary | ICD-10-CM | POA: Insufficient documentation

## 2021-09-06 NOTE — Progress Notes (Signed)
?  ? ?I,Joseline E Rosas,acting as a scribe for [employer], PA-C.,have documented all relevant documentation on the behalf of [place], PA-C,as directed by  [employer], PA-C while in the presence of Samay Delcarlo E Alexea Blase, PA-C.  ? ?Established patient visit ? ? ?Patient: Tina Peters   DOB: [birth year]   71 y.o. Female  MRN: [id number] ?Visit Date: 09/06/2021 ? ?Today's healthcare provider: Dani Gobble Cassiel Fernandez, PA-C  ?Introduced myself to the patient as a Journalist, newspaper and provided education on APPs in clinical practice.  ? ? ?Chief Complaint  ?Patient presents with  ? Diarrhea  ? ?Subjective  ?  ?Diarrhea  ?This is a new problem. The current episode started more than 1 month ago (For four month). The problem has been unchanged (OFF AND ON). The stool consistency is described as Watery. The patient states that diarrhea awakens her from sleep. Associated symptoms include abdominal pain (sometimes), bloating, a fever (last night 100.1 in the afternoon), increased flatus and sweats. Pertinent negatives include no chills, coughing or vomiting. There are no known risk factors. She has tried nothing for the symptoms.   ? ? ?States she had COVID in [place]  ?States she has had diarrhea intermittently since Jan ?Reports it depends on what she eats and drinks  ?Reports her diarrhea is watery and brown in coloration - on average about 2-3 times per day, reports very bad smell as well  ? ?States she is trying to cut out sugar or decrease intake of sugar  ?States she has had some solid stools with increased fiber intake ( had collards, beans, cornbread and this caused a more solid stool)  ?Reports some improvement after eating brussel sprouts as well ? ?Reports she is drinking water and tea to try to stay hydrated ?Has been avoiding coffee ? ?States she has noticed decreased appetite  ?Reports intermittent temp elevations that last about an hour ?She denies fecal incontinence, ?She has had abx in March from [facility]  ? ?She had not tried  taking Imodium, Benefiber, or Metamucil  ? ? ? ?Medications: ?Outpatient Medications Prior to Visit  ?Medication Sig  ? acetaminophen (TYLENOL) 325 MG tablet Take by mouth. 4 tablets as needed  ? albuterol (VENTOLIN HFA) 108 (90 Base) MCG/ACT inhaler Inhale 2 puffs into the lungs every 6 (six) hours as needed for wheezing or shortness of breath.  ? atorvastatin (LIPITOR) 40 MG tablet SMARTSIG:1 Tablet(s) By Mouth Every Evening  ? hydrocortisone 2.5 % cream Apply 1 application topically as needed.  ? methocarbamol (ROBAXIN) 500 MG tablet Take 1 tablet (500 mg total) by mouth every 8 (eight) hours as needed.  ? metoprolol succinate (TOPROL-XL) 25 MG 24 hr tablet TAKE 1 TABLET BY MOUTH  DAILY  ? MULTAQ 400 MG tablet TAKE 1 TABLET BY MOUTH  TWICE DAILY WITH A MEAL  ? rivaroxaban (XARELTO) 20 MG TABS tablet Take 1 tablet (20 mg total) by mouth daily with supper.  ? sertraline (ZOLOFT) 50 MG tablet Take 1 tablet (50 mg total) by mouth daily.  ? ?No facility-administered medications prior to visit.  ? ? ?Review of Systems  ?Constitutional:  Positive for appetite change, fatigue and fever (last night 100.1 in the afternoon). Negative for chills and diaphoresis.  ?Respiratory:  Negative for cough.   ?Gastrointestinal:  Positive for abdominal distention, abdominal pain (sometimes), bloating, diarrhea, flatus and nausea. Negative for anal bleeding, blood in stool and vomiting.  ? ? ?  Objective  ?  ?BP  122/70 (BP Location: Left Arm, Patient Position: Sitting, Cuff Size: Large)   Pulse (!) 56   Temp 98.5 ?F (36.9 ?C) (Oral)   Resp 16   Wt 220 lb 6.4 oz (100 kg)   SpO2 97%   BMI 36.68 kg/m?  ? ? ?Physical Exam ?Vitals reviewed.  ?Constitutional:   ?   General: She is awake.  ?   Appearance: Normal appearance. She is well-developed and well-groomed. She is obese.  ?HENT:  ?   Head: Normocephalic and atraumatic.  ?Cardiovascular:  ?   Rate and Rhythm: Rhythm irregularly irregular.  ?   Pulses:     ?     Radial pulses are 1+  on the right side and 1+ on the left side.  ?   Heart sounds: Normal heart sounds.  ?Pulmonary:  ?   Effort: Pulmonary effort is normal.  ?   Breath sounds: Normal breath sounds and air entry. No decreased breath sounds, wheezing, rhonchi or rales.  ?Abdominal:  ?   General: Abdomen is protuberant. Bowel sounds are normal.  ?   Palpations: Abdomen is soft.  ?   Tenderness: There is generalized abdominal tenderness.  ?Neurological:  ?   Mental Status: She is alert.  ?Psychiatric:     ?   Attention and Perception: Attention and perception normal.     ?   Mood and Affect: Mood and affect normal.     ?   Speech: Speech normal.     ?   Behavior: Behavior normal. Behavior is cooperative.  ?  ? ? ?No results found for any visits on 09/06/21. ? Assessment & Plan  ?  ? ?Problem List Items Addressed This Visit   ? ?  ? Digestive  ? Chronic diarrhea of unknown origin - Primary  ?  Chronic for the last 4 months, newly reported ?Pt states she has had diarrhea since she had COVID in Jan 2023 ?Reports very limited solid stools - predominantly watery, brown with noticeable bad odor ?Patient has had multiple rounds of abx since Jan - mild suspicion for C. Dif  ?Will check CMP for dehydration/ electrolyte imbalance, ESR, CRP for inflammatory etiology  along with C. Dif and GI panel for potential infectious causes  ?Results to dictate further management ?While waiting for result recommend she keep symptom journal ?Discussed benefits of increasing fiber content in diet along with bland diet for potential GI relief ?Recommend increased water intake (at least 75 oz per day)   ?If symptoms continue - may need to refer to GI for further evaluation.  ?Cologuard has been ordered previously ?Recommend follow up in 6 weeks to discuss progress and re-evaluate response to management strategies.  ? ? ?  ?  ? Relevant Orders  ? Sed Rate (ESR)  ? C-reactive protein  ? Comprehensive Metabolic Panel (CMET)  ? Clostridium Difficile by PCR  ? GI  Profile, Stool, PCR  ? ? ? ?Return in about 6 weeks (around 10/18/2021) for diarrhea. ? ? ?I, Markeita Alicia E Janazia Schreier, PA-C, have reviewed all documentation for this visit. The documentation on 09/06/21 for the exam, diagnosis, procedures, and orders are all accurate and complete. ? ? ?Canda Podgorski, MHS, PA-C ?[facility] ?[facility]  ? ? ? ?

## 2021-09-06 NOTE — Assessment & Plan Note (Signed)
Chronic for the last 4 months, newly reported ?Pt states she has had diarrhea since she had COVID in Jan 2023 ?Reports very limited solid stools - predominantly watery, brown with noticeable bad odor ?Patient has had multiple rounds of abx since Jan - mild suspicion for C. Dif  ?Will check CMP for dehydration/ electrolyte imbalance, ESR, CRP for inflammatory etiology  along with C. Dif and GI panel for potential infectious causes  ?Results to dictate further management ?While waiting for result recommend she keep symptom journal ?Discussed benefits of increasing fiber content in diet along with bland diet for potential GI relief ?Recommend increased water intake (at least 75 oz per day)   ?If symptoms continue - may need to refer to GI for further evaluation.  ?Cologuard has been ordered previously ?Recommend follow up in 6 weeks to discuss progress and re-evaluate response to management strategies.  ? ?

## 2021-09-06 NOTE — Patient Instructions (Addendum)
I recommend the following: ? ?Keeping  a symptom diary  ?This would include symptoms, timing of symptoms, food and drink consumed and when,  ?You can try to start a bland diet to help reduce GI irritation  ? ?After keeping the diary for about 2-3 weeks I would like you to start increasing your fiber content and trying to reduce any foods that seem to aggravate or cause your diarrhea to become worse ? ?Fiber supplements would be Benefiber, Metamucil along with fiber-rich foods. Try to increase fiber intake gradually (5 grams per day for one week then increase by 5 grams each subsequent week)  ? ?Stay well hydrated with plenty of water (at least 75 oz per day)  ? ?We will keep you updated on the results of your lab work as it is made available ? ? ? ? ? ? ? ? ?

## 2021-09-07 ENCOUNTER — Telehealth: Payer: Self-pay | Admitting: Family Medicine

## 2021-09-07 LAB — COMPREHENSIVE METABOLIC PANEL
ALT: 16 IU/L (ref 0–32)
AST: 27 IU/L (ref 0–40)
Albumin/Globulin Ratio: 1.1 — ABNORMAL LOW (ref 1.2–2.2)
Albumin: 3.7 g/dL — ABNORMAL LOW (ref 3.8–4.8)
Alkaline Phosphatase: 107 IU/L (ref 44–121)
BUN/Creatinine Ratio: 8 — ABNORMAL LOW (ref 12–28)
BUN: 9 mg/dL (ref 8–27)
Bilirubin Total: 0.4 mg/dL (ref 0.0–1.2)
CO2: 22 mmol/L (ref 20–29)
Calcium: 8.9 mg/dL (ref 8.7–10.3)
Chloride: 101 mmol/L (ref 96–106)
Creatinine, Ser: 1.14 mg/dL — ABNORMAL HIGH (ref 0.57–1.00)
Globulin, Total: 3.4 g/dL (ref 1.5–4.5)
Glucose: 128 mg/dL — ABNORMAL HIGH (ref 70–99)
Potassium: 3.5 mmol/L (ref 3.5–5.2)
Sodium: 138 mmol/L (ref 134–144)
Total Protein: 7.1 g/dL (ref 6.0–8.5)
eGFR: 52 mL/min/{1.73_m2} — ABNORMAL LOW (ref >59)

## 2021-09-07 LAB — C-REACTIVE PROTEIN: CRP: 71 mg/L — ABNORMAL HIGH (ref 0–10)

## 2021-09-07 LAB — SEDIMENTATION RATE: Sed Rate: 31 mm/hr (ref 0–40)

## 2021-09-10 ENCOUNTER — Emergency Department
Admission: EM | Admit: 2021-09-10 | Discharge: 2021-09-10 | Disposition: A | Discharge status: Home / Self Care | Payer: Medicare Other | Attending: Emergency Medicine | Admitting: Emergency Medicine

## 2021-09-10 ENCOUNTER — Other Ambulatory Visit: Payer: Self-pay

## 2021-09-10 ENCOUNTER — Emergency Department: Payer: Medicare Other

## 2021-09-10 DIAGNOSIS — M7989 Other specified soft tissue disorders: Secondary | ICD-10-CM | POA: Diagnosis not present

## 2021-09-10 DIAGNOSIS — D72829 Elevated white blood cell count, unspecified: Secondary | ICD-10-CM | POA: Insufficient documentation

## 2021-09-10 DIAGNOSIS — N189 Chronic kidney disease, unspecified: Secondary | ICD-10-CM | POA: Diagnosis not present

## 2021-09-10 DIAGNOSIS — M1711 Unilateral primary osteoarthritis, right knee: Secondary | ICD-10-CM | POA: Diagnosis not present

## 2021-09-10 DIAGNOSIS — Z743 Need for continuous supervision: Secondary | ICD-10-CM | POA: Diagnosis not present

## 2021-09-10 DIAGNOSIS — M25561 Pain in right knee: Secondary | ICD-10-CM

## 2021-09-10 DIAGNOSIS — Z7901 Long term (current) use of anticoagulants: Secondary | ICD-10-CM | POA: Diagnosis not present

## 2021-09-10 DIAGNOSIS — M25461 Effusion, right knee: Secondary | ICD-10-CM

## 2021-09-10 DIAGNOSIS — R509 Fever, unspecified: Secondary | ICD-10-CM | POA: Diagnosis not present

## 2021-09-10 DIAGNOSIS — R609 Edema, unspecified: Secondary | ICD-10-CM | POA: Diagnosis not present

## 2021-09-10 LAB — SYNOVIAL FLUID, CRYSTAL: Crystals, Fluid: NONE SEEN

## 2021-09-10 LAB — CBC
HCT: 39.1 % (ref 36.0–46.0)
Hemoglobin: 12.3 g/dL (ref 12.0–15.0)
MCH: 27.6 pg (ref 26.0–34.0)
MCHC: 31.5 g/dL (ref 30.0–36.0)
MCV: 87.7 fL (ref 80.0–100.0)
Platelets: 232 10*3/uL (ref 150–400)
RBC: 4.46 MIL/uL (ref 3.87–5.11)
RDW: 12.7 % (ref 11.5–15.5)
WBC: 15.3 10*3/uL — ABNORMAL HIGH (ref 4.0–10.5)
nRBC: 0 % (ref 0.0–0.2)

## 2021-09-10 LAB — BASIC METABOLIC PANEL
Anion gap: 10 (ref 5–15)
BUN: 11 mg/dL (ref 8–23)
CO2: 22 mmol/L (ref 22–32)
Calcium: 8.8 mg/dL — ABNORMAL LOW (ref 8.9–10.3)
Chloride: 104 mmol/L (ref 98–111)
Creatinine, Ser: 1.01 mg/dL — ABNORMAL HIGH (ref 0.44–1.00)
GFR, Estimated: 60 mL/min — ABNORMAL LOW (ref >60)
Glucose, Bld: 159 mg/dL — ABNORMAL HIGH (ref 70–99)
Potassium: 3.3 mmol/L — ABNORMAL LOW (ref 3.5–5.1)
Sodium: 136 mmol/L (ref 135–145)

## 2021-09-10 LAB — BODY FLUID CELL COUNT WITH DIFFERENTIAL
Eos, Fluid: 0 %
Lymphs, Fluid: 3 %
Monocyte-Macrophage-Serous Fluid: 4 %
Neutrophil Count, Fluid: 93 %
Total Nucleated Cell Count, Fluid: 16938 cu mm

## 2021-09-10 MED ORDER — LIDOCAINE HCL (PF) 1 % IJ SOLN
5.0000 mL | Freq: Once | INTRAMUSCULAR | Status: AC
  Administered 2021-09-10: 5 mL
  Filled 2021-09-10: qty 5

## 2021-09-10 MED ORDER — OXYCODONE HCL 5 MG PO TABS
5.0000 mg | ORAL_TABLET | Freq: Three times a day (TID) | ORAL | 0 refills | Status: AC | PRN
Start: 2021-09-10 — End: 2021-09-13

## 2021-09-10 MED ORDER — FENTANYL CITRATE PF 50 MCG/ML IJ SOSY
100.0000 ug | PREFILLED_SYRINGE | Freq: Once | INTRAMUSCULAR | Status: DC
  Filled 2021-09-10: qty 2

## 2021-09-10 MED ORDER — FENTANYL CITRATE PF 50 MCG/ML IJ SOSY
75.0000 ug | PREFILLED_SYRINGE | Freq: Once | INTRAMUSCULAR | Status: AC
  Administered 2021-09-10: 75 ug via INTRAVENOUS

## 2021-09-10 NOTE — Discharge Instructions (Addendum)
Please follow-up with orthopedics on Wednesday for repeat evaluation.  I have written you for oxycodone which you can take as needed for breakthrough pain. ?

## 2021-09-10 NOTE — Consult Note (Signed)
ORTHOPAEDIC CONSULTATION ? ?REQUESTING PHYSICIAN: Rada Hay, MD ? ?Chief Complaint:   ?Right knee pain. ? ?History of Present Illness: ?Tina Peters is a 71 y.o. female with multiple medical problems including hypertension, hyperlipidemia, diverticulitis, anxiety/depression, asthma, obesity, sleep apnea, paroxysmal atrial fibrillation requiring anticoagulation with Xarelto, and paroxysmal supraventricular tachycardia who lives independently.  The patient apparently has been dealing with chronic recurrent diarrhea and low-grade fevers ever since she underwent heavy antibiotic treatment for COVID in early January.  About 4 days ago, she developed increased pain and swelling in her right knee and leg with difficulty ambulating.  She did not seek treatment immediately and notes that her leg swelling has improved, especially over the past 48 hours.  However, she continued to have difficulty with ambulation and therefore was advised by her daughter to come to the emergency room.   ? ?In the emergency room, the patient's temperature is 99.6 and her white count is 15.3.  The right knee was aspirated by the ER provider.  These results demonstrate less than 17,000 white cells which are 93% neutrophils.  No crystals were identified.  The patient notes that her knee is somewhat improved following the aspiration. ? ?Past Medical History:  ?Diagnosis Date  ? Allergy   ? Anxiety   ? Arthritis   ? Asthma   ? Chronic back pain   ? Depression   ? Diastolic dysfunction   ? a. 02/2020 Echo: EF 60-65%, no rwma. Gr2 DD. Nl RV size/fxn. Mild LAE. Mild MR.  ? Diverticulitis   ? Hyperlipidemia   ? Hypertension   ? Lipoma   ? Mild mitral regurgitation   ? a. 08/2016 Echo: mild MR; b. 02/2020 Echo: Mild MR.  ? Neuromuscular disorder ([place])   ? Obesity   ? Obstructive sleep apnea   ? PAF (paroxysmal atrial fibrillation) ([place])   ? a. Dx 05/2016--> Xarelto (CHA2DS2VASc =  3); b. 08/2016 Echo: EF 55-60%, no rwma, mild MR, mod dil LA; c. 03/2020 Zio: Avg HR 61 w/ Afib burden of 24% - max rate 189 (avg 93). 2 pauses - longest 4.7 secs post-conversion pauses (occurred @ 5:42 am).  ? Paroxysmal SVT (supraventricular tachycardia) ([place])   ? Sleep apnea   ? Stroke [facility])   ? a. 02/2020 MRI/A: multifocal acute ischemia in R MCA territory predominantly involving post insula and R middle frontal gyrus. Nl MRA.  ? Tachy-brady syndrome ([place])   ? Tobacco abuse   ? ?Past Surgical History:  ?Procedure Laterality Date  ? ACHILLES TENDON SURGERY Left   ? BACK SURGERY    ? 2 ruptured discs in L spine, no hardware  ? KNEE ARTHROSCOPY W/ MENISCAL REPAIR Right   ? in the 1990s  ? SALPINGOOPHORECTOMY Bilateral   ? TUMOR REMOVAL    ? neck, ?lipoma  ? ?Social History  ? ?Socioeconomic History  ? Marital status: Divorced  ?  Spouse name: Not on file  ? Number of children: 2  ? Years of education: Not on file  ? Highest education level: Not on file  ?Occupational History  ? Occupation: retired  ?Tobacco Use  ? Smoking status: Former  ?  Packs/day: 0.25  ?  Years: 40.00  ?  Pack years: 10.00  ?  Types: Cigarettes  ?  Quit date: 2015  ?  Years since quitting: 8.3  ? Smokeless tobacco: Never  ? Tobacco comments:  ?  Used to smoke heavily, cut back in 2015  ?Vaping Use  ?  Vaping Use: Never used  ?Substance and Sexual Activity  ? Alcohol use: Yes  ?  Comment: socially, rare intake  ? Drug use: No  ? Sexual activity: Yes  ?  Partners: Male  ?  Birth control/protection: Post-menopausal  ?Other Topics Concern  ? Not on file  ?Social History Narrative  ? Lives alone.  ? ?Social Determinants of Health  ? ?Financial Resource Strain: Not on file  ?Food Insecurity: Not on file  ?Transportation Needs: Not on file  ?Physical Activity: Not on file  ?Stress: Not on file  ?Social Connections: Not on file  ? ?Family History  ?Problem Relation Age of Onset  ? Stroke Mother   ? Diabetes Mother   ? Fibrocystic breast disease Mother    ? CAD Father   ? Alcoholism Father   ? Fibrocystic breast disease Sister   ? Diabetes Mellitus I Grandson   ? Breast cancer Neg Hx   ? Colon cancer Neg Hx   ? ?No Known Allergies ?Prior to Admission medications   ?Medication Sig Start Date End Date Taking? Authorizing Provider  ?oxyCODONE (ROXICODONE) 5 MG immediate release tablet Take 1 tablet (5 mg total) by mouth every 8 (eight) hours as needed for up to 3 days. 09/10/21 09/13/21 Yes Rada Hay, MD  ?acetaminophen (TYLENOL) 325 MG tablet Take by mouth. 4 tablets as needed    [provider]  ?albuterol (VENTOLIN HFA) 108 (90 Base) MCG/ACT inhaler Inhale 2 puffs into the lungs every 6 (six) hours as needed for wheezing or shortness of breath. 08/17/21   Virginia Crews, MD  ?atorvastatin (LIPITOR) 40 MG tablet SMARTSIG:1 Tablet(s) By Mouth Every Evening 04/26/20   [provider]  ?hydrocortisone 2.5 % cream Apply 1 application topically as needed. 03/10/20   [provider]  ?methocarbamol (ROBAXIN) 500 MG tablet Take 1 tablet (500 mg total) by mouth every 8 (eight) hours as needed. 08/16/21   Virginia Crews, MD  ?metoprolol succinate (TOPROL-XL) 25 MG 24 hr tablet TAKE 1 TABLET BY MOUTH  DAILY 11/13/20   Vickie Epley, MD  ?MULTAQ 400 MG tablet TAKE 1 TABLET BY MOUTH  TWICE DAILY WITH A MEAL 06/18/21   Vickie Epley, MD  ?rivaroxaban (XARELTO) 20 MG TABS tablet Take 1 tablet (20 mg total) by mouth daily with supper. 08/16/21   Virginia Crews, MD  ?sertraline (ZOLOFT) 50 MG tablet Take 1 tablet (50 mg total) by mouth daily. 08/16/21   Virginia Crews, MD  ? ?DG Knee Complete 4 Views Right ? ?Result Date: 09/10/2021 ?CLINICAL DATA:  Right knee swelling EXAM: RIGHT KNEE - COMPLETE 4+ VIEW COMPARISON:  None Available. FINDINGS: No evidence of acute fracture. Moderate-severe tricompartmental osteoarthritis with joint space loss and bulky marginal osteophyte formation. Moderate size knee joint effusion. Hardware  related to prior ACL surgery. No focal soft tissue swelling is evident. 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 Electronically Signed   By: Davina Poke D.O.   On: 09/10/2021 13:40   ? ?Positive ROS: All other systems have been reviewed and were otherwise negative with the exception of those mentioned in the HPI and as above. ? ?Physical Exam: ?General:  Alert, no acute distress ?Psychiatric:  Patient is competent for consent with normal mood and affect   ?Cardiovascular:  No pedal edema ?Respiratory:  No wheezing, non-labored breathing ?GI:  Abdomen is soft and non-tender ?Skin:  No lesions in the area  of chief complaint ?Neurologic:  Sensation intact distally ?Lymphatic:  No axillary or cervical lymphadenopathy ? ?Orthopedic Exam:  ?Orthopedic examination is limited to the right knee and lower extremity.  Skin inspection around the right knee is notable for mild swelling and warmth, but there is no erythema, ecchymosis, abrasions, or other skin abnormalities identified.  She has minimal tenderness to palpation of the medial and lateral aspects of the knee, but she has no peripatellar tenderness.  There is a small effusion.  Actively, she has some difficulty performing a straight leg raise, but is able to actively flex and extend her knee from 15 to 80 degrees.  There is mild patellofemoral crepitance.  The knee is stable to varus and valgus stressing.  She is grossly neurovascularly intact to the right lower extremity and foot. ? ?X-rays:  ?Recent x-rays of the right knee are available for review and have been reviewed by myself.  These films demonstrate moderate to severe tricompartmental degenerative joint disease, primarily involving the lateral and patellofemoral compartments, as manifest by joint space narrowing and osteophyte formation.  A moderate-sized effusion is identified on these plain films.  In addition, there is  evidence of a prior ACL reconstruction with retained metallic interference screws in both the distal femur and proximal tibia.  No fractures, lytic lesions, or other acute bony processes are identified. ? ?

## 2021-09-10 NOTE — ED Provider Notes (Signed)
? ?[facility] ?Provider Note ? ? ? Event Date/Time  ? First MD Initiated Contact with Patient 09/10/21 1345   ?  (approximate) ? ? ?History  ? ?Knee Pain ? ? ?HPI ? ?Tina Peters is a 71 y.o. female who presents to the ED for evaluation of Knee Pain ?  ?I reviewed PCP visit from 4 days ago.  Obese patient,  was evaluated for chronic diarrhea. Stool studies pending.  She is anticoagulated on Xarelto due to paroxysmal atrial fibrillation. ? ?Patient presents to the ED for evaluation of increasing atraumatic swelling of her right knee and associated pain.  She reports objective fevers and chills the past few days as well.  Denies any discrete trauma, injuries or falls. ? ?She reports chronic diarrhea for multiple months, the past 4 months.  She reports 2 or 3 antibiotic courses in that timeframe for unrelated infection such as COVID, bronchitis.  Was not able to give her PCP any stool studies. ? ? ?Physical Exam  ? ?Triage Vital Signs: ?ED Triage Vitals  ?Enc Vitals Group  ?   BP 09/10/21 1309 117/71  ?   Pulse Rate 09/10/21 1309 63  ?   Resp 09/10/21 1309 20  ?   Temp 09/10/21 1309 99.6 ?F (37.6 ?C)  ?   Temp src --   ?   SpO2 09/10/21 1309 94 %  ?   Weight 09/10/21 1310 220 lb (99.8 kg)  ?   Height 09/10/21 1341 '5\' 5"'$  (1.651 m)  ?   Head Circumference --   ?   Peak Flow --   ?   Pain Score 09/10/21 1309 0  ?   Pain Loc --   ?   Pain Edu? --   ?   Excl. in [place]? --   ? ? ?Most recent vital signs: ?Vitals:  ? 09/10/21 1309  ?BP: 117/71  ?Pulse: 63  ?Resp: 20  ?Temp: 99.6 ?F (37.6 ?C)  ?SpO2: 94%  ? ? ?General: Awake, no distress.  Obese.  Appears uncomfortable. ?CV:  Good peripheral perfusion.  ?Resp:  Normal effort.  ?Abd:  No distention.  ?MSK:  Right knee with a palpable effusion, no overlying skin changes.  Does have some pain with passive ranging.  Right foot is distally neurovascularly intact. ?Neuro:  No focal deficits appreciated. ?Other:   ? ? ?ED Results / Procedures / Treatments   ? ?Labs ?(all labs ordered are listed, but only abnormal results are displayed) ?Labs Reviewed  ?CBC - Abnormal; Notable for the following components:  ?    Result Value  ? WBC 15.3 (*)   ? All other components within normal limits  ?BASIC METABOLIC PANEL - Abnormal; Notable for the following components:  ? Potassium 3.3 (*)   ? Glucose, Bld 159 (*)   ? Creatinine, Ser 1.01 (*)   ? Calcium 8.8 (*)   ? GFR, Estimated 60 (*)   ? All other components within normal limits  ?BODY FLUID CULTURE W GRAM STAIN  ?BODY FLUID CELL COUNT WITH DIFFERENTIAL  ?SYNOVIAL FLUID, CRYSTAL  ? ? ?EKG ? ? ?RADIOLOGY ?Plain film the right knee reviewed by me with tricompartmental osteoarthritis and with a joint effusion. ? ?Official radiology report(s): ?DG Knee Complete 4 Views Right ? ?Result Date: 09/10/2021 ?CLINICAL DATA:  Right knee swelling EXAM: RIGHT KNEE - COMPLETE 4+ VIEW COMPARISON:  None Available. FINDINGS: No evidence of acute fracture. Moderate-severe tricompartmental osteoarthritis with joint space loss and bulky marginal  osteophyte formation. Moderate size knee joint effusion. Hardware related to prior ACL surgery. No focal soft tissue swelling is evident. 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 Electronically Signed   By: Davina Poke D.O.   On: 09/10/2021 13:40   ? ?PROCEDURES and INTERVENTIONS: ? ?.Joint Aspiration/Arthrocentesis ? ?Date/Time: 09/10/2021 3:22 PM ?Performed by: Vladimir Crofts, MD ?Authorized by: Vladimir Crofts, MD  ? ?Consent:  ?  Consent obtained:  Verbal ?  Consent given by:  Patient ?  Risks, benefits, and alternatives were discussed: yes   ?Location:  ?  Location:  Knee ?  Knee:  R knee ?Anesthesia:  ?  Anesthesia method:  Local infiltration ?  Local anesthetic:  Lidocaine 1% w/o epi ?Procedure details:  ?  Preparation: Patient was prepped and draped in usual sterile fashion   ?  Needle gauge: 21. ?  Ultrasound guidance: no    ?  Approach:  Medial ?  Aspirate amount:  10cc ?  Aspirate characteristics:  Cloudy ?  Steroid injected: no   ?  Specimen collected: yes   ?Post-procedure details:  ?  Dressing:  Sterile dressing ?  Procedure completion:  Tolerated well, no immediate complications ? ?Medications  ?lidocaine (PF) (XYLOCAINE) 1 % injection 5 mL (5 mLs Infiltration Given 09/10/21 1429)  ?fentaNYL (SUBLIMAZE) injection 75 mcg (75 mcg Intravenous Given 09/10/21 1429)  ? ? ? ?IMPRESSION / MDM / ASSESSMENT AND PLAN / ED COURSE  ?I reviewed the triage vital signs and the nursing notes. ? ?71 year old female presents to the ED with acute atraumatic right knee pain and associated effusion concerning for gout versus septic arthritis versus inflammatory arthritis.  She is hemodynamically stable with normal vitals but blood work demonstrates leukocytosis to 15,000.  Metabolic panel with CKD around baseline.  Plain film with intact remote hardware from ACL repair, effusion is noted.  No evidence of fracture or dislocation.  10 cc of cloudy yellow aspirate was drawn by me and sent to the lab.  Awaiting results to evaluate for septic arthritis at the time of signout. ? ?Clinical Course as of 09/10/21 1524  ?Mon Sep 10, 2021  ?1448 Arthrocentesis performed and well-tolerated.  10 cc of cloudy yellow urine. [DS]  ?1510 Patient signed out to oncoming provider to follow-up on synovial fluid studies to evaluate for septic arthritis [DS]  ?  ?Clinical Course User Index ?[DS] Vladimir Crofts, MD  ? ? ? ?FINAL CLINICAL IMPRESSION(S) / ED DIAGNOSES  ? ?Final diagnoses:  ?Effusion of right knee  ?Acute pain of right knee  ? ? ? ?Rx / DC Orders  ? ?ED Discharge Orders   ? ? None  ? ?  ? ? ? ?Note:  This document was prepared using Dragon voice recognition software and may include unintentional dictation errors. ?  ?Vladimir Crofts, MD ?09/10/21 1525 ? ?

## 2021-09-10 NOTE — ED Provider Notes (Signed)
Patient signed out to me at 3 PM pending synovial fluid analysis.  She is a 71 year old who presents with several days of atraumatic right knee pain.  Her labs are notable for a white count of 15.  Arthrocentesis was performed with return of 10 cc of cloudy fluid per report.  She is pending results of the synovial fluid. ? ?Patient's knee aspirate has about 17,000 white blood cells with majority neutrophils no crystals.  I examined the patient myself the knee is somewhat warm but is not red she has significant pain with ranging it but no pain with micro movements.  All my suspicion for septic arthritis is low given the white count but with her subjective fevers elevated blood white cell count I did discuss with Dr. Roland Rack with Orthopedics, who personally evaluated the patient and felt that she was appropriate for discharge with close follow-up on Wednesday of this week.  Will prescribe prescription for oxycodone for pain in addition to Tylenol she is already been taking. ?  ?Rada Hay, MD ?09/10/21 1707 ? ?

## 2021-09-10 NOTE — ED Triage Notes (Signed)
Pt to ED [facility] from home for right knee pain since Thursday. Denies recent injuries or falls. Swelling noted to right knee.  ?

## 2021-09-12 DIAGNOSIS — M25561 Pain in right knee: Secondary | ICD-10-CM | POA: Diagnosis not present

## 2021-09-12 DIAGNOSIS — M1711 Unilateral primary osteoarthritis, right knee: Secondary | ICD-10-CM | POA: Insufficient documentation

## 2021-09-12 DIAGNOSIS — M25461 Effusion, right knee: Secondary | ICD-10-CM | POA: Diagnosis not present

## 2021-09-13 LAB — BODY FLUID CULTURE W GRAM STAIN: Culture: NO GROWTH

## 2021-09-15 DIAGNOSIS — G4733 Obstructive sleep apnea (adult) (pediatric): Secondary | ICD-10-CM | POA: Diagnosis not present

## 2021-09-15 DIAGNOSIS — R0681 Apnea, not elsewhere classified: Secondary | ICD-10-CM | POA: Diagnosis not present

## 2021-09-15 DIAGNOSIS — I10 Essential (primary) hypertension: Secondary | ICD-10-CM | POA: Diagnosis not present

## 2021-09-21 ENCOUNTER — Other Ambulatory Visit: Payer: Self-pay | Admitting: Family Medicine

## 2021-09-21 DIAGNOSIS — K529 Noninfective gastroenteritis and colitis, unspecified: Secondary | ICD-10-CM | POA: Insufficient documentation

## 2021-09-21 DIAGNOSIS — M25461 Effusion, right knee: Secondary | ICD-10-CM | POA: Diagnosis not present

## 2021-09-21 DIAGNOSIS — M1711 Unilateral primary osteoarthritis, right knee: Secondary | ICD-10-CM | POA: Diagnosis not present

## 2021-09-25 LAB — STOOL CULTURE: E coli, Shiga toxin Assay: NEGATIVE

## 2021-09-25 LAB — CLOSTRIDIUM DIFFICILE BY PCR: Toxigenic C. Difficile by PCR: NEGATIVE

## 2021-09-28 DIAGNOSIS — I5023 Acute on chronic systolic (congestive) heart failure: Secondary | ICD-10-CM | POA: Diagnosis not present

## 2021-09-28 DIAGNOSIS — Z9889 Other specified postprocedural states: Secondary | ICD-10-CM | POA: Diagnosis not present

## 2021-09-28 DIAGNOSIS — E860 Dehydration: Secondary | ICD-10-CM | POA: Diagnosis not present

## 2021-09-28 DIAGNOSIS — E7881 Lipoid dermatoarthritis: Secondary | ICD-10-CM | POA: Diagnosis not present

## 2021-09-28 DIAGNOSIS — R7881 Bacteremia: Secondary | ICD-10-CM | POA: Diagnosis not present

## 2021-09-28 DIAGNOSIS — F32A Depression, unspecified: Secondary | ICD-10-CM | POA: Diagnosis not present

## 2021-09-28 DIAGNOSIS — I4892 Unspecified atrial flutter: Secondary | ICD-10-CM | POA: Diagnosis not present

## 2021-09-28 DIAGNOSIS — J984 Other disorders of lung: Secondary | ICD-10-CM | POA: Diagnosis not present

## 2021-09-28 DIAGNOSIS — R918 Other nonspecific abnormal finding of lung field: Secondary | ICD-10-CM | POA: Diagnosis not present

## 2021-09-28 DIAGNOSIS — Z5181 Encounter for therapeutic drug level monitoring: Secondary | ICD-10-CM | POA: Diagnosis not present

## 2021-09-28 DIAGNOSIS — R570 Cardiogenic shock: Secondary | ICD-10-CM | POA: Diagnosis not present

## 2021-09-28 DIAGNOSIS — Z0389 Encounter for observation for other suspected diseases and conditions ruled out: Secondary | ICD-10-CM | POA: Diagnosis not present

## 2021-09-28 DIAGNOSIS — Z452 Encounter for adjustment and management of vascular access device: Secondary | ICD-10-CM | POA: Diagnosis not present

## 2021-09-28 DIAGNOSIS — I339 Acute and subacute endocarditis, unspecified: Secondary | ICD-10-CM | POA: Diagnosis not present

## 2021-09-28 DIAGNOSIS — R509 Fever, unspecified: Secondary | ICD-10-CM | POA: Diagnosis not present

## 2021-09-28 DIAGNOSIS — R2681 Unsteadiness on feet: Secondary | ICD-10-CM | POA: Diagnosis not present

## 2021-09-28 DIAGNOSIS — M6259 Muscle wasting and atrophy, not elsewhere classified, multiple sites: Secondary | ICD-10-CM | POA: Diagnosis not present

## 2021-09-28 DIAGNOSIS — K047 Periapical abscess without sinus: Secondary | ICD-10-CM | POA: Diagnosis not present

## 2021-09-28 DIAGNOSIS — I050 Rheumatic mitral stenosis: Secondary | ICD-10-CM | POA: Diagnosis not present

## 2021-09-28 DIAGNOSIS — I743 Embolism and thrombosis of arteries of the lower extremities: Secondary | ICD-10-CM | POA: Diagnosis not present

## 2021-09-28 DIAGNOSIS — E875 Hyperkalemia: Secondary | ICD-10-CM | POA: Diagnosis not present

## 2021-09-28 DIAGNOSIS — R519 Headache, unspecified: Secondary | ICD-10-CM | POA: Diagnosis not present

## 2021-09-28 DIAGNOSIS — I3489 Other nonrheumatic mitral valve disorders: Secondary | ICD-10-CM | POA: Diagnosis not present

## 2021-09-28 DIAGNOSIS — I21A1 Myocardial infarction type 2: Secondary | ICD-10-CM | POA: Diagnosis not present

## 2021-09-28 DIAGNOSIS — R5381 Other malaise: Secondary | ICD-10-CM | POA: Diagnosis not present

## 2021-09-28 DIAGNOSIS — E871 Hypo-osmolality and hyponatremia: Secondary | ICD-10-CM | POA: Diagnosis not present

## 2021-09-28 DIAGNOSIS — G4709 Other insomnia: Secondary | ICD-10-CM | POA: Diagnosis not present

## 2021-09-28 DIAGNOSIS — A491 Streptococcal infection, unspecified site: Secondary | ICD-10-CM | POA: Diagnosis not present

## 2021-09-28 DIAGNOSIS — I5020 Unspecified systolic (congestive) heart failure: Secondary | ICD-10-CM | POA: Diagnosis not present

## 2021-09-28 DIAGNOSIS — R110 Nausea: Secondary | ICD-10-CM | POA: Diagnosis not present

## 2021-09-28 DIAGNOSIS — I998 Other disorder of circulatory system: Secondary | ICD-10-CM | POA: Diagnosis not present

## 2021-09-28 DIAGNOSIS — I739 Peripheral vascular disease, unspecified: Secondary | ICD-10-CM | POA: Diagnosis not present

## 2021-09-28 DIAGNOSIS — I4891 Unspecified atrial fibrillation: Secondary | ICD-10-CM | POA: Diagnosis not present

## 2021-09-28 DIAGNOSIS — R0902 Hypoxemia: Secondary | ICD-10-CM | POA: Diagnosis not present

## 2021-09-28 DIAGNOSIS — R079 Chest pain, unspecified: Secondary | ICD-10-CM | POA: Diagnosis not present

## 2021-09-28 DIAGNOSIS — R488 Other symbolic dysfunctions: Secondary | ICD-10-CM | POA: Diagnosis not present

## 2021-09-28 DIAGNOSIS — N179 Acute kidney failure, unspecified: Secondary | ICD-10-CM | POA: Diagnosis not present

## 2021-09-28 DIAGNOSIS — K5900 Constipation, unspecified: Secondary | ICD-10-CM | POA: Diagnosis not present

## 2021-09-28 DIAGNOSIS — Z952 Presence of prosthetic heart valve: Secondary | ICD-10-CM | POA: Diagnosis not present

## 2021-09-28 DIAGNOSIS — R0789 Other chest pain: Secondary | ICD-10-CM | POA: Diagnosis not present

## 2021-09-28 DIAGNOSIS — I2510 Atherosclerotic heart disease of native coronary artery without angina pectoris: Secondary | ICD-10-CM | POA: Diagnosis not present

## 2021-09-28 DIAGNOSIS — B954 Other streptococcus as the cause of diseases classified elsewhere: Secondary | ICD-10-CM | POA: Diagnosis not present

## 2021-09-28 DIAGNOSIS — J45909 Unspecified asthma, uncomplicated: Secondary | ICD-10-CM | POA: Diagnosis not present

## 2021-09-28 DIAGNOSIS — I10 Essential (primary) hypertension: Secondary | ICD-10-CM | POA: Diagnosis not present

## 2021-09-28 DIAGNOSIS — R279 Unspecified lack of coordination: Secondary | ICD-10-CM | POA: Diagnosis not present

## 2021-09-28 DIAGNOSIS — I330 Acute and subacute infective endocarditis: Secondary | ICD-10-CM | POA: Diagnosis not present

## 2021-09-28 DIAGNOSIS — I340 Nonrheumatic mitral (valve) insufficiency: Secondary | ICD-10-CM | POA: Diagnosis not present

## 2021-09-28 DIAGNOSIS — J811 Chronic pulmonary edema: Secondary | ICD-10-CM | POA: Diagnosis not present

## 2021-09-28 DIAGNOSIS — D62 Acute posthemorrhagic anemia: Secondary | ICD-10-CM | POA: Diagnosis not present

## 2021-09-28 DIAGNOSIS — I483 Typical atrial flutter: Secondary | ICD-10-CM | POA: Diagnosis not present

## 2021-09-28 DIAGNOSIS — I442 Atrioventricular block, complete: Secondary | ICD-10-CM | POA: Diagnosis not present

## 2021-09-28 DIAGNOSIS — I517 Cardiomegaly: Secondary | ICD-10-CM | POA: Diagnosis not present

## 2021-09-28 DIAGNOSIS — Z4682 Encounter for fitting and adjustment of non-vascular catheter: Secondary | ICD-10-CM | POA: Diagnosis not present

## 2021-09-28 DIAGNOSIS — R778 Other specified abnormalities of plasma proteins: Secondary | ICD-10-CM | POA: Diagnosis not present

## 2021-09-28 DIAGNOSIS — R109 Unspecified abdominal pain: Secondary | ICD-10-CM | POA: Diagnosis not present

## 2021-09-28 DIAGNOSIS — I38 Endocarditis, valve unspecified: Secondary | ICD-10-CM | POA: Diagnosis not present

## 2021-09-28 DIAGNOSIS — A419 Sepsis, unspecified organism: Secondary | ICD-10-CM | POA: Diagnosis not present

## 2021-09-28 DIAGNOSIS — E8721 Acute metabolic acidosis: Secondary | ICD-10-CM | POA: Diagnosis not present

## 2021-09-28 DIAGNOSIS — R498 Other voice and resonance disorders: Secondary | ICD-10-CM | POA: Diagnosis not present

## 2021-09-28 DIAGNOSIS — M25461 Effusion, right knee: Secondary | ICD-10-CM | POA: Diagnosis not present

## 2021-09-28 DIAGNOSIS — E785 Hyperlipidemia, unspecified: Secondary | ICD-10-CM | POA: Diagnosis not present

## 2021-09-28 DIAGNOSIS — R531 Weakness: Secondary | ICD-10-CM | POA: Diagnosis not present

## 2021-09-28 DIAGNOSIS — Z741 Need for assistance with personal care: Secondary | ICD-10-CM | POA: Diagnosis not present

## 2021-09-28 DIAGNOSIS — M6281 Muscle weakness (generalized): Secondary | ICD-10-CM | POA: Diagnosis not present

## 2021-09-28 DIAGNOSIS — J90 Pleural effusion, not elsewhere classified: Secondary | ICD-10-CM | POA: Diagnosis not present

## 2021-09-28 DIAGNOSIS — K529 Noninfective gastroenteritis and colitis, unspecified: Secondary | ICD-10-CM | POA: Diagnosis not present

## 2021-09-28 DIAGNOSIS — G8918 Other acute postprocedural pain: Secondary | ICD-10-CM | POA: Diagnosis not present

## 2021-09-28 DIAGNOSIS — M1711 Unilateral primary osteoarthritis, right knee: Secondary | ICD-10-CM | POA: Diagnosis not present

## 2021-09-28 DIAGNOSIS — Z4801 Encounter for change or removal of surgical wound dressing: Secondary | ICD-10-CM | POA: Diagnosis not present

## 2021-09-28 DIAGNOSIS — I745 Embolism and thrombosis of iliac artery: Secondary | ICD-10-CM | POA: Diagnosis not present

## 2021-09-28 DIAGNOSIS — I110 Hypertensive heart disease with heart failure: Secondary | ICD-10-CM | POA: Diagnosis not present

## 2021-09-28 DIAGNOSIS — R9431 Abnormal electrocardiogram [ECG] [EKG]: Secondary | ICD-10-CM | POA: Diagnosis not present

## 2021-09-28 DIAGNOSIS — R6520 Severe sepsis without septic shock: Secondary | ICD-10-CM | POA: Diagnosis not present

## 2021-09-28 DIAGNOSIS — Z9911 Dependence on respirator [ventilator] status: Secondary | ICD-10-CM | POA: Diagnosis not present

## 2021-09-28 DIAGNOSIS — R5383 Other fatigue: Secondary | ICD-10-CM | POA: Diagnosis not present

## 2021-09-28 DIAGNOSIS — I052 Rheumatic mitral stenosis with insufficiency: Secondary | ICD-10-CM | POA: Diagnosis not present

## 2021-09-28 DIAGNOSIS — I4819 Other persistent atrial fibrillation: Secondary | ICD-10-CM | POA: Diagnosis not present

## 2021-09-28 DIAGNOSIS — R197 Diarrhea, unspecified: Secondary | ICD-10-CM | POA: Diagnosis not present

## 2021-09-28 DIAGNOSIS — I70229 Atherosclerosis of native arteries of extremities with rest pain, unspecified extremity: Secondary | ICD-10-CM | POA: Diagnosis not present

## 2021-09-28 DIAGNOSIS — R0989 Other specified symptoms and signs involving the circulatory and respiratory systems: Secondary | ICD-10-CM | POA: Diagnosis not present

## 2021-09-28 DIAGNOSIS — J9811 Atelectasis: Secondary | ICD-10-CM | POA: Diagnosis not present

## 2021-09-28 DIAGNOSIS — R000 Tachycardia, unspecified: Secondary | ICD-10-CM | POA: Diagnosis not present

## 2021-09-28 DIAGNOSIS — B955 Unspecified streptococcus as the cause of diseases classified elsewhere: Secondary | ICD-10-CM | POA: Diagnosis not present

## 2021-09-28 DIAGNOSIS — R002 Palpitations: Secondary | ICD-10-CM | POA: Diagnosis not present

## 2021-09-28 DIAGNOSIS — Z20822 Contact with and (suspected) exposure to covid-19: Secondary | ICD-10-CM | POA: Diagnosis not present

## 2021-09-29 DIAGNOSIS — R918 Other nonspecific abnormal finding of lung field: Secondary | ICD-10-CM | POA: Diagnosis not present

## 2021-09-29 DIAGNOSIS — I4891 Unspecified atrial fibrillation: Secondary | ICD-10-CM | POA: Diagnosis not present

## 2021-09-29 DIAGNOSIS — I4892 Unspecified atrial flutter: Secondary | ICD-10-CM | POA: Diagnosis not present

## 2021-09-29 DIAGNOSIS — K529 Noninfective gastroenteritis and colitis, unspecified: Secondary | ICD-10-CM | POA: Insufficient documentation

## 2021-09-29 DIAGNOSIS — R079 Chest pain, unspecified: Secondary | ICD-10-CM | POA: Diagnosis not present

## 2021-09-29 DIAGNOSIS — I4819 Other persistent atrial fibrillation: Secondary | ICD-10-CM | POA: Diagnosis not present

## 2021-09-29 DIAGNOSIS — R002 Palpitations: Secondary | ICD-10-CM | POA: Diagnosis not present

## 2021-09-29 DIAGNOSIS — R519 Headache, unspecified: Secondary | ICD-10-CM | POA: Diagnosis not present

## 2021-09-29 DIAGNOSIS — R9431 Abnormal electrocardiogram [ECG] [EKG]: Secondary | ICD-10-CM | POA: Diagnosis not present

## 2021-09-29 DIAGNOSIS — I517 Cardiomegaly: Secondary | ICD-10-CM | POA: Diagnosis not present

## 2021-09-29 DIAGNOSIS — R778 Other specified abnormalities of plasma proteins: Secondary | ICD-10-CM | POA: Diagnosis not present

## 2021-09-29 DIAGNOSIS — R109 Unspecified abdominal pain: Secondary | ICD-10-CM | POA: Diagnosis not present

## 2021-09-29 DIAGNOSIS — E871 Hypo-osmolality and hyponatremia: Secondary | ICD-10-CM | POA: Diagnosis not present

## 2021-09-30 DIAGNOSIS — N179 Acute kidney failure, unspecified: Secondary | ICD-10-CM | POA: Diagnosis not present

## 2021-09-30 DIAGNOSIS — R7881 Bacteremia: Secondary | ICD-10-CM | POA: Diagnosis not present

## 2021-09-30 DIAGNOSIS — I4891 Unspecified atrial fibrillation: Secondary | ICD-10-CM | POA: Diagnosis not present

## 2021-09-30 DIAGNOSIS — B954 Other streptococcus as the cause of diseases classified elsewhere: Secondary | ICD-10-CM | POA: Diagnosis not present

## 2021-09-30 DIAGNOSIS — B955 Unspecified streptococcus as the cause of diseases classified elsewhere: Secondary | ICD-10-CM | POA: Insufficient documentation

## 2021-09-30 DIAGNOSIS — E871 Hypo-osmolality and hyponatremia: Secondary | ICD-10-CM | POA: Diagnosis not present

## 2021-09-30 DIAGNOSIS — I4819 Other persistent atrial fibrillation: Secondary | ICD-10-CM | POA: Diagnosis not present

## 2021-10-01 DIAGNOSIS — M25461 Effusion, right knee: Secondary | ICD-10-CM | POA: Diagnosis not present

## 2021-10-01 DIAGNOSIS — I21A1 Myocardial infarction type 2: Secondary | ICD-10-CM | POA: Diagnosis not present

## 2021-10-01 DIAGNOSIS — R197 Diarrhea, unspecified: Secondary | ICD-10-CM | POA: Diagnosis not present

## 2021-10-01 DIAGNOSIS — N179 Acute kidney failure, unspecified: Secondary | ICD-10-CM | POA: Diagnosis not present

## 2021-10-01 DIAGNOSIS — J90 Pleural effusion, not elsewhere classified: Secondary | ICD-10-CM | POA: Diagnosis not present

## 2021-10-01 DIAGNOSIS — A491 Streptococcal infection, unspecified site: Secondary | ICD-10-CM | POA: Diagnosis not present

## 2021-10-01 DIAGNOSIS — R7881 Bacteremia: Secondary | ICD-10-CM | POA: Diagnosis not present

## 2021-10-01 DIAGNOSIS — K529 Noninfective gastroenteritis and colitis, unspecified: Secondary | ICD-10-CM | POA: Diagnosis not present

## 2021-10-01 DIAGNOSIS — R918 Other nonspecific abnormal finding of lung field: Secondary | ICD-10-CM | POA: Diagnosis not present

## 2021-10-01 DIAGNOSIS — K5900 Constipation, unspecified: Secondary | ICD-10-CM | POA: Diagnosis not present

## 2021-10-01 DIAGNOSIS — I4891 Unspecified atrial fibrillation: Secondary | ICD-10-CM | POA: Diagnosis not present

## 2021-10-01 DIAGNOSIS — I4819 Other persistent atrial fibrillation: Secondary | ICD-10-CM | POA: Diagnosis not present

## 2021-10-02 DIAGNOSIS — M1711 Unilateral primary osteoarthritis, right knee: Secondary | ICD-10-CM | POA: Diagnosis not present

## 2021-10-02 DIAGNOSIS — I38 Endocarditis, valve unspecified: Secondary | ICD-10-CM | POA: Diagnosis not present

## 2021-10-02 DIAGNOSIS — R7881 Bacteremia: Secondary | ICD-10-CM | POA: Diagnosis not present

## 2021-10-02 DIAGNOSIS — N179 Acute kidney failure, unspecified: Secondary | ICD-10-CM | POA: Diagnosis not present

## 2021-10-02 DIAGNOSIS — I052 Rheumatic mitral stenosis with insufficiency: Secondary | ICD-10-CM | POA: Diagnosis not present

## 2021-10-02 DIAGNOSIS — E871 Hypo-osmolality and hyponatremia: Secondary | ICD-10-CM | POA: Diagnosis not present

## 2021-10-02 DIAGNOSIS — I4819 Other persistent atrial fibrillation: Secondary | ICD-10-CM | POA: Diagnosis not present

## 2021-10-02 DIAGNOSIS — I330 Acute and subacute infective endocarditis: Secondary | ICD-10-CM | POA: Insufficient documentation

## 2021-10-02 DIAGNOSIS — Z0389 Encounter for observation for other suspected diseases and conditions ruled out: Secondary | ICD-10-CM | POA: Diagnosis not present

## 2021-10-02 DIAGNOSIS — K047 Periapical abscess without sinus: Secondary | ICD-10-CM | POA: Diagnosis not present

## 2021-10-03 DIAGNOSIS — K047 Periapical abscess without sinus: Secondary | ICD-10-CM | POA: Diagnosis not present

## 2021-10-03 DIAGNOSIS — M25461 Effusion, right knee: Secondary | ICD-10-CM | POA: Diagnosis not present

## 2021-10-03 DIAGNOSIS — I339 Acute and subacute endocarditis, unspecified: Secondary | ICD-10-CM | POA: Diagnosis not present

## 2021-10-03 DIAGNOSIS — A491 Streptococcal infection, unspecified site: Secondary | ICD-10-CM | POA: Diagnosis not present

## 2021-10-03 DIAGNOSIS — E871 Hypo-osmolality and hyponatremia: Secondary | ICD-10-CM | POA: Diagnosis not present

## 2021-10-03 DIAGNOSIS — E7881 Lipoid dermatoarthritis: Secondary | ICD-10-CM | POA: Diagnosis not present

## 2021-10-03 DIAGNOSIS — B955 Unspecified streptococcus as the cause of diseases classified elsewhere: Secondary | ICD-10-CM | POA: Diagnosis not present

## 2021-10-03 DIAGNOSIS — I3489 Other nonrheumatic mitral valve disorders: Secondary | ICD-10-CM | POA: Diagnosis not present

## 2021-10-03 DIAGNOSIS — N179 Acute kidney failure, unspecified: Secondary | ICD-10-CM | POA: Diagnosis not present

## 2021-10-03 DIAGNOSIS — R7881 Bacteremia: Secondary | ICD-10-CM | POA: Diagnosis not present

## 2021-10-03 DIAGNOSIS — I4819 Other persistent atrial fibrillation: Secondary | ICD-10-CM | POA: Diagnosis not present

## 2021-10-03 DIAGNOSIS — R197 Diarrhea, unspecified: Secondary | ICD-10-CM | POA: Diagnosis not present

## 2021-10-04 DIAGNOSIS — N179 Acute kidney failure, unspecified: Secondary | ICD-10-CM | POA: Diagnosis not present

## 2021-10-04 DIAGNOSIS — I4819 Other persistent atrial fibrillation: Secondary | ICD-10-CM | POA: Diagnosis not present

## 2021-10-04 DIAGNOSIS — J90 Pleural effusion, not elsewhere classified: Secondary | ICD-10-CM | POA: Diagnosis not present

## 2021-10-04 DIAGNOSIS — E871 Hypo-osmolality and hyponatremia: Secondary | ICD-10-CM | POA: Diagnosis not present

## 2021-10-04 DIAGNOSIS — R7881 Bacteremia: Secondary | ICD-10-CM | POA: Diagnosis not present

## 2021-10-04 DIAGNOSIS — J811 Chronic pulmonary edema: Secondary | ICD-10-CM | POA: Diagnosis not present

## 2021-10-04 HISTORY — PX: VALVE REPLACEMENT: SUR13

## 2021-10-05 DIAGNOSIS — N179 Acute kidney failure, unspecified: Secondary | ICD-10-CM | POA: Diagnosis not present

## 2021-10-05 DIAGNOSIS — E871 Hypo-osmolality and hyponatremia: Secondary | ICD-10-CM | POA: Diagnosis not present

## 2021-10-05 DIAGNOSIS — R7881 Bacteremia: Secondary | ICD-10-CM | POA: Diagnosis not present

## 2021-10-05 DIAGNOSIS — I4819 Other persistent atrial fibrillation: Secondary | ICD-10-CM | POA: Diagnosis not present

## 2021-10-05 DIAGNOSIS — J811 Chronic pulmonary edema: Secondary | ICD-10-CM | POA: Diagnosis not present

## 2021-10-05 DIAGNOSIS — J90 Pleural effusion, not elsewhere classified: Secondary | ICD-10-CM | POA: Diagnosis not present

## 2021-10-05 DIAGNOSIS — Z452 Encounter for adjustment and management of vascular access device: Secondary | ICD-10-CM | POA: Diagnosis not present

## 2021-10-06 DIAGNOSIS — E871 Hypo-osmolality and hyponatremia: Secondary | ICD-10-CM | POA: Diagnosis not present

## 2021-10-06 DIAGNOSIS — R7881 Bacteremia: Secondary | ICD-10-CM | POA: Diagnosis not present

## 2021-10-06 DIAGNOSIS — I4891 Unspecified atrial fibrillation: Secondary | ICD-10-CM | POA: Diagnosis not present

## 2021-10-06 DIAGNOSIS — N179 Acute kidney failure, unspecified: Secondary | ICD-10-CM | POA: Diagnosis not present

## 2021-10-07 DIAGNOSIS — J90 Pleural effusion, not elsewhere classified: Secondary | ICD-10-CM | POA: Diagnosis not present

## 2021-10-07 DIAGNOSIS — I517 Cardiomegaly: Secondary | ICD-10-CM | POA: Diagnosis not present

## 2021-10-07 DIAGNOSIS — N179 Acute kidney failure, unspecified: Secondary | ICD-10-CM | POA: Diagnosis not present

## 2021-10-07 DIAGNOSIS — I4819 Other persistent atrial fibrillation: Secondary | ICD-10-CM | POA: Diagnosis not present

## 2021-10-07 DIAGNOSIS — J811 Chronic pulmonary edema: Secondary | ICD-10-CM | POA: Diagnosis not present

## 2021-10-07 DIAGNOSIS — E871 Hypo-osmolality and hyponatremia: Secondary | ICD-10-CM | POA: Diagnosis not present

## 2021-10-07 DIAGNOSIS — R7881 Bacteremia: Secondary | ICD-10-CM | POA: Diagnosis not present

## 2021-10-08 DIAGNOSIS — R7881 Bacteremia: Secondary | ICD-10-CM | POA: Diagnosis not present

## 2021-10-08 DIAGNOSIS — I330 Acute and subacute infective endocarditis: Secondary | ICD-10-CM | POA: Diagnosis not present

## 2021-10-08 DIAGNOSIS — I4891 Unspecified atrial fibrillation: Secondary | ICD-10-CM | POA: Diagnosis not present

## 2021-10-08 DIAGNOSIS — E871 Hypo-osmolality and hyponatremia: Secondary | ICD-10-CM | POA: Diagnosis not present

## 2021-10-08 DIAGNOSIS — I4819 Other persistent atrial fibrillation: Secondary | ICD-10-CM | POA: Diagnosis not present

## 2021-10-08 DIAGNOSIS — N179 Acute kidney failure, unspecified: Secondary | ICD-10-CM | POA: Diagnosis not present

## 2021-10-08 DIAGNOSIS — I501 Left ventricular failure: Secondary | ICD-10-CM | POA: Insufficient documentation

## 2021-10-09 DIAGNOSIS — R7881 Bacteremia: Secondary | ICD-10-CM | POA: Diagnosis not present

## 2021-10-09 DIAGNOSIS — I4819 Other persistent atrial fibrillation: Secondary | ICD-10-CM | POA: Diagnosis not present

## 2021-10-09 DIAGNOSIS — I050 Rheumatic mitral stenosis: Secondary | ICD-10-CM | POA: Diagnosis not present

## 2021-10-09 DIAGNOSIS — I2510 Atherosclerotic heart disease of native coronary artery without angina pectoris: Secondary | ICD-10-CM | POA: Diagnosis not present

## 2021-10-09 DIAGNOSIS — I330 Acute and subacute infective endocarditis: Secondary | ICD-10-CM | POA: Diagnosis not present

## 2021-10-10 DIAGNOSIS — Z5181 Encounter for therapeutic drug level monitoring: Secondary | ICD-10-CM | POA: Diagnosis not present

## 2021-10-10 DIAGNOSIS — R7881 Bacteremia: Secondary | ICD-10-CM | POA: Diagnosis not present

## 2021-10-10 DIAGNOSIS — I2510 Atherosclerotic heart disease of native coronary artery without angina pectoris: Secondary | ICD-10-CM | POA: Diagnosis not present

## 2021-10-10 DIAGNOSIS — I330 Acute and subacute infective endocarditis: Secondary | ICD-10-CM | POA: Diagnosis not present

## 2021-10-10 DIAGNOSIS — I4819 Other persistent atrial fibrillation: Secondary | ICD-10-CM | POA: Diagnosis not present

## 2021-10-10 DIAGNOSIS — B955 Unspecified streptococcus as the cause of diseases classified elsewhere: Secondary | ICD-10-CM | POA: Diagnosis not present

## 2021-10-10 DIAGNOSIS — I050 Rheumatic mitral stenosis: Secondary | ICD-10-CM | POA: Diagnosis not present

## 2021-10-11 DIAGNOSIS — I340 Nonrheumatic mitral (valve) insufficiency: Secondary | ICD-10-CM | POA: Diagnosis not present

## 2021-10-11 DIAGNOSIS — D62 Acute posthemorrhagic anemia: Secondary | ICD-10-CM | POA: Diagnosis not present

## 2021-10-11 DIAGNOSIS — I998 Other disorder of circulatory system: Secondary | ICD-10-CM | POA: Diagnosis not present

## 2021-10-11 DIAGNOSIS — I3489 Other nonrheumatic mitral valve disorders: Secondary | ICD-10-CM | POA: Diagnosis not present

## 2021-10-11 DIAGNOSIS — R0902 Hypoxemia: Secondary | ICD-10-CM | POA: Diagnosis not present

## 2021-10-11 DIAGNOSIS — Z9911 Dependence on respirator [ventilator] status: Secondary | ICD-10-CM | POA: Diagnosis not present

## 2021-10-11 DIAGNOSIS — I330 Acute and subacute infective endocarditis: Secondary | ICD-10-CM | POA: Diagnosis not present

## 2021-10-11 DIAGNOSIS — Z452 Encounter for adjustment and management of vascular access device: Secondary | ICD-10-CM | POA: Diagnosis not present

## 2021-10-11 DIAGNOSIS — Z4682 Encounter for fitting and adjustment of non-vascular catheter: Secondary | ICD-10-CM | POA: Diagnosis not present

## 2021-10-11 DIAGNOSIS — J811 Chronic pulmonary edema: Secondary | ICD-10-CM | POA: Diagnosis not present

## 2021-10-11 DIAGNOSIS — Z9889 Other specified postprocedural states: Secondary | ICD-10-CM | POA: Diagnosis not present

## 2021-10-11 DIAGNOSIS — I743 Embolism and thrombosis of arteries of the lower extremities: Secondary | ICD-10-CM | POA: Diagnosis not present

## 2021-10-11 DIAGNOSIS — Z952 Presence of prosthetic heart valve: Secondary | ICD-10-CM | POA: Diagnosis not present

## 2021-10-11 DIAGNOSIS — I739 Peripheral vascular disease, unspecified: Secondary | ICD-10-CM | POA: Diagnosis not present

## 2021-10-11 DIAGNOSIS — I38 Endocarditis, valve unspecified: Secondary | ICD-10-CM | POA: Diagnosis not present

## 2021-10-11 DIAGNOSIS — G8918 Other acute postprocedural pain: Secondary | ICD-10-CM | POA: Diagnosis not present

## 2021-10-12 DIAGNOSIS — B955 Unspecified streptococcus as the cause of diseases classified elsewhere: Secondary | ICD-10-CM | POA: Diagnosis not present

## 2021-10-12 DIAGNOSIS — I330 Acute and subacute infective endocarditis: Secondary | ICD-10-CM | POA: Diagnosis not present

## 2021-10-12 DIAGNOSIS — E8721 Acute metabolic acidosis: Secondary | ICD-10-CM | POA: Diagnosis not present

## 2021-10-12 DIAGNOSIS — D62 Acute posthemorrhagic anemia: Secondary | ICD-10-CM | POA: Diagnosis not present

## 2021-10-12 DIAGNOSIS — R570 Cardiogenic shock: Secondary | ICD-10-CM | POA: Diagnosis not present

## 2021-10-12 DIAGNOSIS — J9811 Atelectasis: Secondary | ICD-10-CM | POA: Diagnosis not present

## 2021-10-12 DIAGNOSIS — Z792 Long term (current) use of antibiotics: Secondary | ICD-10-CM | POA: Insufficient documentation

## 2021-10-12 DIAGNOSIS — Z5181 Encounter for therapeutic drug level monitoring: Secondary | ICD-10-CM | POA: Diagnosis not present

## 2021-10-12 DIAGNOSIS — K047 Periapical abscess without sinus: Secondary | ICD-10-CM | POA: Insufficient documentation

## 2021-10-12 DIAGNOSIS — Z9889 Other specified postprocedural states: Secondary | ICD-10-CM | POA: Diagnosis not present

## 2021-10-12 DIAGNOSIS — Z9189 Other specified personal risk factors, not elsewhere classified: Secondary | ICD-10-CM | POA: Insufficient documentation

## 2021-10-12 DIAGNOSIS — Z9911 Dependence on respirator [ventilator] status: Secondary | ICD-10-CM | POA: Diagnosis not present

## 2021-10-13 DIAGNOSIS — J984 Other disorders of lung: Secondary | ICD-10-CM | POA: Diagnosis not present

## 2021-10-13 DIAGNOSIS — R570 Cardiogenic shock: Secondary | ICD-10-CM | POA: Diagnosis not present

## 2021-10-13 DIAGNOSIS — E8721 Acute metabolic acidosis: Secondary | ICD-10-CM | POA: Diagnosis not present

## 2021-10-13 DIAGNOSIS — R0989 Other specified symptoms and signs involving the circulatory and respiratory systems: Secondary | ICD-10-CM | POA: Diagnosis not present

## 2021-10-13 DIAGNOSIS — D62 Acute posthemorrhagic anemia: Secondary | ICD-10-CM | POA: Diagnosis not present

## 2021-10-13 DIAGNOSIS — N179 Acute kidney failure, unspecified: Secondary | ICD-10-CM | POA: Diagnosis not present

## 2021-10-13 DIAGNOSIS — I330 Acute and subacute infective endocarditis: Secondary | ICD-10-CM | POA: Diagnosis not present

## 2021-10-13 DIAGNOSIS — R918 Other nonspecific abnormal finding of lung field: Secondary | ICD-10-CM | POA: Diagnosis not present

## 2021-10-14 DIAGNOSIS — N179 Acute kidney failure, unspecified: Secondary | ICD-10-CM | POA: Diagnosis not present

## 2021-10-14 DIAGNOSIS — R570 Cardiogenic shock: Secondary | ICD-10-CM | POA: Diagnosis not present

## 2021-10-14 DIAGNOSIS — I330 Acute and subacute infective endocarditis: Secondary | ICD-10-CM | POA: Diagnosis not present

## 2021-10-14 DIAGNOSIS — E8721 Acute metabolic acidosis: Secondary | ICD-10-CM | POA: Diagnosis not present

## 2021-10-14 DIAGNOSIS — Z9889 Other specified postprocedural states: Secondary | ICD-10-CM | POA: Diagnosis not present

## 2021-10-14 DIAGNOSIS — I483 Typical atrial flutter: Secondary | ICD-10-CM | POA: Diagnosis not present

## 2021-10-15 DIAGNOSIS — D62 Acute posthemorrhagic anemia: Secondary | ICD-10-CM | POA: Diagnosis not present

## 2021-10-15 DIAGNOSIS — G8918 Other acute postprocedural pain: Secondary | ICD-10-CM | POA: Diagnosis not present

## 2021-10-15 DIAGNOSIS — B955 Unspecified streptococcus as the cause of diseases classified elsewhere: Secondary | ICD-10-CM | POA: Diagnosis not present

## 2021-10-15 DIAGNOSIS — I330 Acute and subacute infective endocarditis: Secondary | ICD-10-CM | POA: Diagnosis not present

## 2021-10-15 DIAGNOSIS — I483 Typical atrial flutter: Secondary | ICD-10-CM | POA: Diagnosis not present

## 2021-10-15 DIAGNOSIS — Z9889 Other specified postprocedural states: Secondary | ICD-10-CM | POA: Diagnosis not present

## 2021-10-15 DIAGNOSIS — E8721 Acute metabolic acidosis: Secondary | ICD-10-CM | POA: Diagnosis not present

## 2021-10-15 DIAGNOSIS — R570 Cardiogenic shock: Secondary | ICD-10-CM | POA: Diagnosis not present

## 2021-10-15 DIAGNOSIS — Z5181 Encounter for therapeutic drug level monitoring: Secondary | ICD-10-CM | POA: Diagnosis not present

## 2021-10-15 DIAGNOSIS — N179 Acute kidney failure, unspecified: Secondary | ICD-10-CM | POA: Diagnosis not present

## 2021-10-16 DIAGNOSIS — J9811 Atelectasis: Secondary | ICD-10-CM | POA: Diagnosis not present

## 2021-10-16 DIAGNOSIS — Z4801 Encounter for change or removal of surgical wound dressing: Secondary | ICD-10-CM | POA: Diagnosis not present

## 2021-10-16 DIAGNOSIS — Z9889 Other specified postprocedural states: Secondary | ICD-10-CM | POA: Diagnosis not present

## 2021-10-16 DIAGNOSIS — D62 Acute posthemorrhagic anemia: Secondary | ICD-10-CM | POA: Diagnosis not present

## 2021-10-16 DIAGNOSIS — R918 Other nonspecific abnormal finding of lung field: Secondary | ICD-10-CM | POA: Diagnosis not present

## 2021-10-16 DIAGNOSIS — R570 Cardiogenic shock: Secondary | ICD-10-CM | POA: Diagnosis not present

## 2021-10-16 DIAGNOSIS — N179 Acute kidney failure, unspecified: Secondary | ICD-10-CM | POA: Diagnosis not present

## 2021-10-16 DIAGNOSIS — I330 Acute and subacute infective endocarditis: Secondary | ICD-10-CM | POA: Diagnosis not present

## 2021-10-16 DIAGNOSIS — E8721 Acute metabolic acidosis: Secondary | ICD-10-CM | POA: Diagnosis not present

## 2021-10-16 DIAGNOSIS — I998 Other disorder of circulatory system: Secondary | ICD-10-CM | POA: Diagnosis not present

## 2021-10-17 DIAGNOSIS — R0989 Other specified symptoms and signs involving the circulatory and respiratory systems: Secondary | ICD-10-CM | POA: Diagnosis not present

## 2021-10-17 DIAGNOSIS — J9811 Atelectasis: Secondary | ICD-10-CM | POA: Diagnosis not present

## 2021-10-17 DIAGNOSIS — E8721 Acute metabolic acidosis: Secondary | ICD-10-CM | POA: Diagnosis not present

## 2021-10-17 DIAGNOSIS — N179 Acute kidney failure, unspecified: Secondary | ICD-10-CM | POA: Diagnosis not present

## 2021-10-17 DIAGNOSIS — Z952 Presence of prosthetic heart valve: Secondary | ICD-10-CM | POA: Diagnosis not present

## 2021-10-17 DIAGNOSIS — R570 Cardiogenic shock: Secondary | ICD-10-CM | POA: Diagnosis not present

## 2021-10-17 DIAGNOSIS — Z452 Encounter for adjustment and management of vascular access device: Secondary | ICD-10-CM | POA: Diagnosis not present

## 2021-10-17 DIAGNOSIS — D62 Acute posthemorrhagic anemia: Secondary | ICD-10-CM | POA: Diagnosis not present

## 2021-10-18 DIAGNOSIS — Z9889 Other specified postprocedural states: Secondary | ICD-10-CM | POA: Diagnosis not present

## 2021-10-18 DIAGNOSIS — I330 Acute and subacute infective endocarditis: Secondary | ICD-10-CM | POA: Diagnosis not present

## 2021-10-21 ENCOUNTER — Other Ambulatory Visit: Payer: Self-pay | Admitting: Cardiology

## 2021-10-24 ENCOUNTER — Other Ambulatory Visit: Payer: Self-pay | Admitting: Family Medicine

## 2021-10-30 DIAGNOSIS — F32A Depression, unspecified: Secondary | ICD-10-CM | POA: Diagnosis not present

## 2021-10-30 DIAGNOSIS — G4709 Other insomnia: Secondary | ICD-10-CM | POA: Diagnosis not present

## 2021-10-30 DIAGNOSIS — I2510 Atherosclerotic heart disease of native coronary artery without angina pectoris: Secondary | ICD-10-CM | POA: Diagnosis not present

## 2021-10-30 DIAGNOSIS — R5381 Other malaise: Secondary | ICD-10-CM | POA: Diagnosis not present

## 2021-10-30 DIAGNOSIS — I4891 Unspecified atrial fibrillation: Secondary | ICD-10-CM | POA: Diagnosis not present

## 2021-10-30 DIAGNOSIS — I5020 Unspecified systolic (congestive) heart failure: Secondary | ICD-10-CM | POA: Diagnosis not present

## 2021-10-30 DIAGNOSIS — I998 Other disorder of circulatory system: Secondary | ICD-10-CM | POA: Diagnosis not present

## 2021-10-30 DIAGNOSIS — Z741 Need for assistance with personal care: Secondary | ICD-10-CM | POA: Diagnosis not present

## 2021-10-30 DIAGNOSIS — R7881 Bacteremia: Secondary | ICD-10-CM | POA: Diagnosis not present

## 2021-10-30 DIAGNOSIS — M6281 Muscle weakness (generalized): Secondary | ICD-10-CM | POA: Diagnosis not present

## 2021-10-30 DIAGNOSIS — R2681 Unsteadiness on feet: Secondary | ICD-10-CM | POA: Diagnosis not present

## 2021-10-30 DIAGNOSIS — R498 Other voice and resonance disorders: Secondary | ICD-10-CM | POA: Diagnosis not present

## 2021-10-30 DIAGNOSIS — M6259 Muscle wasting and atrophy, not elsewhere classified, multiple sites: Secondary | ICD-10-CM | POA: Diagnosis not present

## 2021-10-30 DIAGNOSIS — I10 Essential (primary) hypertension: Secondary | ICD-10-CM | POA: Diagnosis not present

## 2021-10-30 DIAGNOSIS — Z79899 Other long term (current) drug therapy: Secondary | ICD-10-CM | POA: Diagnosis not present

## 2021-10-30 DIAGNOSIS — K047 Periapical abscess without sinus: Secondary | ICD-10-CM | POA: Diagnosis not present

## 2021-10-30 DIAGNOSIS — R488 Other symbolic dysfunctions: Secondary | ICD-10-CM | POA: Diagnosis not present

## 2021-10-30 DIAGNOSIS — B955 Unspecified streptococcus as the cause of diseases classified elsewhere: Secondary | ICD-10-CM | POA: Diagnosis not present

## 2021-10-30 DIAGNOSIS — R279 Unspecified lack of coordination: Secondary | ICD-10-CM | POA: Diagnosis not present

## 2021-10-30 DIAGNOSIS — I38 Endocarditis, valve unspecified: Secondary | ICD-10-CM | POA: Diagnosis not present

## 2021-10-30 DIAGNOSIS — I330 Acute and subacute infective endocarditis: Secondary | ICD-10-CM | POA: Diagnosis not present

## 2021-10-30 DIAGNOSIS — K529 Noninfective gastroenteritis and colitis, unspecified: Secondary | ICD-10-CM | POA: Diagnosis not present

## 2021-10-31 DIAGNOSIS — I4891 Unspecified atrial fibrillation: Secondary | ICD-10-CM | POA: Diagnosis not present

## 2021-10-31 DIAGNOSIS — I998 Other disorder of circulatory system: Secondary | ICD-10-CM | POA: Diagnosis not present

## 2021-10-31 DIAGNOSIS — I38 Endocarditis, valve unspecified: Secondary | ICD-10-CM | POA: Diagnosis not present

## 2021-10-31 DIAGNOSIS — M6281 Muscle weakness (generalized): Secondary | ICD-10-CM | POA: Diagnosis not present

## 2021-11-07 ENCOUNTER — Inpatient Hospital Stay: Payer: Medicare Other | Admitting: Physician Assistant

## 2021-11-07 ENCOUNTER — Encounter: Payer: Self-pay | Admitting: Physician Assistant

## 2021-11-07 ENCOUNTER — Telehealth: Payer: Self-pay

## 2021-11-07 ENCOUNTER — Ambulatory Visit (INDEPENDENT_AMBULATORY_CARE_PROVIDER_SITE_OTHER): Payer: Medicare HMO | Admitting: Physician Assistant

## 2021-11-07 VITALS — BP 93/63 | HR 57 | Temp 98.0°F | Ht 65.0 in | Wt 189.6 lb

## 2021-11-07 DIAGNOSIS — I998 Other disorder of circulatory system: Secondary | ICD-10-CM

## 2021-11-07 DIAGNOSIS — Z952 Presence of prosthetic heart valve: Secondary | ICD-10-CM

## 2021-11-07 DIAGNOSIS — I5020 Unspecified systolic (congestive) heart failure: Secondary | ICD-10-CM

## 2021-11-07 DIAGNOSIS — I482 Chronic atrial fibrillation, unspecified: Secondary | ICD-10-CM | POA: Diagnosis not present

## 2021-11-07 MED ORDER — AMIODARONE HCL 200 MG PO TABS: 200.0000 mg | ORAL_TABLET | Freq: Every day | ORAL | 0 refills | Status: DC

## 2021-11-07 MED ORDER — SPIRONOLACTONE 25 MG PO TABS: 12.5000 mg | ORAL_TABLET | Freq: Every day | ORAL | 0 refills | Status: DC

## 2021-11-07 NOTE — Telephone Encounter (Signed)
Please refer to incoming telephone encounter

## 2021-11-07 NOTE — Progress Notes (Unsigned)
I,Sha'taria Tyson,acting as a Education administrator for [employer], PA-C.,have documented all relevant documentation on the behalf of [name], PA-C,as directed by  [name], PA-C while in the presence of [name], PA-C.  Virtual telephone visit  Virtual Visit via Telephone Note   This visit type was conducted due to national recommendations for restrictions regarding the COVID-19 Pandemic (e.g. social distancing) in an effort to limit this patient's exposure and mitigate transmission in our community. Due to her co-morbid illnesses, this patient is at least at moderate risk for complications without adequate follow up. This format is felt to be most appropriate for this patient at this time. The patient did not have access to video technology or had technical difficulties with video requiring transitioning to audio format only (telephone). Physical exam was limited to content and character of the telephone converstion.    Patient location: home w/ daughter Provider location: [facility]  I discussed the limitations of evaluation and management by telemedicine and the availability of in person appointments. The patient expressed understanding and agreed to proceed.   Visit Date: 11/07/2021  Today's healthcare provider: [name], PA-C   Cc. Hospital follow up   Subjective    HPI   Follow up Hospitalization 6/27 got her out yesterday;   Patient was admitted to [facility] on 09/29/21 and discharged to a SNF. Her daughter signed her out today d/t concerns over safety. Pt now resides with daughter.  She was treated for Streptococcus sanguinis MV endocarditis/bacteremia, acute on chronic afib.   Pmhx of HTN, HLD, afib on Xarelto, asthma, depression, and anxiety who presented with weakness and malaise. Found to be in afib with RVR and have Strep sanguinus bacteremia A large (~2 cm) mobile mass on anterior mitral leaflet. Now s/p mitral valve replacement w/ post  op left limb ischemia and left embolectomy and fasciotomy. Post of HFrEF EF 40-45%. Persistent afib post op given amiodarone, metoprolol and xarelto.   Discharged on: amiodarone 200 MG tablet, asa,magnesium, spironolactone, metoprolol 50 mg, atorvastatin 40, robaxin [phone], setraline 50. xarelto 20  Pt now out of spironolatone and amiodarone.   Small spot on L leg that was open oozing clear; another spot on L leg where she accidentally ripped the   Still incontinent, diaper rash  ----------------------------------------------------------------------------------------- -   Medications: Outpatient Medications Prior to Visit  Medication Sig   acetaminophen (TYLENOL) 325 MG tablet Take by mouth. 4 tablets as needed   albuterol (VENTOLIN HFA) 108 (90 Base) MCG/ACT inhaler Inhale 2 puffs into the lungs every 6 (six) hours as needed for wheezing or shortness of breath.   atorvastatin (LIPITOR) 40 MG tablet SMARTSIG:1 Tablet(s) By Mouth Every Evening   hydrocortisone 2.5 % cream Apply 1 application topically as needed.   methocarbamol (ROBAXIN) 500 MG tablet Take 1 tablet (500 mg total) by mouth every 8 (eight) hours as needed.   metoprolol succinate (TOPROL-XL) 25 MG 24 hr tablet TAKE 1 TABLET BY MOUTH  DAILY   MULTAQ 400 MG tablet TAKE 1 TABLET BY MOUTH  TWICE DAILY WITH A MEAL   rivaroxaban (XARELTO) 20 MG TABS tablet Take 1 tablet (20 mg total) by mouth daily with supper. Please schedule office visit before any future refill.   sertraline (ZOLOFT) 50 MG tablet Take 1 tablet (50 mg total) by mouth daily.   No facility-administered medications prior to visit.    Review of Systems    Objective    Blood pressure 93/63, pulse (!) 57,  temperature 98 F (36.7 C), temperature source Temporal, height '5\' 5"'$  (1.651 m), weight 189 lb 9.6 oz (86 kg).      Assessment & Plan     ***  No follow-ups on file.    I discussed the assessment and treatment plan with the patient. The patient was  provided an opportunity to ask questions and all were answered. The patient agreed with the plan and demonstrated an understanding of the instructions.   The patient was advised to call back or seek an in-person evaluation if the symptoms worsen or if the condition fails to improve as anticipated.  I provided *** minutes of non-face-to-face time during this encounter.  {provider attestation***:1}  [name], PA-C [facility] [phone] (phone) [phone] (fax)  [place]

## 2021-11-07 NOTE — Telephone Encounter (Signed)
Copied from [place] [id number]. Topic: Appointment Scheduling - Scheduling Inquiry for Clinic >> Nov 07, 2021  8:09 AM Tiffany B wrote: Reason for CRM: Patient was discharged from physical therapy rehab and unable to come in office because it is hard for her to walk. Patient will need PCP to take over her medication prescribed in rehab and would like you to call her daughter who will call her on  3 way at 3 pm for her telephone appointment. Patient will call her daughter Ignacia Marvel and advise her of the upcoming telephone appointment. Please call [phone]

## 2021-11-08 DIAGNOSIS — I5020 Unspecified systolic (congestive) heart failure: Secondary | ICD-10-CM | POA: Insufficient documentation

## 2021-11-08 DIAGNOSIS — I482 Chronic atrial fibrillation, unspecified: Secondary | ICD-10-CM | POA: Insufficient documentation

## 2021-11-08 DIAGNOSIS — I998 Other disorder of circulatory system: Secondary | ICD-10-CM | POA: Insufficient documentation

## 2021-11-08 DIAGNOSIS — Z952 Presence of prosthetic heart valve: Secondary | ICD-10-CM | POA: Insufficient documentation

## 2021-11-08 NOTE — Assessment & Plan Note (Signed)
Secondary to endocarditis thought to be secondary to a dental infection vs GI infection.  Has follow up with cardiology and vascular.

## 2021-11-08 NOTE — Assessment & Plan Note (Signed)
L left s/p thrombectomy and fasciotomy.  Advised current open wounds to keep clean and dry, can cover w/ Vaseline or Aquaphor.  Vascular will evaluate, appt on 7/10

## 2021-11-08 NOTE — Assessment & Plan Note (Signed)
Amiodarone refilled until pt can get to cardiology appointment

## 2021-11-08 NOTE — Assessment & Plan Note (Signed)
Spironolactone refilled until she can get to cardiology appointment.

## 2021-11-09 ENCOUNTER — Other Ambulatory Visit: Payer: Self-pay | Admitting: Family Medicine

## 2021-11-09 DIAGNOSIS — F411 Generalized anxiety disorder: Secondary | ICD-10-CM

## 2021-11-09 DIAGNOSIS — E782 Mixed hyperlipidemia: Secondary | ICD-10-CM

## 2021-11-09 MED ORDER — SERTRALINE HCL 50 MG PO TABS: 50.0000 mg | ORAL_TABLET | Freq: Every day | ORAL | 1 refills | Status: DC

## 2021-11-09 MED ORDER — METHOCARBAMOL 500 MG PO TABS
500.0000 mg | ORAL_TABLET | Freq: Three times a day (TID) | ORAL | 4 refills | Status: DC | PRN
Start: 2021-11-09 — End: 2022-06-10

## 2021-11-09 MED ORDER — ATORVASTATIN CALCIUM 40 MG PO TABS: 40.0000 mg | ORAL_TABLET | Freq: Every day | ORAL | 4 refills | Status: DC

## 2021-11-12 DIAGNOSIS — I739 Peripheral vascular disease, unspecified: Secondary | ICD-10-CM | POA: Diagnosis not present

## 2021-11-12 DIAGNOSIS — Z09 Encounter for follow-up examination after completed treatment for conditions other than malignant neoplasm: Secondary | ICD-10-CM | POA: Diagnosis not present

## 2021-11-15 ENCOUNTER — Ambulatory Visit: Payer: Medicare Other | Admitting: Family Medicine

## 2021-11-19 ENCOUNTER — Encounter: Payer: Self-pay | Admitting: Physician Assistant

## 2021-11-19 ENCOUNTER — Ambulatory Visit (INDEPENDENT_AMBULATORY_CARE_PROVIDER_SITE_OTHER): Payer: Medicare HMO | Admitting: Physician Assistant

## 2021-11-19 VITALS — BP 111/60 | HR 124 | Temp 98.6°F | Resp 16 | Wt 210.0 lb

## 2021-11-19 DIAGNOSIS — R000 Tachycardia, unspecified: Secondary | ICD-10-CM | POA: Diagnosis not present

## 2021-11-19 DIAGNOSIS — I5020 Unspecified systolic (congestive) heart failure: Secondary | ICD-10-CM | POA: Diagnosis not present

## 2021-11-19 DIAGNOSIS — I10 Essential (primary) hypertension: Secondary | ICD-10-CM | POA: Diagnosis not present

## 2021-11-19 DIAGNOSIS — I480 Paroxysmal atrial fibrillation: Secondary | ICD-10-CM | POA: Diagnosis not present

## 2021-11-19 DIAGNOSIS — Z952 Presence of prosthetic heart valve: Secondary | ICD-10-CM

## 2021-11-19 NOTE — Progress Notes (Unsigned)
I,Sulibeya S Dimas,acting as a Education administrator for [employer], PA-C.,have documented all relevant documentation on the behalf of [name], PA-C,as directed by  [name], PA-C while in the presence of [name], PA-C.   Established patient visit   Patient: Tina Peters   DOB: 27-Feb-1951   71 y.o. Female  MRN: [id number] Visit Date: 11/19/2021  Today's healthcare provider: [name], PA-C   Chief Complaint  Patient presents with   Follow-up   Hypertension   Subjective    HPI  Pt at home Dme cane F/u  8 weeks  Ok to stop sprinoloactone Advised take 50 mg again  Hypertension, follow-up  BP Readings from Last 3 Encounters:  11/19/21 111/60  11/07/21 93/63  09/10/21 120/70   Wt Readings from Last 3 Encounters:  11/19/21 210 lb (95.3 kg)  11/07/21 189 lb 9.6 oz (86 kg)  09/10/21 219 lb 12.8 oz (99.7 kg)     She was last seen for hypertension 3 months ago.  BP at that visit was 96/60. Management since that visit includes continue current medications.  She reports excellent compliance with treatment. She is not having side effects.  She is following a {diet:21022986} diet. She is not exercising. She does not smoke.  Use of agents associated with hypertension: none.   Outside blood pressures are {***enter patient reported home BP readings, or 'not being checked':1}. Symptoms: {Yes/No:20286} chest pain {Yes/No:20286} chest pressure  {Yes/No:20286} palpitations {Yes/No:20286} syncope  {Yes/No:20286} dyspnea {Yes/No:20286} orthopnea  {Yes/No:20286} paroxysmal nocturnal dyspnea {Yes/No:20286} lower extremity edema   Pertinent labs Lab Results  Component Value Date   CHOL 116 08/16/2021   HDL 39 (L) 08/16/2021   LDLCALC 61 08/16/2021   TRIG 82 08/16/2021   CHOLHDL 3.0 08/16/2021   Lab Results  Component Value Date   NA 136 09/10/2021   K 3.3 (L) 09/10/2021   CREATININE 1.01 (H) 09/10/2021   GFRNONAA 60 (L) 09/10/2021   GLUCOSE 159 (H)  09/10/2021   TSH 2.563 03/02/2020     The ASCVD Risk score (Arnett DK, et al., 2019) failed to calculate for the following reasons:   The patient has a prior MI or stroke diagnosis  ---------------------------------------------------------------------------------------------------  Follow up for wound  The patient was last seen for this 2 weeks ago. Changes made at last visit include no changes.  She reports excellent compliance with treatment. She feels that condition is Improved. She is not having side effects.   -----------------------------------------------------------------------------------------   Medications: Outpatient Medications Prior to Visit  Medication Sig   acetaminophen (TYLENOL) 325 MG tablet Take by mouth. 4 tablets as needed   albuterol (VENTOLIN HFA) 108 (90 Base) MCG/ACT inhaler Inhale 2 puffs into the lungs every 6 (six) hours as needed for wheezing or shortness of breath.   amiodarone (PACERONE) 200 MG tablet Take 1 tablet (200 mg total) by mouth daily.   aspirin 81 MG chewable tablet Chew by mouth.   atorvastatin (LIPITOR) 40 MG tablet Take 1 tablet (40 mg total) by mouth daily.   hydrocortisone 2.5 % cream Apply 1 application topically as needed.   methocarbamol (ROBAXIN) 500 MG tablet Take 1 tablet (500 mg total) by mouth every 8 (eight) hours as needed.   metoprolol succinate (TOPROL-XL) 50 MG 24 hr tablet    rivaroxaban (XARELTO) 20 MG TABS tablet Take 1 tablet (20 mg total) by mouth daily with supper. Please schedule office visit before any future refill.   sertraline (ZOLOFT) 50 MG tablet Take 1  tablet (50 mg total) by mouth daily.   spironolactone (ALDACTONE) 25 MG tablet Take 0.5 tablets (12.5 mg total) by mouth daily.   MULTAQ 400 MG tablet TAKE 1 TABLET BY MOUTH  TWICE DAILY WITH A MEAL (Patient not taking: Reported on 11/19/2021)   [DISCONTINUED] acetaminophen (TYLENOL) 650 MG CR tablet Take by mouth. (Patient not taking: Reported on 11/19/2021)    No facility-administered medications prior to visit.    Review of Systems     Objective    BP 111/60 (BP Location: Left Arm, Patient Position: Sitting, Cuff Size: Large)   Pulse (!) 124   Temp 98.6 F (37 C) (Oral)   Resp 16   Wt 210 lb (95.3 kg)   SpO2 99%   BMI 34.95 kg/m  BP Readings from Last 3 Encounters:  11/19/21 111/60  11/07/21 93/63  09/10/21 120/70   Wt Readings from Last 3 Encounters:  11/19/21 210 lb (95.3 kg)  11/07/21 189 lb 9.6 oz (86 kg)  09/10/21 219 lb 12.8 oz (99.7 kg)      Physical Exam  ***  No results found for any visits on 11/19/21.  Assessment & Plan     ***  No follow-ups on file.      I, [name], PA-C have reviewed all documentation for this visit. The documentation on  '@CurDate'$ @  for the exam, diagnosis, procedures, and orders are all accurate and complete.  [name], PA-C [facility] [address] [place], [place], [address] Office: [phone] Fax: [place]

## 2021-11-20 ENCOUNTER — Encounter: Payer: Self-pay | Admitting: Physician Assistant

## 2021-11-20 DIAGNOSIS — I480 Paroxysmal atrial fibrillation: Secondary | ICD-10-CM | POA: Diagnosis not present

## 2021-11-20 DIAGNOSIS — I5020 Unspecified systolic (congestive) heart failure: Secondary | ICD-10-CM | POA: Diagnosis not present

## 2021-11-20 DIAGNOSIS — I5040 Unspecified combined systolic (congestive) and diastolic (congestive) heart failure: Secondary | ICD-10-CM | POA: Diagnosis not present

## 2021-11-20 NOTE — Assessment & Plan Note (Signed)
Euvolemic today. Follow up with cardio as scheduled D/t decreased endurance post op; ordered cane for pt and will coordinate home health for PT for strengthen, mobility w/ new devices.

## 2021-11-20 NOTE — Assessment & Plan Note (Signed)
Incisions healing appropriately. Fu with cardio as scheduled

## 2021-11-20 NOTE — Assessment & Plan Note (Signed)
Soft today. Likely orthostatic hypotension. [place] with d/c spironolactone until cardiology can evaluate

## 2021-11-20 NOTE — Assessment & Plan Note (Signed)
Tachycardic, but regular rhythm continue medications

## 2021-11-20 NOTE — Assessment & Plan Note (Addendum)
Tachy in office but regular rhythm. Advise pt and daughter to restart taking 50 mg of metoprolol daily. Not concerned over HR of 40s when it is asymptomatic-- dizziness is more likely 2/2 orthostatic hypotension

## 2021-11-21 DIAGNOSIS — Z9889 Other specified postprocedural states: Secondary | ICD-10-CM | POA: Diagnosis not present

## 2021-11-21 DIAGNOSIS — R000 Tachycardia, unspecified: Secondary | ICD-10-CM | POA: Diagnosis not present

## 2021-11-21 DIAGNOSIS — I10 Essential (primary) hypertension: Secondary | ICD-10-CM | POA: Diagnosis not present

## 2021-11-21 DIAGNOSIS — Z79899 Other long term (current) drug therapy: Secondary | ICD-10-CM | POA: Diagnosis not present

## 2021-11-21 DIAGNOSIS — E785 Hyperlipidemia, unspecified: Secondary | ICD-10-CM | POA: Diagnosis not present

## 2021-11-21 DIAGNOSIS — Z7982 Long term (current) use of aspirin: Secondary | ICD-10-CM | POA: Diagnosis not present

## 2021-11-21 DIAGNOSIS — I4892 Unspecified atrial flutter: Secondary | ICD-10-CM | POA: Diagnosis not present

## 2021-11-21 DIAGNOSIS — R0602 Shortness of breath: Secondary | ICD-10-CM | POA: Diagnosis not present

## 2021-11-21 DIAGNOSIS — J45909 Unspecified asthma, uncomplicated: Secondary | ICD-10-CM | POA: Diagnosis not present

## 2021-11-21 DIAGNOSIS — Z7901 Long term (current) use of anticoagulants: Secondary | ICD-10-CM | POA: Diagnosis not present

## 2021-11-21 DIAGNOSIS — R0989 Other specified symptoms and signs involving the circulatory and respiratory systems: Secondary | ICD-10-CM | POA: Diagnosis not present

## 2021-11-21 DIAGNOSIS — Z952 Presence of prosthetic heart valve: Secondary | ICD-10-CM | POA: Diagnosis not present

## 2021-11-21 DIAGNOSIS — I059 Rheumatic mitral valve disease, unspecified: Secondary | ICD-10-CM | POA: Diagnosis not present

## 2021-11-21 LAB — CBC WITH DIFFERENTIAL/PLATELET
Basophils Absolute: 0 10*3/uL (ref 0.0–0.2)
Basos: 0 %
EOS (ABSOLUTE): 0.1 10*3/uL (ref 0.0–0.4)
Eos: 1 %
Hematocrit: 37.5 % (ref 34.0–46.6)
Hemoglobin: 11.9 g/dL (ref 11.1–15.9)
Immature Grans (Abs): 0 10*3/uL (ref 0.0–0.1)
Immature Granulocytes: 0 %
Lymphocytes Absolute: 2.6 10*3/uL (ref 0.7–3.1)
Lymphs: 28 %
MCH: 27.4 pg (ref 26.6–33.0)
MCHC: 31.7 g/dL (ref 31.5–35.7)
MCV: 86 fL (ref 79–97)
Monocytes Absolute: 0.7 10*3/uL (ref 0.1–0.9)
Monocytes: 8 %
Neutrophils Absolute: 5.6 10*3/uL (ref 1.4–7.0)
Neutrophils: 63 %
Platelets: 276 10*3/uL (ref 150–450)
RBC: 4.34 x10E6/uL (ref 3.77–5.28)
RDW: 13.9 % (ref 11.7–15.4)
WBC: 9.1 10*3/uL (ref 3.4–10.8)

## 2021-11-21 LAB — COMPREHENSIVE METABOLIC PANEL
ALT: 8 IU/L (ref 0–32)
AST: 20 IU/L (ref 0–40)
Albumin/Globulin Ratio: 1.2 (ref 1.2–2.2)
Albumin: 4.1 g/dL (ref 3.8–4.8)
Alkaline Phosphatase: 105 IU/L (ref 44–121)
BUN/Creatinine Ratio: 18 (ref 12–28)
BUN: 22 mg/dL (ref 8–27)
Bilirubin Total: 0.5 mg/dL (ref 0.0–1.2)
CO2: 21 mmol/L (ref 20–29)
Calcium: 9.2 mg/dL (ref 8.7–10.3)
Chloride: 102 mmol/L (ref 96–106)
Creatinine, Ser: 1.19 mg/dL — ABNORMAL HIGH (ref 0.57–1.00)
Globulin, Total: 3.4 g/dL (ref 1.5–4.5)
Glucose: 108 mg/dL — ABNORMAL HIGH (ref 70–99)
Potassium: 4.3 mmol/L (ref 3.5–5.2)
Sodium: 139 mmol/L (ref 134–144)
Total Protein: 7.5 g/dL (ref 6.0–8.5)
eGFR: 49 mL/min/{1.73_m2} — ABNORMAL LOW (ref >59)

## 2021-11-22 ENCOUNTER — Telehealth: Payer: Self-pay

## 2021-11-22 DIAGNOSIS — I4819 Other persistent atrial fibrillation: Secondary | ICD-10-CM | POA: Diagnosis not present

## 2021-11-22 DIAGNOSIS — Z7902 Long term (current) use of antithrombotics/antiplatelets: Secondary | ICD-10-CM | POA: Diagnosis not present

## 2021-11-22 DIAGNOSIS — Z7901 Long term (current) use of anticoagulants: Secondary | ICD-10-CM | POA: Diagnosis not present

## 2021-11-22 DIAGNOSIS — I4892 Unspecified atrial flutter: Secondary | ICD-10-CM | POA: Diagnosis not present

## 2021-11-22 DIAGNOSIS — Z87891 Personal history of nicotine dependence: Secondary | ICD-10-CM | POA: Diagnosis not present

## 2021-11-22 DIAGNOSIS — Z7982 Long term (current) use of aspirin: Secondary | ICD-10-CM | POA: Diagnosis not present

## 2021-11-22 DIAGNOSIS — E785 Hyperlipidemia, unspecified: Secondary | ICD-10-CM | POA: Diagnosis not present

## 2021-11-22 DIAGNOSIS — I10 Essential (primary) hypertension: Secondary | ICD-10-CM | POA: Diagnosis not present

## 2021-11-22 DIAGNOSIS — Z79899 Other long term (current) drug therapy: Secondary | ICD-10-CM | POA: Diagnosis not present

## 2021-11-22 DIAGNOSIS — I059 Rheumatic mitral valve disease, unspecified: Secondary | ICD-10-CM | POA: Diagnosis not present

## 2021-11-22 DIAGNOSIS — Z952 Presence of prosthetic heart valve: Secondary | ICD-10-CM | POA: Diagnosis not present

## 2021-11-22 NOTE — Telephone Encounter (Signed)
Pt given lab results per notes of Ria Comment, [place] on 11/22/21. Pt and daughter verbalized understanding.

## 2021-11-23 DIAGNOSIS — I4892 Unspecified atrial flutter: Secondary | ICD-10-CM | POA: Diagnosis not present

## 2021-11-26 DIAGNOSIS — Z79899 Other long term (current) drug therapy: Secondary | ICD-10-CM | POA: Diagnosis not present

## 2021-11-26 DIAGNOSIS — I480 Paroxysmal atrial fibrillation: Secondary | ICD-10-CM | POA: Diagnosis not present

## 2021-11-26 DIAGNOSIS — Z7982 Long term (current) use of aspirin: Secondary | ICD-10-CM | POA: Diagnosis not present

## 2021-11-26 DIAGNOSIS — R69 Illness, unspecified: Secondary | ICD-10-CM | POA: Diagnosis not present

## 2021-11-26 DIAGNOSIS — Z87891 Personal history of nicotine dependence: Secondary | ICD-10-CM | POA: Diagnosis not present

## 2021-11-26 DIAGNOSIS — R9431 Abnormal electrocardiogram [ECG] [EKG]: Secondary | ICD-10-CM | POA: Diagnosis not present

## 2021-11-26 DIAGNOSIS — F419 Anxiety disorder, unspecified: Secondary | ICD-10-CM | POA: Diagnosis not present

## 2021-11-26 DIAGNOSIS — I4892 Unspecified atrial flutter: Secondary | ICD-10-CM | POA: Diagnosis not present

## 2021-11-26 DIAGNOSIS — Z7901 Long term (current) use of anticoagulants: Secondary | ICD-10-CM | POA: Diagnosis not present

## 2021-11-26 DIAGNOSIS — I509 Heart failure, unspecified: Secondary | ICD-10-CM | POA: Diagnosis not present

## 2021-11-26 DIAGNOSIS — I110 Hypertensive heart disease with heart failure: Secondary | ICD-10-CM | POA: Diagnosis not present

## 2021-11-26 DIAGNOSIS — I4891 Unspecified atrial fibrillation: Secondary | ICD-10-CM | POA: Diagnosis not present

## 2021-11-26 DIAGNOSIS — R001 Bradycardia, unspecified: Secondary | ICD-10-CM | POA: Diagnosis not present

## 2021-11-26 DIAGNOSIS — E785 Hyperlipidemia, unspecified: Secondary | ICD-10-CM | POA: Diagnosis not present

## 2021-11-27 ENCOUNTER — Other Ambulatory Visit: Payer: Self-pay | Admitting: *Deleted

## 2021-11-27 DIAGNOSIS — Z952 Presence of prosthetic heart valve: Secondary | ICD-10-CM

## 2021-11-29 ENCOUNTER — Other Ambulatory Visit: Payer: Self-pay | Admitting: Physician Assistant

## 2021-11-29 DIAGNOSIS — I482 Chronic atrial fibrillation, unspecified: Secondary | ICD-10-CM

## 2021-11-30 ENCOUNTER — Telehealth: Payer: Self-pay | Admitting: Family Medicine

## 2021-11-30 DIAGNOSIS — Z48812 Encounter for surgical aftercare following surgery on the circulatory system: Secondary | ICD-10-CM | POA: Diagnosis not present

## 2021-11-30 DIAGNOSIS — I5020 Unspecified systolic (congestive) heart failure: Secondary | ICD-10-CM | POA: Diagnosis not present

## 2021-11-30 DIAGNOSIS — Z7951 Long term (current) use of inhaled steroids: Secondary | ICD-10-CM | POA: Diagnosis not present

## 2021-11-30 DIAGNOSIS — I110 Hypertensive heart disease with heart failure: Secondary | ICD-10-CM | POA: Diagnosis not present

## 2021-11-30 DIAGNOSIS — I480 Paroxysmal atrial fibrillation: Secondary | ICD-10-CM | POA: Diagnosis not present

## 2021-11-30 DIAGNOSIS — Z952 Presence of prosthetic heart valve: Secondary | ICD-10-CM | POA: Diagnosis not present

## 2021-11-30 NOTE — Telephone Encounter (Signed)
Home Health Verbal Orders - Caller/Agency: [facility] Number: [phone] vm can be left  Requesting PT Frequency: 1x a week for 5 weeks and  1x a week every 2 weeks for 4 weeks and   Nursing Eval for leg wounds from surgery

## 2021-12-03 NOTE — Telephone Encounter (Signed)
Verbal orders given  

## 2021-12-19 ENCOUNTER — Ambulatory Visit: Payer: Medicare HMO | Admitting: Cardiology

## 2021-12-19 ENCOUNTER — Encounter: Payer: Self-pay | Admitting: Cardiology

## 2021-12-19 ENCOUNTER — Ambulatory Visit (INDEPENDENT_AMBULATORY_CARE_PROVIDER_SITE_OTHER): Payer: Medicare HMO

## 2021-12-19 VITALS — BP 110/74 | HR 61 | Ht 65.0 in | Wt 213.8 lb

## 2021-12-19 DIAGNOSIS — I480 Paroxysmal atrial fibrillation: Secondary | ICD-10-CM | POA: Diagnosis not present

## 2021-12-19 DIAGNOSIS — I482 Chronic atrial fibrillation, unspecified: Secondary | ICD-10-CM

## 2021-12-19 DIAGNOSIS — I38 Endocarditis, valve unspecified: Secondary | ICD-10-CM | POA: Diagnosis not present

## 2021-12-19 DIAGNOSIS — I10 Essential (primary) hypertension: Secondary | ICD-10-CM

## 2021-12-19 DIAGNOSIS — I5020 Unspecified systolic (congestive) heart failure: Secondary | ICD-10-CM

## 2021-12-19 DIAGNOSIS — I493 Ventricular premature depolarization: Secondary | ICD-10-CM

## 2021-12-19 DIAGNOSIS — I471 Supraventricular tachycardia: Secondary | ICD-10-CM

## 2021-12-19 MED ORDER — METOPROLOL SUCCINATE ER 50 MG PO TB24: 50.0000 mg | ORAL_TABLET | Freq: Every day | ORAL | 11 refills | Status: DC

## 2021-12-19 MED ORDER — AMIODARONE HCL 200 MG PO TABS: 200.0000 mg | ORAL_TABLET | Freq: Every day | ORAL | 11 refills | Status: DC

## 2021-12-19 NOTE — Progress Notes (Signed)
Electrophysiology Office Follow up Visit Note:    Date:  12/19/2021   ID:  Tina Peters, DOB 06/16/1950, MRN [id number]  PCP:  Virginia Crews, MD  [facility] Cardiologist:  [name], MD  [facility] Electrophysiologist:  Vickie Epley, MD    Interval History:    Tina Peters is a 71 y.o. female who presents for a follow up visit. They were last seen in clinic June 20, 2021.  She is maintained on amiodarone and Xarelto for her atrial fibrillation.  She had a mitral valve replacement October 11, 2021 because of endocarditis from an infected tooth.  She is recovered very well from the procedure.  Her postop course very early on was complicated by an ischemic left leg requiring fasciotomy but this is healed well.  She is back walking.  She has been participating in rehab for the leg and is anticipating enrolling in cardiac rehab soon.  She is done well after cardioversion postop.  They did do a maze procedure at the same time as the mitral valve surgery.  She is with family today in clinic.       Past Medical History:  Diagnosis Date   Allergy    Anxiety    Arthritis    Asthma    Chronic back pain    Depression    Diastolic dysfunction    a. 02/2020 Echo: EF 60-65%, no rwma. Gr2 DD. Nl RV size/fxn. Mild LAE. Mild MR.   Diverticulitis    Hyperlipidemia    Hypertension    Lipoma    Mild mitral regurgitation    a. 08/2016 Echo: mild MR; b. 02/2020 Echo: Mild MR.   Neuromuscular disorder (HCC)    Obesity    Obstructive sleep apnea    PAF (paroxysmal atrial fibrillation) ([place])    a. Dx 05/2016--> Xarelto (CHA2DS2VASc = 3); b. 08/2016 Echo: EF 55-60%, no rwma, mild MR, mod dil LA; c. 03/2020 Zio: Avg HR 61 w/ Afib burden of 24% - max rate 189 (avg 93). 2 pauses - longest 4.7 secs post-conversion pauses (occurred @ 5:42 am).   Paroxysmal SVT (supraventricular tachycardia) (HCC)    Sleep apnea    Stroke [facility])    a. 02/2020 MRI/A: multifocal acute ischemia in R  MCA territory predominantly involving post insula and R middle frontal gyrus. Nl MRA.   Tachy-brady syndrome ([place])    Tobacco abuse     Past Surgical History:  Procedure Laterality Date   ACHILLES TENDON SURGERY Left    BACK SURGERY     2 ruptured discs in L spine, no hardware   KNEE ARTHROSCOPY W/ MENISCAL REPAIR Right    in the 1990s   SALPINGOOPHORECTOMY Bilateral    TUMOR REMOVAL     neck, ?lipoma    Current Medications: Current Meds  Medication Sig   acetaminophen (TYLENOL) 325 MG tablet Take by mouth. 4 tablets as needed   albuterol (VENTOLIN HFA) 108 (90 Base) MCG/ACT inhaler Inhale 2 puffs into the lungs every 6 (six) hours as needed for wheezing or shortness of breath.   amiodarone (PACERONE) 200 MG tablet Take 1 tablet (200 mg total) by mouth daily.   aspirin 81 MG chewable tablet Chew by mouth.   atorvastatin (LIPITOR) 40 MG tablet Take 1 tablet (40 mg total) by mouth daily.   hydrocortisone 2.5 % cream Apply 1 application topically as needed.   methocarbamol (ROBAXIN) 500 MG tablet Take 1 tablet (500 mg total) by mouth every 8 (  eight) hours as needed.   metoprolol succinate (TOPROL-XL) 50 MG 24 hr tablet    rivaroxaban (XARELTO) 20 MG TABS tablet Take 1 tablet (20 mg total) by mouth daily with supper. Please schedule office visit before any future refill.   sertraline (ZOLOFT) 50 MG tablet Take 1 tablet (50 mg total) by mouth daily.   spironolactone (ALDACTONE) 25 MG tablet Take 0.5 tablets (12.5 mg total) by mouth daily.     Allergies:   Patient has no known allergies.   Social History   Socioeconomic History   Marital status: Divorced    Spouse name: Not on file   Number of children: 2   Years of education: Not on file   Highest education level: Not on file  Occupational History   Occupation: retired  Tobacco Use   Smoking status: Former    Packs/day: 0.25    Years: 40.00    Total pack years: 10.00    Types: Cigarettes    Quit date: 2015    Years  since quitting: 8.6   Smokeless tobacco: Never   Tobacco comments:    Used to smoke heavily, cut back in 2015  Vaping Use   Vaping Use: Never used  Substance and Sexual Activity   Alcohol use: Yes    Comment: socially, rare intake   Drug use: No   Sexual activity: Yes    Partners: Male    Birth control/protection: Post-menopausal  Other Topics Concern   Not on file  Social History Narrative   Lives alone.   Social Determinants of Health   Financial Resource Strain: Not on file  Food Insecurity: Not on file  Transportation Needs: Not on file  Physical Activity: Not on file  Stress: Not on file  Social Connections: Not on file     Family History: The patient's family history includes Alcoholism in her father; CAD in her father; Diabetes in her mother; Diabetes Mellitus I in her grandson; Fibrocystic breast disease in her mother and sister; Stroke in her mother. There is no history of Breast cancer or Colon cancer.  ROS:   Please see the history of present illness.    All other systems reviewed and are negative.  EKGs/Labs/Other Studies Reviewed:    The following studies were reviewed today:    EKG:  The ekg ordered today demonstrates sinus rhythm with bigeminal PVCs.  PVCs have a right bundle morphology in V1 and a left superior axis.  Recent Labs: 11/20/2021: ALT 8; BUN 22; Creatinine, Ser 1.19; Hemoglobin 11.9; Platelets 276; Potassium 4.3; Sodium 139  Recent Lipid Panel    Component Value Date/Time   CHOL 116 08/16/2021 1103   TRIG 82 08/16/2021 1103   HDL 39 (L) 08/16/2021 1103   CHOLHDL 3.0 08/16/2021 1103   CHOLHDL 5.1 03/02/2020 0445   VLDL 18 03/02/2020 0445   LDLCALC 61 08/16/2021 1103    Physical Exam:    VS:  BP 110/74   Pulse 61   Ht '5\' 5"'$  (1.651 m)   Wt 213 lb 12.8 oz (97 kg)   SpO2 95%   BMI 35.58 kg/m     Wt Readings from Last 3 Encounters:  12/19/21 213 lb 12.8 oz (97 kg)  11/19/21 210 lb (95.3 kg)  11/07/21 189 lb 9.6 oz (86 kg)      GEN:  Well nourished, well developed in no acute distress HEENT: Normal NECK: No JVD; No carotid bruits LYMPHATICS: No lymphadenopathy CARDIAC: RRR, no murmurs, rubs, gallops.  Sternotomy  incision healing well RESPIRATORY:  Clear to auscultation without rales, wheezing or rhonchi  ABDOMEN: Soft, non-tender, non-distended MUSCULOSKELETAL: 1+ pitting edema in the left lower extremity.  Fasciotomy incisions are healing.  Medial incision with some superficial scabbing still noted. SKIN: Warm and dry NEUROLOGIC:  Alert and oriented x 3 PSYCHIATRIC:  Normal affect        ASSESSMENT:    1. AF (paroxysmal atrial fibrillation) (HCC)   2. HFrEF (heart failure with reduced ejection fraction) (HCC)   3. Paroxysmal SVT (supraventricular tachycardia) ([place])   4. Primary hypertension   5. PVC (premature ventricular contraction)   6. Endocarditis, unspecified chronicity, unspecified endocarditis type   7. Chronic a-fib (HCC)    PLAN:    In order of problems listed above:  #Endocarditis Mitral valve replacement in June 2023 at [facility].  Is doing remarkably well after the surgery.  #Atrial fibrillation Maintaining sinus rhythm on amiodarone.  Received Maze procedure during June 2023 mitral valve replacement.  On Xarelto for stroke prophylaxis.  We did discuss the proper usage of Xarelto (taking it with the largest meal the day).  #PVCs Bigeminal today.  Last few EKGs have not demonstrated PVCs.  I will order a 7-day monitor to quantify.  I suspect this is secondary to inflammation/trauma during mitral valve replacement.  #Hypertension At goal today.  Recommend checking blood pressures 1-2 times per week at home and recording the values.  Recommend bringing these recordings to the primary care physician.  I will put a recall in for Dr. Fletcher Anon to help manage her mitral valve replacement.  She should see Dr. Fletcher Anon sometime in the next few months.  Follow-up 6 months with me or sooner based on  the heart monitor results.  She will need blood work at the follow-up appointment.   Medication Adjustments/Labs and Tests Ordered: Current medicines are reviewed at length with the patient today.  Concerns regarding medicines are outlined above.  Orders Placed This Encounter  Procedures   EKG 12-Lead   No orders of the defined types were placed in this encounter.    Signed, [name], MD, [facility], [facility] 12/19/2021 11:03 AM    Electrophysiology [facility]

## 2021-12-19 NOTE — Patient Instructions (Signed)
Medication Instructions:  none *If you need a refill on your cardiac medications before your next appointment, please call your pharmacy*   Lab Work: none If you have labs (blood work) drawn today and your tests are completely normal, you will receive your results only by: [place] (if you have MyChart) OR A paper copy in the mail If you have any lab test that is abnormal or we need to change your treatment, we will call you to review the results.   Testing/Procedures: Your physician has recommended that you wear a heart monitor. Heart monitors are medical devices that record the heart's electrical activity. Doctors most often use these monitors to diagnose arrhythmias. Arrhythmias are problems with the speed or rhythm of the heartbeat. The monitor is a small, portable device. You can wear one while you do your normal daily activities. This is usually used to diagnose what is causing palpitations/syncope (passing out).    Follow-Up: At [facility], you and your health needs are our priority.  As part of our continuing mission to provide you with exceptional heart care, we have created designated Provider Care Teams.  These Care Teams include your primary Cardiologist (physician) and Advanced Practice Providers (APPs -  Physician Assistants and Nurse Practitioners) who all work together to provide you with the care you need, when you need it.  We recommend signing up for the patient portal called "MyChart".  Sign up information is provided on this After Visit Summary.  MyChart is used to connect with patients for Virtual Visits (Telemedicine).  Patients are able to view lab/test results, encounter notes, upcoming appointments, etc.  Non-urgent messages can be sent to your provider as well.   To learn more about what you can do with MyChart, go to [email or web address].    Your next appointment:   3 month(s)  The format for your next appointment:   In Person  Provider:    [name], MD{  6 months with Dr. Quentin Ore.   Other Instructions ZIO XT- Long Term Monitor Instructions  Your physician has requested you wear a ZIO patch monitor for 7 days.  This is a single patch monitor. [employer] supplies one patch monitor per enrollment. Additional stickers are not available. Please do not apply patch if you will be having a Nuclear Stress Test,  Echocardiogram, Cardiac CT, MRI, or Chest Xray during the period you would be wearing the  monitor. The patch cannot be worn during these tests. You cannot remove and re-apply the  ZIO XT patch monitor.  Your ZIO patch monitor will be mailed 3 day USPS to your address on file. It may take 3-5 days  to receive your monitor after you have been enrolled.  Once you have received your monitor, please review the enclosed instructions. Your monitor  has already been registered assigning a specific monitor serial # to you.  Billing and Patient Assistance Program Information  We have supplied [employer] with any of your insurance information on file for billing purposes. [employer] offers a sliding scale Patient Assistance Program for patients that do not have  insurance, or whose insurance does not completely cover the cost of the ZIO monitor.  You must apply for the Patient Assistance Program to qualify for this discounted rate.  To apply, please call [employer] at [phone], select option 4, select option 2, ask to apply for  Patient Assistance Program. Theodore Demark will ask your household income, and how many people  are in your household. They will quote your out-of-pocket  cost based on that information.  [employer] will also be able to set up a 69-month interest-free payment plan if needed.  Applying the monitor   Shave hair from upper left chest.  Hold abrader disc by orange tab. Rub abrader in 40 strokes over the upper left chest as  indicated in your monitor instructions.  Clean area with 4 enclosed alcohol pads. Let dry.   Apply patch as indicated in monitor instructions. Patch will be placed under collarbone on left  side of chest with arrow pointing upward.  Rub patch adhesive wings for 2 minutes. Remove white label marked "1". Remove the white  label marked "2". Rub patch adhesive wings for 2 additional minutes.  While looking in a mirror, press and release button in center of patch. A small green light will  flash 3-4 times. This will be your only indicator that the monitor has been turned on.  Do not shower for the first 24 hours. You may shower after the first 24 hours.  Press the button if you feel a symptom. You will hear a small click. Record Date, Time and  Symptom in the Patient Logbook.  When you are ready to remove the patch, follow instructions on the last 2 pages of Patient  Logbook. Stick patch monitor onto the last page of Patient Logbook.  Place Patient Logbook in the blue and white box. Use locking tab on box and tape box closed  securely. The blue and white box has prepaid postage on it. Please place it in the mailbox as  soon as possible. Your physician should have your test results approximately 7 days after the  monitor has been mailed back to [facility]  Call IGlenoldenat [phone] you have questions regarding  your ZIO XT patch monitor. Call them immediately if you see an orange light blinking on your  monitor.  If your monitor falls off in less than 4 days, contact our Monitor department at [phone]  If your monitor becomes loose or falls off after 4 days call [employer] at [phone]  suggestions on securing your monitor   Important Information About [place]

## 2021-12-24 DIAGNOSIS — E785 Hyperlipidemia, unspecified: Secondary | ICD-10-CM | POA: Diagnosis not present

## 2021-12-24 DIAGNOSIS — I340 Nonrheumatic mitral (valve) insufficiency: Secondary | ICD-10-CM | POA: Diagnosis not present

## 2021-12-24 DIAGNOSIS — I739 Peripheral vascular disease, unspecified: Secondary | ICD-10-CM | POA: Diagnosis not present

## 2021-12-24 DIAGNOSIS — I10 Essential (primary) hypertension: Secondary | ICD-10-CM | POA: Diagnosis not present

## 2021-12-24 DIAGNOSIS — Z4889 Encounter for other specified surgical aftercare: Secondary | ICD-10-CM | POA: Diagnosis not present

## 2021-12-26 DIAGNOSIS — I480 Paroxysmal atrial fibrillation: Secondary | ICD-10-CM | POA: Diagnosis not present

## 2021-12-26 DIAGNOSIS — I38 Endocarditis, valve unspecified: Secondary | ICD-10-CM

## 2021-12-26 DIAGNOSIS — I493 Ventricular premature depolarization: Secondary | ICD-10-CM | POA: Diagnosis not present

## 2021-12-26 DIAGNOSIS — I10 Essential (primary) hypertension: Secondary | ICD-10-CM

## 2021-12-26 DIAGNOSIS — I5020 Unspecified systolic (congestive) heart failure: Secondary | ICD-10-CM

## 2021-12-26 DIAGNOSIS — I471 Supraventricular tachycardia: Secondary | ICD-10-CM | POA: Diagnosis not present

## 2021-12-26 DIAGNOSIS — I482 Chronic atrial fibrillation, unspecified: Secondary | ICD-10-CM | POA: Diagnosis not present

## 2021-12-28 ENCOUNTER — Other Ambulatory Visit: Payer: Self-pay | Admitting: Physician Assistant

## 2021-12-28 DIAGNOSIS — I5020 Unspecified systolic (congestive) heart failure: Secondary | ICD-10-CM

## 2022-01-08 ENCOUNTER — Ambulatory Visit (INDEPENDENT_AMBULATORY_CARE_PROVIDER_SITE_OTHER): Payer: Medicare HMO | Admitting: Physician Assistant

## 2022-01-08 ENCOUNTER — Encounter: Payer: Self-pay | Admitting: Physician Assistant

## 2022-01-08 VITALS — BP 145/61 | HR 55 | Wt 210.0 lb

## 2022-01-08 DIAGNOSIS — Z952 Presence of prosthetic heart valve: Secondary | ICD-10-CM | POA: Diagnosis not present

## 2022-01-08 DIAGNOSIS — G629 Polyneuropathy, unspecified: Secondary | ICD-10-CM

## 2022-01-08 DIAGNOSIS — I10 Essential (primary) hypertension: Secondary | ICD-10-CM | POA: Diagnosis not present

## 2022-01-08 DIAGNOSIS — F411 Generalized anxiety disorder: Secondary | ICD-10-CM

## 2022-01-08 DIAGNOSIS — I480 Paroxysmal atrial fibrillation: Secondary | ICD-10-CM

## 2022-01-08 DIAGNOSIS — M792 Neuralgia and neuritis, unspecified: Secondary | ICD-10-CM

## 2022-01-08 DIAGNOSIS — R69 Illness, unspecified: Secondary | ICD-10-CM | POA: Diagnosis not present

## 2022-01-08 MED ORDER — RIVAROXABAN 20 MG PO TABS: 20.0000 mg | ORAL_TABLET | Freq: Every day | ORAL | 1 refills | Status: DC

## 2022-01-08 MED ORDER — GABAPENTIN 100 MG PO CAPS: 100.0000 mg | ORAL_CAPSULE | Freq: Every evening | ORAL | 0 refills | Status: DC | PRN

## 2022-01-08 NOTE — Progress Notes (Unsigned)
      I,Sha'taria Tyson,acting as a Education administrator for [employer], PA-C.,have documented all relevant documentation on the behalf of [name], PA-C,as directed by  [name], PA-C while in the presence of [name], PA-C.  Established patient visit   Patient: Tina Peters   DOB: 1950/11/20   71 y.o. Female  MRN: [id number] Visit Date: 01/08/2022  Today's healthcare provider: [name], PA-C   Cc. Anxiety, chronic care  Subjective    HPI   Anxiety, Follow-up  She was last seen for anxiety 5 months ago. Changes made at last visit include continue current medications.   She reports excellent compliance with treatment. She reports excellent tolerance of treatment. She is not having side effects.   She feels her anxiety is mild and Improved since last visit. 145/61  Symptoms: No chest pain No difficulty concentrating  No dizziness No fatigue  No feelings of losing control No insomnia  No irritable No palpitations  No panic attacks No racing thoughts  No shortness of breath No sweating  Yes tremors/shakes    GAD-7 Results     No data to display          PHQ-9 Scores    09/06/2021    2:35 PM 08/16/2021   10:26 AM  PHQ9 SCORE ONLY  PHQ-9 Total Score 6 12    ---------------------------------------------------------------------------------------------------   Medications: Outpatient Medications Prior to Visit  Medication Sig   acetaminophen (TYLENOL) 325 MG tablet Take by mouth. 4 tablets as needed   albuterol (VENTOLIN HFA) 108 (90 Base) MCG/ACT inhaler Inhale 2 puffs into the lungs every 6 (six) hours as needed for wheezing or shortness of breath.   amiodarone (PACERONE) 200 MG tablet Take 1 tablet (200 mg total) by mouth daily.   aspirin 81 MG chewable tablet Chew by mouth.   atorvastatin (LIPITOR) 40 MG tablet Take 1 tablet (40 mg total) by mouth daily.   hydrocortisone 2.5 % cream Apply 1 application topically as needed.   methocarbamol  (ROBAXIN) 500 MG tablet Take 1 tablet (500 mg total) by mouth every 8 (eight) hours as needed.   metoprolol succinate (TOPROL-XL) 50 MG 24 hr tablet Take 1 tablet (50 mg total) by mouth daily.   rivaroxaban (XARELTO) 20 MG TABS tablet Take 1 tablet (20 mg total) by mouth daily with supper. Please schedule office visit before any future refill.   sertraline (ZOLOFT) 50 MG tablet Take 1 tablet (50 mg total) by mouth daily.   spironolactone (ALDACTONE) 25 MG tablet Take 0.5 tablets (12.5 mg total) by mouth daily.   No facility-administered medications prior to visit.    Review of Systems     Objective    Blood pressure (!) 146/57, pulse (!) 55, weight 210 lb (95.3 kg).   Physical Exam  ***  No results found for any visits on 01/08/22.  Assessment & Plan     ***  No follow-ups on file.      I, [name], PA-C have reviewed all documentation for this visit. The documentation on  01/08/2022 for the exam, diagnosis, procedures, and orders are all accurate and complete.[name], PA-C [facility] [address] [place], [place], [address] Office: [phone] Fax: [place]

## 2022-01-09 ENCOUNTER — Encounter: Payer: Self-pay | Admitting: Physician Assistant

## 2022-01-09 DIAGNOSIS — R609 Edema, unspecified: Secondary | ICD-10-CM | POA: Insufficient documentation

## 2022-01-09 DIAGNOSIS — M792 Neuralgia and neuritis, unspecified: Secondary | ICD-10-CM | POA: Insufficient documentation

## 2022-01-09 NOTE — Assessment & Plan Note (Signed)
Slightly protuberant area superior to surgical site consistent with post op seroma? No concern for infection Advised pt to monitor

## 2022-01-09 NOTE — Assessment & Plan Note (Signed)
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  As pain significantly affects sleep-- trial of gabapentin 100 mg but encouraged pt to listen to vascular surgeon's recommendations and that healing is slow

## 2022-01-09 NOTE — Assessment & Plan Note (Signed)
Well controlled now, flared during hospital stay and post op period, manages with zoloft

## 2022-01-09 NOTE — Assessment & Plan Note (Signed)
Normal rate/rhythm today On anticoagulation therapy

## 2022-01-09 NOTE — Assessment & Plan Note (Signed)
Moderately controlled will monitor caution for orthostatic hypotension Can consider tighter control when pt is back to normal strength

## 2022-01-16 DIAGNOSIS — G4733 Obstructive sleep apnea (adult) (pediatric): Secondary | ICD-10-CM | POA: Diagnosis not present

## 2022-01-16 DIAGNOSIS — I10 Essential (primary) hypertension: Secondary | ICD-10-CM | POA: Diagnosis not present

## 2022-01-21 DIAGNOSIS — J45909 Unspecified asthma, uncomplicated: Secondary | ICD-10-CM | POA: Insufficient documentation

## 2022-01-22 ENCOUNTER — Ambulatory Visit (INDEPENDENT_AMBULATORY_CARE_PROVIDER_SITE_OTHER): Payer: Medicare HMO

## 2022-01-22 VITALS — BP 122/80 | Ht 65.0 in | Wt 213.5 lb

## 2022-01-22 DIAGNOSIS — Z0000 Encounter for general adult medical examination without abnormal findings: Secondary | ICD-10-CM | POA: Diagnosis not present

## 2022-01-22 NOTE — Progress Notes (Signed)
Subjective:   NOHELANI BENNING is a 71 y.o. female who presents for Medicare Annual (Subsequent) preventive examination.  Review of Systems     Cardiac Risk Factors include: advanced age (>22mn, >>37women);hypertension     Objective:    Today's Vitals   01/22/22 1602  BP: 122/80  Weight: 213 lb 8 oz (96.8 kg)  Height: '5\' 5"'$  (1.651 m)   Body mass index is 35.53 kg/m.     01/22/2022    4:08 PM 09/10/2021    1:41 PM 03/01/2020    3:46 PM 07/19/2018    1:08 PM 11/02/2016    9:14 PM 05/31/2016    9:03 PM  Advanced Directives  Does Patient Have a Medical Advance Directive? No No No No No No  Would patient like information on creating a medical advance directive? No - Patient declined No - Patient declined  No - Patient declined  Yes (Inpatient - patient requests chaplain consult to create a medical advance directive)    Current Medications (verified) Outpatient Encounter Medications as of 01/22/2022  Medication Sig   acetaminophen (TYLENOL) 325 MG tablet Take by mouth. 4 tablets as needed   albuterol (VENTOLIN HFA) 108 (90 Base) MCG/ACT inhaler Inhale 2 puffs into the lungs every 6 (six) hours as needed for wheezing or shortness of breath.   amiodarone (PACERONE) 200 MG tablet Take 1 tablet (200 mg total) by mouth daily.   aspirin 81 MG chewable tablet Chew by mouth.   atorvastatin (LIPITOR) 40 MG tablet Take 1 tablet (40 mg total) by mouth daily.   gabapentin (NEURONTIN) 100 MG capsule Take 1 capsule (100 mg total) by mouth at bedtime as needed.   hydrocortisone 2.5 % cream Apply 1 application topically as needed.   methocarbamol (ROBAXIN) 500 MG tablet Take 1 tablet (500 mg total) by mouth every 8 (eight) hours as needed.   metoprolol succinate (TOPROL-XL) 50 MG 24 hr tablet Take 1 tablet (50 mg total) by mouth daily.   rivaroxaban (XARELTO) 20 MG TABS tablet Take 1 tablet (20 mg total) by mouth daily with supper. Take with dinner or largest meal of the day   sertraline (ZOLOFT)  50 MG tablet Take 1 tablet (50 mg total) by mouth daily.   spironolactone (ALDACTONE) 25 MG tablet Take 0.5 tablets (12.5 mg total) by mouth daily.   No facility-administered encounter medications on file as of 01/22/2022.    Allergies (verified) Patient has no known allergies.   History: Past Medical History:  Diagnosis Date   Allergy    Anxiety    Arthritis    Asthma    Chronic back pain    Depression    Diastolic dysfunction    a. 02/2020 Echo: EF 60-65%, no rwma. Gr2 DD. Nl RV size/fxn. Mild LAE. Mild MR.   Diverticulitis    Hyperlipidemia    Hypertension    Lipoma    Mild mitral regurgitation    a. 08/2016 Echo: mild MR; b. 02/2020 Echo: Mild MR.   Neuromuscular disorder (HCC)    Obesity    Obstructive sleep apnea    PAF (paroxysmal atrial fibrillation) ([place]    a. Dx 05/2016--> Xarelto (CHA2DS2VASc = 3); b. 08/2016 Echo: EF 55-60%, no rwma, mild MR, mod dil LA; c. 03/2020 Zio: Avg HR 61 w/ Afib burden of 24% - max rate 189 (avg 93). 2 pauses - longest 4.7 secs post-conversion pauses (occurred @ 5:42 am).   Paroxysmal SVT (supra[place]ricular tachycardia) ([place]    Sleep  apnea    Stroke [facility])    a. 02/2020 MRI/A: multifocal acute ischemia in R MCA territory predominantly involving post insula and R middle frontal gyrus. Nl MRA.   Tachy-brady syndrome (HCC)    Tobacco abuse    Past Surgical History:  Procedure Laterality Date   ACHILLES TENDON SURGERY Left    BACK SURGERY     2 ruptured discs in L spine, no hardware   KNEE ARTHROSCOPY W/ MENISCAL REPAIR Right    in the 1990s   SALPINGOOPHORECTOMY Bilateral    TUMOR REMOVAL     neck, ?lipoma   Family History  Problem Relation Age of Onset   Stroke Mother    Diabetes Mother    Fibrocystic breast disease Mother    CAD Father    Alcoholism Father    Fibrocystic breast disease Sister    Diabetes Mellitus I Grandson    Breast cancer Neg Hx    Colon cancer Neg Hx    Social History   Socioeconomic History   Marital  status: Divorced    Spouse name: Not on file   Number of children: 2   Years of education: Not on file   Highest education level: Not on file  Occupational History   Occupation: retired  Tobacco Use   Smoking status: Former    Packs/day: 0.25    Years: 40.00    Total pack years: 10.00    Types: Cigarettes    Quit date: 2015    Years since quitting: 8.7   Smokeless tobacco: Never   Tobacco comments:    Used to smoke heavily, cut back in 2015  Vaping Use   Vaping Use: Never used  Substance and Sexual Activity   Alcohol use: Yes    Comment: socially, rare intake   Drug use: No   Sexual activity: Yes    Partners: Male    Birth control/protection: Post-menopausal  Other Topics Concern   Not on file  Social History Narrative   Lives alone.   Social Determinants of Health   Financial Resource Strain: Low Risk  (01/22/2022)   Overall Financial Resource Strain (CARDIA)    Difficulty of Paying Living Expenses: Not very hard  Food Insecurity: No Food Insecurity (01/22/2022)   Hunger Vital Sign    Worried About Running Out of Food in the Last Year: Never true    Ran Out of Food in the Last Year: Never true  Transportation Needs: No Transportation Needs (01/22/2022)   PRAPARE - Hydrologist (Medical): No    Lack of Transportation (Non-Medical): No  Physical Activity: Sufficiently Active (01/22/2022)   Exercise Vital Sign    Days of Exercise per Week: 4 days    Minutes of Exercise per Session: 40 min  Stress: No Stress Concern Present (01/22/2022)   [place]    Feeling of Stress : Only a little  Social Connections: Socially Isolated (01/22/2022)   Social Connection and Isolation Panel [NHANES]    Frequency of Communication with Friends and Family: More than three times a week    Frequency of Social Gatherings with Friends and Family: Once a week    Attends Religious Services: Never     Marine scientist or Organizations: No    Attends Archivist Meetings: Never    Marital Status: Divorced    Tobacco Counseling Counseling given: Not Answered Tobacco comments: Used to smoke heavily, cut back in  2015   Clinical Intake:  Pre-visit preparation completed: Yes  Pain : No/denies pain     Nutritional Risks: None Diabetes: No  How often do you need to have someone help you when you read instructions, pamphlets, or other written materials from your doctor or pharmacy?: 1 - Never  Diabetic?no  Interpreter Needed?: No  Information entered by :: [name] LPN   Activities of Daily Living    01/22/2022    4:09 PM 01/08/2022    4:15 PM  In your present state of health, do you have any difficulty performing the following activities:  Hearing? 1 1  Vision? 1 1  Difficulty concentrating or making decisions? 0 0  Walking or climbing stairs? 0 0  Dressing or bathing? 0 0  Doing errands, shopping? 0 0  Preparing Food and eating ? N   Using the Toilet? N   In the past six months, have you accidently leaked urine? N   Do you have problems with loss of bowel control? N   Managing your Medications? N   Managing your Finances? N   Housekeeping or managing your Housekeeping? N     Patient Care Team: Virginia Crews, MD as PCP - General (Family Medicine) Wellington Hampshire, MD as PCP - Cardiology (Cardiology) Vickie Epley, MD as PCP - Electrophysiology (Cardiology) Vladimir Crofts, MD as Consulting Physician (Neurology)  Indicate any recent Medical Services you may have received from other than [facility] providers in the past year (date may be approximate).     Assessment:   This is a routine wellness examination for Shayanne.  Hearing/Vision screen Hearing Screening - Comments:: No aids Vision Screening - Comments:: Readers-   Dietary issues and exercise activities discussed: Current Exercise Habits: Home exercise routine, Type of  exercise: walking, Time (Minutes): 40, Frequency (Times/Week): 4, Weekly Exercise (Minutes/Week): 160, Intensity: Mild   Goals Addressed             This Visit's Progress    DIET - EAT MORE FRUITS AND VEGETABLES         Depression Screen    01/22/2022    4:06 PM 01/08/2022    4:15 PM 09/06/2021    2:35 PM 08/16/2021   10:26 AM  PHQ 2/9 Scores  PHQ - 2 Score 0 0 0 2  PHQ- 9 Score 0 '1 6 12    '$ Fall Risk    01/22/2022    4:08 PM 09/06/2021    2:35 PM 08/16/2021   10:26 AM  Fall Risk   Falls in the past year? 0 0 0  Number falls in past yr: 0 0 0  Injury with Fall? 0 0 0  Risk for fall due to : No Fall Risks  No Fall Risks  Follow up Falls prevention discussed;Falls evaluation completed  Falls evaluation completed    FALL RISK PREVENTION PERTAINING TO THE HOME:  Any stairs in or around the home? Yes  If so, are there any without handrails? No  Home free of loose throw rugs in walkways, pet beds, electrical cords, etc? Yes  Adequate lighting in your home to reduce risk of falls? Yes   ASSISTIVE DEVICES UTILIZED TO PREVENT FALLS:  Life alert? No  Use of a cane, walker or w/c? No  Grab bars in the bathroom? No  Shower chair or bench in shower? Yes  Elevated toilet seat or a handicapped toilet? No   TIMED UP AND GO:  Was the test performed? Yes .  Length of time to ambulate 10 feet: 4 sec.   Gait steady and fast without use of assistive device  Cognitive Function:        01/22/2022    4:12 PM  6CIT Screen  What Year? 0 points  What month? 0 points  What time? 0 points  Count back from 20 0 points  Months in reverse 0 points  Repeat phrase 0 points  Total Score 0 points    Immunizations Immunization History  Administered Date(s) Administered   Pneumococcal Polysaccharide-23 01/19/2020   Tdap 09/25/2017    TDAP status: Up to date  Flu Vaccine status: Declined, Education has been provided regarding the importance of this vaccine but patient still  declined. Advised may receive this vaccine at local pharmacy or Health Dept. Aware to provide a copy of the vaccination record if obtained from local pharmacy or Health Dept. Verbalized acceptance and understanding.  Pneumococcal vaccine status: Declined,  Education has been provided regarding the importance of this vaccine but patient still declined. Advised may receive this vaccine at local pharmacy or Health Dept. Aware to provide a copy of the vaccination record if obtained from local pharmacy or Health Dept. Verbalized acceptance and understanding.   Covid-19 vaccine status: Declined, Education has been provided regarding the importance of this vaccine but patient still declined. Advised may receive this vaccine at local pharmacy or Health Dept.or vaccine clinic. Aware to provide a copy of the vaccination record if obtained from local pharmacy or Health Dept. Verbalized acceptance and understanding.  Qualifies for Shingles Vaccine? Yes   Zostavax completed No   Shingrix Completed?: No.    Education has been provided regarding the importance of this vaccine. Patient has been advised to call insurance company to determine out of pocket expense if they have not yet received this vaccine. Advised may also receive vaccine at local pharmacy or Health Dept. Verbalized acceptance and understanding.  Screening Tests Health Maintenance  Topic Date Due   COVID-19 Vaccine (1) Never done   Zoster Vaccines- Shingrix (1 of 2) Never done   COLONOSCOPY (Pts 45-48yr Insurance coverage will need to be confirmed)  Never done   MAMMOGRAM  Never done   Pneumonia Vaccine 71 Years old (2 - PCV) 01/18/2021   INFLUENZA VACCINE  Never done   TETANUS/TDAP  09/26/2027   DEXA SCAN  Completed   Hepatitis C Screening  Completed   HPV VACCINES  Aged Out    Health Maintenance  Health Maintenance Due  Topic Date Due   COVID-19 Vaccine (1) Never done   Zoster Vaccines- Shingrix (1 of 2) Never done   COLONOSCOPY  (Pts 45-463yrInsurance coverage will need to be confirmed)  Never done   MAMMOGRAM  Never done   Pneumonia Vaccine 6541Years old (2 - PCV) 01/18/2021   INFLUENZA VACCINE  Never done    Colorectal cancer screening: Referral to GI placed today. Pt aware the office will call re: appt.  Mammogram status: Ordered 08/16/21. Pt provided with contact info and advised to call to schedule appt.   Bone Density status: Completed 10/21/17. Results reflect: Bone density results: OSTEOPENIA. Repeat every 5 years.  Lung Cancer Screening: (Low Dose CT Chest recommended if Age 71-80ears, 30 pack-year currently smoking OR have quit w/in 15years.) does not qualify.   Additional Screening:  Hepatitis C Screening: does qualify; Completed 08/16/21  Vision Screening: Recommended annual ophthalmology exams for early detection of glaucoma and other disorders of the eye. Is the patient up  to date with their annual eye exam?  No  Who is the provider or what is the name of the office in which the patient attends annual eye exams? No one If pt is not established with a provider, would they like to be referred to a provider to establish care? No .   Dental Screening: Recommended annual dental exams for proper oral hygiene  Community Resource Referral / Chronic Care Management: CRR required this visit?  No   CCM required this visit?  No      Plan:     I have personally reviewed and noted the following in the patient's chart:   Medical and social history Use of alcohol, tobacco or illicit drugs  Current medications and supplements including opioid prescriptions. Patient is not currently taking opioid prescriptions. Functional ability and status Nutritional status Physical activity Advanced directives List of other physicians Hospitalizations, surgeries, and ER visits in previous 12 months Vitals Screenings to include cognitive, depression, and falls Referrals and appointments  In addition, I have  reviewed and discussed with patient certain preventive protocols, quality metrics, and best practice recommendations. A written personalized care plan for preventive services as well as general preventive health recommendations were provided to patient.     Dionisio David, LPN   7/56/4332   Nurse Notes: none

## 2022-01-22 NOTE — Patient Instructions (Signed)
Tina Peters , Thank you for taking time to come for your Medicare Wellness Visit. I appreciate your ongoing commitment to your health goals. Please review the following plan we discussed and let me know if I can assist you in the future.   Screening recommendations/referrals: Colonoscopy: referral sent Mammogram: ordered 08/16/21 Bone Density: 10/21/17 Recommended yearly ophthalmology/optometry visit for glaucoma screening and checkup Recommended yearly dental visit for hygiene and checkup  Vaccinations: Influenza vaccine: n/d Pneumococcal vaccine: 01/19/20 Tdap vaccine: 09/25/17 Shingles vaccine: n/d   Covid-19:n/d  Advanced directives: no  Conditions/risks identified: none  Next appointment: Follow up in one year for your annual wellness visit 01/27/23 @ 1 PM IN PERSON   Preventive Care 39 Years and Older, Female Preventive care refers to lifestyle choices and visits with your health care provider that can promote health and wellness. What does preventive care include? A yearly physical exam. This is also called an annual well check. Dental exams once or twice a year. Routine eye exams. Ask your health care provider how often you should have your eyes checked. Personal lifestyle choices, including: Daily care of your teeth and gums. Regular physical activity. Eating a healthy diet. Avoiding tobacco and drug use. Limiting alcohol use. Practicing safe sex. Taking low-dose aspirin every day. Taking vitamin and mineral supplements as recommended by your health care provider. What happens during an annual well check? The services and screenings done by your health care provider during your annual well check will depend on your age, overall health, lifestyle risk factors, and family history of disease. Counseling  Your health care provider may ask you questions about your: Alcohol use. Tobacco use. Drug use. Emotional well-being. Home and relationship well-being. Sexual  activity. Eating habits. History of falls. Memory and ability to understand (cognition). Work and work Statistician. Reproductive health. Screening  You may have the following tests or measurements: Height, weight, and BMI. Blood pressure. Lipid and cholesterol levels. These may be checked every 5 years, or more frequently if you are over 62 years old. Skin check. Lung cancer screening. You may have this screening every year starting at age 63 if you have a 30-pack-year history of smoking and currently smoke or have quit within the past 15 years. Fecal occult blood test (FOBT) of the stool. You may have this test every year starting at age 72. Flexible sigmoidoscopy or colonoscopy. You may have a sigmoidoscopy every 5 years or a colonoscopy every 10 years starting at age 24. Hepatitis C blood test. Hepatitis B blood test. Sexually transmitted disease (STD) testing. Diabetes screening. This is done by checking your blood sugar (glucose) after you have not eaten for a while (fasting). You may have this done every 1-3 years. Bone density scan. This is done to screen for osteoporosis. You may have this done starting at age 65. Mammogram. This may be done every 1-2 years. Talk to your health care provider about how often you should have regular mammograms. Talk with your health care provider about your test results, treatment options, and if necessary, the need for more tests. Vaccines  Your health care provider may recommend certain vaccines, such as: Influenza vaccine. This is recommended every year. Tetanus, diphtheria, and acellular pertussis (Tdap, Td) vaccine. You may need a Td booster every 10 years. Zoster vaccine. You may need this after age 44. Pneumococcal 13-valent conjugate (PCV13) vaccine. One dose is recommended after age 15. Pneumococcal polysaccharide (PPSV23) vaccine. One dose is recommended after age 80. Talk to your health care  provider about which screenings and vaccines  you need and how often you need them. This information is not intended to replace advice given to you by your health care provider. Make sure you discuss any questions you have with your health care provider. Document Released: 05/19/2015 Document Revised: 01/10/2016 Document Reviewed: 02/21/2015 Elsevier Interactive Patient Education  2017 [place] Prevention in the Home Falls can cause injuries. They can happen to people of all ages. There are many things you can do to make your home safe and to help prevent falls. What can I do on the outside of my home? Regularly fix the edges of walkways and driveways and fix any cracks. Remove anything that might make you trip as you walk through a door, such as a raised step or threshold. Trim any bushes or trees on the path to your home. Use bright outdoor lighting. Clear any walking paths of anything that might make someone trip, such as rocks or tools. Regularly check to see if handrails are loose or broken. Make sure that both sides of any steps have handrails. Any raised decks and porches should have guardrails on the edges. Have any leaves, snow, or ice cleared regularly. Use sand or salt on walking paths during winter. Clean up any spills in your garage right away. This includes oil or grease spills. What can I do in the bathroom? Use night lights. Install grab bars by the toilet and in the tub and shower. Do not use towel bars as grab bars. Use non-skid mats or decals in the tub or shower. If you need to sit down in the shower, use a plastic, non-slip stool. Keep the floor dry. Clean up any water that spills on the floor as soon as it happens. Remove soap buildup in the tub or shower regularly. Attach bath mats securely with double-sided non-slip rug tape. Do not have throw rugs and other things on the floor that can make you trip. What can I do in the bedroom? Use night lights. Make sure that you have a light by your bed that  is easy to reach. Do not use any sheets or blankets that are too big for your bed. They should not hang down onto the floor. Have a firm chair that has side arms. You can use this for support while you get dressed. Do not have throw rugs and other things on the floor that can make you trip. What can I do in the kitchen? Clean up any spills right away. Avoid walking on wet floors. Keep items that you use a lot in easy-to-reach places. If you need to reach something above you, use a strong step stool that has a grab bar. Keep electrical cords out of the way. Do not use floor polish or wax that makes floors slippery. If you must use wax, use non-skid floor wax. Do not have throw rugs and other things on the floor that can make you trip. What can I do with my stairs? Do not leave any items on the stairs. Make sure that there are handrails on both sides of the stairs and use them. Fix handrails that are broken or loose. Make sure that handrails are as long as the stairways. Check any carpeting to make sure that it is firmly attached to the stairs. Fix any carpet that is loose or worn. Avoid having throw rugs at the top or bottom of the stairs. If you do have throw rugs, attach them to the  floor with carpet tape. Make sure that you have a light switch at the top of the stairs and the bottom of the stairs. If you do not have them, ask someone to add them for you. What else can I do to help prevent falls? Wear shoes that: Do not have high heels. Have rubber bottoms. Are comfortable and fit you well. Are closed at the toe. Do not wear sandals. If you use a stepladder: Make sure that it is fully opened. Do not climb a closed stepladder. Make sure that both sides of the stepladder are locked into place. Ask someone to hold it for you, if possible. Clearly mark and make sure that you can see: Any grab bars or handrails. First and last steps. Where the edge of each step is. Use tools that help you  move around (mobility aids) if they are needed. These include: Canes. Walkers. Scooters. Crutches. Turn on the lights when you go into a dark area. Replace any light bulbs as soon as they burn out. Set up your furniture so you have a clear path. Avoid moving your furniture around. If any of your floors are uneven, fix them. If there are any pets around you, be aware of where they are. Review your medicines with your doctor. Some medicines can make you feel dizzy. This can increase your chance of falling. Ask your doctor what other things that you can do to help prevent falls. This information is not intended to replace advice given to you by your health care provider. Make sure you discuss any questions you have with your health care provider. Document Released: 02/16/2009 Document Revised: 09/28/2015 Document Reviewed: 05/27/2014 Elsevier Interactive Patient Education  2017 [employer].

## 2022-01-23 ENCOUNTER — Telehealth: Payer: Self-pay

## 2022-01-23 NOTE — Telephone Encounter (Signed)
Copied from [place] [phone] [id number]. Topic: General - Other >> Jan 22, 2022  1:00 PM Leitha Schuller wrote: Caller inquiring if a plan of care order faxed on 9-06 has been received  Please fu w/ caller

## 2022-02-04 ENCOUNTER — Telehealth: Payer: Self-pay | Admitting: Cardiology

## 2022-02-04 NOTE — Telephone Encounter (Signed)
See result note.  

## 2022-02-04 NOTE — Telephone Encounter (Signed)
Pt is returning call in regards to monitor results. Transferred to Otila Kluver, Therapist, sports.

## 2022-02-11 DIAGNOSIS — I743 Embolism and thrombosis of arteries of the lower extremities: Secondary | ICD-10-CM | POA: Diagnosis not present

## 2022-02-15 DIAGNOSIS — I10 Essential (primary) hypertension: Secondary | ICD-10-CM | POA: Diagnosis not present

## 2022-02-15 DIAGNOSIS — G4733 Obstructive sleep apnea (adult) (pediatric): Secondary | ICD-10-CM | POA: Diagnosis not present

## 2022-03-07 ENCOUNTER — Other Ambulatory Visit: Payer: Self-pay | Admitting: Family Medicine

## 2022-03-07 ENCOUNTER — Other Ambulatory Visit: Payer: Self-pay | Admitting: Physician Assistant

## 2022-03-07 DIAGNOSIS — I5020 Unspecified systolic (congestive) heart failure: Secondary | ICD-10-CM

## 2022-03-07 DIAGNOSIS — F411 Generalized anxiety disorder: Secondary | ICD-10-CM

## 2022-03-07 DIAGNOSIS — G629 Polyneuropathy, unspecified: Secondary | ICD-10-CM

## 2022-03-07 NOTE — Telephone Encounter (Signed)
Requested Prescriptions  Pending Prescriptions Disp Refills   spironolactone (ALDACTONE) 25 MG tablet [Pharmacy Med Name: SPIRONOLACTONE 25 MG TABLET] 30 tablet 0    Sig: TAKE 1/2 TABLET BY MOUTH EVERY DAY     Cardiovascular: Diuretics - Aldosterone Antagonist Failed - 03/07/2022  1:28 PM      Failed - Cr in normal range and within 180 days    Creatinine, Ser  Date Value Ref Range Status  11/20/2021 1.19 (H) 0.57 - 1.00 mg/dL Final         Passed - K in normal range and within 180 days    Potassium  Date Value Ref Range Status  11/20/2021 4.3 3.5 - 5.2 mmol/L Final         Passed - Na in normal range and within 180 days    Sodium  Date Value Ref Range Status  11/20/2021 139 134 - 144 mmol/L Final         Passed - eGFR is 30 or above and within 180 days    GFR calc Af Amer  Date Value Ref Range Status  07/19/2018 >60 >60 mL/min Final   GFR, Estimated  Date Value Ref Range Status  09/10/2021 60 (L) >60 mL/min Final    Comment:    (NOTE) Calculated using the CKD-EPI Creatinine Equation (2021)    eGFR  Date Value Ref Range Status  11/20/2021 49 (L) >59 mL/min/1.73 Final         Passed - Last BP in normal range    BP Readings from Last 1 Encounters:  01/22/22 122/80         Passed - Valid encounter within last 6 months    Recent Outpatient Visits           1 month ago AF (paroxysmal atrial fibrillation) [facility])   [facility] Thedore Mins, [place], PA-C   3 months ago HFrEF (heart failure with reduced ejection fraction) [facility])   [facility] [name], PA-C   4 months ago HFrEF (heart failure with reduced ejection fraction) [facility])   [facility] [name], PA-C   6 months ago Chronic diarrhea of unknown origin   [employer], Erin E, PA-C   6 months ago AF (paroxysmal atrial fibrillation) [facility])   [place] Bacigalupo, Dionne Bucy, MD       Future Appointments             In 2  weeks Fletcher Anon, Mertie Clause, MD [facility] [place]. [place]   In 1 month Drubel, Ria Comment, PA-C [facility], [facility]             gabapentin (NEURONTIN) 100 MG capsule [Pharmacy Med Name: GABAPENTIN 100 MG CAPSULE] 90 capsule 0    Sig: TAKE 1 CAPSULE (100 MG TOTAL) BY MOUTH AT BEDTIME AS NEEDED.     Neurology: Anticonvulsants - gabapentin Failed - 03/07/2022  1:28 PM      Failed - Cr in normal range and within 360 days    Creatinine, Ser  Date Value Ref Range Status  11/20/2021 1.19 (H) 0.57 - 1.00 mg/dL Final         Passed - Completed PHQ-2 or PHQ-9 in the last 360 days      Passed - Valid encounter within last 12 months    Recent Outpatient Visits           1 month ago AF (paroxysmal atrial fibrillation) ([place])  [facility] Thedore Mins, Ria Comment, PA-C   3 months ago HFrEF (heart failure with reduced ejection fraction) [facility])   [facility] [name], PA-C   4 months ago HFrEF (heart failure with reduced ejection fraction) [facility])   [facility] [name], PA-C   6 months ago Chronic diarrhea of unknown origin   [employer], Erin E, PA-C   6 months ago AF (paroxysmal atrial fibrillation) [facility])   [facility] Bacigalupo, Dionne Bucy, MD       Future Appointments             In 2 weeks Fletcher Anon, Mertie Clause, MD [place]. [place]   In 1 month Drubel, Ria Comment, [place], [facility]

## 2022-03-09 DIAGNOSIS — R69 Illness, unspecified: Secondary | ICD-10-CM | POA: Diagnosis not present

## 2022-03-09 DIAGNOSIS — D6869 Other thrombophilia: Secondary | ICD-10-CM | POA: Diagnosis not present

## 2022-03-09 DIAGNOSIS — G629 Polyneuropathy, unspecified: Secondary | ICD-10-CM | POA: Diagnosis not present

## 2022-03-09 DIAGNOSIS — F411 Generalized anxiety disorder: Secondary | ICD-10-CM | POA: Diagnosis not present

## 2022-03-09 DIAGNOSIS — Z6836 Body mass index (BMI) 36.0-36.9, adult: Secondary | ICD-10-CM | POA: Diagnosis not present

## 2022-03-09 DIAGNOSIS — M791 Myalgia, unspecified site: Secondary | ICD-10-CM | POA: Diagnosis not present

## 2022-03-09 DIAGNOSIS — K08409 Partial loss of teeth, unspecified cause, unspecified class: Secondary | ICD-10-CM | POA: Diagnosis not present

## 2022-03-09 DIAGNOSIS — I4891 Unspecified atrial fibrillation: Secondary | ICD-10-CM | POA: Diagnosis not present

## 2022-03-09 DIAGNOSIS — E785 Hyperlipidemia, unspecified: Secondary | ICD-10-CM | POA: Diagnosis not present

## 2022-03-09 DIAGNOSIS — F329 Major depressive disorder, single episode, unspecified: Secondary | ICD-10-CM | POA: Diagnosis not present

## 2022-03-09 DIAGNOSIS — R32 Unspecified urinary incontinence: Secondary | ICD-10-CM | POA: Diagnosis not present

## 2022-03-09 DIAGNOSIS — R030 Elevated blood-pressure reading, without diagnosis of hypertension: Secondary | ICD-10-CM | POA: Diagnosis not present

## 2022-03-12 ENCOUNTER — Telehealth: Payer: Self-pay | Admitting: Cardiology

## 2022-03-12 DIAGNOSIS — I491 Atrial premature depolarization: Secondary | ICD-10-CM

## 2022-03-12 DIAGNOSIS — I4819 Other persistent atrial fibrillation: Secondary | ICD-10-CM

## 2022-03-12 DIAGNOSIS — I480 Paroxysmal atrial fibrillation: Secondary | ICD-10-CM

## 2022-03-12 DIAGNOSIS — I471 Supraventricular tachycardia, unspecified: Secondary | ICD-10-CM

## 2022-03-12 NOTE — Telephone Encounter (Signed)
  Pt said, she had a physical last Saturday and was told she's in Afib and to call her doctor. She doesn't feel any symptoms

## 2022-03-12 NOTE — Telephone Encounter (Signed)
Left message for pt to call back  °

## 2022-03-12 NOTE — Telephone Encounter (Signed)
Vickie Epley, MD  Stana Bunting, RN; Darrell Jewel, RN Caller: Unspecified (Today, 11:47 AM) I'd recommend we get her a 2 week zio monitor to wear for palpitations. Hard to know exactly what the primary care doctor heard. Can you put that order in for her? Thanks, [name]

## 2022-03-12 NOTE — Telephone Encounter (Signed)
Spoke w/ pt.  She reports that she had a wellness visit w/ nurse for ins this past Sat and was advised that she was in afib. They did not perform an EKG, instead listened to her heart w/ stethoscope. She is s/p valve replacement ~5 mos ago, recently wore heart monitor.  She reports that she is not symptomatic, denies SOB, but does report little stamina. She is taking Xarelto 20 mg daily and amiodarone 200 mg daily. She has appt w/ Dr. Fletcher Anon next week on 03/21/22. Advised her that I will make Dr. Quentin Ore aware and call her back w/ his recommendation.

## 2022-03-12 NOTE — Telephone Encounter (Signed)
Left message for pt to call back. Advised her that I am working remote and will have to call her back from a blocked #.

## 2022-03-12 NOTE — Telephone Encounter (Signed)
Pt is returning call. Estill Bamberg, RN said she would call her back.

## 2022-03-13 ENCOUNTER — Other Ambulatory Visit: Payer: Self-pay | Admitting: Family Medicine

## 2022-03-13 DIAGNOSIS — I5020 Unspecified systolic (congestive) heart failure: Secondary | ICD-10-CM

## 2022-03-13 NOTE — Telephone Encounter (Signed)
Requested Prescriptions  Pending Prescriptions Disp Refills   spironolactone (ALDACTONE) 25 MG tablet [Pharmacy Med Name: SPIRONOLACTONE 25 MG TABLET] 30 tablet 0    Sig: TAKE 1/2 TABLET BY MOUTH DAILY     Cardiovascular: Diuretics - Aldosterone Antagonist Failed - 03/13/2022 10:56 AM      Failed - Cr in normal range and within 180 days    Creatinine, Ser  Date Value Ref Range Status  11/20/2021 1.19 (H) 0.57 - 1.00 mg/dL Final         Passed - K in normal range and within 180 days    Potassium  Date Value Ref Range Status  11/20/2021 4.3 3.5 - 5.2 mmol/L Final         Passed - Na in normal range and within 180 days    Sodium  Date Value Ref Range Status  11/20/2021 139 134 - 144 mmol/L Final         Passed - eGFR is 30 or above and within 180 days    GFR calc Af Amer  Date Value Ref Range Status  07/19/2018 >60 >60 mL/min Final   GFR, Estimated  Date Value Ref Range Status  09/10/2021 60 (L) >60 mL/min Final    Comment:    (NOTE) Calculated using the CKD-EPI Creatinine Equation (2021)    eGFR  Date Value Ref Range Status  11/20/2021 49 (L) >59 mL/min/1.73 Final         Passed - Last BP in normal range    BP Readings from Last 1 Encounters:  01/22/22 122/80         Passed - Valid encounter within last 6 months    Recent Outpatient Visits           2 months ago AF (paroxysmal atrial fibrillation) [facility])   [facility] Thedore Mins, [place], PA-C   3 months ago HFrEF (heart failure with reduced ejection fraction) [facility])   [facility] [name], PA-C   4 months ago HFrEF (heart failure with reduced ejection fraction) [facility])   [facility] [name], PA-C   6 months ago Chronic diarrhea of unknown origin   [employer], Erin E, PA-C   6 months ago AF (paroxysmal atrial fibrillation) [facility])   [place] Bacigalupo, Dionne Bucy, MD       Future Appointments             In 1 week  Fletcher Anon, Mertie Clause, MD [place] [place]   In 3 weeks Drubel, Ria Comment, [place], [facility]

## 2022-03-13 NOTE — Telephone Encounter (Signed)
Spoke w/ pt.   Advised her of Dr. Mardene Speak recommendation. She is agreeable to wearing monitor. Verified mailing address and placed order.

## 2022-03-18 DIAGNOSIS — G4733 Obstructive sleep apnea (adult) (pediatric): Secondary | ICD-10-CM | POA: Diagnosis not present

## 2022-03-18 DIAGNOSIS — I10 Essential (primary) hypertension: Secondary | ICD-10-CM | POA: Diagnosis not present

## 2022-03-21 ENCOUNTER — Ambulatory Visit: Payer: Medicare HMO | Attending: Cardiovascular Disease | Admitting: Cardiovascular Disease

## 2022-03-21 ENCOUNTER — Encounter: Payer: Self-pay | Admitting: Cardiovascular Disease

## 2022-03-21 ENCOUNTER — Other Ambulatory Visit
Admission: RE | Admit: 2022-03-21 | Discharge: 2022-03-21 | Disposition: A | Discharge status: Home / Self Care | Payer: Medicare HMO | Attending: Cardiovascular Disease | Admitting: Cardiovascular Disease

## 2022-03-21 VITALS — BP 140/70 | HR 111 | Ht 65.0 in | Wt 217.2 lb

## 2022-03-21 DIAGNOSIS — I480 Paroxysmal atrial fibrillation: Secondary | ICD-10-CM

## 2022-03-21 DIAGNOSIS — I482 Chronic atrial fibrillation, unspecified: Secondary | ICD-10-CM | POA: Insufficient documentation

## 2022-03-21 DIAGNOSIS — I10 Essential (primary) hypertension: Secondary | ICD-10-CM | POA: Diagnosis not present

## 2022-03-21 DIAGNOSIS — G4730 Sleep apnea, unspecified: Secondary | ICD-10-CM | POA: Diagnosis not present

## 2022-03-21 LAB — CBC
HCT: 36.9 % (ref 36.0–46.0)
Hemoglobin: 11.5 g/dL — ABNORMAL LOW (ref 12.0–15.0)
MCH: 26.8 pg (ref 26.0–34.0)
MCHC: 31.2 g/dL (ref 30.0–36.0)
MCV: 86 fL (ref 80.0–100.0)
Platelets: 254 10*3/uL (ref 150–400)
RBC: 4.29 MIL/uL (ref 3.87–5.11)
RDW: 17.2 % — ABNORMAL HIGH (ref 11.5–15.5)
WBC: 9.1 10*3/uL (ref 4.0–10.5)
nRBC: 0 % (ref 0.0–0.2)

## 2022-03-21 LAB — COMPREHENSIVE METABOLIC PANEL
ALT: 16 U/L (ref 0–44)
AST: 24 U/L (ref 15–41)
Albumin: 4.1 g/dL (ref 3.5–5.0)
Alkaline Phosphatase: 87 U/L (ref 38–126)
Anion gap: 10 (ref 5–15)
BUN: 22 mg/dL (ref 8–23)
CO2: 21 mmol/L — ABNORMAL LOW (ref 22–32)
Calcium: 8.9 mg/dL (ref 8.9–10.3)
Chloride: 107 mmol/L (ref 98–111)
Creatinine, Ser: 1.36 mg/dL — ABNORMAL HIGH (ref 0.44–1.00)
GFR, Estimated: 42 mL/min — ABNORMAL LOW (ref >60)
Glucose, Bld: 94 mg/dL (ref 70–99)
Potassium: 4.2 mmol/L (ref 3.5–5.1)
Sodium: 138 mmol/L (ref 135–145)
Total Bilirubin: 1 mg/dL (ref 0.3–1.2)
Total Protein: 7.8 g/dL (ref 6.5–8.1)

## 2022-03-21 LAB — TSH: TSH: 2.083 u[IU]/mL (ref 0.350–4.500)

## 2022-03-21 MED ORDER — AMIODARONE HCL 200 MG PO TABS: 200.0000 mg | ORAL_TABLET | Freq: Every day | ORAL | 1 refills | Status: DC

## 2022-03-21 NOTE — Patient Instructions (Signed)
Medication Instructions:  INCREASE the Amiodarone to 200 mg twice daily for one week then decrease it back to 200 mg once daily  *If you need a refill on your cardiac medications before your next appointment, please call your pharmacy*   Lab Work: Your provider would like for you to have the following labs drawn: CBC, CMET, and a TSH.   Please go to the [facility] entrance and check in at the front desk.  You do not need an appointment.  They are open from 7am-6 pm.  You will not need to be fasting.  If you have labs (blood work) drawn today and your tests are completely normal, you will receive your results only by: [place] (if you have MyChart) OR A paper copy in the mail If you have any lab test that is abnormal or we need to change your treatment, we will call you to review the results.   Testing/Procedures: None ordered   Follow-Up: At [facility], you and your health needs are our priority.  As part of our continuing mission to provide you with exceptional heart care, we have created designated Provider Care Teams.  These Care Teams include your primary Cardiologist (physician) and Advanced Practice Providers (APPs -  Physician Assistants and Nurse Practitioners) who all work together to provide you with the care you need, when you need it.  We recommend signing up for the patient portal called "MyChart".  Sign up information is provided on this After Visit Summary.  MyChart is used to connect with patients for Virtual Visits (Telemedicine).  Patients are able to view lab/test results, encounter notes, upcoming appointments, etc.  Non-urgent messages can be sent to your provider as well.   To learn more about what you can do with MyChart, go to [email or web address].    Your next appointment:   1 month(s)  The format for your next appointment:   In Person  Provider:   You may see [name], MD or one of the following Advanced Practice  Providers on your designated Care Team:   Murray Hodgkins, NP Christell Faith, PA-C [place] Kathlen Mody, PA-C Gerrie Nordmann, NP

## 2022-03-21 NOTE — Progress Notes (Unsigned)
Cardiology Office Note   Date:  03/27/2022   ID:  Tina Peters, DOB 02-Mar-1951, MRN [id number]  PCP:  Virginia Crews, MD  Cardiologist:   [name], MD   Chief Complaint  Patient presents with   Other    3 Month f/u c/o elevated BP and elevated HR, pt had wellness visit and was told she was in afib would like to discuss. Meds reviewed verbally with pt.      History of Present Illness: Tina Peters is a 71 y.o. female who presents for a follow-up visit regarding persistent atrial fibrillation and mitral valve replacement..  She has known history of hypertension, hyperlipidemia sleep apnea on CPAP and tobacco use. She had an episode of supraventricular tachycardia in 2016 in the setting of hypokalemia thought to be due to treatment with hydrochlorothiazide. She was placed on small dose extended release diltiazem with improvement in symptoms . It was switched to metoprolol  due to leg edema.  She had atrial fibrillation with rapid ventricular response in 05/2016.  She converted to sinus rhythm with diltiazem. The patient was started on Xarelto for anticoagulation. The dose of Toprol was increased.  She was hospitalized at [facility] in June with Streptococcus sanguinous bacteremia from an infected tooth and was found to have mitral valve vegetation.  Cardiac catheterization before surgery showed mild nonobstructive coronary artery disease.  She had a prolonged hospitalization and underwent mitral valve replacement with 27 mm Mitris bioprosthetic valve, maze procedure and left atrial appendage clip.  Postoperatively, she had acute left lower extremity ischemia which required left common femoral artery exposure, thrombectomy of the left iliac, common femoral, superficial femoral and profunda arteries as well as 4 compartment fasciotomy.  Postoperative echo showed an EF of 40 to 45%.  She also had postoperative atrial fibrillation with subsequent cardioversion.  She was discharged to a  skilled nursing facility.  She has been doing reasonably well with no chest pain or worsening dyspnea.  She has noted to be tachycardic today but denies palpitations.  She is on anticoagulation with Xarelto.   Past Medical History:  Diagnosis Date   Allergy    Anxiety    Arthritis    Asthma    Chronic back pain    Depression    Diastolic dysfunction    a. 02/2020 Echo: EF 60-65%, no rwma. Gr2 DD. Nl RV size/fxn. Mild LAE. Mild MR.   Diverticulitis    Hyperlipidemia    Hypertension    Lipoma    Mild mitral regurgitation    a. 08/2016 Echo: mild MR; b. 02/2020 Echo: Mild MR.   Neuromuscular disorder (HCC)    Obesity    Obstructive sleep apnea    PAF (paroxysmal atrial fibrillation) ([place])    a. Dx 05/2016--> Xarelto (CHA2DS2VASc = 3); b. 08/2016 Echo: EF 55-60%, no rwma, mild MR, mod dil LA; c. 03/2020 Zio: Avg HR 61 w/ Afib burden of 24% - max rate 189 (avg 93). 2 pauses - longest 4.7 secs post-conversion pauses (occurred @ 5:42 am).   Paroxysmal SVT (supraventricular tachycardia)    Sleep apnea    Stroke [facility])    a. 02/2020 MRI/A: multifocal acute ischemia in R MCA territory predominantly involving post insula and R middle frontal gyrus. Nl MRA.   Tachy-brady syndrome ([place])    Tobacco abuse     Past Surgical History:  Procedure Laterality Date   ACHILLES TENDON SURGERY Left    BACK SURGERY     2  ruptured discs in L spine, no hardware   KNEE ARTHROSCOPY W/ MENISCAL REPAIR Right    in the 1990s   SALPINGOOPHORECTOMY Bilateral    TUMOR REMOVAL     neck, ?lipoma     Current Outpatient Medications  Medication Sig Dispense Refill   acetaminophen (TYLENOL) 325 MG tablet Take by mouth. 4 tablets as needed     albuterol (VENTOLIN HFA) 108 (90 Base) MCG/ACT inhaler Inhale 2 puffs into the lungs every 6 (six) hours as needed for wheezing or shortness of breath. 18 g 3   aspirin 81 MG chewable tablet Chew by mouth.     atorvastatin (LIPITOR) 40 MG tablet Take 1 tablet (40 mg  total) by mouth daily. 90 tablet 4   gabapentin (NEURONTIN) 100 MG capsule TAKE 1 CAPSULE (100 MG TOTAL) BY MOUTH AT BEDTIME AS NEEDED. 90 capsule 0   hydrocortisone 2.5 % cream Apply 1 application topically as needed.     methocarbamol (ROBAXIN) 500 MG tablet Take 1 tablet (500 mg total) by mouth every 8 (eight) hours as needed. 90 tablet 4   metoprolol succinate (TOPROL-XL) 50 MG 24 hr tablet Take 1 tablet (50 mg total) by mouth daily. 30 tablet 11   rivaroxaban (XARELTO) 20 MG TABS tablet Take 1 tablet (20 mg total) by mouth daily with supper. Take with dinner or largest meal of the day 90 tablet 1   sertraline (ZOLOFT) 50 MG tablet TAKE 1 TABLET BY MOUTH EVERY DAY 90 tablet 1   amiodarone (PACERONE) 200 MG tablet Take 1 tablet (200 mg total) by mouth daily. 37 tablet 1   spironolactone (ALDACTONE) 25 MG tablet TAKE 1/2 TABLET BY MOUTH EVERY DAY (Patient not taking: Reported on 03/21/2022) 30 tablet 0   No current facility-administered medications for this visit.    Allergies:   Patient has no known allergies.    Social History:  The patient  reports that she quit smoking about 8 years ago. Her smoking use included cigarettes. She has a 10.00 pack-year smoking history. She has never used smokeless tobacco. She reports current alcohol use. She reports that she does not use drugs.   Family History:  The patient's family history includes Alcoholism in her father; CAD in her father; Diabetes in her mother; Diabetes Mellitus I in her grandson; Fibrocystic breast disease in her mother and sister; Stroke in her mother.    ROS:  Please see the history of present illness.   Otherwise, review of systems are positive for none.   All other systems are reviewed and negative.    PHYSICAL EXAM: VS:  BP (!) 140/70 (BP Location: Left Arm, Patient Position: Sitting, Cuff Size: Large)   Pulse (!) 111   Ht '5\' 5"'$  (1.651 m)   Wt 217 lb 4 oz (98.5 kg)   SpO2 98%   BMI 36.15 kg/m  , BMI Body mass index is  36.15 kg/m. GEN: Well nourished, well developed, in no acute distress  HEENT: normal  Neck: no JVD, carotid bruits, or masses Cardiac: RRR; no murmurs, rubs, or gallops,no edema  Respiratory:  clear to auscultation bilaterally, normal work of breathing GI: soft, nontender, nondistended, + BS MS: no deformity or atrophy  Skin: warm and dry, no rash Neuro:  Strength and sensation are intact Psych: euthymic mood, full affect   EKG:  EKG is ordered today. The ekg ordered today demonstrates atrial tachycardia versus atypical atrial flutter with a heart rate of 111 bpm, low voltage and left axis deviation.  Recent Labs: 03/21/2022: ALT 16; BUN 22; Creatinine, Ser 1.36; Hemoglobin 11.5; Platelets 254; Potassium 4.2; Sodium 138; TSH 2.083    Lipid Panel    Component Value Date/Time   CHOL 116 08/16/2021 1103   TRIG 82 08/16/2021 1103   HDL 39 (L) 08/16/2021 1103   CHOLHDL 3.0 08/16/2021 1103   CHOLHDL 5.1 03/02/2020 0445   VLDL 18 03/02/2020 0445   LDLCALC 61 08/16/2021 1103      Wt Readings from Last 3 Encounters:  03/21/22 217 lb 4 oz (98.5 kg)  01/22/22 213 lb 8 oz (96.8 kg)  01/08/22 210 lb (95.3 kg)           No data to display            ASSESSMENT AND PLAN:  1.persistent atrial fibrillation: She is noted to be tachycardic today and her EKG is consistent with atrial tachycardia versus atypical atrial flutter with a heart rate 111 bpm.   I requested routine labs today.  I also elected to increase amiodarone to 200 mg twice daily for 1 week.  Continue Toprol.  Will have her follow-up in 1 week.  If she remains in atrial flutter, recommend proceeding with cardioversion.  2.  Status post bioprosthetic mitral valve replacement for endocarditis: Will consider an echocardiogram once she is in sinus rhythm.  In addition, her EF was reported to be mildly decreased and that will have to be followed.  3.  Essential hypertension: Blood pressure is reasonably  controlled  4.  Sleep apnea: Currently using CPAP.   Disposition:   FU in 1 month  Signed,  [name], MD  03/27/2022 1:12 PM    [facility]

## 2022-04-08 NOTE — Progress Notes (Unsigned)
Established patient visit   Patient: Tina Peters   DOB: 1950/10/15   71 y.o. Female  MRN: [id number] Visit Date: 04/09/2022  Today's healthcare provider: [name], PA-C   No chief complaint on file.  Subjective    HPI  Hypertension, follow-up  BP Readings from Last 3 Encounters:  03/21/22 (!) 140/70  01/22/22 122/80  01/09/22 (!) 145/61   Wt Readings from Last 3 Encounters:  03/21/22 217 lb 4 oz (98.5 kg)  01/22/22 213 lb 8 oz (96.8 kg)  01/08/22 210 lb (95.3 kg)     She was last seen for hypertension {NUMBERS 1-12:18279} {days/wks/mos/yrs:310907} ago.  BP at that visit was ***. Management since that visit includes ***.  She reports {excellent/good/fair/poor:19665} compliance with treatment. She {is/is not:9024} having side effects. {document side effects if present:1} She is following a {diet:21022986} diet. She {is/is not:9024} exercising. She {does/does not:200015} smoke.  Use of agents associated with hypertension: {bp agents assoc with hypertension:511::"none"}.   Outside blood pressures are {***enter patient reported home BP readings, or 'not being checked':1}. Symptoms: {Yes/No:20286} chest pain {Yes/No:20286} chest pressure  {Yes/No:20286} palpitations {Yes/No:20286} syncope  {Yes/No:20286} dyspnea {Yes/No:20286} orthopnea  {Yes/No:20286} paroxysmal nocturnal dyspnea {Yes/No:20286} lower extremity edema   Pertinent labs Lab Results  Component Value Date   CHOL 116 08/16/2021   HDL 39 (L) 08/16/2021   LDLCALC 61 08/16/2021   TRIG 82 08/16/2021   CHOLHDL 3.0 08/16/2021   Lab Results  Component Value Date   NA 138 03/21/2022   K 4.2 03/21/2022   CREATININE 1.36 (H) 03/21/2022   GFRNONAA 42 (L) 03/21/2022   GLUCOSE 94 03/21/2022   TSH 2.083 03/21/2022     The ASCVD Risk score (Arnett DK, et al., 2019) failed to calculate for the following reasons:   The patient has a prior MI or stroke  diagnosis  ---------------------------------------------------------------------------------------------------   Medications: Outpatient Medications Prior to Visit  Medication Sig   acetaminophen (TYLENOL) 325 MG tablet Take by mouth. 4 tablets as needed   albuterol (VENTOLIN HFA) 108 (90 Base) MCG/ACT inhaler Inhale 2 puffs into the lungs every 6 (six) hours as needed for wheezing or shortness of breath.   amiodarone (PACERONE) 200 MG tablet Take 1 tablet (200 mg total) by mouth daily.   aspirin 81 MG chewable tablet Chew by mouth.   atorvastatin (LIPITOR) 40 MG tablet Take 1 tablet (40 mg total) by mouth daily.   gabapentin (NEURONTIN) 100 MG capsule TAKE 1 CAPSULE (100 MG TOTAL) BY MOUTH AT BEDTIME AS NEEDED.   hydrocortisone 2.5 % cream Apply 1 application topically as needed.   methocarbamol (ROBAXIN) 500 MG tablet Take 1 tablet (500 mg total) by mouth every 8 (eight) hours as needed.   metoprolol succinate (TOPROL-XL) 50 MG 24 hr tablet Take 1 tablet (50 mg total) by mouth daily.   rivaroxaban (XARELTO) 20 MG TABS tablet Take 1 tablet (20 mg total) by mouth daily with supper. Take with dinner or largest meal of the day   sertraline (ZOLOFT) 50 MG tablet TAKE 1 TABLET BY MOUTH EVERY DAY   spironolactone (ALDACTONE) 25 MG tablet TAKE 1/2 TABLET BY MOUTH EVERY DAY (Patient not taking: Reported on 03/21/2022)   No facility-administered medications prior to visit.    Review of Systems  {Labs  Heme  Chem  Endocrine  Serology  Results Review (optional):23779}   Objective    There were no vitals taken for this visit. {Show previous vital signs (optional):23777}  Physical Exam  ***  No results found for any visits on 04/09/22.  Assessment & Plan     ***  No follow-ups on file.      {provider attestation***:1}   [name], PA-C  [facility] [phone] (phone) [phone] (fax)  [place]

## 2022-04-09 ENCOUNTER — Encounter: Payer: Self-pay | Admitting: Physician Assistant

## 2022-04-09 ENCOUNTER — Ambulatory Visit (INDEPENDENT_AMBULATORY_CARE_PROVIDER_SITE_OTHER): Payer: Medicare HMO | Admitting: Physician Assistant

## 2022-04-09 VITALS — BP 126/81 | HR 115 | Temp 98.3°F | Wt 215.6 lb

## 2022-04-09 DIAGNOSIS — I4719 Other supraventricular tachycardia: Secondary | ICD-10-CM | POA: Diagnosis not present

## 2022-04-09 DIAGNOSIS — Z23 Encounter for immunization: Secondary | ICD-10-CM | POA: Diagnosis not present

## 2022-04-09 DIAGNOSIS — I10 Essential (primary) hypertension: Secondary | ICD-10-CM | POA: Diagnosis not present

## 2022-04-09 NOTE — Assessment & Plan Note (Signed)
Tachycardic in office, asymptomatic. Advised I would reach out to cardiology for guidance , she has a follow up with them next week.

## 2022-04-09 NOTE — Assessment & Plan Note (Signed)
In appropriate range, continue medications

## 2022-04-17 DIAGNOSIS — I10 Essential (primary) hypertension: Secondary | ICD-10-CM | POA: Diagnosis not present

## 2022-04-17 DIAGNOSIS — G4733 Obstructive sleep apnea (adult) (pediatric): Secondary | ICD-10-CM | POA: Diagnosis not present

## 2022-04-18 ENCOUNTER — Ambulatory Visit: Payer: Medicare HMO | Attending: Cardiovascular Disease | Admitting: Cardiovascular Disease

## 2022-04-18 ENCOUNTER — Other Ambulatory Visit
Admission: RE | Admit: 2022-04-18 | Discharge: 2022-04-18 | Disposition: A | Discharge status: Home / Self Care | Payer: Medicare HMO | Attending: Cardiovascular Disease | Admitting: Cardiovascular Disease

## 2022-04-18 ENCOUNTER — Encounter: Payer: Self-pay | Admitting: Cardiovascular Disease

## 2022-04-18 VITALS — BP 130/80 | HR 109 | Ht 65.0 in | Wt 217.2 lb

## 2022-04-18 DIAGNOSIS — I480 Paroxysmal atrial fibrillation: Secondary | ICD-10-CM | POA: Diagnosis not present

## 2022-04-18 DIAGNOSIS — G4730 Sleep apnea, unspecified: Secondary | ICD-10-CM | POA: Diagnosis not present

## 2022-04-18 DIAGNOSIS — I10 Essential (primary) hypertension: Secondary | ICD-10-CM

## 2022-04-18 DIAGNOSIS — I4819 Other persistent atrial fibrillation: Secondary | ICD-10-CM

## 2022-04-18 LAB — CBC
HCT: 41 % (ref 36.0–46.0)
Hemoglobin: 12.4 g/dL (ref 12.0–15.0)
MCH: 27.6 pg (ref 26.0–34.0)
MCHC: 30.2 g/dL (ref 30.0–36.0)
MCV: 91.1 fL (ref 80.0–100.0)
Platelets: 261 10*3/uL (ref 150–400)
RBC: 4.5 MIL/uL (ref 3.87–5.11)
RDW: 16 % — ABNORMAL HIGH (ref 11.5–15.5)
WBC: 8.9 10*3/uL (ref 4.0–10.5)
nRBC: 0 % (ref 0.0–0.2)

## 2022-04-18 LAB — BASIC METABOLIC PANEL
Anion gap: 2 — ABNORMAL LOW (ref 5–15)
BUN: 19 mg/dL (ref 8–23)
CO2: 24 mmol/L (ref 22–32)
Calcium: 8.8 mg/dL — ABNORMAL LOW (ref 8.9–10.3)
Chloride: 114 mmol/L — ABNORMAL HIGH (ref 98–111)
Creatinine, Ser: 1.39 mg/dL — ABNORMAL HIGH (ref 0.44–1.00)
GFR, Estimated: 41 mL/min — ABNORMAL LOW (ref >60)
Glucose, Bld: 102 mg/dL — ABNORMAL HIGH (ref 70–99)
Potassium: 4.7 mmol/L (ref 3.5–5.1)
Sodium: 140 mmol/L (ref 135–145)

## 2022-04-18 NOTE — Patient Instructions (Signed)
Medication Instructions:  No changes *If you need a refill on your cardiac medications before your next appointment, please call your pharmacy*   Lab Work: Your provider would like for you to have following labs drawn: CBC and BMET.   Please go to the [facility] entrance and check in at the front desk.  You do not need an appointment.  They are open from 7am-6 pm.   If you have labs (blood work) drawn today and your tests are completely normal, you will receive your results only by: [place] (if you have MyChart) OR A paper copy in the mail If you have any lab test that is abnormal or we need to change your treatment, we will call you to review the results.  Follow-Up: At [facility], you and your health needs are our priority.  As part of our continuing mission to provide you with exceptional heart care, we have created designated Provider Care Teams.  These Care Teams include your primary Cardiologist (physician) and Advanced Practice Providers (APPs -  Physician Assistants and Nurse Practitioners) who all work together to provide you with the care you need, when you need it.  We recommend signing up for the patient portal called "MyChart".  Sign up information is provided on this After Visit Summary.  MyChart is used to connect with patients for Virtual Visits (Telemedicine).  Patients are able to view lab/test results, encounter notes, upcoming appointments, etc.  Non-urgent messages can be sent to your provider as well.   To learn more about what you can do with MyChart, go to [email or web address].    Your next appointment:   1 month(s)  The format for your next appointment:   In Person  Provider:   You may see [name], MD or one of the following Advanced Practice Providers on your designated Care Team:   Murray Hodgkins, NP Christell Faith, PA-C Cadence Kathlen Mody, [place] Gerrie Nordmann, NP    Other Instructions    You are scheduled for a  Cardioversion on 12/18 with Dr. Fletcher Anon.  Please arrive at the [facility], [place], [place] at 6:30 am.  After entering the [place] please check-in at the registration desk (1st desk on your right) to receive your armband. (1 hour prior to procedure unless lab work is needed; if lab work is needed arrive 1.5 hours ahead)  DIET: Nothing to eat or drink after midnight except a sip of water with medications (see medication instructions below)  Medication Instructions: Hold spironolactone the morning of the procedure Continue your anticoagulant: Xarelto If you miss a dose, please call us at [phone] You will need to continue your anticoagulant after your procedure until you are told by your provider that it is safe to stop.   Labs: 12//14/23  FYI: For your safety, and to allow [place] to monitor your vital signs accurately during the surgery/procedure we request that if you have artificial nails, gel coating, SNS etc. Please have those removed prior to your surgery/procedure. Not having the nail coverings /polish removed may result in cancellation or delay of your surgery/procedure.  You must have a responsible person to drive you home and stay in the waiting area during your procedure. Failure to do so could result in cancellation.  Bring your insurance cards.  If you have any questions after you get home, please call the office at [phone]  *Special Note: Every effort is made to have your procedure done on  time. Occasionally there are emergencies that occur at the hospital that may cause delays. Please be patient if a delay does occur.

## 2022-04-18 NOTE — H&P (View-Only) (Signed)
Cardiology Office Note   Date:  04/18/2022   ID:  Tina Peters, DOB October 25, 1950, MRN [id number]  PCP:  Tina Crews, MD  Cardiologist:   Tina Sacramento, MD   Chief Complaint  Patient presents with   Other    1 month f/u c/o rapid heart beat. Meds reviewed verbally with pt.      History of Present Illness: Tina Peters is a 71 y.o. female who presents for a follow-up visit regarding persistent atrial fibrillation and mitral valve replacement..  She has known history of hypertension, hyperlipidemia sleep apnea on CPAP and tobacco use. She had an episode of supraventricular tachycardia in 2016 in the setting of hypokalemia thought to be due to treatment with hydrochlorothiazide. She was placed on small dose extended release diltiazem with improvement in symptoms . It was switched to metoprolol  due to leg edema.  She had atrial fibrillation with rapid ventricular response in 05/2016.  She converted to sinus rhythm with diltiazem. The patient was started on Xarelto for anticoagulation. The dose of Toprol was increased.  She was hospitalized at [facility] in June with Streptococcus sanguinous bacteremia from an infected tooth and was found to have mitral valve vegetation.  Cardiac catheterization before surgery showed mild nonobstructive coronary artery disease.  She had a prolonged hospitalization and underwent mitral valve replacement with 27 mm Mitris bioprosthetic valve, maze procedure and left atrial appendage clip.  Postoperatively, she had acute left lower extremity ischemia which required left common femoral artery exposure, thrombectomy of the left iliac, common femoral, superficial femoral and profunda arteries as well as 4 compartment fasciotomy.  Postoperative echo showed an EF of 40 to 45%.  She also had postoperative atrial fibrillation with subsequent cardioversion.  She was discharged to a skilled nursing facility.  She was noted to be tachycardic last visit likely due to  atypical atrial flutter.  She was asymptomatic.  I increased amiodarone to twice daily for 1 week but she remains in the same rhythm.  No chest pain or shortness of breath.   Past Medical History:  Diagnosis Date   Allergy    Anxiety    Arthritis    Asthma    Chronic back pain    Depression    Diastolic dysfunction    a. 02/2020 Echo: EF 60-65%, no rwma. Gr2 DD. Nl RV size/fxn. Mild LAE. Mild MR.   Diverticulitis    Hyperlipidemia    Hypertension    Lipoma    Mild mitral regurgitation    a. 08/2016 Echo: mild MR; b. 02/2020 Echo: Mild MR.   Neuromuscular disorder (HCC)    Obesity    Obstructive sleep apnea    PAF (paroxysmal atrial fibrillation) ([place])    a. Dx 05/2016--> Xarelto (CHA2DS2VASc = 3); b. 08/2016 Echo: EF 55-60%, no rwma, mild MR, mod dil LA; c. 03/2020 Zio: Avg HR 61 w/ Afib burden of 24% - max rate 189 (avg 93). 2 pauses - longest 4.7 secs post-conversion pauses (occurred @ 5:42 am).   Paroxysmal SVT (supraventricular tachycardia)    Sleep apnea    Stroke [facility])    a. 02/2020 MRI/A: multifocal acute ischemia in R MCA territory predominantly involving post insula and R middle frontal gyrus. Nl MRA.   Tachy-brady syndrome ([place])    Tobacco abuse     Past Surgical History:  Procedure Laterality Date   ACHILLES TENDON SURGERY Left    BACK SURGERY     2 ruptured discs in L spine,  no hardware   KNEE ARTHROSCOPY W/ MENISCAL REPAIR Right    in the 1990s   SALPINGOOPHORECTOMY Bilateral    TUMOR REMOVAL     neck, ?lipoma     Current Outpatient Medications  Medication Sig Dispense Refill   acetaminophen (TYLENOL) 325 MG tablet Take by mouth. 4 tablets as needed     albuterol (VENTOLIN HFA) 108 (90 Base) MCG/ACT inhaler Inhale 2 puffs into the lungs every 6 (six) hours as needed for wheezing or shortness of breath. 18 g 3   amiodarone (PACERONE) 200 MG tablet Take 1 tablet (200 mg total) by mouth daily. 37 tablet 1   aspirin 81 MG chewable tablet Chew by mouth.      atorvastatin (LIPITOR) 40 MG tablet Take 1 tablet (40 mg total) by mouth daily. 90 tablet 4   gabapentin (NEURONTIN) 100 MG capsule TAKE 1 CAPSULE (100 MG TOTAL) BY MOUTH AT BEDTIME AS NEEDED. 90 capsule 0   hydrocortisone 2.5 % cream Apply 1 application topically as needed.     methocarbamol (ROBAXIN) 500 MG tablet Take 1 tablet (500 mg total) by mouth every 8 (eight) hours as needed. 90 tablet 4   metoprolol succinate (TOPROL-XL) 50 MG 24 hr tablet Take 1 tablet (50 mg total) by mouth daily. 30 tablet 11   rivaroxaban (XARELTO) 20 MG TABS tablet Take 1 tablet (20 mg total) by mouth daily with supper. Take with dinner or largest meal of the day 90 tablet 1   sertraline (ZOLOFT) 50 MG tablet TAKE 1 TABLET BY MOUTH EVERY DAY 90 tablet 1   spironolactone (ALDACTONE) 25 MG tablet TAKE 1/2 TABLET BY MOUTH EVERY DAY (Patient not taking: Reported on 04/18/2022) 30 tablet 0   No current facility-administered medications for this visit.    Allergies:   Patient has no known allergies.    Social History:  The patient  reports that she quit smoking about 8 years ago. Her smoking use included cigarettes. She has a 10.00 pack-year smoking history. She has never used smokeless tobacco. She reports current alcohol use. She reports that she does not use drugs.   Family History:  The patient's family history includes Alcoholism in her father; CAD in her father; Diabetes in her mother; Diabetes Mellitus I in her grandson; Fibrocystic breast disease in her mother and sister; Stroke in her mother.    ROS:  Please see the history of present illness.   Otherwise, review of systems are positive for none.   All other systems are reviewed and negative.    PHYSICAL EXAM: VS:  BP 130/80 (BP Location: Left Arm, Patient Position: Sitting, Cuff Size: Normal)   Pulse (!) 109   Ht '5\' 5"'$  (1.651 m)   Wt 217 lb 4 oz (98.5 kg)   SpO2 98%   BMI 36.15 kg/m  , BMI Body mass index is 36.15 kg/m. GEN: Well nourished, well  developed, in no acute distress  HEENT: normal  Neck: no JVD, carotid bruits, or masses Cardiac: RRR and tachycardic; no murmurs, rubs, or gallops,no edema  Respiratory:  clear to auscultation bilaterally, normal work of breathing GI: soft, nontender, nondistended, + BS MS: no deformity or atrophy  Skin: warm and dry, no rash Neuro:  Strength and sensation are intact Psych: euthymic mood, full affect   EKG:  EKG is ordered today. The ekg ordered today demonstrates atrial tachycardia versus atypical atrial flutter with a heart rate of 109 bpm, low voltage and left axis deviation.   Recent Labs:  03/21/2022: ALT 16; TSH 2.083 04/18/2022: BUN 19; Creatinine, Ser 1.39; Hemoglobin 12.4; Platelets 261; Potassium 4.7; Sodium 140    Lipid Panel    Component Value Date/Time   CHOL 116 08/16/2021 1103   TRIG 82 08/16/2021 1103   HDL 39 (L) 08/16/2021 1103   CHOLHDL 3.0 08/16/2021 1103   CHOLHDL 5.1 03/02/2020 0445   VLDL 18 03/02/2020 0445   LDLCALC 61 08/16/2021 1103      Wt Readings from Last 3 Encounters:  04/18/22 217 lb 4 oz (98.5 kg)  04/09/22 215 lb 9.6 oz (97.8 kg)  03/21/22 217 lb 4 oz (98.5 kg)           No data to display            ASSESSMENT AND PLAN:  1. Persistent atrial fibrillation: She continues to be in atypical atrial flutter versus atrial tachycardia.  She has not missed any doses of Xarelto.  I recommend proceeding with cardioversion next week.  Continue amiodarone and metoprolol.  I discussed the procedure in details as well as risks and benefits.    2.  Status post bioprosthetic mitral valve replacement for endocarditis: Will consider an echocardiogram once she is in sinus rhythm.  In addition, her EF was reported to be mildly decreased and that will have to be followed.  3.  Essential hypertension: Blood pressure is reasonably controlled  4.  Sleep apnea: Currently using CPAP.   Disposition:   FU in 1 month  Signed,  Tina Sacramento, MD   04/18/2022 5:38 PM    [place]

## 2022-04-18 NOTE — Progress Notes (Signed)
Cardiology Office Note   Date:  04/18/2022   ID:  Tina Peters, DOB 1950/09/27, MRN [id number]  PCP:  Virginia Crews, MD  Cardiologist:   [name], MD   Chief Complaint  Patient presents with   Other    1 month f/u c/o rapid heart beat. Meds reviewed verbally with pt.      History of Present Illness: Tina Peters is a 71 y.o. female who presents for a follow-up visit regarding persistent atrial fibrillation and mitral valve replacement..  She has known history of hypertension, hyperlipidemia sleep apnea on CPAP and tobacco use. She had an episode of supraventricular tachycardia in 2016 in the setting of hypokalemia thought to be due to treatment with hydrochlorothiazide. She was placed on small dose extended release diltiazem with improvement in symptoms . It was switched to metoprolol  due to leg edema.  She had atrial fibrillation with rapid ventricular response in 05/2016.  She converted to sinus rhythm with diltiazem. The patient was started on Xarelto for anticoagulation. The dose of Toprol was increased.  She was hospitalized at [facility] in June with Streptococcus sanguinous bacteremia from an infected tooth and was found to have mitral valve vegetation.  Cardiac catheterization before surgery showed mild nonobstructive coronary artery disease.  She had a prolonged hospitalization and underwent mitral valve replacement with 27 mm Mitris bioprosthetic valve, maze procedure and left atrial appendage clip.  Postoperatively, she had acute left lower extremity ischemia which required left common femoral artery exposure, thrombectomy of the left iliac, common femoral, superficial femoral and profunda arteries as well as 4 compartment fasciotomy.  Postoperative echo showed an EF of 40 to 45%.  She also had postoperative atrial fibrillation with subsequent cardioversion.  She was discharged to a skilled nursing facility.  She was noted to be tachycardic last visit likely due to  atypical atrial flutter.  She was asymptomatic.  I increased amiodarone to twice daily for 1 week but she remains in the same rhythm.  No chest pain or shortness of breath.   Past Medical History:  Diagnosis Date   Allergy    Anxiety    Arthritis    Asthma    Chronic back pain    Depression    Diastolic dysfunction    a. 02/2020 Echo: EF 60-65%, no rwma. Gr2 DD. Nl RV size/fxn. Mild LAE. Mild MR.   Diverticulitis    Hyperlipidemia    Hypertension    Lipoma    Mild mitral regurgitation    a. 08/2016 Echo: mild MR; b. 02/2020 Echo: Mild MR.   Neuromuscular disorder (HCC)    Obesity    Obstructive sleep apnea    PAF (paroxysmal atrial fibrillation) ([place])    a. Dx 05/2016--> Xarelto (CHA2DS2VASc = 3); b. 08/2016 Echo: EF 55-60%, no rwma, mild MR, mod dil LA; c. 03/2020 Zio: Avg HR 61 w/ Afib burden of 24% - max rate 189 (avg 93). 2 pauses - longest 4.7 secs post-conversion pauses (occurred @ 5:42 am).   Paroxysmal SVT (supraventricular tachycardia)    Sleep apnea    Stroke [facility])    a. 02/2020 MRI/A: multifocal acute ischemia in R MCA territory predominantly involving post insula and R middle frontal gyrus. Nl MRA.   Tachy-brady syndrome ([place])    Tobacco abuse     Past Surgical History:  Procedure Laterality Date   ACHILLES TENDON SURGERY Left    BACK SURGERY     2 ruptured discs in L spine,  no hardware   KNEE ARTHROSCOPY W/ MENISCAL REPAIR Right    in the 1990s   SALPINGOOPHORECTOMY Bilateral    TUMOR REMOVAL     neck, ?lipoma     Current Outpatient Medications  Medication Sig Dispense Refill   acetaminophen (TYLENOL) 325 MG tablet Take by mouth. 4 tablets as needed     albuterol (VENTOLIN HFA) 108 (90 Base) MCG/ACT inhaler Inhale 2 puffs into the lungs every 6 (six) hours as needed for wheezing or shortness of breath. 18 g 3   amiodarone (PACERONE) 200 MG tablet Take 1 tablet (200 mg total) by mouth daily. 37 tablet 1   aspirin 81 MG chewable tablet Chew by mouth.      atorvastatin (LIPITOR) 40 MG tablet Take 1 tablet (40 mg total) by mouth daily. 90 tablet 4   gabapentin (NEURONTIN) 100 MG capsule TAKE 1 CAPSULE (100 MG TOTAL) BY MOUTH AT BEDTIME AS NEEDED. 90 capsule 0   hydrocortisone 2.5 % cream Apply 1 application topically as needed.     methocarbamol (ROBAXIN) 500 MG tablet Take 1 tablet (500 mg total) by mouth every 8 (eight) hours as needed. 90 tablet 4   metoprolol succinate (TOPROL-XL) 50 MG 24 hr tablet Take 1 tablet (50 mg total) by mouth daily. 30 tablet 11   rivaroxaban (XARELTO) 20 MG TABS tablet Take 1 tablet (20 mg total) by mouth daily with supper. Take with dinner or largest meal of the day 90 tablet 1   sertraline (ZOLOFT) 50 MG tablet TAKE 1 TABLET BY MOUTH EVERY DAY 90 tablet 1   spironolactone (ALDACTONE) 25 MG tablet TAKE 1/2 TABLET BY MOUTH EVERY DAY (Patient not taking: Reported on 04/18/2022) 30 tablet 0   No current facility-administered medications for this visit.    Allergies:   Patient has no known allergies.    Social History:  The patient  reports that she quit smoking about 8 years ago. Her smoking use included cigarettes. She has a 10.00 pack-year smoking history. She has never used smokeless tobacco. She reports current alcohol use. She reports that she does not use drugs.   Family History:  The patient's family history includes Alcoholism in her father; CAD in her father; Diabetes in her mother; Diabetes Mellitus I in her grandson; Fibrocystic breast disease in her mother and sister; Stroke in her mother.    ROS:  Please see the history of present illness.   Otherwise, review of systems are positive for none.   All other systems are reviewed and negative.    PHYSICAL EXAM: VS:  BP 130/80 (BP Location: Left Arm, Patient Position: Sitting, Cuff Size: Normal)   Pulse (!) 109   Ht '5\' 5"'$  (1.651 m)   Wt 217 lb 4 oz (98.5 kg)   SpO2 98%   BMI 36.15 kg/m  , BMI Body mass index is 36.15 kg/m. GEN: Well nourished, well  developed, in no acute distress  HEENT: normal  Neck: no JVD, carotid bruits, or masses Cardiac: RRR and tachycardic; no murmurs, rubs, or gallops,no edema  Respiratory:  clear to auscultation bilaterally, normal work of breathing GI: soft, nontender, nondistended, + BS MS: no deformity or atrophy  Skin: warm and dry, no rash Neuro:  Strength and sensation are intact Psych: euthymic mood, full affect   EKG:  EKG is ordered today. The ekg ordered today demonstrates atrial tachycardia versus atypical atrial flutter with a heart rate of 109 bpm, low voltage and left axis deviation.   Recent Labs:  03/21/2022: ALT 16; TSH 2.083 04/18/2022: BUN 19; Creatinine, Ser 1.39; Hemoglobin 12.4; Platelets 261; Potassium 4.7; Sodium 140    Lipid Panel    Component Value Date/Time   CHOL 116 08/16/2021 1103   TRIG 82 08/16/2021 1103   HDL 39 (L) 08/16/2021 1103   CHOLHDL 3.0 08/16/2021 1103   CHOLHDL 5.1 03/02/2020 0445   VLDL 18 03/02/2020 0445   LDLCALC 61 08/16/2021 1103      Wt Readings from Last 3 Encounters:  04/18/22 217 lb 4 oz (98.5 kg)  04/09/22 215 lb 9.6 oz (97.8 kg)  03/21/22 217 lb 4 oz (98.5 kg)           No data to display            ASSESSMENT AND PLAN:  1. Persistent atrial fibrillation: She continues to be in atypical atrial flutter versus atrial tachycardia.  She has not missed any doses of Xarelto.  I recommend proceeding with cardioversion next week.  Continue amiodarone and metoprolol.  I discussed the procedure in details as well as risks and benefits.    2.  Status post bioprosthetic mitral valve replacement for endocarditis: Will consider an echocardiogram once she is in sinus rhythm.  In addition, her EF was reported to be mildly decreased and that will have to be followed.  3.  Essential hypertension: Blood pressure is reasonably controlled  4.  Sleep apnea: Currently using CPAP.   Disposition:   FU in 1 month  Signed,  [name], MD   04/18/2022 5:38 PM    [place]

## 2022-04-22 ENCOUNTER — Ambulatory Visit: Payer: Medicare HMO | Admitting: Certified Registered"

## 2022-04-22 ENCOUNTER — Ambulatory Visit
Admission: RE | Admit: 2022-04-22 | Discharge: 2022-04-22 | Disposition: A | Discharge status: Home / Self Care | Payer: Medicare HMO | Source: Ambulatory Visit | Attending: Cardiovascular Disease | Admitting: Cardiovascular Disease

## 2022-04-22 ENCOUNTER — Encounter
Admission: RE | Disposition: A | Discharge status: Home / Self Care | Payer: Self-pay | Source: Ambulatory Visit | Attending: Cardiovascular Disease

## 2022-04-22 DIAGNOSIS — G4733 Obstructive sleep apnea (adult) (pediatric): Secondary | ICD-10-CM | POA: Diagnosis not present

## 2022-04-22 DIAGNOSIS — I110 Hypertensive heart disease with heart failure: Secondary | ICD-10-CM | POA: Diagnosis not present

## 2022-04-22 DIAGNOSIS — E785 Hyperlipidemia, unspecified: Secondary | ICD-10-CM | POA: Insufficient documentation

## 2022-04-22 DIAGNOSIS — I509 Heart failure, unspecified: Secondary | ICD-10-CM | POA: Diagnosis not present

## 2022-04-22 DIAGNOSIS — Z952 Presence of prosthetic heart valve: Secondary | ICD-10-CM | POA: Diagnosis not present

## 2022-04-22 DIAGNOSIS — Z87891 Personal history of nicotine dependence: Secondary | ICD-10-CM | POA: Insufficient documentation

## 2022-04-22 DIAGNOSIS — Z8673 Personal history of transient ischemic attack (TIA), and cerebral infarction without residual deficits: Secondary | ICD-10-CM | POA: Insufficient documentation

## 2022-04-22 DIAGNOSIS — Z79899 Other long term (current) drug therapy: Secondary | ICD-10-CM | POA: Insufficient documentation

## 2022-04-22 DIAGNOSIS — I4891 Unspecified atrial fibrillation: Secondary | ICD-10-CM | POA: Diagnosis not present

## 2022-04-22 DIAGNOSIS — Z7901 Long term (current) use of anticoagulants: Secondary | ICD-10-CM | POA: Insufficient documentation

## 2022-04-22 DIAGNOSIS — I4819 Other persistent atrial fibrillation: Secondary | ICD-10-CM | POA: Insufficient documentation

## 2022-04-22 HISTORY — PX: CARDIOVERSION: SHX1299

## 2022-04-22 SURGERY — CARDIOVERSION
Anesthesia: General

## 2022-04-22 MED ORDER — SODIUM CHLORIDE 0.9 % IV SOLN: INTRAVENOUS | Status: DC

## 2022-04-22 MED ORDER — PROPOFOL 10 MG/ML IV BOLUS
INTRAVENOUS | Status: DC | PRN
  Administered 2022-04-22: 60 mg via INTRAVENOUS

## 2022-04-22 NOTE — Anesthesia Preprocedure Evaluation (Signed)
Anesthesia Evaluation  Patient identified by MRN, date of birth, ID band Patient awake    Reviewed: Allergy & Precautions, NPO status , Patient's Chart, lab work & pertinent test results  History of Anesthesia Complications Negative for: history of anesthetic complications  Airway Mallampati: III  TM Distance: >3 FB Neck ROM: full    Dental  (+) Chipped   Pulmonary asthma , sleep apnea , former smoker   Pulmonary exam normal        Cardiovascular hypertension, On Medications + Peripheral Vascular Disease and +CHF  + dysrhythmias Atrial Fibrillation and Supra Ventricular Tachycardia   MVR (10/11/2021) Hx on endocarditis  Echo October 17, 2021 1. The left ventricular systolic function is mildly to moderately decreased, LVEF is visually estimated at 40-45%. 2. Mitral valve replacement ( 27 mm, bioprosthetic, implantation date: 10/11/2021). 3. Mitral valve Doppler indices are consistent with normal prosthetic valve function. 4. The right ventricle is normal in size, with low normal systolic function.     Neuro/Psych  PSYCHIATRIC DISORDERS Anxiety      Neuromuscular disease CVA    GI/Hepatic negative GI ROS, Neg liver ROS,,,  Endo/Other  negative endocrine ROS    Renal/GU Renal disease  negative genitourinary   Musculoskeletal   Abdominal   Peds  Hematology negative hematology ROS (+)   Anesthesia Other Findings Past Medical History: No date: Allergy No date: Anxiety No date: Arthritis No date: Asthma No date: Chronic back pain No date: Depression No date: Diastolic dysfunction     Comment:  a. 02/2020 Echo: EF 60-65%, no rwma. Gr2 DD. Nl RV               size/fxn. Mild LAE. Mild MR. No date: Diverticulitis No date: Hyperlipidemia No date: Hypertension No date: Lipoma No date: Mild mitral regurgitation     Comment:  a. 08/2016 Echo: mild MR; b. 02/2020 Echo: Mild MR. No date: Neuromuscular disorder ([place]) No  date: Obesity No date: Obstructive sleep apnea No date: PAF (paroxysmal atrial fibrillation) (HCC)     Comment:  a. Dx 05/2016--> Xarelto (CHA2DS2VASc = 3); b. 08/2016               Echo: EF 55-60%, no rwma, mild MR, mod dil LA; c. 03/2020              Zio: Avg HR 61 w/ Afib burden of 24% - max rate 189 (avg               93). 2 pauses - longest 4.7 secs post-conversion pauses               (occurred @ 5:42 am). No date: Paroxysmal SVT (supraventricular tachycardia) No date: Sleep apnea No date: Stroke [facility])     Comment:  a. 02/2020 MRI/A: multifocal acute ischemia in R MCA               territory predominantly involving post insula and R               middle frontal gyrus. Nl MRA. No date: Tachy-brady syndrome (HCC) No date: Tobacco abuse  Past Surgical History: No date: ACHILLES TENDON SURGERY; Left No date: BACK SURGERY     Comment:  2 ruptured discs in L spine, no hardware No date: KNEE ARTHROSCOPY W/ MENISCAL REPAIR; Right     Comment:  in the 1990s No date: SALPINGOOPHORECTOMY; Bilateral No date: TUMOR REMOVAL     Comment:  neck, ?lipoma  BMI  Body Mass Index: 36.11 kg/m      Reproductive/Obstetrics negative OB ROS                             Anesthesia Physical Anesthesia Plan  ASA: 4  Anesthesia Plan: General   Post-op Pain Management: Minimal or no pain anticipated   Induction: Intravenous  PONV Risk Score and Plan: Propofol infusion and TIVA  Airway Management Planned: Natural Airway and Nasal Cannula  Additional Equipment:   Intra-op Plan:   Post-operative Plan:   Informed Consent: I have reviewed the patients History and Physical, chart, labs and discussed the procedure including the risks, benefits and alternatives for the proposed anesthesia with the patient or authorized representative who has indicated his/her understanding and acceptance.     Dental Advisory Given  Plan Discussed with: Anesthesiologist, CRNA and  Surgeon  Anesthesia Plan Comments: (Patient consented for risks of anesthesia including but not limited to:  - adverse reactions to medications - risk of airway placement if required - damage to eyes, teeth, lips or other oral mucosa - nerve damage due to positioning  - sore throat or hoarseness - Damage to heart, brain, nerves, lungs, other parts of body or loss of life  Patient voiced understanding.)        Anesthesia Quick Evaluation

## 2022-04-22 NOTE — Transfer of Care (Signed)
Immediate Anesthesia Transfer of Care Note  Patient: Tina Peters  Procedure(s) Performed: CARDIOVERSION  Patient Location: Short Stay  Anesthesia Type:General  Level of Consciousness: drowsy  Airway & Oxygen Therapy: Patient Spontanous Breathing and Patient connected to nasal cannula oxygen  Post-op Assessment: Report given to RN, Post -op Vital signs reviewed and stable, and Patient moving all extremities  Post vital signs: Reviewed and stable  Last Vitals:  Vitals Value Taken Time  BP 114/56 04/22/22 0743  Temp    Pulse 49 04/22/22 0745  Resp 25 04/22/22 0745  SpO2 90 % 04/22/22 0745  Vitals shown include unvalidated device data.  Last Pain:  Vitals:   04/22/22 0650  TempSrc: Oral      Patients Stated Pain Goal: 8 (usually takes tylenol for pain at home) (86/48/47 2072)  Complications: No notable events documented.

## 2022-04-22 NOTE — CV Procedure (Signed)
Cardioversion note: A standard informed consent was obtained. Timeout was performed. The pads were placed in the anterior posterior fashion. The patient was given propofol by the anesthesia team.  Successful cardioversion was performed with a 100 J. The patient converted to sinus rhythm. Pre-and post EKGs were reviewed. The patient tolerated the procedure with no immediate complications.  Recommendations: Continue same medications and follow-up in 2-3 weeks.  

## 2022-04-22 NOTE — Anesthesia Postprocedure Evaluation (Signed)
Anesthesia Post Note  Patient: Tina Peters  Procedure(s) Performed: CARDIOVERSION  Patient location during evaluation: Specials Recovery Anesthesia Type: General Level of consciousness: awake and alert Pain management: pain level controlled Vital Signs Assessment: post-procedure vital signs reviewed and stable Respiratory status: spontaneous breathing, nonlabored ventilation, respiratory function stable and patient connected to nasal cannula oxygen Cardiovascular status: blood pressure returned to baseline and stable Postop Assessment: no apparent nausea or vomiting Anesthetic complications: no   No notable events documented.   Last Vitals:  Vitals:   04/22/22 0743 04/22/22 0745  BP: (!) 114/56 (!) 105/55  Pulse: (!) 50 (!) 49  Resp: 15 20  Temp:    SpO2: (!) 84% 92%    Last Pain:  Vitals:   04/22/22 0745  TempSrc:   PainSc: 0-No pain                 Ilene Qua

## 2022-04-22 NOTE — Interval H&P Note (Signed)
History and Physical Interval Note:  04/22/2022 7:59 AM  Tina Peters  has presented today for surgery, with the diagnosis of Cardioversion   Afib.  The various methods of treatment have been discussed with the patient and family. After consideration of risks, benefits and other options for treatment, the patient has consented to  Procedure(s): CARDIOVERSION (N/A) as a surgical intervention.  The patient's history has been reviewed, patient examined, no change in status, stable for surgery.  I have reviewed the patient's chart and labs.  Questions were answered to the patient's satisfaction.     [name]

## 2022-04-23 ENCOUNTER — Encounter: Payer: Self-pay | Admitting: Cardiovascular Disease

## 2022-05-18 DIAGNOSIS — G4733 Obstructive sleep apnea (adult) (pediatric): Secondary | ICD-10-CM | POA: Diagnosis not present

## 2022-05-18 DIAGNOSIS — I10 Essential (primary) hypertension: Secondary | ICD-10-CM | POA: Diagnosis not present

## 2022-05-27 ENCOUNTER — Other Ambulatory Visit: Payer: Self-pay

## 2022-05-27 DIAGNOSIS — I482 Chronic atrial fibrillation, unspecified: Secondary | ICD-10-CM

## 2022-05-27 DIAGNOSIS — I10 Essential (primary) hypertension: Secondary | ICD-10-CM

## 2022-05-27 DIAGNOSIS — I471 Supraventricular tachycardia, unspecified: Secondary | ICD-10-CM

## 2022-05-29 ENCOUNTER — Telehealth: Payer: Self-pay

## 2022-05-29 NOTE — Progress Notes (Unsigned)
Care Management & Coordination Services Pharmacy Assistant    Name: Tina Peters  MRN: [id number] DOB: October 10, 1950  Reason for Encounter: Appointment Reminder  Chart review: Conditions to be addressed/monitored: Atrial Fibrillation, HTN, HLD, CKD Stage 3, Anxiety, Depression, Asthma, Osteoarthritis, and Paroxysmal SVT, Chronic Venous Insufficiency, PAC, PAT, Acute Bacterial endocarditis, Heart Failure with reduced ejection fraction,Limb Ischemia, Pulmonary Edema Cardiac cause, OSA, Peripheral Neuropathy, Lumbar Radiculopathy,   Recent office visits:  04/09/2022 Tina Kirschner, PA-C (PCP Office Visit) for Primary Hypertension- No medication changes noted, No orders placed, Patient to follow-up in 6 months  01/22/2022 Tina Shaggy, LPN (PCP Clinical Support Visit) for Medicare Annual Wellness Exam- No medication changes noted, No orders placed,   01/08/2022 Tina Kirschner, PA-C (PCP Office Visit) for A-Fib- Started: Gabapentin 100 mg at bedtime prn, No orders placed, BP 145/61, Patient to follow-up in 3 months  Recent consult visits:  04/18/2022 Tina Sacramento, MD (Cardiology) for Persistent A-Fib- No medication changes noted, Lab orders placed,   03/21/2022 Tina Sacramento, MD (Cardiology) for Paroxysmal A-Fib- No medication changes noted, Lab orders placed, BP 140/70, Patient to follow-up in 1 month  02/11/2022 Tina Grill, MD  (Vascular Surgery) for Follow-up- No medication changes noted, No orders placed, BP 136/110, Patient to follow-up in 4 months  12/24/2021 Tina Grill, MD  (Cardiology) for Follow-up- No medication changes noted, No orders placed, Patient to follow-up in 4-6 weeks  12/19/2021 Tina Mage, MD (Cardiology) for AF- No medication changes noted, Referral to Cardiac Rehabilitation placed, Patient instructed to follow-up in 3 months  Hospital visits:  Medication Reconciliation was completed by comparing discharge summary, patient's EMR and  Pharmacy list, and upon discussion with patient.  Admitted to the hospital on 04/22/2022 due to Cardioversion. Discharge date was 04/22/2022. Discharged from Tina Peters?Medications Started at [facility] Discharge:?? -Started None ID  Medication Changes at Hospital Discharge: -Changed None ID  Medications Discontinued at Hospital Discharge: -STOP taking: spironolactone 25 MG tablet (ALDACTONE)  Medications that remain the same after Hospital Discharge:??  -All other medications will remain the same.    Medications: Outpatient Encounter Medications as of 05/29/2022  Medication Sig   acetaminophen (TYLENOL) 325 MG tablet Take by mouth. 4 tablets as needed   albuterol (VENTOLIN HFA) 108 (90 Base) MCG/ACT inhaler Inhale 2 puffs into the lungs every 6 (six) hours as needed for wheezing or shortness of breath.   amiodarone (PACERONE) 200 MG tablet Take 1 tablet (200 mg total) by mouth daily.   aspirin 81 MG chewable tablet Chew by mouth.   atorvastatin (LIPITOR) 40 MG tablet Take 1 tablet (40 mg total) by mouth daily.   gabapentin (NEURONTIN) 100 MG capsule TAKE 1 CAPSULE (100 MG TOTAL) BY MOUTH AT BEDTIME AS NEEDED.   hydrocortisone 2.5 % cream Apply 1 application topically as needed.   methocarbamol (ROBAXIN) 500 MG tablet Take 1 tablet (500 mg total) by mouth every 8 (eight) hours as needed.   metoprolol succinate (TOPROL-XL) 50 MG 24 hr tablet Take 1 tablet (50 mg total) by mouth daily.   rivaroxaban (XARELTO) 20 MG TABS tablet Take 1 tablet (20 mg total) by mouth daily with supper. Take with dinner or largest meal of the day   sertraline (ZOLOFT) 50 MG tablet TAKE 1 TABLET BY MOUTH EVERY DAY   No facility-administered encounter medications on file as of 05/29/2022.   Lab Results  Component Value Date/Time   HGBA1C 5.9 (H) 08/16/2021 11:03 AM  HGBA1C 5.7 (H) 03/02/2020 04:45 AM    BP Readings from Last 3 Encounters:  04/22/22 135/81  04/18/22  130/80  04/09/22 126/81   Contacted patient to confirm {visittype:27222} appointment with *** PharmD, on *** at ***. {US [facility] Outreach:[id number]}  Do you have any problems getting your medications? {yes/no:20286} If yes what types of problems are you experiencing? {Problems:27223}  What is your top health concern you would like to discuss at your upcoming visit?   Have you seen any other providers since your last visit with PCP? {yes/no:20286}  Star Rating Drugs:  Atorvastatin 40 mg last filled on 05/05/2022 for a 90-Day supply with [facility]   Care Gaps: Annual wellness visit in last year? Yes  If Diabetic: Last eye exam / retinopathy screening: Last diabetic foot exam:   Lynann Bologna, CPA/CMA Clinical Pharmacist Assistant Phone: [phone]

## 2022-05-31 ENCOUNTER — Ambulatory Visit: Payer: Medicare HMO

## 2022-05-31 DIAGNOSIS — N1831 Chronic kidney disease, stage 3a: Secondary | ICD-10-CM

## 2022-05-31 DIAGNOSIS — I480 Paroxysmal atrial fibrillation: Secondary | ICD-10-CM

## 2022-05-31 DIAGNOSIS — I5020 Unspecified systolic (congestive) heart failure: Secondary | ICD-10-CM

## 2022-05-31 NOTE — Progress Notes (Signed)
Care Management & Coordination Services Pharmacy Note  05/31/2022 Name:  Tina Peters MRN:  [id number] DOB:  Sep 23, 1950  Summary: Patient presents for follow-up consult.   -Patient feeling stronger, but does feel more nervous and on edge after recent cardioversion.   -Unclear diagnosis of HF, but given mildly reduced EF and CKD, recommend initiation of low-dose ACEi/ARB. Patient has upcoming follow-up with cardiology on 1/31, will defer changes until after that visit.   -Patient reports worsening symptoms of urinary frequency. She denies pain or irritation when urinating. Possible symptoms of OAB. Counseled patient on lifestyle modifications including double voiding, scheduled voiding. If symptoms do not improve will schedule PCP follow-up for evaluation of symptoms and for potential OAB.    Recommendations/Changes made from today's visit: Continue current medications for now.   Follow up plan: CPP follow-up 1 month   Subjective: Tina Peters is an 72 y.o. year old female who is a primary patient of Bacigalupo, Dionne Bucy, MD.  The care coordination team was consulted for assistance with disease management and care coordination needs.    Engaged with patient by telephone for follow up visit.  Recent office visits: 04/09/2022 [name], PA-C (PCP Office Visit) for Primary Hypertension- No medication changes noted, No orders placed, Patient to follow-up in 6 months   01/22/2022 [name], LPN (PCP Clinical Support Visit) for Medicare Annual Wellness Exam- No medication changes noted, No orders placed,    01/08/2022 [name], PA-C (PCP Office Visit) for A-Fib- Started: Gabapentin 100 mg at bedtime prn, No orders placed, BP 145/61, Patient to follow-up in 3 months  Recent consult visits: 04/18/2022 [name], MD (Cardiology) for Persistent A-Fib- No medication changes noted, Lab orders placed,    03/21/2022 [name], MD (Cardiology) for Paroxysmal A-Fib-  No medication changes noted, Lab orders placed, BP 140/70, Patient to follow-up in 1 month   02/11/2022 [name], MD  (Vascular Surgery) for Follow-up- No medication changes noted, No orders placed, BP 136/110, Patient to follow-up in 4 months   12/24/2021 [name], MD  (Cardiology) for Follow-up- No medication changes noted, No orders placed, Patient to follow-up in 4-6 weeks   12/19/2021 [name], MD (Cardiology) for AF- No medication changes noted, Referral to Cardiac Rehabilitation placed, Patient instructed to follow-up in 3 months  Hospital visits: Admitted to the hospital on 04/22/2022 due to Cardioversion. Discharge date was 04/22/2022. Discharged from [facility].      Objective:  Lab Results  Component Value Date   CREATININE 1.39 (H) 04/18/2022   BUN 19 04/18/2022   EGFR 49 (L) 11/20/2021   GFRNONAA 41 (L) 04/18/2022   GFRAA >60 07/19/2018   NA 140 04/18/2022   K 4.7 04/18/2022   CALCIUM 8.8 (L) 04/18/2022   CO2 24 04/18/2022   GLUCOSE 102 (H) 04/18/2022    Lab Results  Component Value Date/Time   HGBA1C 5.9 (H) 08/16/2021 11:03 AM   HGBA1C 5.7 (H) 03/02/2020 04:45 AM    Last diabetic Eye exam: No results found for: "HMDIABEYEEXA"  Last diabetic Foot exam: No results found for: "HMDIABFOOTEX"   Lab Results  Component Value Date   CHOL 116 08/16/2021   HDL 39 (L) 08/16/2021   LDLCALC 61 08/16/2021   TRIG 82 08/16/2021   CHOLHDL 3.0 08/16/2021       Latest Ref Rng & Units 03/21/2022   12:49 PM 11/20/2021   11:17 AM 09/06/2021    3:31 PM  Hepatic Function  Total Protein 6.5 - 8.1 g/dL 7.8  7.5  7.1   Albumin 3.5 - 5.0 g/dL 4.1  4.1  3.7   AST 15 - 41 U/L '24  20  27   '$ ALT 0 - 44 U/L '16  8  16   '$ Alk Phosphatase 38 - 126 U/L 87  105  107   Total Bilirubin 0.3 - 1.2 mg/dL 1.0  0.5  0.4     Lab Results  Component Value Date/Time   TSH 2.083 03/21/2022 12:49 PM   TSH 2.563 03/02/2020 04:45 AM    TSH 2.682 07/15/2014 03:50 PM       Latest Ref Rng & Units 04/18/2022    2:57 PM 03/21/2022   12:49 PM 11/20/2021   11:17 AM  CBC  WBC 4.0 - 10.5 K/uL 8.9  9.1  9.1   Hemoglobin 12.0 - 15.0 g/dL 12.4  11.5  11.9   Hematocrit 36.0 - 46.0 % 41.0  36.9  37.5   Platelets 150 - 400 K/uL 261  254  276     No results found for: "VD25OH", "VITAMINB12"  Clinical ASCVD: Yes  The ASCVD Risk score (Arnett DK, et al., 2019) failed to calculate for the following reasons:   The patient has a prior MI or stroke diagnosis       04/09/2022    4:44 PM 01/22/2022    4:06 PM 01/08/2022    4:15 PM  Depression screen PHQ 2/9  Decreased Interest 0 0 0  Down, Depressed, Hopeless 0 0 0  PHQ - 2 Score 0 0 0  Altered sleeping 3 0 1  Tired, decreased energy 0 0 0  Change in appetite 0 0 0  Feeling bad or failure about yourself  0 0 0  Trouble concentrating 0 0 0  Moving slowly or fidgety/restless 0 0 0  Suicidal thoughts 0 0 0  PHQ-9 Score 3 0 1  Difficult doing work/chores Not difficult at all Not difficult at all Not difficult at all     Social History   Tobacco Use  Smoking Status Former   Packs/day: 0.25   Years: 40.00   Total pack years: 10.00   Types: Cigarettes   Quit date: 2015   Years since quitting: 9.0  Smokeless Tobacco Never  Tobacco Comments   Used to smoke heavily, cut back in 2015   BP Readings from Last 3 Encounters:  04/22/22 135/81  04/18/22 130/80  04/09/22 126/81   Pulse Readings from Last 3 Encounters:  04/22/22 (!) 55  04/18/22 (!) 109  04/09/22 (!) 115   Wt Readings from Last 3 Encounters:  04/22/22 217 lb (98.4 kg)  04/18/22 217 lb 4 oz (98.5 kg)  04/09/22 215 lb 9.6 oz (97.8 kg)   BMI Readings from Last 3 Encounters:  04/22/22 36.11 kg/m  04/18/22 36.15 kg/m  04/09/22 35.88 kg/m    No Known Allergies  Medications Reviewed Today     Reviewed by Rhina Brackett, RN (Registered Nurse) on 04/22/22 at [phone]  Med List Status: <None>   Medication  Order Taking? Sig Documenting Provider Last Dose Status Informant  acetaminophen (TYLENOL) 325 MG tablet [id number] Yes Take by mouth. 4 tablets as needed [provider] 04/21/2022 Active Multiple Informants  albuterol (VENTOLIN HFA) 108 (90 Base) MCG/ACT inhaler [id number] Yes Inhale 2 puffs into the lungs every 6 (six) hours as needed for wheezing or shortness of breath. Virginia Crews, MD 04/21/2022 Active   amiodarone (PACERONE) 200 MG  tablet [id number] Yes Take 1 tablet (200 mg total) by mouth daily. Wellington Hampshire, MD 04/21/2022 Active   aspirin 81 MG chewable tablet [id number] Yes Chew by mouth. [provider] 04/21/2022 Active   atorvastatin (LIPITOR) 40 MG tablet [id number] Yes Take 1 tablet (40 mg total) by mouth daily. Birdie Sons, MD 04/21/2022 Active   gabapentin (NEURONTIN) 100 MG capsule [id number] Yes TAKE 1 CAPSULE (100 MG TOTAL) BY MOUTH AT BEDTIME AS NEEDED. Virginia Crews, MD 04/21/2022 Active   hydrocortisone 2.5 % cream [id number] Yes Apply 1 application topically as needed. [provider] Past Month Active   methocarbamol (ROBAXIN) 500 MG tablet [id number] Yes Take 1 tablet (500 mg total) by mouth every 8 (eight) hours as needed. Birdie Sons, MD 04/21/2022 Active   metoprolol succinate (TOPROL-XL) 50 MG 24 hr tablet [id number] Yes Take 1 tablet (50 mg total) by mouth daily. Vickie Epley, MD 04/21/2022 Active   rivaroxaban (XARELTO) 20 MG TABS tablet [id number] Yes Take 1 tablet (20 mg total) by mouth daily with supper. Take with dinner or largest meal of the day [name], PA-C 04/21/2022 Active   sertraline (ZOLOFT) 50 MG tablet [id number] Yes TAKE 1 TABLET BY MOUTH EVERY DAY Virginia Crews, MD 04/21/2022 Active   spironolactone (ALDACTONE) 25 MG tablet [id number]  TAKE 1/2 TABLET BY MOUTH EVERY DAY  Patient not taking: Reported on 04/18/2022   Virginia Crews, MD  Active             SDOH:  (Social  Determinants of Health) assessments and interventions performed: Yes SDOH Interventions    Flowsheet Row Clinical Support from 01/22/2022 in [place] Office Visit from 01/08/2022 in [place] Office Visit from 08/16/2021 in [place]  SDOH Interventions     Food Insecurity Interventions Intervention Not Indicated -- --  Housing Interventions Intervention Not Indicated -- --  Transportation Interventions Intervention Not Indicated -- --  Utilities Interventions Intervention Not Indicated -- --  Alcohol Usage Interventions Intervention Not Indicated (Score <7) -- --  Depression Interventions/Treatment  -- Currently on Treatment Medication  Financial Strain Interventions Intervention Not Indicated -- --  Physical Activity Interventions Intervention Not Indicated -- --  Stress Interventions Intervention Not Indicated -- --  Social Connections Interventions Intervention Not Indicated -- --       Medication Assistance: None required.  Patient affirms current coverage meets needs.  Medication Access: Within the past 30 days, how often has patient missed a dose of medication? None Is a pillbox or other method used to improve adherence? Yes  Factors that may affect medication adherence? no barriers identified Are meds synced by current pharmacy? No  Are meds delivered by current pharmacy? Yes  Does patient experience delays in picking up medications due to transportation concerns? No   [facility] Reviewed: Is patient disadvantaged to use [facility]?: Yes  Current Rx insurance plan: [employer] Name and location of Current pharmacy:  [facility] ([place], [place] [facility] [address] [place] [address] [place] [address] Phone: [phone] Fax: [place], [place]  [place] [address] Phone: [phone] Fax: [phone]  [id number] [place] [place] [id number]: [phone]: [phone] [facility] services reviewed with patient  today?: No  Patient requests to transfer care to [facility]?: No  Reason patient declined to change pharmacies: Disadvantaged due to insurance/mail order  Compliance/Adherence/Medication fill history: Care Gaps: Covid  Shingrix  Colonoscopy   Star-Rating Drugs: Atorvastatin 40 mg last filled on 05/05/2022 for a 90-Day supply with [facility]   Assessment/Plan  Hyperlipidemia: (LDL goal < 70) -Controlled -Current treatment: Atorvastatin 40 mg daily  -Medications previously tried: NA  -Recommended to continue current medication  Persistent Atrial Fibrillation (Goal: prevent stroke and major bleeding) -Controlled -CHADSVASC: 5 -Current treatment: Rate control:  Amiodarone 200 mg daily  Metoprolol XL 50 mg daily  Anticoagulation: Xarelto 20 mg daily  -Medications previously tried: NA -Patient feeling stronger, but does feel more nervous and on edge after recent cardioversion.  -Recommended to continue current medication  Heart Failure (Goal: manage symptoms and prevent exacerbations) -Not ideally controlled -Last ejection fraction: 40-45% (Date: Jun 2023) - Post Op  -HF type: HFmrEF (mildly reduced EF 41-49%) -NYHA Class: I (no actitivty limitation) -AHA HF Stage: B (Heart disease present - no symptoms present) -Current treatment: Metoprolol XL 50 mg daily  -Medications previously tried: Spironolactone (Hypotension)   -Current home BP/HR readings: 124/79,  -Reports hypotension symptoms: occasional dizziness -Unclear diagnosis of HF, but given mildly reduced EF and CKD, recommend initiation of low-dose ACEi/ARB. Patient has upcoming follow-up with cardiology on 1/31, will defer changes until after that visit.    Depression/Anxiety (Goal: Maintain symptom remission) -Controlled -Current treatment: Sertraline 50 mg daily  -Medications previously tried/failed: NA -GAD7: 2 -Feels more nervous and on edge after recent cardioversion.  -Does report some challenges with sleeping.  -Recommended to continue current medication  Urinary Frequency (Goal: Improve symptoms) -Uncontrolled -Current treatment  None -Medications previously tried: NA -Patient reports worsening symptoms of urinary frequency. She denies pain or irritation when urinating. Possible symptoms of OAB. Counseled patient on lifestyle modifications including double voiding, scheduled voiding. If symptoms do not improve will schedule PCP follow-up for evaluation of symptoms and for potential OAB.   -Counseled on diet and exercise extensively   Chronic Kidney Disease Stage 3a-3b  -All medications assessed for renal dosing and appropriateness in chronic kidney disease. -Recommended to continue current medication  Junius Argyle, PharmD, [place], [place] [phone]

## 2022-06-05 ENCOUNTER — Encounter: Payer: Self-pay | Admitting: Nurse Practitioner

## 2022-06-05 ENCOUNTER — Ambulatory Visit: Payer: Medicare HMO | Attending: Nurse Practitioner | Admitting: Nurse Practitioner

## 2022-06-05 VITALS — BP 158/60 | HR 48 | Ht 65.0 in | Wt 225.2 lb

## 2022-06-05 DIAGNOSIS — I429 Cardiomyopathy, unspecified: Secondary | ICD-10-CM

## 2022-06-05 DIAGNOSIS — I4819 Other persistent atrial fibrillation: Secondary | ICD-10-CM

## 2022-06-05 DIAGNOSIS — N1831 Chronic kidney disease, stage 3a: Secondary | ICD-10-CM | POA: Diagnosis not present

## 2022-06-05 DIAGNOSIS — R9431 Abnormal electrocardiogram [ECG] [EKG]: Secondary | ICD-10-CM | POA: Diagnosis not present

## 2022-06-05 DIAGNOSIS — G4730 Sleep apnea, unspecified: Secondary | ICD-10-CM | POA: Diagnosis not present

## 2022-06-05 DIAGNOSIS — Z953 Presence of xenogenic heart valve: Secondary | ICD-10-CM

## 2022-06-05 DIAGNOSIS — I10 Essential (primary) hypertension: Secondary | ICD-10-CM

## 2022-06-05 MED ORDER — LOSARTAN POTASSIUM 25 MG PO TABS: 25.0000 mg | ORAL_TABLET | Freq: Every day | ORAL | 3 refills | Status: DC

## 2022-06-05 NOTE — Progress Notes (Signed)
Office Visit    Patient Name: Tina Peters Date of Encounter: 06/05/2022  Primary Care Provider:  Virginia Crews, MD Primary Cardiologist:  [name], MD  Chief Complaint    72 year old female with history of paroxysmal atrial fibrillation, tachybradycardia syndrome, PSVT, hypertension, hyperlipidemia, stroke, obstructive sleep apnea on CPAP, CKD III, endocarditis s/p MVR, asthma, tobacco abuse, back pain, and diverticulitis, who presents for follow-up after recent cardioversion.  Past Medical History    Past Medical History:  Diagnosis Date   Allergy    Anxiety    Arthritis    Asthma    Chronic back pain    Depression    Diastolic dysfunction    a. 02/2020 Echo: EF 60-65%, no rwma. Gr2 DD. Nl RV size/fxn. Mild LAE. Mild MR.   Diverticulitis    Endocarditis    a. 10/2021 S sanguinous bacteremia w/ MV veg-->32m Mitris bioprosthetic valve, Maze, and LAA clip.   Hyperlipidemia    Hypertension    Lipoma    Mild mitral regurgitation    a. 08/2016 Echo: mild MR; b. 02/2020 Echo: Mild MR.   Neuromuscular disorder (HCC)    Obesity    Obstructive sleep apnea    PAF (paroxysmal atrial fibrillation) ([facility]    a. Dx 05/2016--> Xarelto (CHA2DS2VASc = 3); b. 08/2016 Echo: EF 55-60%, no rwma, mild MR, mod dil LA; c. 03/2020 Zio: Avg HR 61 w/ Afib burden of 24% - max rate 189 (avg 93). 2 pauses - longest 4.7 secs post-conversion pauses (occurred @ 5:42 am).   Paroxysmal SVT (supraventricular tachycardia)    Sleep apnea    Stroke ([facility]    a. 02/2020 MRI/A: multifocal acute ischemia in R MCA territory predominantly involving post insula and R middle frontal gyrus. Nl MRA.   Tachy-brady syndrome ([facility]    Tobacco abuse    Past Surgical History:  Procedure Laterality Date   ACHILLES TENDON SURGERY Left    BACK SURGERY     2 ruptured discs in L spine, no hardware   CARDIOVERSION N/A 04/22/2022   Procedure: CARDIOVERSION;  Surgeon: AWellington Hampshire MD;  Location: [facility]  ORS;  Service: Cardiovascular;  Laterality: N/A;   KNEE ARTHROSCOPY W/ MENISCAL REPAIR Right    in the 1990s   SALPINGOOPHORECTOMY Bilateral    TUMOR REMOVAL     neck, ?lipoma    Allergies  No Known Allergies  History of Present Illness    72year old female with history of paroxysmal atrial fibrillation, tachybradycardia syndrome, PSVT, hypertension, hyperlipidemia, stroke, obstructive sleep apnea on CPAP, CKD III, endocarditis s/p MVR, asthma, tobacco abuse, back pain, and diverticulitis.  She had an episode of SVT thousand 16 in the setting of hypokalemia, thought to be due to treatment with HCTZ.  She was placed on low-dose, long-acting diltiazem with improvement in symptoms and this was subsequently switched to metoprolol due to leg edema.  In January 2018, she was diagnosed with atrial fibrillation with rapid ventricular response, which converted to sinus rhythm on diltiazem.  She was placed on Xarelto in the setting of a CHA2DS2-VASc of 3 at that time.  In October 2021, she was seen in cardiology clinic and reported an episode of presyncope associate with acute confusion and loss of fine motor skills.  She was referred to the emergency department, where head CT showed no evidence of bleeding however, focal hypodensity and loss of gray-white differentiation in the high right frontal lobe is present concerning for acute or early subacute infarct.  MRI was performed and showed multifocal acute ischemia in the right MCA territory predominantly involving the posterior insula and right middle frontal gyrus.  MRA and carotid Dopplers were unremarkable.  EEG showed no abnormality concerning for potential seizure.  She was subsequently cleared to resume Xarelto by neurology.  ZIO monitoring following discharge showed predominantly sinus rhythm with a 24% A-fib burden.  She was noted to have a 4.7 postconversion pause.  She was seen by electrophysiology with recommendation to start Multaq in effort to  avoid recurrent A-fib and thus postconversion pauses.  In June 2023, she was admitted to [facility] with Streptococcus sanguinous bacteremia from infected tooth and was found to have a mitral valve vegetation.  Cardiac catheterization for surgery showed mild nonobstructive CAD.  She had a prolonged hospitalization underwent mitral valve replacement with 27 mm Mitris bioprosthetic valve, Maze procedure, left atrial appendage clip.  Postoperatively, she had acute left lower extremity ischemia which required left common femoral artery exposure, thrombectomy of the left iliac, common femoral, superficial femoral, and profunda arteries as well as 4 compartment fasciotomy.  Postop echo showed EF 40 to 45%.  She also had postoperative atrial fibrillation which required cardioversion.  She was switched from Multaq to amiodarone during hospitalization.  Follow-up office visit in November 2023, she was found to be in atrial tachycardia versus atypical atrial flutter with rapid response.  Amiodarone was increased to 200 mg twice daily and she remains tachycardic at follow-up office visit on April 18, 2022.  She subsequently underwent successful cardioversion on April 22, 2022 (100 J).  Since cardioversion, she has done well.  She is unaware of any recurrent palpitations and denies chest pain.  She has some degree of chronic dyspnea on exertion.  She is in sinus rhythm today and remains on amiodarone 200 mg daily as well as Xarelto 20 mg daily.  She denies palpitations, PND, orthopnea, dizziness, syncope, edema, or early satiety.  She sometimes checks her blood pressure at home and is typically in the 140s.  Home Medications    Current Outpatient Medications  Medication Sig Dispense Refill   acetaminophen (TYLENOL) 325 MG tablet Take by mouth. 4 tablets as needed     albuterol (VENTOLIN HFA) 108 (90 Base) MCG/ACT inhaler Inhale 2 puffs into the lungs every 6 (six) hours as needed for wheezing or shortness of breath.  18 g 3   amiodarone (PACERONE) 200 MG tablet Take 1 tablet (200 mg total) by mouth daily. 37 tablet 1   aspirin 81 MG chewable tablet Chew by mouth.     atorvastatin (LIPITOR) 40 MG tablet Take 1 tablet (40 mg total) by mouth daily. 90 tablet 4   gabapentin (NEURONTIN) 100 MG capsule TAKE 1 CAPSULE (100 MG TOTAL) BY MOUTH AT BEDTIME AS NEEDED. (Patient taking differently: Take 100 mg by mouth at bedtime.) 90 capsule 0   hydrocortisone 2.5 % cream Apply 1 application topically as needed.     methocarbamol (ROBAXIN) 500 MG tablet Take 1 tablet (500 mg total) by mouth every 8 (eight) hours as needed. 90 tablet 4   metoprolol succinate (TOPROL-XL) 50 MG 24 hr tablet Take 1 tablet (50 mg total) by mouth daily. 30 tablet 11   rivaroxaban (XARELTO) 20 MG TABS tablet Take 1 tablet (20 mg total) by mouth daily with supper. Take with dinner or largest meal of the day 90 tablet 1   sertraline (ZOLOFT) 50 MG tablet TAKE 1 TABLET BY MOUTH EVERY DAY 90 tablet 1   No  current facility-administered medications for this visit.     Review of Systems    Symptom of chronic dyspnea exertion.  She denies chest pain, palpitations, dyspnea, PND, orthopnea, dizziness, syncope, edema, or early satiety.  All other systems reviewed and are otherwise negative except as noted above.    Physical Exam    VS:  BP (!) 168/64 (BP Location: Left Arm, Patient Position: Sitting, Cuff Size: Large)   Pulse (!) 48   Ht '5\' 5"'$  (1.651 m)   Wt 225 lb 3.2 oz (102.2 kg)   SpO2 98%   BMI 37.48 kg/m  , BMI Body mass index is 37.48 kg/m.     Vitals:   06/05/22 1503 06/05/22 1647  BP: (!) 168/64 (!) 158/60  Pulse: (!) 48   SpO2: 98%     GEN: Well nourished, well developed, in no acute distress. HEENT: normal. Neck: Supple, no JVD, carotid bruits, or masses. Cardiac: RRR, no murmurs, rubs, or gallops. No clubbing, cyanosis, edema.  Radials 2+/PT 2+ and equal bilaterally.  Respiratory:  Respirations regular and unlabored, clear  to auscultation bilaterally. GI: Soft, nontender, nondistended, BS + x 4. MS: no deformity or atrophy. Skin: warm and dry, no rash. Neuro:  Strength and sensation are intact. Psych: Normal affect.  Accessory Clinical Findings    ECG personally reviewed by me today -sinus bradycardia, 48, leftward axis.  QTc 475 ms- no acute changes.  Lab Results  Component Value Date   WBC 8.9 04/18/2022   HGB 12.4 04/18/2022   HCT 41.0 04/18/2022   MCV 91.1 04/18/2022   PLT 261 04/18/2022   Lab Results  Component Value Date   CREATININE 1.39 (H) 04/18/2022   BUN 19 04/18/2022   NA 140 04/18/2022   K 4.7 04/18/2022   CL 114 (H) 04/18/2022   CO2 24 04/18/2022   Lab Results  Component Value Date   ALT 16 03/21/2022   AST 24 03/21/2022   ALKPHOS 87 03/21/2022   BILITOT 1.0 03/21/2022   Lab Results  Component Value Date   CHOL 116 08/16/2021   HDL 39 (L) 08/16/2021   LDLCALC 61 08/16/2021   TRIG 82 08/16/2021   CHOLHDL 3.0 08/16/2021    Lab Results  Component Value Date   HGBA1C 5.9 (H) 08/16/2021    Assessment & Plan    1.  Persistent atrial fibrillation: Status post repeat cardioversion in December 2023 in the setting of atrial tachycardia versus atrial flutter.  Maintaining sinus rhythm on amiodarone 200 mg and Toprol-XL 50 mg daily, and anticoagulated with Xarelto 20 mg daily.  She is bradycardic at 48 bpm but asymptomatic, and therefore we will continue current regimen.  Creatinine clearance at actual weight based on creatinine of 1.39 in December 2023, calculates to 59.81.  Will have to watch this closely going forward in the setting of Xarelto therapy, as dose will need to be reduced to 50 mg daily if creatinine clearance less than 51.  2.  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  Now that she is back in sinus rhythm, will arrange for repeat echo to  reevaluate valve and cardiomyopathy.  3.  Cardiomyopathy: See #2.  EF 40 to 45% following bioprosthetic mitral valve replacement.  Plan for repeat echo.  Euvolemic on examination.  She is on beta-blocker.  Adding low-dose ARB therapy in the setting of ongoing hypertension and chronic kidney disease.  4.  Essential hypertension: Typically runs in the 140s at home.  Markedly elevated today.  Adding losartan 25 mg daily with plan for follow-up basic metabolic panel next week.  5.  Stage III chronic kidney disease: Creatinine 1.39 December 14.  In the setting of hypertension, adding ARB.  Follow-up basic metabolic panel in 1 week.  6.  Prolonged QTc: Overall stable since cardioversion at 475 ms.  She is on amiodarone therapy.  Will follow-up based metabolic panel and magnesium next week.  7.  Obstructive sleep apnea: Compliant with CPAP.  8.  Disposition: Follow-up echocardiogram, basic metabolic panel, and magnesium.  Follow-up in clinic in 3 months or sooner if necessary.  Murray Hodgkins, NP 06/05/2022, 3:34 PM

## 2022-06-05 NOTE — Patient Instructions (Signed)
Medication Instructions:  Your physician has recommended you make the following change in your medication:   START - losartan (COZAAR) 25 MG tablet 25 mg, Daily Take 1 tablet (25 mg total) by mouth daily.  *If you need a refill on your cardiac medications before your next appointment, please call your pharmacy*   Lab Work: Your physician recommends that you return for lab work: BMET & Magnesium  Medical Mall Entrance at [facility] 1st desk on the right to check in (REGISTRATION)  Lab hours: Monday- Friday (7:30 am- 5:30 pm)   If you have labs (blood work) drawn today and your tests are completely normal, you will receive your results only by: [place] (if you have MyChart) OR A paper copy in the mail If you have any lab test that is abnormal or we need to change your treatment, we will call you to review the results.   Testing/Procedures: -None ordered   Follow-Up: At [facility], you and your health needs are our priority.  As part of our continuing mission to provide you with exceptional heart care, we have created designated Provider Care Teams.  These Care Teams include your primary Cardiologist (physician) and Advanced Practice Providers (APPs -  Physician Assistants and Nurse Practitioners) who all work together to provide you with the care you need, when you need it.  We recommend signing up for the patient portal called "MyChart".  Sign up information is provided on this After Visit Summary.  MyChart is used to connect with patients for Virtual Visits (Telemedicine).  Patients are able to view lab/test results, encounter notes, upcoming appointments, etc.  Non-urgent messages can be sent to your provider as well.   To learn more about what you can do with MyChart, go to [email or web address].    Your next appointment:   3 month(s)  Provider:   You may see [name], MD or one of the following Advanced Practice Providers on your designated Care Team:     Murray Hodgkins, NP   Other Instructions -None

## 2022-06-06 ENCOUNTER — Telehealth: Payer: Self-pay | Admitting: *Deleted

## 2022-06-06 NOTE — Telephone Encounter (Signed)
Spoke with patient and reviewed that provider would like her to have repeat echocardiogram. She was agreeable and advised that someone from scheduling would give her a call to set that up. She verbalized understanding with no further questions at this time.

## 2022-06-06 NOTE — Telephone Encounter (Signed)
-----  Message from Theora Gianotti, NP sent at 06/05/2022  5:02 PM EST ----- Tina Peters.  I placed an order for a 2d echo for Tina Peters.  She is s/p MVR and Arida snuck into his last note that he would plan to repeat echo when she is back in sinus rhythm.  Didn't see it there when I first reviewed his note.  Would you pls reach out to let her know that Fletcher Anon would like her to have an echo to re-eval EF and Mitral valve?  Thanks,  Gerald Stabs

## 2022-06-07 ENCOUNTER — Other Ambulatory Visit: Payer: Self-pay | Admitting: Physician Assistant

## 2022-06-07 ENCOUNTER — Other Ambulatory Visit: Payer: Self-pay | Admitting: Family Medicine

## 2022-06-07 ENCOUNTER — Telehealth: Payer: Self-pay | Admitting: Family Medicine

## 2022-06-07 DIAGNOSIS — I480 Paroxysmal atrial fibrillation: Secondary | ICD-10-CM

## 2022-06-07 DIAGNOSIS — G629 Polyneuropathy, unspecified: Secondary | ICD-10-CM

## 2022-06-07 NOTE — Telephone Encounter (Signed)
Requested Prescriptions  Refused Prescriptions Disp Refills   XARELTO 20 MG TABS tablet [Pharmacy Med Name: XARELTO 20 MG TABLET] 90 tablet 1    Sig: TAKE 1 TABLET (20 MG TOTAL) BY MOUTH DAILY WITH SUPPER. TAKE WITH DINNER OR LARGEST MEAL OF THE DAY     Hematology: Anticoagulants - rivaroxaban Failed - 06/07/2022  2:56 PM      Failed - Cr in normal range and within 360 days    Creatinine, Ser  Date Value Ref Range Status  04/18/2022 1.39 (H) 0.44 - 1.00 mg/dL Final         Passed - ALT in normal range and within 360 days    ALT  Date Value Ref Range Status  03/21/2022 16 0 - 44 U/L Final         Passed - AST in normal range and within 360 days    AST  Date Value Ref Range Status  03/21/2022 24 15 - 41 U/L Final         Passed - HCT in normal range and within 360 days    HCT  Date Value Ref Range Status  04/18/2022 41.0 36.0 - 46.0 % Final   Hematocrit  Date Value Ref Range Status  11/20/2021 37.5 34.0 - 46.6 % Final         Passed - HGB in normal range and within 360 days    Hemoglobin  Date Value Ref Range Status  04/18/2022 12.4 12.0 - 15.0 g/dL Final  11/20/2021 11.9 11.1 - 15.9 g/dL Final         Passed - PLT in normal range and within 360 days    Platelets  Date Value Ref Range Status  04/18/2022 261 150 - 400 K/uL Final  11/20/2021 276 150 - 450 x10E3/uL Final         Passed - eGFR is 15 or above and within 360 days    GFR calc Af Amer  Date Value Ref Range Status  07/19/2018 >60 >60 mL/min Final   GFR, Estimated  Date Value Ref Range Status  04/18/2022 41 (L) >60 mL/min Final    Comment:    (NOTE) Calculated using the CKD-EPI Creatinine Equation (2021)    eGFR  Date Value Ref Range Status  11/20/2021 49 (L) >59 mL/min/1.73 Final         Passed - Patient is not pregnant      Passed - Valid encounter within last 12 months    Recent Outpatient Visits           1 month ago Primary hypertension   [facility],  [place], PA-C   5 months ago AF (paroxysmal atrial fibrillation) [facility])   [place] [name], PA-C   6 months ago HFrEF (heart failure with reduced ejection fraction) [facility])   [place] [name], PA-C   7 months ago HFrEF (heart failure with reduced ejection fraction) [facility])   [place] [name], PA-C   9 months ago Chronic diarrhea of unknown origin   [place], Dani Gobble, PA-C       Future Appointments             In 2 months Sharolyn Douglas, Clance Boll, NP [place] at [facility]

## 2022-06-07 NOTE — Telephone Encounter (Signed)
Requested Prescriptions  Pending Prescriptions Disp Refills   gabapentin (NEURONTIN) 100 MG capsule [Pharmacy Med Name: GABAPENTIN 100 MG CAPSULE] 90 capsule 0    Sig: TAKE 1 CAPSULE (100 MG TOTAL) BY MOUTH AT BEDTIME AS NEEDED.     Neurology: Anticonvulsants - gabapentin Failed - 06/07/2022  2:56 PM      Failed - Cr in normal range and within 360 days    Creatinine, Ser  Date Value Ref Range Status  04/18/2022 1.39 (H) 0.44 - 1.00 mg/dL Final         Passed - Completed PHQ-2 or PHQ-9 in the last 360 days      Passed - Valid encounter within last 12 months    Recent Outpatient Visits           1 month ago Primary hypertension   [place], [place], PA-C   5 months ago AF (paroxysmal atrial fibrillation) [facility])   [place] [name], PA-C   6 months ago HFrEF (heart failure with reduced ejection fraction) [facility])   [place] [name], PA-C   7 months ago HFrEF (heart failure with reduced ejection fraction) [facility])   [place] [name], PA-C   9 months ago Chronic diarrhea of unknown origin   [place], Dani Gobble, PA-C       Future Appointments             In 2 months Sharolyn Douglas, Clance Boll, NP [place] at [facility]

## 2022-06-07 NOTE — Telephone Encounter (Signed)
Requested medications are due for refill today.  unsure  Requested medications are on the active medications list.  yes  Last refill. 11/09/2021 #90 4 rf  Future visit scheduled.   Not with provider  Notes to clinic.  Robaxin is not covered.  Pharmacy comment: Alternative Requested:FLEXERIL OR TIZANIDINE COVERED.     Requested Prescriptions  Pending Prescriptions Disp Refills   cyclobenzaprine (FLEXERIL) 5 MG tablet [Pharmacy Med Name: CYCLOBENZAPRINE 5 MG TABLET]  0     Not Delegated - Analgesics:  Muscle Relaxants Failed - 06/07/2022  5:39 PM      Failed - This refill cannot be delegated      Passed - Valid encounter within last 6 months    Recent Outpatient Visits           1 month ago Primary hypertension   [place] [name], PA-C   5 months ago AF (paroxysmal atrial fibrillation) [facility])   [place] [name], PA-C   6 months ago HFrEF (heart failure with reduced ejection fraction) [facility])   [place] [name], PA-C   7 months ago HFrEF (heart failure with reduced ejection fraction) [facility])   [place] [name], PA-C   9 months ago Chronic diarrhea of unknown origin   [place], Dani Gobble, PA-C       Future Appointments             In 2 months Sharolyn Douglas, Clance Boll, NP [place] at [facility]

## 2022-06-10 ENCOUNTER — Telehealth: Payer: Self-pay | Admitting: Cardiovascular Disease

## 2022-06-10 DIAGNOSIS — Z7901 Long term (current) use of anticoagulants: Secondary | ICD-10-CM | POA: Diagnosis not present

## 2022-06-10 DIAGNOSIS — I739 Peripheral vascular disease, unspecified: Secondary | ICD-10-CM | POA: Diagnosis not present

## 2022-06-10 DIAGNOSIS — E785 Hyperlipidemia, unspecified: Secondary | ICD-10-CM | POA: Diagnosis not present

## 2022-06-10 DIAGNOSIS — I743 Embolism and thrombosis of arteries of the lower extremities: Secondary | ICD-10-CM | POA: Diagnosis not present

## 2022-06-10 DIAGNOSIS — Z09 Encounter for follow-up examination after completed treatment for conditions other than malignant neoplasm: Secondary | ICD-10-CM | POA: Diagnosis not present

## 2022-06-10 DIAGNOSIS — I4891 Unspecified atrial fibrillation: Secondary | ICD-10-CM | POA: Diagnosis not present

## 2022-06-10 DIAGNOSIS — I10 Essential (primary) hypertension: Secondary | ICD-10-CM | POA: Diagnosis not present

## 2022-06-10 NOTE — Telephone Encounter (Signed)
cyclobenzaprine (FLEXERIL) 5 MG tablet 30 tablet 1 06/10/2022    Sig - Route: Take 1 tablet (5 mg total) by mouth 3 (three) times daily as needed for muscle spasms. - Oral   Pt is calling wanting a CB from DR B as when went to [facility] med was told by pharmacy that this med was not good for her to take with bypass. Pt concerned fu at [phone]

## 2022-06-10 NOTE — Telephone Encounter (Signed)
Pt c/o medication issue:  1. Name of Medication:  Cyclovenzatrine 5 MG   2. How are you currently taking this medication (dosage and times per day)?   3. Are you having a reaction (difficulty breathing--STAT)?   4. What is your medication issue?   Patient states she has been taking this muscle relaxant, but when she went to pick up a refill the pharmacist had concerns with her taking it as a heart patient on cardiac medications.   Patient also mentions that Murray Hodgkins previously discussed putting her on a kidney medication, but nothing has been called in.

## 2022-06-10 NOTE — Telephone Encounter (Signed)
We don't recommend any muscle relaxers if possible in patients >65 due to dizziness and fall risk.  The one she has been taking is not covered by insurance though, so this is the option available. Would only take prn, not everyday.

## 2022-06-10 NOTE — Telephone Encounter (Signed)
Pt called stating she is not sure why she received a prescription for cyclobenzaprine and was told she should take it due to being a cardiac pt. Nurse informed pt prescription was ordered by Dr. Brita Romp and advised to contact their office. Pt verbalized understanding.

## 2022-06-11 NOTE — Telephone Encounter (Signed)
Patient reports that she went ahead and paid out of pocket for the Robaxin 500 mg. She reports she will not take the flexeril due to pharmacist warning of not good for her heart.

## 2022-06-11 NOTE — Telephone Encounter (Signed)
Noted  

## 2022-06-18 DIAGNOSIS — I10 Essential (primary) hypertension: Secondary | ICD-10-CM | POA: Diagnosis not present

## 2022-06-18 DIAGNOSIS — G4733 Obstructive sleep apnea (adult) (pediatric): Secondary | ICD-10-CM | POA: Diagnosis not present

## 2022-06-27 NOTE — Telephone Encounter (Signed)
Appointment has been scheduled.

## 2022-07-01 ENCOUNTER — Telehealth: Payer: Self-pay

## 2022-07-01 NOTE — Telephone Encounter (Signed)
Care Management & Coordination Services Outreach Note  07/01/2022 Name: Tina Peters MRN: [id number] DOB: Jan 05, 1951  Referred by: Virginia Crews, MD  Patient had a phone appointment scheduled with clinical pharmacist today.  An unsuccessful telephone outreach was attempted today. The patient was referred to the pharmacist for assistance with medications, care management and care coordination.   Patient will NOT be penalized in any way for missing a Care Management & Coordination Services appointment. The no-show fee does not apply.  Junius Argyle, PharmD, Para [place], CPP  Clinical Pharmacist Practitioner  [facility] [phone]

## 2022-07-01 NOTE — Telephone Encounter (Signed)
Copied from [place] [phone]. Topic: Appointment Scheduling - Scheduling Inquiry for Clinic >> Jul 01, 2022  9:57 AM Sabas Sous wrote: Reason for CRM: Pt returned call, she missed appt with the pharmacist. Bonne Dolores calling Alex. Pt needs to be rescheduled.

## 2022-07-01 NOTE — Progress Notes (Unsigned)
Care Management & Coordination Services Pharmacy Note  07/01/2022 Name:  Tina Peters MRN:  [id number] DOB:  03-13-1951  Summary: Patient presents for follow-up consult.   Recommendations/Changes made from today's visit: Continue current medications for now.   Follow up plan: CPP follow-up 1 month   Subjective: Tina Peters is an 72 y.o. year old female who is a primary patient of Bacigalupo, Dionne Bucy, MD.  The care coordination team was consulted for assistance with disease management and care coordination needs.    Engaged with patient by telephone for follow up visit.  Recent office visits: 04/09/2022 [name], PA-C (PCP Office Visit) for Primary Hypertension- No medication changes noted, No orders placed, Patient to follow-up in 6 months   01/22/2022 [name], LPN (PCP Clinical Support Visit) for Medicare Annual Wellness Exam- No medication changes noted, No orders placed,    01/08/2022 [name], PA-C (PCP Office Visit) for A-Fib- Started: Gabapentin 100 mg at bedtime prn, No orders placed, BP 145/61, Patient to follow-up in 3 months  Recent consult visits: 06/10/22: Patient presented to Dr. Haynes Kerns (Vascular) for follow-up.   06/05/22: Patient presented to Dr. Sharolyn Douglas (Cardiology) for follow-up. Losartan 25 mg.   04/18/2022 [name], MD (Cardiology) for Persistent A-Fib- No medication changes noted, Lab orders placed,    03/21/2022 [name], MD (Cardiology) for Paroxysmal A-Fib- No medication changes noted, Lab orders placed, BP 140/70, Patient to follow-up in 1 month   02/11/2022 [name], MD  (Vascular Surgery) for Follow-up- No medication changes noted, No orders placed, BP 136/110, Patient to follow-up in 4 months   12/24/2021 [name], MD  (Cardiology) for Follow-up- No medication changes noted, No orders placed, Patient to follow-up in 4-6 weeks   12/19/2021 [name], MD (Cardiology) for AF- No  medication changes noted, Referral to Cardiac Rehabilitation placed, Patient instructed to follow-up in 3 months  Hospital visits: Admitted to the hospital on 04/22/2022 due to Cardioversion. Discharge date was 04/22/2022. Discharged from [facility].      Objective:  Lab Results  Component Value Date   CREATININE 1.39 (H) 04/18/2022   BUN 19 04/18/2022   EGFR 49 (L) 11/20/2021   GFRNONAA 41 (L) 04/18/2022   GFRAA >60 07/19/2018   NA 140 04/18/2022   K 4.7 04/18/2022   CALCIUM 8.8 (L) 04/18/2022   CO2 24 04/18/2022   GLUCOSE 102 (H) 04/18/2022    Lab Results  Component Value Date/Time   HGBA1C 5.9 (H) 08/16/2021 11:03 AM   HGBA1C 5.7 (H) 03/02/2020 04:45 AM    Last diabetic Eye exam: No results found for: "HMDIABEYEEXA"  Last diabetic Foot exam: No results found for: "HMDIABFOOTEX"   Lab Results  Component Value Date   CHOL 116 08/16/2021   HDL 39 (L) 08/16/2021   LDLCALC 61 08/16/2021   TRIG 82 08/16/2021   CHOLHDL 3.0 08/16/2021       Latest Ref Rng & Units 03/21/2022   12:49 PM 11/20/2021   11:17 AM 09/06/2021    3:31 PM  Hepatic Function  Total Protein 6.5 - 8.1 g/dL 7.8  7.5  7.1   Albumin 3.5 - 5.0 g/dL 4.1  4.1  3.7   AST 15 - 41 U/L '24  20  27   '$ ALT 0 - 44 U/L '16  8  16   '$ Alk Phosphatase 38 - 126 U/L 87  105  107   Total Bilirubin 0.3 - 1.2 mg/dL 1.0  0.5  0.4     Lab Results  Component Value Date/Time   TSH 2.083 03/21/2022 12:49 PM   TSH 2.563 03/02/2020 04:45 AM   TSH 2.682 07/15/2014 03:50 PM       Latest Ref Rng & Units 04/18/2022    2:57 PM 03/21/2022   12:49 PM 11/20/2021   11:17 AM  CBC  WBC 4.0 - 10.5 K/uL 8.9  9.1  9.1   Hemoglobin 12.0 - 15.0 g/dL 12.4  11.5  11.9   Hematocrit 36.0 - 46.0 % 41.0  36.9  37.5   Platelets 150 - 400 K/uL 261  254  276     No results found for: "VD25OH", "VITAMINB12"  Clinical ASCVD: Yes  The ASCVD Risk score (Arnett DK, et al., 2019) failed to calculate for the  following reasons:   The patient has a prior MI or stroke diagnosis       04/09/2022    4:44 PM 01/22/2022    4:06 PM 01/08/2022    4:15 PM  Depression screen PHQ 2/9  Decreased Interest 0 0 0  Down, Depressed, Hopeless 0 0 0  PHQ - 2 Score 0 0 0  Altered sleeping 3 0 1  Tired, decreased energy 0 0 0  Change in appetite 0 0 0  Feeling bad or failure about yourself  0 0 0  Trouble concentrating 0 0 0  Moving slowly or fidgety/restless 0 0 0  Suicidal thoughts 0 0 0  PHQ-9 Score 3 0 1  Difficult doing work/chores Not difficult at all Not difficult at all Not difficult at all     Social History   Tobacco Use  Smoking Status Former   Packs/day: 0.25   Years: 40.00   Total pack years: 10.00   Types: Cigarettes   Quit date: 2015   Years since quitting: 9.1  Smokeless Tobacco Never  Tobacco Comments   Used to smoke heavily, cut back in 2015   BP Readings from Last 3 Encounters:  06/05/22 (!) 158/60  04/22/22 135/81  04/18/22 130/80   Pulse Readings from Last 3 Encounters:  06/05/22 (!) 48  04/22/22 (!) 55  04/18/22 (!) 109   Wt Readings from Last 3 Encounters:  06/05/22 225 lb 3.2 oz (102.2 kg)  04/22/22 217 lb (98.4 kg)  04/18/22 217 lb 4 oz (98.5 kg)   BMI Readings from Last 3 Encounters:  06/05/22 37.48 kg/m  04/22/22 36.11 kg/m  04/18/22 36.15 kg/m    No Known Allergies  Medications Reviewed Today     Reviewed by Theora Gianotti, NP (Nurse Practitioner) on 06/05/22 at 69  Med List Status: <None>   Medication Order Taking? Sig Documenting Provider Last Dose Status Informant  acetaminophen (TYLENOL) 325 MG tablet [id number] Yes Take by mouth. 4 tablets as needed [provider] Taking Active Multiple Informants  albuterol (VENTOLIN HFA) 108 (90 Base) MCG/ACT inhaler [id number] Yes Inhale 2 puffs into the lungs every 6 (six) hours as needed for wheezing or shortness of breath. Virginia Crews, MD Taking Active   amiodarone (PACERONE)  200 MG tablet [id number] Yes Take 1 tablet (200 mg total) by mouth daily. Wellington Hampshire, MD Taking Active   aspirin 81 MG chewable tablet [id number] Yes Chew by mouth. [provider] Taking Active   atorvastatin (LIPITOR) 40 MG tablet [id number] Yes Take 1 tablet (40 mg total) by mouth daily. Birdie Sons, MD Taking Active   gabapentin (NEURONTIN) 100 MG capsule [id number] Yes TAKE 1 CAPSULE (  100 MG TOTAL) BY MOUTH AT BEDTIME AS NEEDED.  Patient taking differently: Take 100 mg by mouth at bedtime.   Virginia Crews, MD Taking Active   hydrocortisone 2.5 % cream [id number] Yes Apply 1 application topically as needed. [provider] Taking Active   losartan (COZAAR) 25 MG tablet [id number] Yes Take 1 tablet (25 mg total) by mouth daily. Theora Gianotti, NP  Active   methocarbamol (ROBAXIN) 500 MG tablet [id number] Yes Take 1 tablet (500 mg total) by mouth every 8 (eight) hours as needed. Birdie Sons, MD Taking Active   metoprolol succinate (TOPROL-XL) 50 MG 24 hr tablet [id number] Yes Take 1 tablet (50 mg total) by mouth daily. Vickie Epley, MD Taking Active   rivaroxaban (XARELTO) 20 MG TABS tablet [id number] Yes Take 1 tablet (20 mg total) by mouth daily with supper. Take with dinner or largest meal of the day Drubel, Ria Comment, PA-C Taking Active   sertraline (ZOLOFT) 50 MG tablet [id number] Yes TAKE 1 TABLET BY MOUTH EVERY DAY Bacigalupo, Dionne Bucy, MD Taking Active             SDOH:  (Social Determinants of Health) assessments and interventions performed: Yes SDOH Interventions    Flowsheet Row Clinical Support from 01/22/2022 in [place] Office Visit from 01/08/2022 in [place] Office Visit from 08/16/2021 in [place]  SDOH Interventions     Food Insecurity Interventions Intervention Not Indicated -- --  Housing Interventions Intervention Not Indicated -- --   Transportation Interventions Intervention Not Indicated -- --  Utilities Interventions Intervention Not Indicated -- --  Alcohol Usage Interventions Intervention Not Indicated (Score <7) -- --  Depression Interventions/Treatment  -- Currently on Treatment Medication  Financial Strain Interventions Intervention Not Indicated -- --  Physical Activity Interventions Intervention Not Indicated -- --  Stress Interventions Intervention Not Indicated -- --  Social Connections Interventions Intervention Not Indicated -- --       Medication Assistance: None required.  Patient affirms current coverage meets needs.  Medication Access: Within the past 30 days, how often has patient missed a dose of medication? None Is a pillbox or other method used to improve adherence? Yes  Factors that may affect medication adherence? no barriers identified Are meds synced by current pharmacy? No  Are meds delivered by current pharmacy? Yes  Does patient experience delays in picking up medications due to transportation concerns? No   [facility] Reviewed: Is patient disadvantaged to use [facility]?: Yes  Current Rx insurance plan: [employer] Name and location of Current pharmacy:  [facility] ([place], [place] [address] [place] [address] [place] [address] Phone: [phone] Fax: [place], [place] [address] Phone: [phone] Fax: [phone]  [id number] [place], [id number]: [phone]: [phone] [facility] services reviewed with patient today?: No  Patient requests to transfer care to [facility]?: No  Reason patient declined to change pharmacies: Disadvantaged due to insurance/mail order  Compliance/Adherence/Medication fill history: Care  Gaps: Covid  Shingrix  Colonoscopy   Star-Rating Drugs: Atorvastatin 40 mg last filled on 05/05/2022 for a 90-Day supply with [facility]   Assessment/Plan  Hyperlipidemia: (LDL goal < 70) -Controlled -Current treatment: Atorvastatin  40 mg daily  -Medications previously tried: NA  -Recommended to continue current medication  Persistent Atrial Fibrillation (Goal: prevent stroke and major bleeding) -Controlled -CHADSVASC: 5 -Current treatment: Rate control:  Amiodarone 200 mg daily  Metoprolol XL 50 mg daily  Anticoagulation: Xarelto 20 mg daily  -Medications previously tried: NA -Recommended to continue current medication  Heart Failure (Goal: manage symptoms and prevent exacerbations) -Not ideally controlled -Last ejection fraction: 40-45% (Date: Jun 2023) - Post Op  -HF type: HFmrEF (mildly reduced EF 41-49%) -NYHA Class: I (no actitivty limitation) -AHA HF Stage: B (Heart disease present - no symptoms present) -Current treatment: Losartan 25 mg daily  Metoprolol XL 50 mg daily  -Medications previously tried: Spironolactone (Hypotension)   -Current home BP/HR readings:  -Reports hypotension symptoms: occasional dizziness  Depression/Anxiety (Goal: Maintain symptom remission) -Controlled -Current treatment: Sertraline 50 mg daily  -Medications previously tried/failed: NA -GAD7: 2 -Feels more nervous and on edge after recent cardioversion.  -Does report some challenges with sleeping.  -Recommended to continue current medication  Urinary Frequency (Goal: Improve symptoms) -Uncontrolled -Current treatment  None -Medications previously tried: NA -Patient reports worsening symptoms of urinary frequency. She denies pain or irritation when urinating. Possible symptoms of OAB. Counseled patient on lifestyle modifications including double voiding, scheduled voiding. If symptoms do not improve will schedule PCP follow-up for evaluation of symptoms and for potential OAB.    -Counseled on diet and exercise extensively   Chronic Kidney Disease Stage 3a-3b  -All medications assessed for renal dosing and appropriateness in chronic kidney disease. -Recommended to continue current medication  Junius Argyle, PharmD, Para [place], [place] [phone]

## 2022-07-04 ENCOUNTER — Telehealth: Payer: Self-pay

## 2022-07-04 NOTE — Progress Notes (Unsigned)
Care Coordination Pharmacy Assistant   Name: Tina Peters  MRN: [id number] DOB: [birth year]  Reason for Encounter: HTN Follow-up/Schedule CPP appointment follow-up  I received a task from Junius Argyle, CPP requesting that I reach out to the patient regarding the following:   Check in with patient for HTN assessment, review last 1-2 weeks of any blood pressure readings.   Cardiology recommended she recheck her kidney function after starting new medication, please let her know to get that checked as soon as possible.   Plan to reschedule patient for 3-4 weeks after HC touchbase.   I called patient, but had to leave a voicemail requesting her to return my call.  07/05/2022   Called patient had to leave voicemail requesting patient to return my call  Medications: Outpatient Encounter Medications as of 07/04/2022  Medication Sig   acetaminophen (TYLENOL) 325 MG tablet Take by mouth. 4 tablets as needed   albuterol (VENTOLIN HFA) 108 (90 Base) MCG/ACT inhaler Inhale 2 puffs into the lungs every 6 (six) hours as needed for wheezing or shortness of breath.   amiodarone (PACERONE) 200 MG tablet Take 1 tablet (200 mg total) by mouth daily.   aspirin 81 MG chewable tablet Chew by mouth.   atorvastatin (LIPITOR) 40 MG tablet Take 1 tablet (40 mg total) by mouth daily.   cyclobenzaprine (FLEXERIL) 5 MG tablet Take 1 tablet (5 mg total) by mouth 3 (three) times daily as needed for muscle spasms.   gabapentin (NEURONTIN) 100 MG capsule TAKE 1 CAPSULE (100 MG TOTAL) BY MOUTH AT BEDTIME AS NEEDED.   hydrocortisone 2.5 % cream Apply 1 application topically as needed.   losartan (COZAAR) 25 MG tablet Take 1 tablet (25 mg total) by mouth daily.   metoprolol succinate (TOPROL-XL) 50 MG 24 hr tablet Take 1 tablet (50 mg total) by mouth daily.   rivaroxaban (XARELTO) 20 MG TABS tablet Take 1 tablet (20 mg total) by mouth daily with supper. Take with dinner or largest meal of the day   sertraline (ZOLOFT)  50 MG tablet TAKE 1 TABLET BY MOUTH EVERY DAY   No facility-administered encounter medications on file as of 07/04/2022.   Lynann Bologna, CPA/CMA Clinical Pharmacist Assistant Phone: [phone]

## 2022-07-16 ENCOUNTER — Other Ambulatory Visit: Payer: Self-pay | Admitting: Physician Assistant

## 2022-07-16 DIAGNOSIS — I480 Paroxysmal atrial fibrillation: Secondary | ICD-10-CM

## 2022-07-17 NOTE — Telephone Encounter (Signed)
Requested Prescriptions  Pending Prescriptions Disp Refills   XARELTO 20 MG TABS tablet [Pharmacy Med Name: XARELTO 20 MG TABLET] 90 tablet 0    Sig: TAKE 1 TABLET (20 MG TOTAL) BY MOUTH DAILY WITH SUPPER. TAKE WITH DINNER OR LARGEST MEAL OF THE DAY     Hematology: Anticoagulants - rivaroxaban Failed - 07/16/2022  6:28 PM      Failed - Cr in normal range and within 360 days    Creatinine, Ser  Date Value Ref Range Status  04/18/2022 1.39 (H) 0.44 - 1.00 mg/dL Final         Passed - ALT in normal range and within 360 days    ALT  Date Value Ref Range Status  03/21/2022 16 0 - 44 U/L Final         Passed - AST in normal range and within 360 days    AST  Date Value Ref Range Status  03/21/2022 24 15 - 41 U/L Final         Passed - HCT in normal range and within 360 days    HCT  Date Value Ref Range Status  04/18/2022 41.0 36.0 - 46.0 % Final   Hematocrit  Date Value Ref Range Status  11/20/2021 37.5 34.0 - 46.6 % Final         Passed - HGB in normal range and within 360 days    Hemoglobin  Date Value Ref Range Status  04/18/2022 12.4 12.0 - 15.0 g/dL Final  11/20/2021 11.9 11.1 - 15.9 g/dL Final         Passed - PLT in normal range and within 360 days    Platelets  Date Value Ref Range Status  04/18/2022 261 150 - 400 K/uL Final  11/20/2021 276 150 - 450 x10E3/uL Final         Passed - eGFR is 15 or above and within 360 days    GFR calc Af Amer  Date Value Ref Range Status  07/19/2018 >60 >60 mL/min Final   GFR, Estimated  Date Value Ref Range Status  04/18/2022 41 (L) >60 mL/min Final    Comment:    (NOTE) Calculated using the CKD-EPI Creatinine Equation (2021)    eGFR  Date Value Ref Range Status  11/20/2021 49 (L) >59 mL/min/1.73 Final         Passed - Patient is not pregnant      Passed - Valid encounter within last 12 months    Recent Outpatient Visits           3 months ago Primary hypertension   [place]  [name], PA-C   6 months ago AF (paroxysmal atrial fibrillation) [facility])   [place] [name], PA-C   8 months ago HFrEF (heart failure with reduced ejection fraction) [facility])   [place] [name], PA-C   8 months ago HFrEF (heart failure with reduced ejection fraction) [facility])   [place] [name], PA-C   10 months ago Chronic diarrhea of unknown origin   [place], Dani Gobble, PA-C       Future Appointments             In 1 month Sharolyn Douglas, Clance Boll, NP [place] at [facility]

## 2022-07-24 ENCOUNTER — Encounter: Payer: Self-pay | Admitting: Nurse Practitioner

## 2022-07-24 ENCOUNTER — Ambulatory Visit: Payer: Medicare HMO | Attending: Nurse Practitioner

## 2022-07-24 DIAGNOSIS — Z953 Presence of xenogenic heart valve: Secondary | ICD-10-CM

## 2022-07-24 LAB — ECHOCARDIOGRAM COMPLETE
AR max vel: 1.93 cm2
AV Area VTI: 2.11 cm2
AV Area mean vel: 1.89 cm2
AV Mean grad: 3.8 mmHg
AV Peak grad: 6.8 mmHg
Ao pk vel: 1.3 m/s
Area-P 1/2: 2.96 cm2
Est EF: 45
MV VTI: 1.29 cm2
S' Lateral: 3.7 cm

## 2022-07-26 ENCOUNTER — Other Ambulatory Visit: Payer: Self-pay | Admitting: *Deleted

## 2022-07-26 MED ORDER — LOSARTAN POTASSIUM 25 MG PO TABS: 25.0000 mg | ORAL_TABLET | Freq: Every day | ORAL | 3 refills | Status: DC

## 2022-08-09 DIAGNOSIS — G4733 Obstructive sleep apnea (adult) (pediatric): Secondary | ICD-10-CM | POA: Diagnosis not present

## 2022-08-09 DIAGNOSIS — I10 Essential (primary) hypertension: Secondary | ICD-10-CM | POA: Diagnosis not present

## 2022-08-12 NOTE — Progress Notes (Unsigned)
Cardiology Office Note Date:  08/13/2022  Patient ID:  Tina, Peters 02/04/1951, MRN [id number] PCP:  Erasmo Downer, MD  Cardiologist:  Lorine Bears, MD Electrophysiologist: Lanier Prude, MD    Chief Complaint: Afib follow-up  History of Present Illness: Tina Peters is a 72 y.o. female with PMH notable for persistent Afib, HTN, OSA on CPAP, CKD stage 3, CVA, HFmrEF, prolonged QTC; seen today for Lanier Prude, MD for routine electrophysiology followup.   Briefly, she has history of Afib with post-conversion pauses. Her Afib had been managed with multaq for some time. In June 2023, she presented to [facility] with strep. 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 S/p MVR, maze, left atrial appendage clip. Complicated, prolonged hospital course. She had post-op afib and was switched to amiodarone during hospitalization. She is s/p DCCV 04/2022 after being in atach vs aflutter w rapid response.  She saw NP Brion Aliment 05/2022 after DCCV and was doing well, no palps, no CP. Was HTN in office, so added losartan. Amiodarone continued at 200mg  BID.  Today, she continues to have decreased stamina and some DOE. Was SOB walking from valet drop off to clinic waiting room. Recovers quickly, but is frustrated compared to how active she used to be.  She has some mild palpitations, but nothing constant. She used to be very symptomatic when in Afib, does not think she is out of rhythm now.   She has only been able to take losartan for the past week d/t issues with pharmacy. Home BP readings have lowered to 130/140s.   No bleeding issues on xarelto  she denies chest pain, orthopnea, nausea, vomiting, dizziness, syncope, edema, weight gain, or early satiety.    AAD History: Multaq - stopped at [facility] post-op AF after MVR Amiodarone   Past Medical History:  Diagnosis Date   Allergy    Anxiety    Arthritis    Asthma    Chronic back pain    Chronic combined systolic  (congestive) and diastolic (congestive) heart failure    a. 02/2020 Echo: EF 60-65%, no rwma. Gr2 DD. Nl RV size/fxn. Mild LAE. Mild MR; b. 10/2021 Echo: [facility] following MVR) EF 40 to 45%.   Depression    Diverticulitis    Endocarditis    a. 10/2021 S sanguinous bacteremia w/ MV veg-->64mm Mitris bioprosthetic valve, Maze, and LAA clip.   Hyperlipidemia    Hypertension    Lipoma    Mild mitral regurgitation    a. 08/2016 Echo: mild MR; b. 02/2020 Echo: Mild MR.   Neuromuscular disorder    Obesity    Obstructive sleep apnea    PAF (paroxysmal atrial fibrillation)    a. Dx 05/2016--> Xarelto (CHA2DS2VASc = 3); b. 08/2016 Echo: EF 55-60%, no rwma, mild MR, mod dil LA; c. 03/2020 Zio: Avg HR 61 w/ Afib burden of 24% - max rate 189 (avg 93). 2 pauses - longest 4.7 secs post-conversion pauses (occurred @ 5:42 am).   Paroxysmal SVT (supraventricular tachycardia)    S/P mitral valve replacement    a. 10/2021 [facility] - Endocarditis/MV Veg-->28mm Mitris bioprosthetic valve, Maze, and LAA clip.   Sleep apnea    Stroke    a. 02/2020 MRI/A: multifocal acute ischemia in R MCA territory predominantly involving post insula and R middle frontal gyrus. Nl MRA.   Tachy-brady syndrome    Tobacco abuse     Past Surgical History:  Procedure Laterality Date   ACHILLES TENDON SURGERY Left  BACK SURGERY     2 ruptured discs in L spine, no hardware   CARDIOVERSION N/A 04/22/2022   Procedure: CARDIOVERSION;  Surgeon: Iran OuchArida, Muhammad A, MD;  Location: [facility] ORS;  Service: Cardiovascular;  Laterality: N/A;   KNEE ARTHROSCOPY W/ MENISCAL REPAIR Right    in the 1990s   SALPINGOOPHORECTOMY Bilateral    TUMOR REMOVAL     neck, ?lipoma    Current Outpatient Medications  Medication Instructions   acetaminophen (TYLENOL) 325 MG tablet Oral, 4 tablets as needed   albuterol (VENTOLIN HFA) 108 (90 Base) MCG/ACT inhaler 2 puffs, Inhalation, Every 6 hours PRN   amiodarone (PACERONE) 200 MG tablet Take 1 tablet by mouth  twice a day for two weeks then resume taking 1 tablet by mouth daily   aspirin 81 mg, Oral, Daily   atorvastatin (LIPITOR) 40 mg, Oral, Daily   cyclobenzaprine (FLEXERIL) 5 mg, Oral, 3 times daily PRN   gabapentin (NEURONTIN) 100 mg, Oral, At bedtime PRN   hydrocortisone 2.5 % cream 1 application , Topical, As needed   losartan (COZAAR) 25 mg, Oral, Daily   metoprolol succinate (TOPROL-XL) 50 mg, Oral, Daily   rivaroxaban (XARELTO) 20 MG TABS tablet TAKE 1 TABLET (20 MG TOTAL) BY MOUTH DAILY WITH SUPPER. TAKE WITH DINNER OR LARGEST MEAL OF THE DAY   sertraline (ZOLOFT) 50 mg, Oral, Daily      Social History:  The patient  reports that she quit smoking about 9 years ago. Her smoking use included cigarettes. She has a 10.00 pack-year smoking history. She has never used smokeless tobacco. She reports current alcohol use. She reports that she does not use drugs.   Family History:  The patient's family history includes Alcoholism in her father; CAD in her father; Diabetes in her mother; Diabetes Mellitus I in her grandson; Fibrocystic breast disease in her mother and sister; Stroke in her mother.  ROS:  Please see the history of present illness. All other systems are reviewed and otherwise negative.   PHYSICAL EXAM:  VS:  BP 130/80 (BP Location: Left Arm, Patient Position: Sitting, Cuff Size: Large)   Pulse (!) 103   Ht 5' 5.5" (1.664 m)   Wt 222 lb (100.7 kg)   SpO2 99%   BMI 36.38 kg/m  BMI: Body mass index is 36.38 kg/m.  GEN- The patient is well appearing, alert and oriented x 3 today.   Lungs- Clear to ausculation bilaterally, normal work of breathing.  Heart- Regular, tachy rate and rhythm, no murmurs, rubs or gallops, Extremities- Trace peripheral edema, warm and dry   EKG is ordered. Personal review of EKG from today shows: Typical counter-clockwise 2:1 and 3:1 Aflutter, rate 103bpm.  Recent Labs: 08/13/2022: ALT 15; BUN 20; Creatinine, Ser 1.20; Hemoglobin 11.8; Platelets  291; Potassium 4.3; Sodium 140; TSH 3.651  08/16/2021: Chol/HDL Ratio 3.0; Cholesterol, Total 116; HDL 39; LDL Chol Calc (NIH) 61; Triglycerides 82   Estimated Creatinine Clearance: 51 mL/min (A) (by C-G formula based on SCr of 1.2 mg/dL (H)).   Wt Readings from Last 3 Encounters:  08/13/22 222 lb (100.7 kg)  06/05/22 225 lb 3.2 oz (102.2 kg)  04/22/22 217 lb (98.4 kg)     Additional studies reviewed include: Previous EP, cardiology notes.   TTE 07/24/2022  1. Left ventricular ejection fraction, by estimation, is 45%. Left ventricular ejection fraction by PLAX is 43 %. The left ventricle has mildly decreased function. The left ventricle demonstrates global hypokinesis. Left ventricular diastolic parameters are indeterminate.  2. Right ventricular systolic function is low normal. The right ventricular size is normal.   3. Left atrial size was mildly dilated.   4. Right atrial size was mildly dilated.   5. The mitral valve has been repaired/replaced. No evidence of mitral valve regurgitation. There is a bioprosthetic valve present in the mitral position.   6. The aortic valve is tricuspid. Aortic valve regurgitation is not visualized. Aortic valve sclerosis/calcification is present, without any evidence of aortic stenosis.   7. The inferior vena cava is normal in size with greater than 50% respiratory variability, suggesting right atrial pressure of 3 mmHg.   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   Long term monitor, 12/19/2021 HR 42 - 150 bpm, average 53 bpm. 25 SVT, longest 13 beats. Rare supraventricular and ventricular ectopy. No sustained arrhythmias. No atrial fibrillation.  LHC, 10/09/2021 ([facility] in [place]) L radial access with US guidance  Mild CAD only  Normal LVEDP.   ASSESSMENT AND PLAN:  #) persistent Afib / Aflutter Converted back to Aflutter at some point between 1/31 and today Patient is unsure when she converted as she does not feel any different  currently Discussed with Dr. Lalla Brothers - will increase amiodarone to 200mg  BID x 14 days, then decrease back to 200mg  daily DCCV in at least 2 weeks - update amio labs today Consider aflutter ablation in future Rec'd maze during MVR  CHA2DS2-VASc Score = 7 [CHF History: 1, HTN History: 1, Diabetes History: 0, Stroke History: 2, Vascular Disease History: 1, Age Score: 1, Gender Score: 1].  Therefore, the patient's annual risk of stroke is 11.2 %. OAC - 20mg  xarelto daily, appropriately dosed for CrCl >43mL/min   #) HFmrEF Appears euvolemic on exam; warm and dry, no lower extremity edema   #) HTN BP readings have improved Continue losartan at current dose for now given she has only taken it about a week Recommended she keep BP log at home Consider up-titration at future appts   Current medicines are reviewed at length with the patient today.   The patient does not have concerns regarding her medicines.  The following changes were made today:   INCREASE amiodarone to 200mg  BID x 14 days, then reduce to 200mg  daily  Labs/ tests ordered today include:  Orders Placed This Encounter  Procedures   TSH   T4, free   Comp Met (CMET)   CBC   EKG 12-Lead     Disposition: Follow up with Dr. Lalla Brothers in  2-3 months     Signed, Sherie Don, NP  08/13/22  4:32 PM  Electrophysiology [facility]

## 2022-08-12 NOTE — H&P (View-Only) (Signed)
 Cardiology Office Note Date:  08/13/2022  Patient ID:  Tina Peters, DOB 07/31/1950, MRN [id number] PCP:  Bacigalupo, Angela M, MD  Cardiologist:  Muhammad Arida, MD Electrophysiologist: CAMERON T LAMBERT, MD    Chief Complaint: Afib follow-up  History of Present Illness: Tina Peters is a 71 y.o. female with PMH notable for persistent Afib, HTN, OSA on CPAP, CKD stage 3, CVA, HFmrEF, prolonged QTC; seen today for CAMERON T LAMBERT, MD for routine electrophysiology followup.   Briefly, she has history of Afib with post-conversion pauses. Her Afib had been managed with multaq for some time. In June 2023, she presented to [facility] with strep. 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 S/p MVR, maze, left atrial appendage clip. Complicated, prolonged hospital course. She had post-op afib and was switched to amiodarone during hospitalization. She is s/p DCCV 04/2022 after being in atach vs aflutter w rapid response.  She saw NP Berge 05/2022 after DCCV and was doing well, no palps, no CP. Was HTN in office, so added losartan. Amiodarone continued at 200mg BID.  Today, she continues to have decreased stamina and some DOE. Was SOB walking from valet drop off to clinic waiting room. Recovers quickly, but is frustrated compared to how active she used to be.  She has some mild palpitations, but nothing constant. She used to be very symptomatic when in Afib, does not think she is out of rhythm now.   She has only been able to take losartan for the past week d/t issues with pharmacy. Home BP readings have lowered to 130/140s.   No bleeding issues on xarelto  she denies chest pain, orthopnea, nausea, vomiting, dizziness, syncope, edema, weight gain, or early satiety.    AAD History: Multaq - stopped at [facility] post-op AF after MVR Amiodarone   Past Medical History:  Diagnosis Date   Allergy    Anxiety    Arthritis    Asthma    Chronic back pain    Chronic combined systolic  (congestive) and diastolic (congestive) heart failure    a. 02/2020 Echo: EF 60-65%, no rwma. Gr2 DD. Nl RV size/fxn. Mild LAE. Mild MR; b. 10/2021 Echo: ([facility] following MVR) EF 40 to 45%.   Depression    Diverticulitis    Endocarditis    a. 10/2021 S sanguinous bacteremia w/ MV veg-->27mm Mitris bioprosthetic valve, Maze, and LAA clip.   Hyperlipidemia    Hypertension    Lipoma    Mild mitral regurgitation    a. 08/2016 Echo: mild MR; b. 02/2020 Echo: Mild MR.   Neuromuscular disorder    Obesity    Obstructive sleep apnea    PAF (paroxysmal atrial fibrillation)    a. Dx 05/2016--> Xarelto (CHA2DS2VASc = 3); b. 08/2016 Echo: EF 55-60%, no rwma, mild MR, mod dil LA; c. 03/2020 Zio: Avg HR 61 w/ Afib burden of 24% - max rate 189 (avg 93). 2 pauses - longest 4.7 secs post-conversion pauses (occurred @ 5:42 am).   Paroxysmal SVT (supraventricular tachycardia)    S/P mitral valve replacement    a. 10/2021 [facility] - Endocarditis/MV Veg-->27mm Mitris bioprosthetic valve, Maze, and LAA clip.   Sleep apnea    Stroke    a. 02/2020 MRI/A: multifocal acute ischemia in R MCA territory predominantly involving post insula and R middle frontal gyrus. Nl MRA.   Tachy-brady syndrome    Tobacco abuse     Past Surgical History:  Procedure Laterality Date   ACHILLES TENDON SURGERY Left      BACK SURGERY     2 ruptured discs in L spine, no hardware   CARDIOVERSION N/A 04/22/2022   Procedure: CARDIOVERSION;  Surgeon: Arida, Muhammad A, MD;  Location: [facility] ORS;  Service: Cardiovascular;  Laterality: N/A;   KNEE ARTHROSCOPY W/ MENISCAL REPAIR Right    in the 1990s   SALPINGOOPHORECTOMY Bilateral    TUMOR REMOVAL     neck, ?lipoma    Current Outpatient Medications  Medication Instructions   acetaminophen (TYLENOL) 325 MG tablet Oral, 4 tablets as needed   albuterol (VENTOLIN HFA) 108 (90 Base) MCG/ACT inhaler 2 puffs, Inhalation, Every 6 hours PRN   amiodarone (PACERONE) 200 MG tablet Take 1 tablet by mouth  twice a day for two weeks then resume taking 1 tablet by mouth daily   aspirin 81 mg, Oral, Daily   atorvastatin (LIPITOR) 40 mg, Oral, Daily   cyclobenzaprine (FLEXERIL) 5 mg, Oral, 3 times daily PRN   gabapentin (NEURONTIN) 100 mg, Oral, At bedtime PRN   hydrocortisone 2.5 % cream 1 application , Topical, As needed   losartan (COZAAR) 25 mg, Oral, Daily   metoprolol succinate (TOPROL-XL) 50 mg, Oral, Daily   rivaroxaban (XARELTO) 20 MG TABS tablet TAKE 1 TABLET (20 MG TOTAL) BY MOUTH DAILY WITH SUPPER. TAKE WITH DINNER OR LARGEST MEAL OF THE DAY   sertraline (ZOLOFT) 50 mg, Oral, Daily      Social History:  The patient  reports that she quit smoking about 9 years ago. Her smoking use included cigarettes. She has a 10.00 pack-year smoking history. She has never used smokeless tobacco. She reports current alcohol use. She reports that she does not use drugs.   Family History:  The patient's family history includes Alcoholism in her father; CAD in her father; Diabetes in her mother; Diabetes Mellitus I in her grandson; Fibrocystic breast disease in her mother and sister; Stroke in her mother.  ROS:  Please see the history of present illness. All other systems are reviewed and otherwise negative.   PHYSICAL EXAM:  VS:  BP 130/80 (BP Location: Left Arm, Patient Position: Sitting, Cuff Size: Large)   Pulse (!) 103   Ht 5' 5.5" (1.664 m)   Wt 222 lb (100.7 kg)   SpO2 99%   BMI 36.38 kg/m  BMI: Body mass index is 36.38 kg/m.  GEN- The patient is well appearing, alert and oriented x 3 today.   Lungs- Clear to ausculation bilaterally, normal work of breathing.  Heart- Regular, tachy rate and rhythm, no murmurs, rubs or gallops, Extremities- Trace peripheral edema, warm and dry   EKG is ordered. Personal review of EKG from today shows: Typical counter-clockwise 2:1 and 3:1 Aflutter, rate 103bpm.  Recent Labs: 08/13/2022: ALT 15; BUN 20; Creatinine, Ser 1.20; Hemoglobin 11.8; Platelets  291; Potassium 4.3; Sodium 140; TSH 3.651  08/16/2021: Chol/HDL Ratio 3.0; Cholesterol, Total 116; HDL 39; LDL Chol Calc (NIH) 61; Triglycerides 82   Estimated Creatinine Clearance: 51 mL/min (A) (by C-G formula based on SCr of 1.2 mg/dL (H)).   Wt Readings from Last 3 Encounters:  08/13/22 222 lb (100.7 kg)  06/05/22 225 lb 3.2 oz (102.2 kg)  04/22/22 217 lb (98.4 kg)     Additional studies reviewed include: Previous EP, cardiology notes.   TTE 07/24/2022  1. Left ventricular ejection fraction, by estimation, is 45%. Left ventricular ejection fraction by PLAX is 43 %. The left ventricle has mildly decreased function. The left ventricle demonstrates global hypokinesis. Left ventricular diastolic parameters are indeterminate.     2. Right ventricular systolic function is low normal. The right ventricular size is normal.   3. Left atrial size was mildly dilated.   4. Right atrial size was mildly dilated.   5. The mitral valve has been repaired/replaced. No evidence of mitral valve regurgitation. There is a bioprosthetic valve present in the mitral position.   6. The aortic valve is tricuspid. Aortic valve regurgitation is not visualized. Aortic valve sclerosis/calcification is present, without any evidence of aortic stenosis.   7. The inferior vena cava is normal in size with greater than 50% respiratory variability, suggesting right atrial pressure of 3 mmHg.   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   Long term monitor, 12/19/2021 HR 42 - 150 bpm, average 53 bpm. 25 SVT, longest 13 beats. Rare supraventricular and ventricular ectopy. No sustained arrhythmias. No atrial fibrillation.  LHC, 10/09/2021 ([facility] in [place]) L radial access with US guidance  Mild CAD only  Normal LVEDP.   ASSESSMENT AND PLAN:  #) persistent Afib / Aflutter Converted back to Aflutter at some point between 1/31 and today Patient is unsure when she converted as she does not feel any different  currently Discussed with Dr. Lambert - will increase amiodarone to 200mg BID x 14 days, then decrease back to 200mg daily DCCV in at least 2 weeks - update amio labs today Consider aflutter ablation in future Rec'd maze during MVR  CHA2DS2-VASc Score = 7 [CHF History: 1, HTN History: 1, Diabetes History: 0, Stroke History: 2, Vascular Disease History: 1, Age Score: 1, Gender Score: 1].  Therefore, the patient's annual risk of stroke is 11.2 %. OAC - 20mg xarelto daily, appropriately dosed for CrCl >68mL/min   #) HFmrEF Appears euvolemic on exam; warm and dry, no lower extremity edema   #) HTN BP readings have improved Continue losartan at current dose for now given she has only taken it about a week Recommended she keep BP log at home Consider up-titration at future appts   Current medicines are reviewed at length with the patient today.   The patient does not have concerns regarding her medicines.  The following changes were made today:   INCREASE amiodarone to 200mg BID x 14 days, then reduce to 200mg daily  Labs/ tests ordered today include:  Orders Placed This Encounter  Procedures   TSH   T4, free   Comp Met (CMET)   CBC   EKG 12-Lead     Disposition: Follow up with Dr. Lambert in  2-3 months     Signed, Tamu Golz, NP  08/13/22  4:32 PM  Electrophysiology [facility]  

## 2022-08-13 ENCOUNTER — Encounter: Payer: Self-pay | Admitting: Cardiology

## 2022-08-13 ENCOUNTER — Ambulatory Visit: Payer: Medicare HMO | Attending: Cardiology | Admitting: Cardiology

## 2022-08-13 ENCOUNTER — Other Ambulatory Visit
Admission: RE | Admit: 2022-08-13 | Discharge: 2022-08-13 | Disposition: A | Discharge status: Home / Self Care | Payer: Medicare HMO | Attending: Cardiology | Admitting: Cardiology

## 2022-08-13 VITALS — BP 130/80 | HR 103 | Ht 65.5 in | Wt 222.0 lb

## 2022-08-13 DIAGNOSIS — I4819 Other persistent atrial fibrillation: Secondary | ICD-10-CM | POA: Insufficient documentation

## 2022-08-13 DIAGNOSIS — I10 Essential (primary) hypertension: Secondary | ICD-10-CM | POA: Diagnosis not present

## 2022-08-13 DIAGNOSIS — I5020 Unspecified systolic (congestive) heart failure: Secondary | ICD-10-CM | POA: Diagnosis not present

## 2022-08-13 DIAGNOSIS — Z79899 Other long term (current) drug therapy: Secondary | ICD-10-CM

## 2022-08-13 DIAGNOSIS — D6869 Other thrombophilia: Secondary | ICD-10-CM

## 2022-08-13 DIAGNOSIS — I483 Typical atrial flutter: Secondary | ICD-10-CM

## 2022-08-13 LAB — COMPREHENSIVE METABOLIC PANEL
ALT: 15 U/L (ref 0–44)
AST: 24 U/L (ref 15–41)
Albumin: 4 g/dL (ref 3.5–5.0)
Alkaline Phosphatase: 86 U/L (ref 38–126)
Anion gap: 7 (ref 5–15)
BUN: 20 mg/dL (ref 8–23)
CO2: 24 mmol/L (ref 22–32)
Calcium: 8.9 mg/dL (ref 8.9–10.3)
Chloride: 109 mmol/L (ref 98–111)
Creatinine, Ser: 1.2 mg/dL — ABNORMAL HIGH (ref 0.44–1.00)
GFR, Estimated: 48 mL/min — ABNORMAL LOW (ref >60)
Glucose, Bld: 108 mg/dL — ABNORMAL HIGH (ref 70–99)
Potassium: 4.3 mmol/L (ref 3.5–5.1)
Sodium: 140 mmol/L (ref 135–145)
Total Bilirubin: 0.7 mg/dL (ref 0.3–1.2)
Total Protein: 8 g/dL (ref 6.5–8.1)

## 2022-08-13 LAB — CBC
HCT: 39.5 % (ref 36.0–46.0)
Hemoglobin: 11.8 g/dL — ABNORMAL LOW (ref 12.0–15.0)
MCH: 25.7 pg — ABNORMAL LOW (ref 26.0–34.0)
MCHC: 29.9 g/dL — ABNORMAL LOW (ref 30.0–36.0)
MCV: 85.9 fL (ref 80.0–100.0)
Platelets: 291 10*3/uL (ref 150–400)
RBC: 4.6 MIL/uL (ref 3.87–5.11)
RDW: 17.2 % — ABNORMAL HIGH (ref 11.5–15.5)
WBC: 10.2 10*3/uL (ref 4.0–10.5)
nRBC: 0 % (ref 0.0–0.2)

## 2022-08-13 LAB — TSH: TSH: 3.651 u[IU]/mL (ref 0.350–4.500)

## 2022-08-13 LAB — T4, FREE: Free T4: 1.17 ng/dL — ABNORMAL HIGH (ref 0.61–1.12)

## 2022-08-13 MED ORDER — AMIODARONE HCL 200 MG PO TABS: ORAL_TABLET | ORAL | 0 refills | Status: DC

## 2022-08-13 NOTE — Patient Instructions (Addendum)
Medication Instructions:  INCREASE amiodarone to 200 mg by mouth twice a day for 2 weeks then resume taking one tablet by mouth daily after cardioversion.  *If you need a refill on your cardiac medications before your next appointment, please call your pharmacy*   Lab Work: TSH, Free T4, CMP, and CBC - Please go to the [facility]. You will check in at the front desk to the right as you walk into the atrium. Valet Parking is offered if needed. - No appointment needed. You may go any day between 7 am and 6 pm.  If you have labs (blood work) drawn today and your tests are completely normal, you will receive your results only by: MyChart Message (if you have MyChart) OR A paper copy in the mail If you have any lab test that is abnormal or we need to change your treatment, we will call you to review the results.  Testing/Procedures: Your physician has recommended that you have a Cardioversion (DCCV). Electrical Cardioversion uses a jolt of electricity to your heart either through paddles or wired patches attached to your chest. This is a controlled, usually prescheduled, procedure. Defibrillation is done under light anesthesia in the hospital, and you usually go home the day of the procedure. This is done to get your heart back into a normal rhythm. You are not awake for the procedure. Please see the instruction sheet given to you today.  You are scheduled for a Cardioversion on August 28, 2022 with Dr. Azucena Cecil. Please arrive at the [facility] at 6:30 a.m. on the day of your procedure.  DIET INSTRUCTIONS:  Nothing to eat or drink after midnight except your medications with a sip of water.  Labs: as listed above  Medications:  YOU MAY TAKE ALL of your remaining medications with a small amount of water.  Must have a responsible person to drive you home.  Bring a current list of your medications and current insurance cards.   If you have any questions after you get home,  please call the office at [phone]  Follow-Up: At [facility], you and your health needs are our priority.  As part of our continuing mission to provide you with exceptional heart care, we have created designated Provider Care Teams.  These Care Teams include your primary Cardiologist (physician) and Advanced Practice Providers (APPs -  Physician Assistants and Nurse Practitioners) who all work together to provide you with the care you need, when you need it.  We recommend signing up for the patient portal called "MyChart".  Sign up information is provided on this After Visit Summary.  MyChart is used to connect with patients for Virtual Visits (Telemedicine).  Patients are able to view lab/test results, encounter notes, upcoming appointments, etc.  Non-urgent messages can be sent to your provider as well.   To learn more about what you can do with MyChart, go to [email or web address].    Your next appointment:   2-3 month(s)  Provider:   Steffanie Dunn, MD

## 2022-08-15 ENCOUNTER — Ambulatory Visit (INDEPENDENT_AMBULATORY_CARE_PROVIDER_SITE_OTHER): Payer: Medicare HMO | Admitting: Family Medicine

## 2022-08-15 ENCOUNTER — Encounter: Payer: Self-pay | Admitting: Family Medicine

## 2022-08-15 VITALS — BP 135/70 | HR 104 | Temp 97.8°F | Resp 16 | Wt 222.0 lb

## 2022-08-15 DIAGNOSIS — G8929 Other chronic pain: Secondary | ICD-10-CM | POA: Diagnosis not present

## 2022-08-15 DIAGNOSIS — M25561 Pain in right knee: Secondary | ICD-10-CM

## 2022-08-15 DIAGNOSIS — M1711 Unilateral primary osteoarthritis, right knee: Secondary | ICD-10-CM | POA: Diagnosis not present

## 2022-08-15 MED ORDER — METHOCARBAMOL 500 MG PO TABS: 500.0000 mg | ORAL_TABLET | Freq: Three times a day (TID) | ORAL | 1 refills | Status: DC | PRN

## 2022-08-15 NOTE — Assessment & Plan Note (Signed)
Symptoms and history c/w osteoarthritis of the right knee.  Continue using tylenol and conservative management.  Referral to Orthopedics. Restarted methocarbamol 500 mg every 8 hours as needed.  Stop cyclobenzaprine.

## 2022-08-15 NOTE — Progress Notes (Signed)
   Acute Office Visit  Subjective:     Patient ID: Tina Peters, female    DOB: [birth year], 72 y.o.   MRN: [id number]  Chief Complaint  Patient presents with   Knee Pain    Right knee x 1 year    Back Pain   Knee pain Patient is in today for right knee pain for over a year. She notes that it feels like her knee is going to give out on her while walking. Her daughter has noticed a limp, and she has been using a can to support her right leg. The pain wakes her up at night and is not improved by positional changes. Arthritis tylenol helps improve the pain some. Gabapentin and muscle relaxants have not improved the pain. Previously went to the ED for the pain. MRI showed no abnormalities of previously placed pins. Hospital visit showed arthritis in the right knee. Steroid in the past helped.  Medication question Insurance is not covering her previous muscle relaxant, methocarbamol, and instead she was given cyclobenzaprine. She cannot take this medication due to her cardiac history. Would like to explore other options for muscle relaxants.    ROS as stated in HPI       Objective:    BP 135/70 (BP Location: Left Arm, Patient Position: Sitting, Cuff Size: Large)   Pulse (!) 104   Temp 97.8 F (36.6 C) (Temporal)   Resp 16   Wt 222 lb (100.7 kg)   SpO2 97%   BMI 36.38 kg/m    Physical Exam Constitutional:      General: She is not in acute distress. Cardiovascular:     Pulses: Normal pulses.  Pulmonary:     Effort: Pulmonary effort is normal.  Musculoskeletal:     Right knee: Effusion present. Decreased range of motion. Tenderness present. No LCL laxity, MCL laxity, ACL laxity or PCL laxity. Normal patellar mobility. Normal pulse.     Left knee: Normal.     Right lower leg: No edema.     Left lower leg: No edema.  Skin:    General: Skin is warm and dry.     Findings: No bruising.  Neurological:     Mental Status: She is alert.     Gait: Gait abnormal.     No  results found for any visits on 08/15/22.      Assessment & Plan:  1. Primary osteoarthritis of right knee 2. Chronic pain of right knee Symptoms and history c/w osteoarthritis of the right knee.  Continue using tylenol and conservative management.  Referral to Orthopedics. Restarted methocarbamol 500 mg every 8 hours as needed.  Stop cyclobenzaprine.  - Ambulatory referral to Orthopedics   Meds ordered this encounter  Medications   methocarbamol (ROBAXIN) 500 MG tablet    Sig: Take 1 tablet (500 mg total) by mouth every 8 (eight) hours as needed for muscle spasms.    Dispense:  90 tablet    Refill:  1    Return in about 3 months (around 11/14/2022) for CPE.  Gilmer Mor, Medical Student   Patient seen along with MS3 student Tina Peters. I personally evaluated this patient along with the student, and verified all aspects of the history, physical exam, and medical decision making as documented by the student. I agree with the student's documentation and have made all necessary edits.  Tina Peters, Marzella Schlein, MD, MPH [facility]

## 2022-08-28 ENCOUNTER — Encounter
Admission: RE | Disposition: A | Discharge status: Home / Self Care | Payer: Self-pay | Source: Home / Self Care | Attending: Cardiology

## 2022-08-28 ENCOUNTER — Ambulatory Visit: Payer: Medicare HMO | Admitting: Anesthesiology

## 2022-08-28 ENCOUNTER — Encounter: Payer: Self-pay | Admitting: Cardiology

## 2022-08-28 ENCOUNTER — Ambulatory Visit
Admission: RE | Admit: 2022-08-28 | Discharge: 2022-08-28 | Disposition: A | Discharge status: Home / Self Care | Payer: Medicare HMO | Attending: Cardiology | Admitting: Cardiology

## 2022-08-28 DIAGNOSIS — Z6836 Body mass index (BMI) 36.0-36.9, adult: Secondary | ICD-10-CM | POA: Insufficient documentation

## 2022-08-28 DIAGNOSIS — E785 Hyperlipidemia, unspecified: Secondary | ICD-10-CM | POA: Insufficient documentation

## 2022-08-28 DIAGNOSIS — Z8673 Personal history of transient ischemic attack (TIA), and cerebral infarction without residual deficits: Secondary | ICD-10-CM | POA: Diagnosis not present

## 2022-08-28 DIAGNOSIS — I480 Paroxysmal atrial fibrillation: Secondary | ICD-10-CM | POA: Diagnosis not present

## 2022-08-28 DIAGNOSIS — G4733 Obstructive sleep apnea (adult) (pediatric): Secondary | ICD-10-CM | POA: Insufficient documentation

## 2022-08-28 DIAGNOSIS — I130 Hypertensive heart and chronic kidney disease with heart failure and stage 1 through stage 4 chronic kidney disease, or unspecified chronic kidney disease: Secondary | ICD-10-CM | POA: Diagnosis not present

## 2022-08-28 DIAGNOSIS — E669 Obesity, unspecified: Secondary | ICD-10-CM | POA: Insufficient documentation

## 2022-08-28 DIAGNOSIS — J45909 Unspecified asthma, uncomplicated: Secondary | ICD-10-CM | POA: Insufficient documentation

## 2022-08-28 DIAGNOSIS — I495 Sick sinus syndrome: Secondary | ICD-10-CM | POA: Insufficient documentation

## 2022-08-28 DIAGNOSIS — I4819 Other persistent atrial fibrillation: Secondary | ICD-10-CM | POA: Insufficient documentation

## 2022-08-28 DIAGNOSIS — Z953 Presence of xenogenic heart valve: Secondary | ICD-10-CM | POA: Insufficient documentation

## 2022-08-28 DIAGNOSIS — F32A Depression, unspecified: Secondary | ICD-10-CM | POA: Diagnosis not present

## 2022-08-28 DIAGNOSIS — E1122 Type 2 diabetes mellitus with diabetic chronic kidney disease: Secondary | ICD-10-CM | POA: Insufficient documentation

## 2022-08-28 DIAGNOSIS — I5042 Chronic combined systolic (congestive) and diastolic (congestive) heart failure: Secondary | ICD-10-CM | POA: Diagnosis not present

## 2022-08-28 DIAGNOSIS — I4892 Unspecified atrial flutter: Secondary | ICD-10-CM

## 2022-08-28 DIAGNOSIS — N183 Chronic kidney disease, stage 3 unspecified: Secondary | ICD-10-CM | POA: Insufficient documentation

## 2022-08-28 DIAGNOSIS — F419 Anxiety disorder, unspecified: Secondary | ICD-10-CM | POA: Insufficient documentation

## 2022-08-28 DIAGNOSIS — Z87891 Personal history of nicotine dependence: Secondary | ICD-10-CM | POA: Diagnosis not present

## 2022-08-28 DIAGNOSIS — I4891 Unspecified atrial fibrillation: Secondary | ICD-10-CM

## 2022-08-28 HISTORY — PX: CARDIOVERSION: SHX1299

## 2022-08-28 SURGERY — CARDIOVERSION
Anesthesia: General

## 2022-08-28 MED ORDER — PROPOFOL 10 MG/ML IV BOLUS
INTRAVENOUS | Status: AC
  Filled 2022-08-28: qty 20

## 2022-08-28 MED ORDER — PROPOFOL 1000 MG/100ML IV EMUL
INTRAVENOUS | Status: AC
  Filled 2022-08-28: qty 100

## 2022-08-28 MED ORDER — PROPOFOL 10 MG/ML IV BOLUS
INTRAVENOUS | Status: DC | PRN
  Administered 2022-08-28 (×2): 40 mg via INTRAVENOUS

## 2022-08-28 MED ORDER — SODIUM CHLORIDE 0.9 % IV SOLN: INTRAVENOUS | Status: DC

## 2022-08-28 NOTE — Interval H&P Note (Signed)
History and Physical Interval Note:  08/28/2022 7:42 AM  Tina Peters  has presented today for surgery, with the diagnosis of Cardioversion  atrial flutter.  The various methods of treatment have been discussed with the patient and family. After consideration of risks, benefits and other options for treatment, the patient has consented to  Procedure(s): CARDIOVERSION (N/A) as a surgical intervention.  The patient's history has been reviewed, patient examined, no change in status, stable for surgery.  I have reviewed the patient's chart and labs.  Questions were answered to the patient's satisfaction.     [name]

## 2022-08-28 NOTE — Transfer of Care (Signed)
Immediate Anesthesia Transfer of Care Note  Patient: Tina Peters  Procedure(s) Performed: CARDIOVERSION  Patient Location: Special recovery room 14  Anesthesia Type:General  Level of Consciousness: drowsy  Airway & Oxygen Therapy: Patient Spontanous Breathing and Patient connected to nasal cannula oxygen  Post-op Assessment: Report given to RN and Post -op Vital signs reviewed and stable  Post vital signs: Reviewed and stable  Last Vitals:  Vitals Value Taken Time  BP 119/58 08/28/22 0752  Temp 35.9   Pulse 49 08/28/22 0753  Resp 21 08/28/22 0753  SpO2 95% 08/28/22 0753    Last Pain:  Vitals:   08/28/22 0655  TempSrc: Oral  PainSc: 0-No pain     Reported to Cath lab RN in special recovery room who is resuming care of pt. No complications noted. Pt. Awakening and spontaneously breathing.     Complications: No notable events documented.

## 2022-08-28 NOTE — Procedures (Addendum)
Cardioversion procedure note For atrial flutter  Procedure Details:  Consent: Risks of procedure as well as the alternatives and risks of each were explained to the (patient/caregiver).  Consent for procedure obtained.  Time Out: Verified patient identification, verified procedure, site/side was marked, verified correct patient position, special equipment/implants available, medications/allergies/relevent history reviewed, required imaging and test results available.  Performed  Patient placed on cardiac monitor, pulse oximetry, supplemental oxygen as necessary.   Sedation given: propofol IV, per anesthesia team Pacer pads placed anterior and posterior chest.   Cardioverted 1 time(s).   Cardioverted at  200J. Synchronized biphasic Converted to sinus bradycardia,    Evaluation: Findings: Post procedure EKG shows: sinus bradycardia, HR 48-52 bpm. Complications: None Patient did tolerate procedure well.  Recommendations -cont amio, xarelto -keep appt with afib clinic  Time Spent Directly with the Patient:  25 minutes   Debbe Odea, M.D.

## 2022-08-28 NOTE — Anesthesia Preprocedure Evaluation (Addendum)
Anesthesia Evaluation  Patient identified by MRN, date of birth, ID band Patient awake    Reviewed: Allergy & Precautions, NPO status , Patient's Chart, lab work & pertinent test results  History of Anesthesia Complications Negative for: history of anesthetic complications  Airway Mallampati: III  TM Distance: >3 FB Neck ROM: full    Dental no notable dental hx.    Pulmonary shortness of breath, asthma , sleep apnea and Continuous Positive Airway Pressure Ventilation , former smoker   Pulmonary exam normal        Cardiovascular hypertension, + Peripheral Vascular Disease and +CHF  + dysrhythmias Atrial Fibrillation + Valvular Problems/Murmurs (s/p MV replacement)   ECHO (07/2022) IMPRESSIONS     1. Left ventricular ejection fraction, by estimation, is 45%. Left  ventricular ejection fraction by PLAX is 43 %. The left ventricle has  mildly decreased function. The left ventricle demonstrates global  hypokinesis. Left ventricular diastolic parameters   are indeterminate.   2. Right ventricular systolic function is low normal. The right  ventricular size is normal.   3. Left atrial size was mildly dilated.   4. Right atrial size was mildly dilated.   5. The mitral valve has been repaired/replaced. No evidence of mitral  valve regurgitation. There is a bioprosthetic valve present in the mitral  position.   6. The aortic valve is tricuspid. Aortic valve regurgitation is not  visualized. Aortic valve sclerosis/calcification is present, without any  evidence of aortic stenosis.   7. The inferior vena cava is normal in size with greater than 50%  respiratory variability, suggesting right atrial pressure of 3 mmHg.      Neuro/Psych  PSYCHIATRIC DISORDERS Anxiety Depression     Neuromuscular disease CVA    GI/Hepatic negative GI ROS, Neg liver ROS,,,  Endo/Other  negative endocrine ROS    Renal/GU Renal disease  negative  genitourinary   Musculoskeletal   Abdominal   Peds  Hematology negative hematology ROS (+)   Anesthesia Other Findings Past Medical History: No date: Allergy No date: Anxiety No date: Arthritis No date: Asthma No date: Chronic back pain No date: Chronic combined systolic (congestive) and diastolic  (congestive) heart failure     Comment:  a. 02/2020 Echo: EF 60-65%, no rwma. Gr2 DD. Nl RV               size/fxn. Mild LAE. Mild MR; b. 10/2021 Echo: [facility]               following MVR) EF 40 to 45%. No date: Depression No date: Diverticulitis No date: Endocarditis     Comment:  a. 10/2021 S sanguinous bacteremia w/ MV veg-->45mm               Mitris bioprosthetic valve, Maze, and LAA clip. No date: Hyperlipidemia No date: Hypertension No date: Lipoma No date: Mild mitral regurgitation     Comment:  a. 08/2016 Echo: mild MR; b. 02/2020 Echo: Mild MR. No date: Neuromuscular disorder No date: Obesity No date: Obstructive sleep apnea No date: PAF (paroxysmal atrial fibrillation)     Comment:  a. Dx 05/2016--> Xarelto (CHA2DS2VASc = 3); b. 08/2016               Echo: EF 55-60%, no rwma, mild MR, mod dil LA; c. 03/2020              Zio: Avg HR 61 w/ Afib burden of 24% - max rate 189 (avg  93). 2 pauses - longest 4.7 secs post-conversion pauses               (occurred @ 5:42 am). No date: Paroxysmal SVT (supraventricular tachycardia) No date: S/P mitral valve replacement     Comment:  a. 10/2021 [facility] - Endocarditis/MV Veg-->72mm Mitris               bioprosthetic valve, Maze, and LAA clip. No date: Sleep apnea No date: Stroke     Comment:  a. 02/2020 MRI/A: multifocal acute ischemia in R MCA               territory predominantly involving post insula and R               middle frontal gyrus. Nl MRA. No date: Tachy-brady syndrome No date: Tobacco abuse  Past Surgical History: No date: ACHILLES TENDON SURGERY; Left No date: BACK SURGERY     Comment:  2 ruptured discs  in L spine, no hardware 04/22/2022: CARDIOVERSION; N/A     Comment:  Procedure: CARDIOVERSION;  Surgeon: Iran Ouch,               MD;  Location: [facility] ORS;  Service: Cardiovascular;                Laterality: N/A; No date: KNEE ARTHROSCOPY W/ MENISCAL REPAIR; Right     Comment:  in the 1990s No date: SALPINGOOPHORECTOMY; Bilateral No date: TUMOR REMOVAL     Comment:  neck, ?lipoma 10/04/2021: VALVE REPLACEMENT  BMI    Body Mass Index: 36.94 kg/m      Reproductive/Obstetrics negative OB ROS                             Anesthesia Physical Anesthesia Plan  ASA: 3  Anesthesia Plan: General   Post-op Pain Management: Minimal or no pain anticipated   Induction: Intravenous  PONV Risk Score and Plan: 2 and Propofol infusion and TIVA  Airway Management Planned: Natural Airway and Nasal Cannula  Additional Equipment:   Intra-op Plan:   Post-operative Plan:   Informed Consent: I have reviewed the patients History and Physical, chart, labs and discussed the procedure including the risks, benefits and alternatives for the proposed anesthesia with the patient or authorized representative who has indicated his/her understanding and acceptance.     Dental Advisory Given  Plan Discussed with: Anesthesiologist, CRNA and Surgeon  Anesthesia Plan Comments: (Patient consented for risks of anesthesia including but not limited to:  - adverse reactions to medications - risk of airway placement if required - damage to eyes, teeth, lips or other oral mucosa - nerve damage due to positioning  - sore throat or hoarseness - Damage to heart, brain, nerves, lungs, other parts of body or loss of life  Patient voiced understanding.)       Anesthesia Quick Evaluation

## 2022-08-29 ENCOUNTER — Encounter: Payer: Self-pay | Admitting: Cardiology

## 2022-08-29 NOTE — Anesthesia Postprocedure Evaluation (Signed)
Anesthesia Post Note  Patient: Tina Peters  Procedure(s) Performed: CARDIOVERSION  Anesthesia Type: General Anesthetic complications: no   No notable events documented.   Last Vitals:  Vitals:   08/28/22 0830 08/28/22 0845  BP: (!) 150/73 (!) 149/79  Pulse:    Resp: 18 (!) 23  Temp:    SpO2: 100% 100%    Last Pain:  Vitals:   08/28/22 0845  TempSrc:   PainSc: 0-No pain                 Louie Boston

## 2022-09-04 ENCOUNTER — Ambulatory Visit: Payer: Medicare HMO | Attending: Nurse Practitioner | Admitting: Nurse Practitioner

## 2022-09-04 ENCOUNTER — Encounter: Payer: Self-pay | Admitting: Nurse Practitioner

## 2022-09-04 VITALS — BP 126/78 | HR 52 | Ht 65.0 in | Wt 227.4 lb

## 2022-09-04 DIAGNOSIS — N1831 Chronic kidney disease, stage 3a: Secondary | ICD-10-CM | POA: Diagnosis not present

## 2022-09-04 DIAGNOSIS — Z953 Presence of xenogenic heart valve: Secondary | ICD-10-CM | POA: Diagnosis not present

## 2022-09-04 DIAGNOSIS — I10 Essential (primary) hypertension: Secondary | ICD-10-CM

## 2022-09-04 DIAGNOSIS — I4819 Other persistent atrial fibrillation: Secondary | ICD-10-CM | POA: Diagnosis not present

## 2022-09-04 DIAGNOSIS — I5020 Unspecified systolic (congestive) heart failure: Secondary | ICD-10-CM | POA: Diagnosis not present

## 2022-09-04 DIAGNOSIS — M1711 Unilateral primary osteoarthritis, right knee: Secondary | ICD-10-CM | POA: Diagnosis not present

## 2022-09-04 DIAGNOSIS — I38 Endocarditis, valve unspecified: Secondary | ICD-10-CM | POA: Diagnosis not present

## 2022-09-04 DIAGNOSIS — M170 Bilateral primary osteoarthritis of knee: Secondary | ICD-10-CM | POA: Diagnosis not present

## 2022-09-04 DIAGNOSIS — Z952 Presence of prosthetic heart valve: Secondary | ICD-10-CM

## 2022-09-04 NOTE — Patient Instructions (Signed)
Medication Instructions:  No changes *If you need a refill on your cardiac medications before your next appointment, please call your pharmacy*   Lab Work: None ordered If you have labs (blood work) drawn today and your tests are completely normal, you will receive your results only by: MyChart Message (if you have MyChart) OR A paper copy in the mail If you have any lab test that is abnormal or we need to change your treatment, we will call you to review the results.   Testing/Procedures: None ordered   Follow-Up: At [facility], you and your health needs are our priority.  As part of our continuing mission to provide you with exceptional heart care, we have created designated Provider Care Teams.  These Care Teams include your primary Cardiologist (physician) and Advanced Practice Providers (APPs -  Physician Assistants and Nurse Practitioners) who all work together to provide you with the care you need, when you need it.  We recommend signing up for the patient portal called "MyChart".  Sign up information is provided on this After Visit Summary.  MyChart is used to connect with patients for Virtual Visits (Telemedicine).  Patients are able to view lab/test results, encounter notes, upcoming appointments, etc.  Non-urgent messages can be sent to your provider as well.   To learn more about what you can do with MyChart, go to [email or web address].    Your next appointment:   3 month(s)  Provider:   You may see Lorine Bears, MD or one of the following Advanced Practice Providers on your designated Care Team:   Nicolasa Ducking, NP  Other Instructions A referral has been placed for Cardiac Rehab. They will call you to set this up.

## 2022-09-04 NOTE — Progress Notes (Signed)
Office Visit    Patient Name: MC HOLLEN Date of Encounter: 09/04/2022  Primary Care Provider:  Erasmo Downer, MD Primary Cardiologist:  Lorine Bears, MD  Chief Complaint    72 y/o ? w/ a h/o PAF, tachybrady syndrome, PSVT, HTN, HL, stroke, OSA on CPAP, CKD III, endocarditis s/p MVR, asthma, tob abuse, back pain, and diverticulitis, who presents for f/u related to atrial fibrillation.  Past Medical History    Past Medical History:  Diagnosis Date   Allergy    Anxiety    Arthritis    Asthma    Chronic back pain    Chronic combined systolic (congestive) and diastolic (congestive) heart failure (HCC)    a. 02/2020 Echo: EF 60-65%, no rwma. Gr2 DD. Nl RV size/fxn. Mild LAE. Mild MR; b. 10/2021 Echo: [facility] following MVR) EF 40 to 45%.   Depression    Diverticulitis    Endocarditis    a. 10/2021 S sanguinous bacteremia w/ MV veg-->78mm Mitris bioprosthetic valve, Maze, and LAA clip.   Hyperlipidemia    Hypertension    Lipoma    Mild mitral regurgitation    a. 08/2016 Echo: mild MR; b. 02/2020 Echo: Mild MR.   Neuromuscular disorder (HCC)    Obesity    Obstructive sleep apnea    PAF (paroxysmal atrial fibrillation) (HCC)    a. Dx 05/2016--> Xarelto (CHA2DS2VASc = 3); b. 08/2016 Echo: EF 55-60%, no rwma, mild MR, mod dil LA; c. 03/2020 Zio: Avg HR 61 w/ Afib burden of 24% - max rate 189 (avg 93). 2 pauses - longest 4.7 secs post-conversion pauses (occurred @ 5:42 am); d. 04/2022 s/p DCCV; e. 08/2022 s/p DCCV.   Paroxysmal SVT (supraventricular tachycardia)    S/P mitral valve replacement    a. 10/2021 [facility] - Endocarditis/MV Veg-->56mm Mitris bioprosthetic valve, Maze, and LAA clip.   Sleep apnea    Stroke [facility])    a. 02/2020 MRI/A: multifocal acute ischemia in R MCA territory predominantly involving post insula and R middle frontal gyrus. Nl MRA.   Tachy-brady syndrome (HCC)    Tobacco abuse    Past Surgical History:  Procedure Laterality Date   ACHILLES TENDON  SURGERY Left    BACK SURGERY     2 ruptured discs in L spine, no hardware   CARDIOVERSION N/A 04/22/2022   Procedure: CARDIOVERSION;  Surgeon: Iran Ouch, MD;  Location: [facility] ORS;  Service: Cardiovascular;  Laterality: N/A;   CARDIOVERSION N/A 08/28/2022   Procedure: CARDIOVERSION;  Surgeon: Debbe Odea, MD;  Location: [facility] ORS;  Service: Cardiovascular;  Laterality: N/A;   KNEE ARTHROSCOPY W/ MENISCAL REPAIR Right    in the 1990s   SALPINGOOPHORECTOMY Bilateral    TUMOR REMOVAL     neck, ?lipoma   VALVE REPLACEMENT  10/04/2021    Allergies  No Known Allergies  History of Present Illness    72 year old female with a history of paroxysmal atrial fibrillation/flutter, tachybradycardia syndrome, PSVT, hypertension, hyperlipidemia, stroke, obstructive sleep apnea on CPAP, stage III chronic kidney disease, endocarditis status post MVR, asthma, tobacco abuse, back pain, and diverticulitis.  She has a history of PSVT dating back to 2016, which was thought to be secondary to hypokalemia in the setting of HCTZ usage.  This was managed with beta-blocker therapy (diltiazem caused leg edema).  In January 2018, she was diagnosed with atrial fibrillation with RVR and converted to sinus rhythm on diltiazem.  She was placed on Xarelto.  In October 2021, she was seen  in cardiology clinic with presyncope and acute confusion and loss of fine motor skills.  She was sent to the emergency department where MRI showed multifocal acute ischemia in the right MCA territory predominantly involving the posterior insula and right middle frontal gyrus.  MRA and carotid Dopplers were unremarkable.  Xarelto was resumed once cleared by neurology.  She then underwent ZIO monitoring which showed predominantly sinus rhythm with a 24% atrial fibrillation burden and she was noted to have a 4.7 postconversion pause.  She was subsequently seen by electrophysiology and placed on Multaq to avoid recurrent A-fib and thus  postconversion pauses.  In June 2023, she was admitted to [facility] with Streptococcus sanguinous bacteremia from infected tooth and was found to have a mitral valve vegetation.  Diagnostic catheterization showed mild, nonobstructive CAD.  She had a prolonged hospitalization underwent mitral valve replacement with 27 mm Mitris bioprosthetic valve, Maze procedure, and left atrial appendage clip.  Postoperatively, she had acute left lower extremity ischemia which required left common femoral artery exposure, thrombectomy of the left iliac, common femoral, superficial femoral, and profunda arteries as well as 4 compartment fasciotomy.  Postop echo showed EF of 40 to 45%.  She also had postoperative atrial fibrillation which required cardioversion and she was switched from Multaq to amiodarone.  In November 2023, she was seen in follow-up and found to be in atrial tachycardia versus atypical atrial flutter with rapid response.  Amiodarone was increased to 2 mg twice daily.  She remained tachycardic at office follow-up on April 18, 2022 and subsequently underwent cardioversion.  Ms. Wellman was seen in EP clinic in April with note of reduced stamina.  She was found to be in atrial flutter.  Amiodarone was increased back to 200 mg twice daily x 14 days and she underwent successful cardioversion on April 24.  Since then, she has noted some improvement in stamina but not as much as she would like.  She says that ever since her heart surgery last year, she has been easily fatigued and becomes very hot with activity, regardless of what rhythm she might be in.  She does not experience chest pain or significant dyspnea.  She denies palpitations, PND, orthopnea, dizziness, syncope, edema, or early satiety.  We discussed the potential role for cardiac rehabilitation, she was never able to complete this after her mitral valve surgery, and she is interested in pursuing.  Home Medications    Current Outpatient Medications   Medication Sig Dispense Refill   acetaminophen (TYLENOL) 325 MG tablet Take by mouth. 4 tablets as needed     albuterol (VENTOLIN HFA) 108 (90 Base) MCG/ACT inhaler Inhale 2 puffs into the lungs every 6 (six) hours as needed for wheezing or shortness of breath. 18 g 3   amiodarone (PACERONE) 200 MG tablet Take 1 tablet by mouth twice a day for two weeks then resume taking 1 tablet by mouth daily 44 tablet 0   aspirin 81 MG chewable tablet Chew 81 mg by mouth daily.     atorvastatin (LIPITOR) 40 MG tablet Take 1 tablet (40 mg total) by mouth daily. 90 tablet 4   hydrocortisone 2.5 % cream Apply 1 application topically as needed.     losartan (COZAAR) 25 MG tablet Take 1 tablet (25 mg total) by mouth daily. 90 tablet 3   methocarbamol (ROBAXIN) 500 MG tablet Take 1 tablet (500 mg total) by mouth every 8 (eight) hours as needed for muscle spasms. 90 tablet 1   metoprolol succinate (TOPROL-XL)  50 MG 24 hr tablet Take 1 tablet (50 mg total) by mouth daily. 30 tablet 11   rivaroxaban (XARELTO) 20 MG TABS tablet TAKE 1 TABLET (20 MG TOTAL) BY MOUTH DAILY WITH SUPPER. TAKE WITH DINNER OR LARGEST MEAL OF THE DAY 90 tablet 0   sertraline (ZOLOFT) 50 MG tablet TAKE 1 TABLET BY MOUTH EVERY DAY 90 tablet 1   No current facility-administered medications for this visit.     Review of Systems    Some improvement in stamina since cardioversion.  She denies chest pain, dyspnea, palpitations, PND, orthopnea, dizziness, syncope, edema, or early satiety.  All other systems reviewed and are otherwise negative except as noted above.   Cardiac Rehabilitation Eligibility Assessment      Physical Exam    VS:  BP 126/78 (BP Location: Left Arm, Patient Position: Sitting, Cuff Size: Large)   Pulse (!) 52   Ht 5\' 5"  (1.651 m)   Wt 227 lb 6.4 oz (103.1 kg)   SpO2 96%   BMI 37.84 kg/m  , BMI Body mass index is 37.84 kg/m.     GEN: Well nourished, well developed, in no acute distress. HEENT: normal. Neck:  Supple, no JVD, carotid bruits, or masses. Cardiac: RRR, no murmurs, rubs, or gallops. No clubbing, cyanosis, edema.  Radials 2+/PT 2+ and equal bilaterally.  Respiratory:  Respirations regular and unlabored, clear to auscultation bilaterally. GI: Soft, nontender, nondistended, BS + x 4. MS: no deformity or atrophy. Skin: warm and dry, no rash. Neuro:  Strength and sensation are intact. Psych: Normal affect.  Accessory Clinical Findings    ECG personally reviewed by me today -sinus bradycardia, 52, inferior infarct, delayed R wave progression- no acute changes.  Lab Results  Component Value Date   WBC 10.2 08/13/2022   HGB 11.8 (L) 08/13/2022   HCT 39.5 08/13/2022   MCV 85.9 08/13/2022   PLT 291 08/13/2022   Lab Results  Component Value Date   CREATININE 1.20 (H) 08/13/2022   BUN 20 08/13/2022   NA 140 08/13/2022   K 4.3 08/13/2022   CL 109 08/13/2022   CO2 24 08/13/2022   Lab Results  Component Value Date   ALT 15 08/13/2022   AST 24 08/13/2022   ALKPHOS 86 08/13/2022   BILITOT 0.7 08/13/2022   Lab Results  Component Value Date   CHOL 116 08/16/2021   HDL 39 (L) 08/16/2021   LDLCALC 61 08/16/2021   TRIG 82 08/16/2021   CHOLHDL 3.0 08/16/2021    Lab Results  Component Value Date   HGBA1C 5.9 (H) 08/16/2021    Assessment & Plan    1.  Persistent atrial fibrillation/flutter: Status post cardioversion in December 2023 with repeat cardioversion April 24.  She is maintaining sinus rhythm on amiodarone 200 mg daily and Toprol-XL 50 mg daily.  She is anticoagulated with rivaroxaban 20 mg daily with creatinine clearance of 69.99 using her current weight.  2.  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  More recent follow-up echo shows persistent midrange EF of 45% with global hypokinesis and normal functioning bioprosthesis.  Continue beta-blocker and  ARB.  Will need SBE prophylaxis going forward.  3.  Nonischemic cardiomyopathy: As above, EF 45% in March of this year.  She is euvolemic on examination.  She remains on beta-blocker and ARB therapy.  We did have a discussion about appropriate GDMT in patients with reduced  ejection fraction, potentially to include switching to Entresto and adding SGLT2 inhibitor and MRA as tolerated.  At this time, she would like to continue her current medical regimen and pursue cardiac rehabilitation, as this was never able to perform following her surgery.  We agreed to continue to discuss maximizing her medical therapy going forward.  4.  Essential hypertension: Blood pressure stable today on beta-blocker and ARB.  5.  Stage III chronic kidney disease: Creatinine 1.20 on April 9.  She remains on ARB therapy.  6.  Prolonged QTc: Stable today at 448 ms on amiodarone 200 mg daily.  7.  Obstructive sleep apnea: Compliant with CPAP.  8.  Disposition: Referral for cardiac rehab to see if we can further improve her stamina.  Follow-up in clinic in 3 months or sooner if necessary.  Continue to follow-up with electrophysiology for A-fib management.   Nicolasa Ducking, NP 09/04/2022, 5:43 PM

## 2022-09-05 ENCOUNTER — Other Ambulatory Visit: Payer: Self-pay | Admitting: Cardiology

## 2022-09-05 ENCOUNTER — Other Ambulatory Visit: Payer: Self-pay | Admitting: *Deleted

## 2022-09-05 DIAGNOSIS — Z9889 Other specified postprocedural states: Secondary | ICD-10-CM

## 2022-09-05 DIAGNOSIS — I4819 Other persistent atrial fibrillation: Secondary | ICD-10-CM

## 2022-09-08 DIAGNOSIS — I10 Essential (primary) hypertension: Secondary | ICD-10-CM | POA: Diagnosis not present

## 2022-09-08 DIAGNOSIS — G4733 Obstructive sleep apnea (adult) (pediatric): Secondary | ICD-10-CM | POA: Diagnosis not present

## 2022-09-25 ENCOUNTER — Other Ambulatory Visit: Payer: Self-pay | Admitting: Family Medicine

## 2022-09-25 DIAGNOSIS — F411 Generalized anxiety disorder: Secondary | ICD-10-CM

## 2022-09-25 DIAGNOSIS — I480 Paroxysmal atrial fibrillation: Secondary | ICD-10-CM

## 2022-09-25 NOTE — Telephone Encounter (Signed)
Requested Prescriptions  Pending Prescriptions Disp Refills   sertraline (ZOLOFT) 50 MG tablet [Pharmacy Med Name: SERTRALINE HCL 50 MG TABLET] 90 tablet 0    Sig: TAKE 1 TABLET BY MOUTH EVERY DAY     Psychiatry:  Antidepressants - SSRI - sertraline Passed - 09/25/2022  2:56 PM      Passed - AST in normal range and within 360 days    AST  Date Value Ref Range Status  08/13/2022 24 15 - 41 U/L Final         Passed - ALT in normal range and within 360 days    ALT  Date Value Ref Range Status  08/13/2022 15 0 - 44 U/L Final         Passed - Completed PHQ-2 or PHQ-9 in the last 360 days      Passed - Valid encounter within last 6 months    Recent Outpatient Visits           1 month ago Primary osteoarthritis of right knee   [facility] [place], Marzella Schlein, MD   5 months ago Primary hypertension   [place] [facility] Alfredia Ferguson, PA-C   8 months ago AF (paroxysmal atrial fibrillation) [facility])   [place] [facility] Alfredia Ferguson, PA-C   10 months ago HFrEF (heart failure with reduced ejection fraction) [facility])   [place] [facility] Alfredia Ferguson, PA-C   10 months ago HFrEF (heart failure with reduced ejection fraction) [facility])   [place] [facility] Alfredia Ferguson, PA-C       Future Appointments             In 4 weeks Lanier Prude, MD [place] [facility] [place]   In 1 month Bacigalupo, Marzella Schlein, MD [facility]   In 2 months Kirke Corin, Chelsea Aus, MD [place] [facility] 20 MG TABS tablet [Pharmacy Med Name: XARELTO 20 MG TABLET] 90 tablet 0    Sig: TAKE 1 TABLET (20 MG TOTAL) BY MOUTH DAILY WITH SUPPER. TAKE WITH DINNER OR LARGEST MEAL OF THE DAY     Hematology: Anticoagulants - rivaroxaban Failed - 09/25/2022  2:56 PM      Failed - Cr in normal range and within 360 days    Creatinine,  Ser  Date Value Ref Range Status  08/13/2022 1.20 (H) 0.44 - 1.00 mg/dL Final         Failed - HGB in normal range and within 360 days    Hemoglobin  Date Value Ref Range Status  08/13/2022 11.8 (L) 12.0 - 15.0 g/dL Final  16/02/9603 54.0 11.1 - 15.9 g/dL Final         Passed - ALT in normal range and within 360 days    ALT  Date Value Ref Range Status  08/13/2022 15 0 - 44 U/L Final         Passed - AST in normal range and within 360 days    AST  Date Value Ref Range Status  08/13/2022 24 15 - 41 U/L Final         Passed - HCT in normal range and within 360 days    HCT  Date Value Ref Range Status  08/13/2022 39.5 36.0 - 46.0 % Final   Hematocrit  Date Value Ref Range Status  11/20/2021 37.5 34.0 -  46.6 % Final         Passed - PLT in normal range and within 360 days    Platelets  Date Value Ref Range Status  08/13/2022 291 150 - 400 K/uL Final  11/20/2021 276 150 - 450 x10E3/uL Final         Passed - eGFR is 15 or above and within 360 days    GFR calc Af Amer  Date Value Ref Range Status  07/19/2018 >60 >60 mL/min Final   GFR, Estimated  Date Value Ref Range Status  08/13/2022 48 (L) >60 mL/min Final    Comment:    (NOTE) Calculated using the CKD-EPI Creatinine Equation (2021)    eGFR  Date Value Ref Range Status  11/20/2021 49 (L) >59 mL/min/1.73 Final         Passed - Patient is not pregnant      Passed - Valid encounter within last 12 months    Recent Outpatient Visits           1 month ago Primary osteoarthritis of right knee   [place] [facility] [place], Marzella Schlein, MD   5 months ago Primary hypertension   [place] [facility] Alfredia Ferguson, PA-C   8 months ago AF (paroxysmal atrial fibrillation) [facility])   [place] [facility] Alfredia Ferguson, PA-C   10 months ago HFrEF (heart failure with reduced ejection fraction) [facility])   [place] [facility] Alfredia Ferguson,  PA-C   10 months ago HFrEF (heart failure with reduced ejection fraction) [facility])   [place] [facility] Alfredia Ferguson, PA-C       Future Appointments             In 4 weeks Lanier Prude, MD [place] [facility] at [place]   In 1 month Bacigalupo, Marzella Schlein, MD [facility]   In 2 months Kirke Corin, Chelsea Aus, MD [facility] at [facility]

## 2022-10-03 ENCOUNTER — Other Ambulatory Visit: Payer: Self-pay

## 2022-10-03 ENCOUNTER — Other Ambulatory Visit: Payer: Self-pay | Admitting: Cardiology

## 2022-10-03 DIAGNOSIS — I4819 Other persistent atrial fibrillation: Secondary | ICD-10-CM

## 2022-10-03 MED ORDER — AMIODARONE HCL 200 MG PO TABS
200.0000 mg | ORAL_TABLET | Freq: Every day | ORAL | 0 refills | Status: DC
Start: 2022-10-03 — End: 2022-12-04

## 2022-10-09 DIAGNOSIS — I10 Essential (primary) hypertension: Secondary | ICD-10-CM | POA: Diagnosis not present

## 2022-10-09 DIAGNOSIS — G4733 Obstructive sleep apnea (adult) (pediatric): Secondary | ICD-10-CM | POA: Diagnosis not present

## 2022-10-23 ENCOUNTER — Ambulatory Visit: Payer: Medicare HMO | Attending: Cardiology | Admitting: Cardiology

## 2022-10-23 NOTE — Progress Notes (Deleted)
  Electrophysiology Office Follow up Visit Note:    Date:  10/23/2022   ID:  Tina Peters, DOB 08/04/1950, MRN [id number]  PCP:  Erasmo Downer, MD  [facility] Cardiologist:  Lorine Bears, MD  [facility] Electrophysiologist:  Lanier Prude, MD    Interval History:    Tina Peters is a 72 y.o. female who presents for a follow up visit.   Last seen December 19, 2021 for atrial fibrillation.  She has a history of mitral valve replacement on October 11, 2021.  Her postop course was complicated by compartment syndrome requiring fasciotomy.  During the mitral valve operation, a Maze procedure was performed.  She was on amiodarone at her last appointment in addition to Xarelto.  The patient saw Christain [place] Sep 04, 2022.  This appointment was after a cardioversion in April.  She saw Rosalita Chessman in August 13, 2022 and was found to be out of rhythm.     Past medical, surgical, social and family history were reviewed.  ROS:   Please see the history of present illness.    All other systems reviewed and are negative.  EKGs/Labs/Other Studies Reviewed:    The following studies were reviewed today:  July 24, 2022 echo EF 45% RV low normal Mildly dilated left and right atrium Bioprosthetic mitral valve in situ  July 13, 2022 atrial flutter  Sep 04, 2022 EKG shows sinus bradycardia  EKG:  The ekg ordered today demonstrates ***   Physical Exam:    VS:  There were no vitals taken for this visit.    Wt Readings from Last 3 Encounters:  09/04/22 227 lb 6.4 oz (103.1 kg)  08/28/22 222 lb (100.7 kg)  08/15/22 222 lb (100.7 kg)     GEN: *** Well nourished, well developed in no acute distress CARDIAC: ***RRR, no murmurs, rubs, gallops RESPIRATORY:  Clear to auscultation without rales, wheezing or rhonchi       ASSESSMENT:    1. Persistent atrial fibrillation (HCC)   2. Atrial flutter, unspecified type (HCC)   3. HFrEF (heart failure with reduced ejection fraction)  (HCC)   4. History of mitral valve replacement   5. Primary hypertension   6. Encounter for long-term (current) use of high-risk medication    PLAN:    In order of problems listed above:  #Persistent atrial fibrillation and flutter #High risk med monitoring-amiodarone Symptomatic.  Rhythm control indicated.  Maintaining sinus rhythm after her most recent cardioversion in April.  I discussed treatment options with the patient including continuing her antiarrhythmic therapy, pursuing catheter ablation.  #Chronic systolic heart failure #History of mitral valve replacement NYHA class II-III.  Warm and dry on exam.  Last EF 45%. On good medical therapy.  ***Cardiac rehab    Signed, Steffanie Dunn, MD, [facility], [facility] 10/23/2022 6:42 AM    Electrophysiology [facility]

## 2022-10-24 ENCOUNTER — Encounter: Payer: Self-pay | Admitting: Cardiology

## 2022-11-14 ENCOUNTER — Encounter: Payer: Medicare HMO | Admitting: Family Medicine

## 2022-11-28 DIAGNOSIS — G4733 Obstructive sleep apnea (adult) (pediatric): Secondary | ICD-10-CM | POA: Diagnosis not present

## 2022-11-28 DIAGNOSIS — I10 Essential (primary) hypertension: Secondary | ICD-10-CM | POA: Diagnosis not present

## 2022-12-04 ENCOUNTER — Other Ambulatory Visit: Payer: Self-pay | Admitting: Family Medicine

## 2022-12-04 ENCOUNTER — Other Ambulatory Visit: Payer: Self-pay | Admitting: Cardiology

## 2022-12-04 DIAGNOSIS — F411 Generalized anxiety disorder: Secondary | ICD-10-CM

## 2022-12-04 DIAGNOSIS — I480 Paroxysmal atrial fibrillation: Secondary | ICD-10-CM

## 2022-12-04 DIAGNOSIS — E782 Mixed hyperlipidemia: Secondary | ICD-10-CM

## 2022-12-04 DIAGNOSIS — I4819 Other persistent atrial fibrillation: Secondary | ICD-10-CM

## 2022-12-05 ENCOUNTER — Ambulatory Visit: Payer: Medicare HMO | Attending: Cardiovascular Disease | Admitting: Cardiovascular Disease

## 2022-12-05 NOTE — Telephone Encounter (Signed)
Requested medication (s) are due for refill today: yes}  Requested medication (s) are on the active medication list: yes  Last refill:  77/23 #90 4 RF  Future visit scheduled: no Notes to clinic:  lab work overdue    Requested Prescriptions  Pending Prescriptions Disp Refills   atorvastatin (LIPITOR) 40 MG tablet [Pharmacy Med Name: ATORVASTATIN 40 MG TABLET] 90 tablet 4    Sig: TAKE 1 TABLET BY MOUTH EVERY DAY     Cardiovascular:  Antilipid - Statins Failed - 12/04/2022  4:56 PM      Failed - Lipid Panel in normal range within the last 12 months    Cholesterol, Total  Date Value Ref Range Status  08/16/2021 116 100 - 199 mg/dL Final   LDL Chol Calc (NIH)  Date Value Ref Range Status  08/16/2021 61 0 - 99 mg/dL Final   HDL  Date Value Ref Range Status  08/16/2021 39 (L) >39 mg/dL Final   Triglycerides  Date Value Ref Range Status  08/16/2021 82 0 - 149 mg/dL Final         Passed - Patient is not pregnant      Passed - Valid encounter within last 12 months    Recent Outpatient Visits           3 months ago Primary osteoarthritis of right knee   [place] [facility] [place], Marzella Schlein, MD   8 months ago Primary hypertension   [place] [facility] Alfredia Ferguson, PA-C   11 months ago AF (paroxysmal atrial fibrillation) [facility])   [place] [facility] Alfredia Ferguson, PA-C   1 year ago HFrEF (heart failure with reduced ejection fraction) [facility])   [place] [facility] Alfredia Ferguson, PA-C   1 year ago HFrEF (heart failure with reduced ejection fraction) [facility])   [place] [facility] Alfredia Ferguson, [place]       Future Appointments             Today Kirke Corin, Chelsea Aus, MD [place] [facility]

## 2022-12-05 NOTE — Progress Notes (Deleted)
Cardiology Office Note   Date:  12/05/2022   ID:  Tina Peters, DOB Jan 13, 1951, MRN [id number]  PCP:  Erasmo Downer, MD  Cardiologist:   Lorine Bears, MD   No chief complaint on file.     History of Present Illness: Tina Peters is a 72 y.o. female who presents for a follow-up visit regarding persistent atrial fibrillation and mitral valve replacement..  She has known history of hypertension, hyperlipidemia sleep apnea on CPAP and tobacco use. She had an episode of supraventricular tachycardia in 2016 in the setting of hypokalemia thought to be due to treatment with hydrochlorothiazide. She was placed on small dose extended release diltiazem with improvement in symptoms . It was switched to metoprolol  due to leg edema.  She had atrial fibrillation with rapid ventricular response in 05/2016.  She converted to sinus rhythm with diltiazem.  She was hospitalized in October 2021 with embolic stroke with multifocal ischemia in the right MCA distribution.  Carotid Dopplers were unremarkable.  She was seen by EP and placed on Multaq to avoid recurrent atrial fibrillation and postconversion pauses.  She was hospitalized at [facility] in June 2023 with Streptococcus sanguinous bacteremia from an infected tooth and was found to have mitral valve vegetation.  Cardiac catheterization before surgery showed mild nonobstructive coronary artery disease.  She had a prolonged hospitalization and underwent mitral valve replacement with 27 mm Mitris bioprosthetic valve, maze procedure and left atrial appendage clip.  Postoperatively, she had acute left lower extremity ischemia which required left common femoral artery exposure, thrombectomy of the left iliac, common femoral, superficial femoral and profunda arteries as well as 4 compartment fasciotomy.  Postoperative echo showed an EF of 40 to 45%.  She also had postoperative atrial fibrillation with subsequent cardioversion after switching Multaq to  amiodarone.  She was discharged to a skilled nursing facility. She was seen in November 2023 and was noted to be in atrial tachycardia versus atypical atrial flutter.  She underwent successful cardioversion in December.  She had recurrent atrial flutter in April 2024 with repeat cardioversion.   Past Medical History:  Diagnosis Date   Allergy    Anxiety    Arthritis    Asthma    Chronic back pain    Chronic combined systolic (congestive) and diastolic (congestive) heart failure (HCC)    a. 02/2020 Echo: EF 60-65%, no rwma. Gr2 DD. Nl RV size/fxn. Mild LAE. Mild MR; b. 10/2021 Echo: [facility] following MVR) EF 40 to 45%.   Depression    Diverticulitis    Endocarditis    a. 10/2021 S sanguinous bacteremia w/ MV veg-->54mm Mitris bioprosthetic valve, Maze, and LAA clip.   Hyperlipidemia    Hypertension    Lipoma    Mild mitral regurgitation    a. 08/2016 Echo: mild MR; b. 02/2020 Echo: Mild MR.   Neuromuscular disorder (HCC)    Obesity    Obstructive sleep apnea    PAF (paroxysmal atrial fibrillation) (HCC)    a. Dx 05/2016--> Xarelto (CHA2DS2VASc = 3); b. 08/2016 Echo: EF 55-60%, no rwma, mild MR, mod dil LA; c. 03/2020 [facility]: Avg HR 61 w/ Afib burden of 24% - max rate 189 (avg 93). 2 pauses - longest 4.7 secs post-conversion pauses (occurred @ 5:42 am); d. 04/2022 s/p DCCV; e. 08/2022 s/p DCCV.   Paroxysmal SVT (supraventricular tachycardia)    S/P mitral valve replacement    a. 10/2021 [facility] - Endocarditis/MV Veg-->46mm Mitris bioprosthetic valve, Maze, and LAA clip.  Sleep apnea    Stroke [facility])    a. 02/2020 MRI/A: multifocal acute ischemia in R MCA territory predominantly involving post insula and R middle frontal gyrus. Nl MRA.   Tachy-brady syndrome (HCC)    Tobacco abuse     Past Surgical History:  Procedure Laterality Date   ACHILLES TENDON SURGERY Left    BACK SURGERY     2 ruptured discs in L spine, no hardware   CARDIOVERSION N/A 04/22/2022   Procedure: CARDIOVERSION;  Surgeon:  Iran Ouch, MD;  Location: [facility] ORS;  Service: Cardiovascular;  Laterality: N/A;   CARDIOVERSION N/A 08/28/2022   Procedure: CARDIOVERSION;  Surgeon: Debbe Odea, MD;  Location: [facility] ORS;  Service: Cardiovascular;  Laterality: N/A;   KNEE ARTHROSCOPY W/ MENISCAL REPAIR Right    in the 1990s   SALPINGOOPHORECTOMY Bilateral    TUMOR REMOVAL     neck, ?lipoma   VALVE REPLACEMENT  10/04/2021     Current Outpatient Medications  Medication Sig Dispense Refill   acetaminophen (TYLENOL) 325 MG tablet Take by mouth. 4 tablets as needed     albuterol (VENTOLIN HFA) 108 (90 Base) MCG/ACT inhaler Inhale 2 puffs into the lungs every 6 (six) hours as needed for wheezing or shortness of breath. 18 g 3   amiodarone (PACERONE) 200 MG tablet TAKE 1 TABLET BY MOUTH EVERY DAY 90 tablet 0   atorvastatin (LIPITOR) 40 MG tablet Take 1 tablet (40 mg total) by mouth daily. 90 tablet 4   hydrocortisone 2.5 % cream Apply 1 application topically as needed.     losartan (COZAAR) 25 MG tablet Take 1 tablet (25 mg total) by mouth daily. 90 tablet 3   methocarbamol (ROBAXIN) 500 MG tablet Take 1 tablet (500 mg total) by mouth every 8 (eight) hours as needed for muscle spasms. 90 tablet 1   metoprolol succinate (TOPROL-XL) 50 MG 24 hr tablet Take 1 tablet (50 mg total) by mouth daily. 30 tablet 11   rivaroxaban (XARELTO) 20 MG TABS tablet TAKE 1 TABLET (20 MG TOTAL) BY MOUTH DAILY WITH SUPPER. TAKE WITH DINNER OR LARGEST MEAL OF THE DAY 90 tablet 0   sertraline (ZOLOFT) 50 MG tablet TAKE 1 TABLET BY MOUTH EVERY DAY 90 tablet 0   No current facility-administered medications for this visit.    Allergies:   Patient has no known allergies.    Social History:  The patient  reports that she quit smoking about 9 years ago. Her smoking use included cigarettes. She started smoking about 49 years ago. She has a 10 pack-year smoking history. She has never used smokeless tobacco. She reports current alcohol use. She  reports that she does not use drugs.   Family History:  The patient's family history includes Alcoholism in her father; CAD in her father; Diabetes in her mother; Diabetes Mellitus I in her grandson; Fibrocystic breast disease in her mother and sister; Stroke in her mother.    ROS:  Please see the history of present illness.   Otherwise, review of systems are positive for none.   All other systems are reviewed and negative.    PHYSICAL EXAM: VS:  There were no vitals taken for this visit. , BMI There is no height or weight on file to calculate BMI. GEN: Well nourished, well developed, in no acute distress  HEENT: normal  Neck: no JVD, carotid bruits, or masses Cardiac: RRR and tachycardic; no murmurs, rubs, or gallops,no edema  Respiratory:  clear to auscultation bilaterally, normal work  of breathing GI: soft, nontender, nondistended, + BS MS: no deformity or atrophy  Skin: warm and dry, no rash Neuro:  Strength and sensation are intact Psych: euthymic mood, full affect   EKG:  EKG is ordered today. The ekg ordered today demonstrates atrial tachycardia versus atypical atrial flutter with a heart rate of 109 bpm, low voltage and left axis deviation.   Recent Labs: 08/13/2022: ALT 15; BUN 20; Creatinine, Ser 1.20; Hemoglobin 11.8; Platelets 291; Potassium 4.3; Sodium 140; TSH 3.651    Lipid Panel    Component Value Date/Time   CHOL 116 08/16/2021 1103   TRIG 82 08/16/2021 1103   HDL 39 (L) 08/16/2021 1103   CHOLHDL 3.0 08/16/2021 1103   CHOLHDL 5.1 03/02/2020 0445   VLDL 18 03/02/2020 0445   LDLCALC 61 08/16/2021 1103      Wt Readings from Last 3 Encounters:  09/04/22 227 lb 6.4 oz (103.1 kg)  08/28/22 222 lb (100.7 kg)  08/15/22 222 lb (100.7 kg)           No data to display            ASSESSMENT AND PLAN:  1. Persistent atrial fibrillation: She continues to be in atypical atrial flutter versus atrial tachycardia.  She has not missed any doses of Xarelto.  I  recommend proceeding with cardioversion next week.  Continue amiodarone and metoprolol.  I discussed the procedure in details as well as risks and benefits.    2.  Status post bioprosthetic mitral valve replacement for endocarditis: Will consider an echocardiogram once she is in sinus rhythm.  In addition, her EF was reported to be mildly decreased and that will have to be followed.  3.  Chronic systolic heart failure with mildly reduced LV systolic function: This is due to nonischemic cardiomyopathy.  4.  Essential hypertension: Blood pressure is reasonably controlled  5.  Sleep apnea: Currently using CPAP.   Disposition:   FU in 1 month  Signed,  Lorine Bears, MD  12/05/2022 12:42 PM    [facility]

## 2022-12-05 NOTE — Telephone Encounter (Signed)
Requested Prescriptions  Pending Prescriptions Disp Refills   sertraline (ZOLOFT) 50 MG tablet [Pharmacy Med Name: SERTRALINE HCL 50 MG TABLET] 90 tablet 0    Sig: TAKE 1 TABLET BY MOUTH EVERY DAY     Psychiatry:  Antidepressants - SSRI - sertraline Passed - 12/04/2022  4:56 PM      Passed - AST in normal range and within 360 days    AST  Date Value Ref Range Status  08/13/2022 24 15 - 41 U/L Final         Passed - ALT in normal range and within 360 days    ALT  Date Value Ref Range Status  08/13/2022 15 0 - 44 U/L Final         Passed - Completed PHQ-2 or PHQ-9 in the last 360 days      Passed - Valid encounter within last 6 months    Recent Outpatient Visits           3 months ago Primary osteoarthritis of right knee   [facility] [place], Marzella Schlein, MD   8 months ago Primary hypertension   [place] [facility] Alfredia Ferguson, PA-C   11 months ago AF (paroxysmal atrial fibrillation) [facility])   [place] [facility] Alfredia Ferguson, PA-C   1 year ago HFrEF (heart failure with reduced ejection fraction) [facility])   [place] [facility] Alfredia Ferguson, PA-C   1 year ago HFrEF (heart failure with reduced ejection fraction) [facility])   [place] [facility] Alfredia Ferguson, PA-C       Future Appointments             Today Kirke Corin, Chelsea Aus, MD [facility] 20 MG TABS tablet [Pharmacy Med Name: XARELTO 20 MG TABLET] 90 tablet 0    Sig: TAKE 1 TABLET (20 MG TOTAL) BY MOUTH DAILY WITH SUPPER. TAKE WITH DINNER OR LARGEST MEAL OF THE DAY     Hematology: Anticoagulants - rivaroxaban Failed - 12/04/2022  4:56 PM      Failed - Cr in normal range and within 360 days    Creatinine, Ser  Date Value Ref Range Status  08/13/2022 1.20 (H) 0.44 - 1.00 mg/dL Final         Failed - HGB in normal range and within 360 days    Hemoglobin  Date Value  Ref Range Status  08/13/2022 11.8 (L) 12.0 - 15.0 g/dL Final  16/02/9603 54.0 11.1 - 15.9 g/dL Final         Passed - ALT in normal range and within 360 days    ALT  Date Value Ref Range Status  08/13/2022 15 0 - 44 U/L Final         Passed - AST in normal range and within 360 days    AST  Date Value Ref Range Status  08/13/2022 24 15 - 41 U/L Final         Passed - HCT in normal range and within 360 days    HCT  Date Value Ref Range Status  08/13/2022 39.5 36.0 - 46.0 % Final   Hematocrit  Date Value Ref Range Status  11/20/2021 37.5 34.0 - 46.6 % Final         Passed - PLT in normal range and within 360 days    Platelets  Date Value Ref Range  Status  08/13/2022 291 150 - 400 K/uL Final  11/20/2021 276 150 - 450 x10E3/uL Final         Passed - eGFR is 15 or above and within 360 days    GFR calc Af Amer  Date Value Ref Range Status  07/19/2018 >60 >60 mL/min Final   GFR, Estimated  Date Value Ref Range Status  08/13/2022 48 (L) >60 mL/min Final    Comment:    (NOTE) Calculated using the CKD-EPI Creatinine Equation (2021)    eGFR  Date Value Ref Range Status  11/20/2021 49 (L) >59 mL/min/1.73 Final         Passed - Patient is not pregnant      Passed - Valid encounter within last 12 months    Recent Outpatient Visits           3 months ago Primary osteoarthritis of right knee   [place] [facility] [place], Marzella Schlein, MD   8 months ago Primary hypertension   [place] [facility] Alfredia Ferguson, PA-C   11 months ago AF (paroxysmal atrial fibrillation) [facility])   [place] [facility] Alfredia Ferguson, PA-C   1 year ago HFrEF (heart failure with reduced ejection fraction) [facility])   [place] [facility] Alfredia Ferguson, PA-C   1 year ago HFrEF (heart failure with reduced ejection fraction) [facility])   [place] [facility] Alfredia Ferguson, [place]       Future Appointments              Today Kirke Corin, Chelsea Aus, MD [facility]

## 2022-12-06 ENCOUNTER — Encounter: Payer: Self-pay | Admitting: Cardiovascular Disease

## 2022-12-23 DIAGNOSIS — I824Z2 Acute embolism and thrombosis of unspecified deep veins of left distal lower extremity: Secondary | ICD-10-CM | POA: Diagnosis not present

## 2022-12-29 DIAGNOSIS — I10 Essential (primary) hypertension: Secondary | ICD-10-CM | POA: Diagnosis not present

## 2022-12-29 DIAGNOSIS — G4733 Obstructive sleep apnea (adult) (pediatric): Secondary | ICD-10-CM | POA: Diagnosis not present

## 2023-01-03 DIAGNOSIS — M1990 Unspecified osteoarthritis, unspecified site: Secondary | ICD-10-CM | POA: Diagnosis not present

## 2023-01-03 DIAGNOSIS — J45909 Unspecified asthma, uncomplicated: Secondary | ICD-10-CM | POA: Diagnosis not present

## 2023-01-03 DIAGNOSIS — N189 Chronic kidney disease, unspecified: Secondary | ICD-10-CM | POA: Diagnosis not present

## 2023-01-03 DIAGNOSIS — I471 Supraventricular tachycardia, unspecified: Secondary | ICD-10-CM | POA: Diagnosis not present

## 2023-01-03 DIAGNOSIS — E785 Hyperlipidemia, unspecified: Secondary | ICD-10-CM | POA: Diagnosis not present

## 2023-01-03 DIAGNOSIS — G4733 Obstructive sleep apnea (adult) (pediatric): Secondary | ICD-10-CM | POA: Diagnosis not present

## 2023-01-03 DIAGNOSIS — M62838 Other muscle spasm: Secondary | ICD-10-CM | POA: Diagnosis not present

## 2023-01-03 DIAGNOSIS — I2510 Atherosclerotic heart disease of native coronary artery without angina pectoris: Secondary | ICD-10-CM | POA: Diagnosis not present

## 2023-01-03 DIAGNOSIS — M810 Age-related osteoporosis without current pathological fracture: Secondary | ICD-10-CM | POA: Diagnosis not present

## 2023-01-03 DIAGNOSIS — R32 Unspecified urinary incontinence: Secondary | ICD-10-CM | POA: Diagnosis not present

## 2023-01-13 ENCOUNTER — Other Ambulatory Visit: Payer: Self-pay | Admitting: Family Medicine

## 2023-01-13 DIAGNOSIS — I480 Paroxysmal atrial fibrillation: Secondary | ICD-10-CM

## 2023-01-27 ENCOUNTER — Telehealth: Payer: Self-pay | Admitting: *Deleted

## 2023-01-27 ENCOUNTER — Ambulatory Visit (INDEPENDENT_AMBULATORY_CARE_PROVIDER_SITE_OTHER): Payer: Medicare HMO

## 2023-01-27 VITALS — BP 133/82 | HR 82 | Ht 65.5 in | Wt 228.5 lb

## 2023-01-27 DIAGNOSIS — Z599 Problem related to housing and economic circumstances, unspecified: Secondary | ICD-10-CM | POA: Diagnosis not present

## 2023-01-27 DIAGNOSIS — F439 Reaction to severe stress, unspecified: Secondary | ICD-10-CM

## 2023-01-27 DIAGNOSIS — Z0000 Encounter for general adult medical examination without abnormal findings: Secondary | ICD-10-CM | POA: Diagnosis not present

## 2023-01-27 NOTE — Progress Notes (Signed)
Subjective:   Tina Peters is a 72 y.o. female who presents for Medicare Annual (Subsequent) preventive examination.  Visit Complete: In person  Patient Medicare AWV questionnaire was completed by the patient on (not done); I have confirmed that all information answered by patient is correct and no changes since this date. Cardiac Risk Factors include: advanced age (>53men, >81 women);hypertension;dyslipidemia;obesity (BMI >30kg/m2);sedentary lifestyle    Objective:    Today's Vitals   01/27/23 1304 01/27/23 1305  BP: 133/82   Pulse: 82   Weight: 228 lb 8 oz (103.6 kg)   Height: 5' 5.5" (1.664 m)   PainSc:  6    Body mass index is 37.45 kg/m.     01/27/2023    1:22 PM 01/22/2022    4:08 PM 09/10/2021    1:41 PM 03/01/2020    3:46 PM 07/19/2018    1:08 PM 11/02/2016    9:14 PM 05/31/2016    9:03 PM  Advanced Directives  Does Patient Have a Medical Advance Directive? No No No No No No No  Would patient like information on creating a medical advance directive?  No - Patient declined No - Patient declined  No - Patient declined  Yes (Inpatient - patient requests chaplain consult to create a medical advance directive)    Current Medications (verified) Outpatient Encounter Medications as of 01/27/2023  Medication Sig   acetaminophen (TYLENOL) 325 MG tablet Take by mouth. 4 tablets as needed   albuterol (VENTOLIN HFA) 108 (90 Base) MCG/ACT inhaler Inhale 2 puffs into the lungs every 6 (six) hours as needed for wheezing or shortness of breath.   amiodarone (PACERONE) 200 MG tablet TAKE 1 TABLET BY MOUTH EVERY DAY   atorvastatin (LIPITOR) 40 MG tablet TAKE 1 TABLET BY MOUTH EVERY DAY   hydrocortisone 2.5 % cream Apply 1 application topically as needed.   losartan (COZAAR) 25 MG tablet Take 1 tablet (25 mg total) by mouth daily.   methocarbamol (ROBAXIN) 500 MG tablet Take 1 tablet (500 mg total) by mouth every 8 (eight) hours as needed for muscle spasms.   metoprolol succinate  (TOPROL-XL) 50 MG 24 hr tablet Take 1 tablet (50 mg total) by mouth daily.   sertraline (ZOLOFT) 50 MG tablet TAKE 1 TABLET BY MOUTH EVERY DAY   XARELTO 20 MG TABS tablet TAKE 1 TABLET (20 MG TOTAL) BY MOUTH DAILY WITH SUPPER. TAKE WITH DINNER OR LARGEST MEAL OF THE DAY   No facility-administered encounter medications on file as of 01/27/2023.    Allergies (verified) Patient has no known allergies.   History: Past Medical History:  Diagnosis Date   Allergy    Anxiety    Arthritis    Asthma    Chronic back pain    Chronic combined systolic (congestive) and diastolic (congestive) heart failure (HCC)    a. 02/2020 Echo: EF 60-65%, no rwma. Gr2 DD. Nl RV size/fxn. Mild LAE. Mild MR; b. 10/2021 Echo: [facility] following MVR) EF 40 to 45%.   Depression    Diverticulitis    Endocarditis    a. 10/2021 S sanguinous bacteremia w/ MV veg-->10mm Mitris bioprosthetic valve, Maze, and LAA clip.   Hyperlipidemia    Hypertension    Lipoma    Mild mitral regurgitation    a. 08/2016 Echo: mild MR; b. 02/2020 Echo: Mild MR.   Neuromuscular disorder (HCC)    Obesity    Obstructive sleep apnea    PAF (paroxysmal atrial fibrillation) (HCC)    a. Dx  05/2016--> Xarelto (CHA2DS2VASc = 3); b. 08/2016 Echo: EF 55-60%, no rwma, mild MR, mod dil LA; c. 03/2020 Zio: Avg HR 61 w/ Afib burden of 24% - max rate 189 (avg 93). 2 pauses - longest 4.7 secs post-conversion pauses (occurred @ 5:42 am); d. 04/2022 s/p DCCV; e. 08/2022 s/p DCCV.   Paroxysmal SVT (supraventricular tachycardia)    S/P mitral valve replacement    a. 10/2021 [facility] - Endocarditis/MV Veg-->52mm Mitris bioprosthetic valve, Maze, and LAA clip.   Sleep apnea    Stroke [facility])    a. 02/2020 MRI/A: multifocal acute ischemia in R MCA territory predominantly involving post insula and R middle frontal gyrus. Nl MRA.   Tachy-brady syndrome (HCC)    Tobacco abuse    Past Surgical History:  Procedure Laterality Date   ACHILLES TENDON SURGERY Left    BACK  SURGERY     2 ruptured discs in L spine, no hardware   CARDIOVERSION N/A 04/22/2022   Procedure: CARDIOVERSION;  Surgeon: Iran Ouch, MD;  Location: [facility] ORS;  Service: Cardiovascular;  Laterality: N/A;   CARDIOVERSION N/A 08/28/2022   Procedure: CARDIOVERSION;  Surgeon: Debbe Odea, MD;  Location: [facility] ORS;  Service: Cardiovascular;  Laterality: N/A;   KNEE ARTHROSCOPY W/ MENISCAL REPAIR Right    in the 1990s   SALPINGOOPHORECTOMY Bilateral    TUMOR REMOVAL     neck, ?lipoma   VALVE REPLACEMENT  10/04/2021   Family History  Problem Relation Age of Onset   Stroke Mother    Diabetes Mother    Fibrocystic breast disease Mother    CAD Father    Alcoholism Father    Fibrocystic breast disease Sister    Diabetes Mellitus I Grandson    Breast cancer Neg Hx    Colon cancer Neg Hx    Social History   Socioeconomic History   Marital status: Divorced    Spouse name: Not on file   Number of children: 2   Years of education: Not on file   Highest education level: Not on file  Occupational History   Occupation: retired  Tobacco Use   Smoking status: Former    Current packs/day: 0.00    Average packs/day: 0.3 packs/day for 40.0 years (10.0 ttl pk-yrs)    Types: Cigarettes    Start date: 60    Quit date: 2015    Years since quitting: 9.7   Smokeless tobacco: Never   Tobacco comments:    Used to smoke heavily, cut back in 2015  Vaping Use   Vaping status: Never Used  Substance and Sexual Activity   Alcohol use: Yes    Comment: socially, rare intake   Drug use: No   Sexual activity: Yes    Partners: Male    Birth control/protection: Post-menopausal  Other Topics Concern   Not on file  Social History Narrative   Lives alone.   Social Determinants of Health   Financial Resource Strain: Medium Risk (01/27/2023)   Overall Financial Resource Strain (CARDIA)    Difficulty of Paying Living Expenses: Somewhat hard  Food Insecurity: No Food Insecurity (01/27/2023)    Hunger Vital Sign    Worried About Running Out of Food in the Last Year: Never true    Ran Out of Food in the Last Year: Never true  Transportation Needs: No Transportation Needs (01/27/2023)   PRAPARE - Administrator, Civil Service (Medical): No    Lack of Transportation (Non-Medical): No  Physical Activity: Insufficiently Active (01/27/2023)  Exercise Vital Sign    Days of Exercise per Week: 1 day    Minutes of Exercise per Session: 20 min  Stress: Stress Concern Present (01/27/2023)   [employer] of Occupational Health - Occupational Stress Questionnaire    Feeling of Stress : To some extent  Social Connections: Socially Isolated (01/27/2023)   Social Connection and Isolation Panel [NHANES]    Frequency of Communication with Friends and Family: More than three times a week    Frequency of Social Gatherings with Friends and Family: Once a week    Attends Religious Services: Never    Database administrator or Organizations: No    Attends Engineer, structural: Never    Marital Status: Divorced    Tobacco Counseling Counseling given: Not Answered Tobacco comments: Used to smoke heavily, cut back in 2015   Clinical Intake:  Pre-visit preparation completed: Yes  Pain : 0-10 Pain Score: 6  Pain Type: Chronic pain Pain Location: Knee Pain Orientation: Right Pain Descriptors / Indicators: Aching Pain Onset: More than a month ago Pain Frequency: Constant Pain Relieving Factors: rest,tumeric  Pain Relieving Factors: rest,tumeric  BMI - recorded: 37.45 Nutritional Status: BMI > 30  Obese Nutritional Risks: None Diabetes: No  How often do you need to have someone help you when you read instructions, pamphlets, or other written materials from your doctor or pharmacy?: 1 - Never  Interpreter Needed?: No  Comments: lives alone Information entered by :: B.Harue Pribble,LPN   Activities of Daily Living    01/27/2023    1:22 PM 08/28/2022    6:54 AM   In your present state of health, do you have any difficulty performing the following activities:  Hearing? 0 0  Vision? 0 0  Difficulty concentrating or making decisions? 0 0  Walking or climbing stairs? 1 0  Dressing or bathing? 0 0  Doing errands, shopping? 0   Preparing Food and eating ? N   Using the Toilet? N   In the past six months, have you accidently leaked urine? Y   Do you have problems with loss of bowel control? N   Managing your Medications? N   Managing your Finances? N   Housekeeping or managing your Housekeeping? N     Patient Care Team: Erasmo Downer, MD as PCP - General (Family Medicine) Iran Ouch, MD as PCP - Cardiology (Cardiology) Lanier Prude, MD as PCP - Electrophysiology (Cardiology) Lonell Face, MD as Consulting Physician (Neurology) Gaspar Cola, [facility] (Inactive) as Pharmacist (Pharmacist)  Indicate any recent Medical Services you may have received from other than [facility] providers in the past year (date may be approximate).     Assessment:   This is a routine wellness examination for Tina Peters.  Hearing/Vision screen Hearing Screening - Comments:: Pt says she is hearing good Vision Screening - Comments:: Pt says sees good;readers only    Goals Addressed             This Visit's Progress    COMPLETED: DIET - EAT MORE FRUITS AND VEGETABLES         Depression Screen    01/27/2023    1:15 PM 08/15/2022    2:36 PM 04/09/2022    4:44 PM 01/22/2022    4:06 PM 01/08/2022    4:15 PM 09/06/2021    2:35 PM 08/16/2021   10:26 AM  PHQ 2/9 Scores  PHQ - 2 Score 2 1 0 0 0 0 2  PHQ- 9  Score 6 4 3  0 1 6 12     Fall Risk    01/27/2023    1:11 PM 08/15/2022    2:36 PM 04/09/2022    4:43 PM 01/22/2022    4:08 PM 09/06/2021    2:35 PM  Fall Risk   Falls in the past year? 0 0 0 0 0  Number falls in past yr: 0 0 0 0 0  Injury with Fall? 0 0 0 0 0  Risk for fall due to : No Fall Risks No Fall Risks  No Fall Risks   Follow up Falls  prevention discussed;Education provided Falls evaluation completed  Falls prevention discussed;Falls evaluation completed     MEDICARE RISK AT HOME: Medicare Risk at Home Any stairs in or around the home?: Yes If so, are there any without handrails?: Yes Home free of loose throw rugs in walkways, pet beds, electrical cords, etc?: Yes Adequate lighting in your home to reduce risk of falls?: Yes Life alert?: No Use of a cane, walker or w/c?: No Grab bars in the bathroom?: No Shower chair or bench in shower?: No Elevated toilet seat or a handicapped toilet?: Yes  TIMED UP AND GO:  Was the test performed?  Yes  Length of time to ambulate 10 feet: 12 sec Gait steady and fast without use of assistive device    Cognitive Function:        01/27/2023    1:28 PM 01/22/2022    4:12 PM  6CIT Screen  What Year? 0 points 0 points  What month? 0 points 0 points  What time? 0 points 0 points  Count back from 20 0 points 0 points  Months in reverse 0 points 0 points  Repeat phrase 6 points 0 points  Total Score 6 points 0 points    Immunizations Immunization History  Administered Date(s) Administered   PNEUMOCOCCAL CONJUGATE-20 04/09/2022   Pneumococcal Polysaccharide-23 01/19/2020   Tdap 09/25/2017    TDAP status: Up to date  Flu Vaccine status: Declined, Education has been provided regarding the importance of this vaccine but patient still declined. Advised may receive this vaccine at local pharmacy or Health Dept. Aware to provide a copy of the vaccination record if obtained from local pharmacy or Health Dept. Verbalized acceptance and understanding.  Pneumococcal vaccine status: Up to date  Covid-19 vaccine status: Declined, Education has been provided regarding the importance of this vaccine but patient still declined. Advised may receive this vaccine at local pharmacy or Health Dept.or vaccine clinic. Aware to provide a copy of the vaccination record if obtained from local  pharmacy or Health Dept. Verbalized acceptance and understanding.  Qualifies for Shingles Vaccine? Yes   Zostavax completed No   Shingrix Completed?: No.    Education has been provided regarding the importance of this vaccine. Patient has been advised to call insurance company to determine out of pocket expense if they have not yet received this vaccine. Advised may also receive vaccine at local pharmacy or Health Dept. Verbalized acceptance and understanding.  Screening Tests Health Maintenance  Topic Date Due   COVID-19 Vaccine (1) 02/12/2023 (Originally 09/25/1955)   Zoster Vaccines- Shingrix (1 of 2) 04/28/2023 (Originally 09/24/1969)   INFLUENZA VACCINE  08/04/2023 (Originally 12/05/2022)   MAMMOGRAM  01/27/2024 (Originally 09/24/2000)   Colonoscopy  01/27/2024 (Originally 09/25/1995)   Medicare Annual Wellness (AWV)  01/27/2024   DTaP/Tdap/Td (2 - Td or Tdap) 09/26/2027   Pneumonia Vaccine 76+ Years old  Completed   DEXA  SCAN  Completed   Hepatitis C Screening  Completed   HPV VACCINES  Aged Out    Health Maintenance  There are no preventive care reminders to display for this patient.   Colorectal cancer screening: Type of screening: Colonoscopy. Completed NO. Repeat every 5-10 years PT DECLINED  Mammogram status: Completed NO. Repeat every year PT DECLINES  Bone Density status: Completed YES. Results reflect: Bone density results: NORMAL. Repeat every 5 years.  Lung Cancer Screening: (Low Dose CT Chest recommended if Age 2-80 years, 20 pack-year currently smoking OR have quit w/in 15years.) does not qualify.   Lung Cancer Screening Referral: NO  Additional Screening:  Hepatitis C Screening: does not qualify; Completed YES  Vision Screening: Recommended annual ophthalmology exams for early detection of glaucoma and other disorders of the eye. Is the patient up to date with their annual eye exam?  No  Who is the provider or what is the name of the office in which the  patient attends annual eye exams? HAS NONE If pt is not established with a provider, would they like to be referred to a provider to establish care? No . PT DECLINES  Dental Screening: Recommended annual dental exams for proper oral hygiene  Diabetic Foot Exam: N/A  Community Resource Referral / Chronic Care Management: CRR required this visit?  No   CCM required this visit?  No    Plan:     I have personally reviewed and noted the following in the patient's chart:   Medical and social history Use of alcohol, tobacco or illicit drugs  Current medications and supplements including opioid prescriptions. Patient is not currently taking opioid prescriptions. Functional ability and status Nutritional status Physical activity Advanced directives List of other physicians Hospitalizations, surgeries, and ER visits in previous 12 months Vitals Screenings to include cognitive, depression, and falls Referrals and appointments  In addition, I have reviewed and discussed with patient certain preventive protocols, quality metrics, and best practice recommendations. A written personalized care plan for preventive services as well as general preventive health recommendations were provided to patient.     Sue Lush, LPN   1/61/0960   After Visit Summary: (In Person-Printed) AVS printed and given to the patient  Nurse Notes: pt relays she is still recovering from open heart surgery last year. She relays she is very stressed about her financial situation having live from less than $1,000 per month.She relays she is open to counseling to help her cope as she speaks off being angry about many things.  *Referral to [facility] made

## 2023-01-27 NOTE — Progress Notes (Signed)
Care Coordination   Note   01/27/2023 Name: Tina Peters MRN: [id number] DOB: [birth year]  Tina Peters is a 72 y.o. year old female who sees Bacigalupo, Marzella Schlein, MD for primary care. I reached out to Tina Peters by phone today to offer care coordination services.  [name] was given information about Care Coordination services today including:   The Care Coordination services include support from the care team which includes your Nurse Coordinator, Clinical Social Worker, or Pharmacist.  The Care Coordination team is here to help remove barriers to the health concerns and goals most important to you. Care Coordination services are voluntary, and the patient may decline or stop services at any time by request to their care team member.   Care Coordination Consent Status: Patient agreed to services and verbal consent obtained.   Follow up plan:  Telephone appointment with care coordination team member scheduled for:  01/31/2023  Encounter Outcome:  Patient Scheduled from referral   Burman Nieves, [facility] Care Coordination Care Guide Direct Dial: [phone]

## 2023-01-27 NOTE — Patient Instructions (Addendum)
Ms. Groseclose , Thank you for taking time to come for your Medicare Wellness Visit. I appreciate your ongoing commitment to your health goals. Please review the following plan we discussed and let me know if I can assist you in the future.   Referrals/Orders/Follow-Ups/Clinician Recommendations: none  This is a list of the screening recommended for you and due dates:  Health Maintenance  Topic Date Due   COVID-19 Vaccine (1) Never done   Zoster (Shingles) Vaccine (1 of 2) Never done   Colon Cancer Screening  Never done   Mammogram  Never done   Flu Shot  Never done   Medicare Annual Wellness Visit  01/27/2024   DTaP/Tdap/Td vaccine (2 - Td or Tdap) 09/26/2027   Pneumonia Vaccine  Completed   DEXA scan (bone density measurement)  Completed   Hepatitis C Screening  Completed   HPV Vaccine  Aged Out    Advanced directives: (Declined) Advance directive discussed with you today. Even though you declined this today, please call our office should you change your mind, and we can give you the proper paperwork for you to fill out.  Next Medicare Annual Wellness Visit scheduled for next year: Yes 01/28/24 1PM IN PERSON

## 2023-01-29 DIAGNOSIS — G4733 Obstructive sleep apnea (adult) (pediatric): Secondary | ICD-10-CM | POA: Diagnosis not present

## 2023-01-29 DIAGNOSIS — I10 Essential (primary) hypertension: Secondary | ICD-10-CM | POA: Diagnosis not present

## 2023-01-31 ENCOUNTER — Ambulatory Visit: Payer: Self-pay

## 2023-01-31 NOTE — Patient Outreach (Signed)
Care Coordination   Initial Visit Note   01/31/2023 Name: GISELDA GOLDBACH MRN: [id number] DOB: [birth year]  [name] is a 72 y.o. year old female who sees Bacigalupo, Marzella Schlein, MD for primary care. I spoke with  [name] by phone today.  What matters to the patients health and wellness today?  Patient can't afford home repairs.    Goals Addressed             This Visit's Progress    Home repairs       Interventions Today    Flowsheet Row Most Recent Value  Chronic Disease   Chronic disease during today's visit Atrial Fibrillation (AFib), Hypertension (HTN), Chronic Kidney Disease/End Stage Renal Disease (ESRD)  General Interventions   General Interventions Discussed/Reviewed General Interventions Discussed, General Interventions Reviewed, Scientist, research (physical sciences) and home repairs. Son-in-law and brother-in-law help for urgent needs but not often. Cant afford to pay.SW provides Lexicographer for [employer] & Weatherization/Home Rehab program. SW suggest speaking to local church]              SDOH assessments and interventions completed:  Yes  SDOH Interventions Today    Flowsheet Row Most Recent Value  SDOH Interventions   Food Insecurity Interventions Intervention Not Indicated  Housing Interventions Intervention Not Indicated  Transportation Interventions Intervention Not Indicated  Utilities Interventions Intervention Not Indicated        Care Coordination Interventions:  Yes, provided   Follow up plan: Follow up call scheduled for 02/12/23 at 1pm    Encounter Outcome:  Patient Visit Completed

## 2023-01-31 NOTE — Patient Instructions (Signed)
Visit Information  Thank you for taking time to visit with me today. Please don't hesitate to contact me if I can be of assistance to you.   Following are the goals we discussed today:  Patient will follow up with community resources for home repairs.   Our next appointment is by telephone on 02/12/23 at 1pm  Please call the care guide team at [phone] if you need to cancel or reschedule your appointment.   If you are experiencing a Mental Health or Behavioral Health Crisis or need someone to talk to, please call 911  Patient verbalizes understanding of instructions and care plan provided today and agrees to view in MyChart. Active MyChart status and patient understanding of how to access instructions and care plan via MyChart confirmed with patient.     Follow up with provider re: 02/12/23 at 1pm  Lysle Morales, BSW Social Worker  [phone]

## 2023-02-12 ENCOUNTER — Ambulatory Visit: Payer: Self-pay

## 2023-02-12 NOTE — Patient Instructions (Signed)
Visit Information  Thank you for taking time to visit with me today. Please don't hesitate to contact me if I can be of assistance to you.   Following are the goals we discussed today:  Patient to complete application for home repair with community resources and work with family on plumbing issues.   Our next appointment is by telephone on 02/26/23 at 1pm  Please call the care guide team at [phone] if you need to cancel or reschedule your appointment.   If you are experiencing a Mental Health or Behavioral Health Crisis or need someone to talk to, please call 911  Patient verbalizes understanding of instructions and care plan provided today and agrees to view in MyChart. Active MyChart status and patient understanding of how to access instructions and care plan via MyChart confirmed with patient.     Telephone follow up appointment with care management team member scheduled for: 02/27/24 at 1pm.

## 2023-02-12 NOTE — Patient Outreach (Signed)
Care Coordination   Follow Up Visit Note   02/12/2023 Name: MELODYE CLINGMAN MRN: [id number] DOB: 10/22/1950  [name] is a 72 y.o. year old female who sees Bacigalupo, Marzella Schlein, MD for primary care. I spoke with  [name] by phone today.  What matters to the patients health and wellness today?  Patient needs home repairs.    Goals Addressed             This Visit's Progress    Home repairs       Interventions Today    Flowsheet Row Most Recent Value  General Interventions   General Interventions Discussed/Reviewed General Interventions Discussed, General Interventions Reviewed, Community Resources  [Pt reports she contact community resources on repairs & waiting for one to return her call & the other to mail the application.Hot water was not fixed by relatives & still working to figure out the problem.Client boils water & shower at daughters house.]              SDOH assessments and interventions completed:  No     Care Coordination Interventions:  Yes, provided   Follow up plan: Follow up call scheduled for 02/26/23 at 1pm    Encounter Outcome:  Patient Visit Completed

## 2023-02-26 ENCOUNTER — Ambulatory Visit: Payer: Self-pay

## 2023-02-26 NOTE — Patient Outreach (Signed)
Care Coordination   02/26/2023 Name: Tina Peters MRN: [id number] DOB: Feb 05, 1951   Care Coordination Outreach Attempts:  An unsuccessful telephone outreach was attempted for a scheduled appointment today.  Follow Up Plan:  Additional outreach attempts will be made to offer the patient care coordination information and services.   Encounter Outcome:  No Answer   Care Coordination Interventions:  No, not indicated    Lysle Morales, BSW Social Worker [phone]

## 2023-03-08 ENCOUNTER — Other Ambulatory Visit: Payer: Self-pay | Admitting: Cardiology

## 2023-03-31 ENCOUNTER — Emergency Department: Payer: Medicare HMO

## 2023-03-31 ENCOUNTER — Encounter: Payer: Self-pay | Admitting: Emergency Medicine

## 2023-03-31 ENCOUNTER — Emergency Department
Admission: EM | Admit: 2023-03-31 | Discharge: 2023-03-31 | Disposition: A | Discharge status: Home / Self Care | Payer: Medicare HMO | Attending: Student in an Organized Health Care Education/Training Program | Admitting: Student in an Organized Health Care Education/Training Program

## 2023-03-31 ENCOUNTER — Other Ambulatory Visit: Payer: Self-pay

## 2023-03-31 DIAGNOSIS — Y92002 Bathroom of unspecified non-institutional (private) residence single-family (private) house as the place of occurrence of the external cause: Secondary | ICD-10-CM | POA: Diagnosis not present

## 2023-03-31 DIAGNOSIS — I10 Essential (primary) hypertension: Secondary | ICD-10-CM | POA: Insufficient documentation

## 2023-03-31 DIAGNOSIS — S0993XA Unspecified injury of face, initial encounter: Secondary | ICD-10-CM | POA: Diagnosis not present

## 2023-03-31 DIAGNOSIS — Z8673 Personal history of transient ischemic attack (TIA), and cerebral infarction without residual deficits: Secondary | ICD-10-CM | POA: Diagnosis not present

## 2023-03-31 DIAGNOSIS — J45909 Unspecified asthma, uncomplicated: Secondary | ICD-10-CM | POA: Insufficient documentation

## 2023-03-31 DIAGNOSIS — S0083XA Contusion of other part of head, initial encounter: Secondary | ICD-10-CM | POA: Diagnosis not present

## 2023-03-31 DIAGNOSIS — W08XXXA Fall from other furniture, initial encounter: Secondary | ICD-10-CM | POA: Diagnosis not present

## 2023-03-31 DIAGNOSIS — S0990XA Unspecified injury of head, initial encounter: Secondary | ICD-10-CM | POA: Diagnosis not present

## 2023-03-31 DIAGNOSIS — W19XXXA Unspecified fall, initial encounter: Secondary | ICD-10-CM

## 2023-03-31 DIAGNOSIS — R270 Ataxia, unspecified: Secondary | ICD-10-CM | POA: Diagnosis not present

## 2023-03-31 DIAGNOSIS — S199XXA Unspecified injury of neck, initial encounter: Secondary | ICD-10-CM | POA: Diagnosis not present

## 2023-03-31 MED ORDER — ACETAMINOPHEN 325 MG PO TABS
650.0000 mg | ORAL_TABLET | Freq: Once | ORAL | Status: AC
  Administered 2023-03-31: 650 mg via ORAL
  Filled 2023-03-31: qty 2

## 2023-03-31 NOTE — ED Triage Notes (Signed)
Patient to ED via POV for a fall. Pt reports she fell getting up to go to the restroom- legs not twisted up. States she hit head on a toy truck. Bruising noted to left eye. Denies LOC but does take blood thinners.

## 2023-03-31 NOTE — Discharge Instructions (Signed)
You can continue to take Tylenol as needed for your pain.  I also encourage you to apply ice over the area as this will help with your swelling.  Please return to the ED if you have any new or worsening symptoms, like blurry vision, dizziness, multiple episodes of vomiting or confusion.

## 2023-03-31 NOTE — ED Provider Notes (Signed)
[facility] Provider Note    Event Date/Time   First MD Initiated Contact with Patient 03/31/23 1634     (approximate)   History   Fall   HPI  ZAFIRAH CAISSE is a 72 y.o. female with PMH of HTN, asthma, A-fib, stroke, presents for evaluation after mechanical fall.  Patient states she was sitting in her recliner when she stood up to go to the bathroom and her feet got tangled up causing her to fall.  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  Since the injury she reports she has had a headache but denies vision changes, confusion and balance issues.  Patient's daughter reports that the patient has been acting like herself.      Physical Exam   Triage Vital Signs: ED Triage Vitals  Encounter Vitals Group     BP 03/31/23 1601 (!) 131/96     Systolic BP Percentile --      Diastolic BP Percentile --      Pulse Rate 03/31/23 1601 95     Resp 03/31/23 1601 18     Temp 03/31/23 1601 98.6 F (37 C)     Temp Source 03/31/23 1601 Oral     SpO2 03/31/23 1601 99 %     Weight 03/31/23 1608 227 lb (103 kg)     Height 03/31/23 1608 5\' 5"  (1.651 m)     Head Circumference --      Peak Flow --      Pain Score 03/31/23 1608 5     Pain Loc --      Pain Education --      Exclude from Growth Chart --     Most recent vital signs: Vitals:   03/31/23 1601  BP: (!) 131/96  Pulse: 95  Resp: 18  Temp: 98.6 F (37 C)  SpO2: 99%   General: Awake, no distress.  CV:  Good peripheral perfusion.  RRR. Resp:  Normal effort.  CTAB. Abd:  No distention.  Other:  Swelling and bruising to patient's left forehead surrounding her left eye, no conjunctival injection, PERRL, EOM intact, no focal neurodeficits, no ataxia.   ED Results / Procedures / Treatments   Labs (all labs ordered are listed, but only abnormal results are displayed) Labs Reviewed - No data to display   RADIOLOGY  CT head, neck and maxillofacial obtained,  interpreted the images as well as reviewed the radiologist report.  Imaging is negative aside from a left periorbital and left forehead contusion.   PROCEDURES:  Critical Care performed: No  Procedures   MEDICATIONS ORDERED IN ED: Medications  acetaminophen (TYLENOL) tablet 650 mg (650 mg Oral Given 03/31/23 1723)     IMPRESSION / MDM / ASSESSMENT AND PLAN / ED COURSE  I reviewed the triage vital signs and the nursing notes.                             72 year old female presents for evaluation after mechanical fall.  Patient was hypertensive in triage otherwise vital signs are stable.  Patient NAD on exam.  Differential diagnosis includes, but is not limited to, orbital bone fracture, concussion, intracranial hemorrhage, skull fracture.  Patient's presentation is most consistent with acute complicated illness / injury requiring diagnostic workup.  CT of the head, neck and maxillofacial were all negative for any acute abnormalities aside from a periorbital and  left forehead contusion.  Patient does have a significant amount of bruising and swelling to the left forehead and left eye.  She was given Tylenol for pain while in the ED.  I also had her apply ice to the area.  She was advised to do the same at home.  Given the negative imaging and reassuring findings on physical exam, I do feel she is safe for outpatient management.  She was given return precautions.  Patient voiced understanding, all questions were answered and she was stable at discharge.    FINAL CLINICAL IMPRESSION(S) / ED DIAGNOSES   Final diagnoses:  Fall, initial encounter  Contusion of face, initial encounter     Rx / DC Orders   ED Discharge Orders     None        Note:  This document was prepared using Dragon voice recognition software and may include unintentional dictation errors.   Cameron Ali, PA-C 03/31/23 1810    Willy Eddy, MD 03/31/23 2235

## 2023-04-14 ENCOUNTER — Other Ambulatory Visit: Payer: Self-pay | Admitting: Family Medicine

## 2023-04-14 ENCOUNTER — Other Ambulatory Visit: Payer: Self-pay | Admitting: Cardiology

## 2023-04-14 DIAGNOSIS — I4819 Other persistent atrial fibrillation: Secondary | ICD-10-CM

## 2023-04-14 DIAGNOSIS — F411 Generalized anxiety disorder: Secondary | ICD-10-CM

## 2023-04-14 DIAGNOSIS — I480 Paroxysmal atrial fibrillation: Secondary | ICD-10-CM

## 2023-04-14 NOTE — Telephone Encounter (Signed)
Hi Ashley,  Could you schedule this patient an over due follow up visit with Dr. Lalla Brothers? The patient was [place] on 08-13-22 and she wanted the patient to follow up in 2-3 months with Dr. Lalla Brothers. The patient had an appointment with him on  10-23-2022, but she did not show up to that appointment. Thank you so much.

## 2023-04-17 NOTE — Telephone Encounter (Signed)
Last visit: 08/13/22 with plan to f/u in  2-3 months  next visit: none NS 10/23/22  Please schedule follow up appt, Thanks!

## 2023-04-17 NOTE — Telephone Encounter (Signed)
 Scheduled 01/10.

## 2023-05-15 NOTE — Progress Notes (Deleted)
 Cardiology Office Note Date:  05/15/2023  Patient ID:  Tina, Peters 01-20-1951, MRN [id number] PCP:  Myrla Jon HERO, MD  Cardiologist:  Deatrice Cage, MD Electrophysiologist: OLE ONEIDA HOLTS, MD    Chief Complaint: Afib follow-up  History of Present Illness: Tina Peters is a 73 y.o. female with PMH notable for persistent Afib, HTN, OSA on CPAP, CKD stage 3, CVA, HFmrEF, prolonged QTC; seen today for OLE ONEIDA HOLTS, MD for routine electrophysiology followup.   Briefly, she has history of Afib with post-conversion pauses. Her Afib had been managed with multaq  for some time. In June 2023, she presented to [facility] with strep. 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 S/p MVR, maze, left atrial appendage clip. Complicated, prolonged hospital course. She had post-op afib and was switched to amiodarone  during hospitalization.  I saw her 08/2022 where she was in atrial flutter. She is s/p DCCV 4/24. She saw NP Vivienne 5/1 where she was in sinus rhtyhm and planning for close follow-up.  On follow-up today,  *** AF burden, symptoms *** palpitations *** bleeding concerns  - needs amio labs and CBC   No bleeding issues on xarelto   she denies chest pain, orthopnea, nausea, vomiting, dizziness, syncope, edema, weight gain, or early satiety.    AAD History: Multaq  - stopped at [facility] post-op AF after MVR Amiodarone     ROS:  Please see the history of present illness. All other systems are reviewed and otherwise negative.    PHYSICAL EXAM:  VS:  There were no vitals taken for this visit. BMI: There is no height or weight on file to calculate BMI.  Wt Readings from Last 3 Encounters:  03/31/23 227 lb (103 kg)  01/27/23 228 lb 8 oz (103.6 kg)  09/04/22 227 lb 6.4 oz (103.1 kg)    GEN- The patient is well appearing, alert and oriented x 3 today.   Lungs- Clear to ausculation bilaterally, normal work of breathing.  Heart- Regular, tachy rate and rhythm, no  murmurs, rubs or gallops, Extremities- Trace peripheral edema, warm and dry   EKG is ordered. Personal review of EKG from today shows: Typical counter-clockwise 2:1 and 3:1 Aflutter, rate 103bpm.    Additional studies reviewed include: Previous EP, cardiology notes.   TTE 07/24/2022  1. Left ventricular ejection fraction, by estimation, is 45%. Left ventricular ejection fraction by PLAX is 43 %. The left ventricle has mildly decreased function. The left ventricle demonstrates global hypokinesis. Left ventricular diastolic parameters are indeterminate.   2. Right ventricular systolic function is low normal. The right ventricular size is normal.   3. Left atrial size was mildly dilated.   4. Right atrial size was mildly dilated.   5. The mitral valve has been repaired/replaced. No evidence of mitral valve regurgitation. There is a bioprosthetic valve present in the mitral position.   6. The aortic valve is tricuspid. Aortic valve regurgitation is not visualized. Aortic valve sclerosis/calcification is present, without any evidence of aortic stenosis.   7. The inferior vena cava is normal in size with greater than 50% respiratory variability, suggesting right atrial pressure of 3 mmHg.   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   Long term monitor, 12/19/2021 HR 42 - 150 bpm, average 53 bpm. 25 SVT, longest 13 beats. Rare supraventricular and ventricular ectopy. No sustained arrhythmias. No atrial fibrillation.  LHC, 10/09/2021 ([facility] in [place]) L radial access with US  guidance  Mild CAD only  Normal LVEDP.  ASSESSMENT AND PLAN:  #) persistent Afib / Aflutter #) s/p Maintaining sinus rhtyhm on amiodarone  200mg  daily Update amio labs today   #) Hypercoag d/t persis afib CHA2DS2-VASc Score = at least 7 [CHF History: 1, HTN History: 1, Diabetes History: 0, Stroke History: 2, Vascular Disease History: 1, Age Score: 1, Gender Score: 1].  Therefore, the patient's annual  risk of stroke is 11.2 %.     {Confirm score is correct.  If not, click here to update score.  REFRESH note.  :1}   Stroke ppx - 20mg  xarelto  daily, appropriately dosed No bleeding concerns   #) HFmrEF Appears euvolemic on exam; warm and dry, no lower extremity edema   #) HTN BP readings have improved Continue losartan  at current dose for now given she has only taken it about a week Recommended she keep BP log at home Consider up-titration at future appts   Current medicines are reviewed at length with the patient today.   The patient does not have concerns regarding her medicines.  The following changes were made today:   INCREASE amiodarone  to 200mg  BID x 14 days, then reduce to 200mg  daily  Labs/ tests ordered today include:  No orders of the defined types were placed in this encounter.    Disposition: Follow up with Dr. Cindie in  2-3 months     Signed, Chantal Needle, NP  05/15/23  7:36 PM  Electrophysiology [facility]

## 2023-05-16 ENCOUNTER — Ambulatory Visit: Payer: Medicare HMO | Admitting: Cardiology

## 2023-05-16 ENCOUNTER — Ambulatory Visit: Payer: Medicare HMO | Attending: Cardiology | Admitting: Cardiology

## 2023-05-16 ENCOUNTER — Encounter: Payer: Self-pay | Admitting: Cardiology

## 2023-05-16 VITALS — BP 160/60 | HR 92 | Ht 65.0 in | Wt 229.0 lb

## 2023-05-16 DIAGNOSIS — I4819 Other persistent atrial fibrillation: Secondary | ICD-10-CM

## 2023-05-16 DIAGNOSIS — D6869 Other thrombophilia: Secondary | ICD-10-CM

## 2023-05-16 DIAGNOSIS — I5022 Chronic systolic (congestive) heart failure: Secondary | ICD-10-CM | POA: Diagnosis not present

## 2023-05-16 DIAGNOSIS — I10 Essential (primary) hypertension: Secondary | ICD-10-CM | POA: Diagnosis not present

## 2023-05-16 DIAGNOSIS — Z79899 Other long term (current) drug therapy: Secondary | ICD-10-CM

## 2023-05-16 NOTE — Patient Instructions (Signed)
 Medication Instructions:   Your physician recommends that you continue on your current medications as directed. Please refer to the Current Medication list given to you today.  *If you need a refill on your cardiac medications before your next appointment, please call your pharmacy*   Lab Work:  Your provider would like for you to have the following labs drawn:   CBC CMP MAGNESIUM  TSH T4     If you have labs (blood work) drawn today and your tests are completely normal, you will receive your results only by: MyChart Message (if you have MyChart) OR A paper copy in the mail If you have any lab test that is abnormal or we need to change your treatment, we will call you to review the results.   Testing/Procedures:  None ordered today   Follow-Up: At [facility], you and your health needs are our priority.  As part of our continuing mission to provide you with exceptional heart care, we have created designated Provider Care Teams.  These Care Teams include your primary Cardiologist (physician) and Advanced Practice Providers (APPs -  Physician Assistants and Nurse Practitioners) who all work together to provide you with the care you need, when you need it.  We recommend signing up for the patient portal called MyChart.  Sign up information is provided on this After Visit Summary.  MyChart is used to connect with patients for Virtual Visits (Telemedicine).  Patients are able to view lab/test results, encounter notes, upcoming appointments, etc.  Non-urgent messages can be sent to your provider as well.   To learn more about what you can do with MyChart, go to [email or web address].    Your next appointment:   6 month(s)  Provider:   Suzann Riddle, NP

## 2023-05-16 NOTE — Progress Notes (Signed)
 Cardiology Office Note Date:  05/16/2023  Patient ID:  Tina Peters [birth year], MRN [id number] PCP:  Myrla Jon HERO, MD  Cardiologist:  Deatrice Cage, MD Electrophysiologist: OLE ONEIDA HOLTS, MD    Chief Complaint: Afib follow-up  History of Present Illness: Tina Peters is a 73 y.o. female with PMH notable for persistent Afib, HTN, OSA on CPAP, CKD stage 3, CVA, HFmrEF, prolonged QTC; seen today for OLE ONEIDA HOLTS, MD for routine electrophysiology followup.   Briefly, she has history of Afib with post-conversion pauses. Her Afib had been managed with multaq  for some time. In June 2023, she presented to [facility] with strep. 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 S/p MVR, maze, left atrial appendage clip. Complicated, prolonged hospital course. She had post-op afib and was switched to amiodarone  during hospitalization.  I saw her 08/2022 where she was in atrial flutter. She is s/p DCCV 4/24. She saw NP Vivienne 5/1 where she was in sinus rhythm and planning for close follow-up. She has not seen by cardiology since that time.  On follow-up today, she has no cardiac awareness of her afib/flutter. She denies palpiations, ches tpain, chest pressure. She has noticed a decrease in exercise tolerance, but attributes it to her R knee pain from surgery. She denies SOB. She does have increased indigestion and burping, no acid reflux. She continues to take amiodarone  and xarelto  daily, no missed doses.  She did not take any AM medications this morning d/t waking up late. No bleeding issues on xarelto . Uses cpap nightly, can't sleep without it.  When discussing cardioversion, she says that she feels terrible after the procedure for several days.   AAD History: Multaq  - stopped at [facility] post-op AF after MVR Amiodarone     ROS:  Please see the history of present illness. All other systems are reviewed and otherwise negative.    PHYSICAL EXAM:  VS:  BP (!) 160/60 (BP  Location: Left Arm, Patient Position: Sitting, Cuff Size: Large)   Pulse 92   Ht 5' 5 (1.651 m)   Wt 229 lb (103.9 kg)   BMI 38.11 kg/m  BMI: Body mass index is 38.11 kg/m.  Wt Readings from Last 3 Encounters:  05/16/23 229 lb (103.9 kg)  03/31/23 227 lb (103 kg)  01/27/23 228 lb 8 oz (103.6 kg)    GEN- The patient is well appearing, alert and oriented x 3 today.   Lungs- Clear to ausculation bilaterally, normal work of breathing.  Heart- Regular, tachy rate and rhythm, no murmurs, rubs or gallops, Extremities- No peripheral edema, warm and dry   EKG is ordered. Personal review of EKG from today shows:  EKG Interpretation Date/Time:  Friday May 16 2023 13:08:06 EST Ventricular Rate:  92 PR Interval:    QRS Duration:  92 QT Interval:  378 QTC Calculation: 467 R Axis:   -24  Text Interpretation: Atrial flutter Confirmed by Bodie Abernethy [phone]) on 05/16/2023 1:14:34 PM   - appears typical counter-clockwise   Additional studies reviewed include: Previous EP, cardiology notes.   TTE 07/24/2022  1. Left ventricular ejection fraction, by estimation, is 45%. Left ventricular ejection fraction by PLAX is 43 %. The left ventricle has mildly decreased function. The left ventricle demonstrates global hypokinesis. Left ventricular diastolic parameters are indeterminate.   2. Right ventricular systolic function is low normal. The right ventricular size is normal.   3. Left atrial size was mildly dilated.   4. Right atrial size was mildly  dilated.   5. The mitral valve has been repaired/replaced. No evidence of mitral valve regurgitation. There is a bioprosthetic valve present in the mitral position.   6. The aortic valve is tricuspid. Aortic valve regurgitation is not visualized. Aortic valve sclerosis/calcification is present, without any evidence of aortic stenosis.   7. The inferior vena cava is normal in size with greater than 50% respiratory variability, suggesting right atrial  pressure of 3 mmHg.   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   Long term monitor, 12/19/2021 HR 42 - 150 bpm, average 53 bpm. 25 SVT, longest 13 beats. Rare supraventricular and ventricular ectopy. No sustained arrhythmias. No atrial fibrillation.  LHC, 10/09/2021 ([facility] in [place]) L radial access with US  guidance  Mild CAD only  Normal LVEDP.    ASSESSMENT AND PLAN:  #) persistent Afib / Aflutter #) Amiodarone  monitoring Has converted to aflutter at some point between 09/2022 and today. She has no cardiac awareness She does not want to proceed with cardioversion d/t side effects post-procedure Briefly discuss aflutter ablation procedure, will discuss further with Dr. Cindie and call patient. Update amio labs today  #) Hypercoag d/t persis afib CHA2DS2-VASc Score = at least 7 [CHF History: 1, HTN History: 1, Diabetes History: 0, Stroke History: 2, Vascular Disease History: 1, Age Score: 1, Gender Score: 1].  Therefore, the patient's annual risk of stroke is 11.2 %.    Stroke ppx - 20mg  xarelto  daily, appropriately dosed No bleeding concerns Update CBC  #) HFmrEF Appears euvolemic on exam; warm and dry, no lower extremity edema  #) HTN Elevated in office today, patient did not take AM meds today Recommended to take BP medicaitons prior to appts -Advise to monitor blood pressure at home after resting and without recent caffeine intake. Record and bring results to appointments. Continue losartan   #) OSA Encouraged nightly CPAP usage    Current medicines are reviewed at length with the patient today.   The patient does not have concerns regarding her medicines.  The following changes were made today:   none  Labs/ tests ordered today include:  Orders Placed This Encounter  Procedures   EKG 12-Lead     Disposition: Follow up with Dr. Cindie in  6 months     Signed, Edita Weyenberg, NP  05/16/23  1:15 PM  Electrophysiology [facility]

## 2023-05-17 LAB — CBC
Hematocrit: 38.5 % (ref 34.0–46.6)
Hemoglobin: 11.9 g/dL (ref 11.1–15.9)
MCH: 26.7 pg (ref 26.6–33.0)
MCHC: 30.9 g/dL — ABNORMAL LOW (ref 31.5–35.7)
MCV: 86 fL (ref 79–97)
Platelets: 254 10*3/uL (ref 150–450)
RBC: 4.46 x10E6/uL (ref 3.77–5.28)
RDW: 14.4 % (ref 11.7–15.4)
WBC: 8.7 10*3/uL (ref 3.4–10.8)

## 2023-05-17 LAB — COMPREHENSIVE METABOLIC PANEL
ALT: 18 [IU]/L (ref 0–32)
AST: 25 [IU]/L (ref 0–40)
Albumin: 4.2 g/dL (ref 3.8–4.8)
Alkaline Phosphatase: 99 [IU]/L (ref 44–121)
BUN/Creatinine Ratio: 10 — ABNORMAL LOW (ref 12–28)
BUN: 13 mg/dL (ref 8–27)
Bilirubin Total: 0.5 mg/dL (ref 0.0–1.2)
CO2: 23 mmol/L (ref 20–29)
Calcium: 9.4 mg/dL (ref 8.7–10.3)
Chloride: 106 mmol/L (ref 96–106)
Creatinine, Ser: 1.3 mg/dL — ABNORMAL HIGH (ref 0.57–1.00)
Globulin, Total: 2.6 g/dL (ref 1.5–4.5)
Glucose: 119 mg/dL — ABNORMAL HIGH (ref 70–99)
Potassium: 4.4 mmol/L (ref 3.5–5.2)
Sodium: 146 mmol/L — ABNORMAL HIGH (ref 134–144)
Total Protein: 6.8 g/dL (ref 6.0–8.5)
eGFR: 44 mL/min/{1.73_m2} — ABNORMAL LOW (ref >59)

## 2023-05-17 LAB — MAGNESIUM: Magnesium: 1.9 mg/dL (ref 1.6–2.3)

## 2023-05-17 LAB — TSH: TSH: 1.88 u[IU]/mL (ref 0.450–4.500)

## 2023-05-17 LAB — T4: T4, Total: 12.8 ug/dL — ABNORMAL HIGH (ref 4.5–12.0)

## 2023-05-19 ENCOUNTER — Telehealth: Payer: Self-pay | Admitting: Cardiology

## 2023-05-19 DIAGNOSIS — I4892 Unspecified atrial flutter: Secondary | ICD-10-CM

## 2023-05-19 NOTE — Telephone Encounter (Signed)
 I attempted to call patient to discuss follow-up steps for Aflutter.  No answer, no patient identifiers. LM to return call

## 2023-05-20 ENCOUNTER — Encounter: Payer: Self-pay | Admitting: Emergency Medicine

## 2023-05-21 NOTE — Addendum Note (Signed)
 Addended by: Elvia Hammans on: 05/21/2023 01:56 PM   Modules accepted: Orders

## 2023-05-21 NOTE — Telephone Encounter (Signed)
 I called patient to discuss next steps regarding her arrhythmia.   Recommend an updated echo to further eval LVEF.

## 2023-05-26 ENCOUNTER — Telehealth: Payer: Self-pay | Admitting: Cardiology

## 2023-05-26 NOTE — Telephone Encounter (Signed)
Left voicemail, needs echo scheduled and an appt scheduled with Sherie Don after TTA.

## 2023-05-28 DIAGNOSIS — I10 Essential (primary) hypertension: Secondary | ICD-10-CM | POA: Diagnosis not present

## 2023-05-28 DIAGNOSIS — G4733 Obstructive sleep apnea (adult) (pediatric): Secondary | ICD-10-CM | POA: Diagnosis not present

## 2023-06-01 ENCOUNTER — Other Ambulatory Visit: Payer: Self-pay

## 2023-06-01 ENCOUNTER — Emergency Department: Payer: Medicare HMO

## 2023-06-01 ENCOUNTER — Emergency Department
Admission: EM | Admit: 2023-06-01 | Discharge: 2023-06-02 | Disposition: A | Discharge status: Home / Self Care | Payer: Medicare HMO | Attending: Emergency Medicine | Admitting: Emergency Medicine

## 2023-06-01 DIAGNOSIS — Z8673 Personal history of transient ischemic attack (TIA), and cerebral infarction without residual deficits: Secondary | ICD-10-CM | POA: Insufficient documentation

## 2023-06-01 DIAGNOSIS — R509 Fever, unspecified: Secondary | ICD-10-CM | POA: Insufficient documentation

## 2023-06-01 DIAGNOSIS — M791 Myalgia, unspecified site: Secondary | ICD-10-CM

## 2023-06-01 DIAGNOSIS — I110 Hypertensive heart disease with heart failure: Secondary | ICD-10-CM | POA: Diagnosis not present

## 2023-06-01 DIAGNOSIS — Z20822 Contact with and (suspected) exposure to covid-19: Secondary | ICD-10-CM | POA: Insufficient documentation

## 2023-06-01 DIAGNOSIS — R002 Palpitations: Secondary | ICD-10-CM | POA: Insufficient documentation

## 2023-06-01 DIAGNOSIS — R079 Chest pain, unspecified: Secondary | ICD-10-CM | POA: Diagnosis not present

## 2023-06-01 DIAGNOSIS — B349 Viral infection, unspecified: Secondary | ICD-10-CM | POA: Insufficient documentation

## 2023-06-01 DIAGNOSIS — I509 Heart failure, unspecified: Secondary | ICD-10-CM | POA: Diagnosis not present

## 2023-06-01 DIAGNOSIS — I480 Paroxysmal atrial fibrillation: Secondary | ICD-10-CM | POA: Diagnosis not present

## 2023-06-01 DIAGNOSIS — R519 Headache, unspecified: Secondary | ICD-10-CM | POA: Diagnosis not present

## 2023-06-01 LAB — CBC
HCT: 37.8 % (ref 36.0–46.0)
Hemoglobin: 12 g/dL (ref 12.0–15.0)
MCH: 26.3 pg (ref 26.0–34.0)
MCHC: 31.7 g/dL (ref 30.0–36.0)
MCV: 82.9 fL (ref 80.0–100.0)
Platelets: 177 10*3/uL (ref 150–400)
RBC: 4.56 MIL/uL (ref 3.87–5.11)
RDW: 15.4 % (ref 11.5–15.5)
WBC: 22.9 10*3/uL — ABNORMAL HIGH (ref 4.0–10.5)
nRBC: 0 % (ref 0.0–0.2)

## 2023-06-01 LAB — BASIC METABOLIC PANEL
Anion gap: 11 (ref 5–15)
BUN: 19 mg/dL (ref 8–23)
CO2: 21 mmol/L — ABNORMAL LOW (ref 22–32)
Calcium: 8.6 mg/dL — ABNORMAL LOW (ref 8.9–10.3)
Chloride: 104 mmol/L (ref 98–111)
Creatinine, Ser: 1.36 mg/dL — ABNORMAL HIGH (ref 0.44–1.00)
GFR, Estimated: 41 mL/min — ABNORMAL LOW (ref >60)
Glucose, Bld: 176 mg/dL — ABNORMAL HIGH (ref 70–99)
Potassium: 3.4 mmol/L — ABNORMAL LOW (ref 3.5–5.1)
Sodium: 136 mmol/L (ref 135–145)

## 2023-06-01 LAB — TROPONIN I (HIGH SENSITIVITY)
Troponin I (High Sensitivity): 46 ng/L — ABNORMAL HIGH (ref <18)
Troponin I (High Sensitivity): 45 ng/L — ABNORMAL HIGH (ref <18)

## 2023-06-01 LAB — TSH: TSH: 5.142 u[IU]/mL — ABNORMAL HIGH (ref 0.350–4.500)

## 2023-06-01 LAB — SARS CORONAVIRUS 2 BY RT PCR: SARS Coronavirus 2 by RT PCR: NEGATIVE

## 2023-06-01 LAB — T4, FREE: Free T4: 1.96 ng/dL — ABNORMAL HIGH (ref 0.61–1.12)

## 2023-06-01 MED ORDER — ACETAMINOPHEN 500 MG PO TABS: 1000.0000 mg | ORAL_TABLET | Freq: Once | ORAL | Status: DC

## 2023-06-01 NOTE — ED Triage Notes (Signed)
Pt to ED for "I've just been feeling bad" since about 2 days. Hx a fib and feels like heart is racing, sharp pain "like a knife" between shoulder blades, body aches, chills and feeling hot, HA, and on and off lightheaded. Took 2 tylenol PTA. No fevers at home. Has been cardioverted several times for SVT.  EKG reading a flutter, rate 102. Skin dry.

## 2023-06-02 LAB — URINALYSIS, W/ REFLEX TO CULTURE (INFECTION SUSPECTED)
Bacteria, UA: NONE SEEN
Bilirubin Urine: NEGATIVE
Glucose, UA: NEGATIVE mg/dL
Ketones, ur: NEGATIVE mg/dL
Leukocytes,Ua: NEGATIVE
Nitrite: NEGATIVE
Protein, ur: NEGATIVE mg/dL
Specific Gravity, Urine: 1.011 (ref 1.005–1.030)
pH: 5 (ref 5.0–8.0)

## 2023-06-02 LAB — RESP PANEL BY RT-PCR (RSV, FLU A&B, COVID)  RVPGX2
Influenza A by PCR: NEGATIVE
Influenza B by PCR: NEGATIVE
Resp Syncytial Virus by PCR: NEGATIVE
SARS Coronavirus 2 by RT PCR: NEGATIVE

## 2023-06-02 NOTE — ED Notes (Signed)
..  The patient is A&OX4, ambulatory at d/c with independent steady gait, but wheeled out of ED via wheelchair. NAD. Pt verbalized understanding of d/c instructions and follow up care.

## 2023-06-02 NOTE — ED Provider Notes (Signed)
[facility] Provider Note    Event Date/Time   First MD Initiated Contact with Patient 06/01/23 2303     (approximate)   History   Palpitations and multiple complaints   HPI  Tina Peters is a 73 y.o. female   Past medical history of paroxysmal atrial fibrillation/atrial flutter, hypertension hyperlipidemia, prior stroke, CHF who presents to the emergency department with 3 days of fatigue, myalgias, subjective fever and chills, headache, nausea but no vomiting.  She denies any cough or congestion and has no known sick contacts.  She does report occasional palpitations that are longstanding and unchanged, has been following with cardiologist regarding cardioversion or ablation.  She denies specifically chest pain, but she does ache all over including her chest, all her muscles in her back.  She denies shortness of breath.   Independent Historian contributed to assessment above: Her daughter is at bedside to corroborate information and past medical history as above  External Medical Documents Reviewed: Cardiology notes within this past year documenting her atrial flutter and cardioversions and plan for potential ablation in the future      Physical Exam   Triage Vital Signs: ED Triage Vitals  Encounter Vitals Group     BP 06/01/23 1444 102/75     Systolic BP Percentile --      Diastolic BP Percentile --      Pulse Rate 06/01/23 1444 99     Resp 06/01/23 1444 20     Temp 06/01/23 1444 98.5 F (36.9 C)     Temp Source 06/01/23 1444 Oral     SpO2 06/01/23 1444 98 %     Weight 06/01/23 1450 229 lb (103.9 kg)     Height 06/01/23 1450 5\' 5"  (1.651 m)     Head Circumference --      Peak Flow --      Pain Score 06/01/23 1441 10     Pain Loc --      Pain Education --      Exclude from Growth Chart --     Most recent vital signs: Vitals:   06/02/23 0030 06/02/23 0100  BP: (!) 143/61 123/63  Pulse: 88 96  Resp: (!) 26 (!) 22  Temp:    SpO2:  98% 98%    General: Awake, no distress.  CV:  Good peripheral perfusion.  Resp:  Normal effort.  Abd:  No distention.  Other:  Awake alert comfortable appearing in no acute distress with normal vital signs.  Clear lungs, soft nontender abdomen, skin appears warm well-perfused and she appears euvolemic overall.  Neck is supple with full range of motion.  She is nontoxic-appearing.   ED Results / Procedures / Treatments   Labs (all labs ordered are listed, but only abnormal results are displayed) Labs Reviewed  BASIC METABOLIC PANEL - Abnormal; Notable for the following components:      Result Value   Potassium 3.4 (*)    CO2 21 (*)    Glucose, Bld 176 (*)    Creatinine, Ser 1.36 (*)    Calcium 8.6 (*)    GFR, Estimated 41 (*)    All other components within normal limits  CBC - Abnormal; Notable for the following components:   WBC 22.9 (*)    All other components within normal limits  URINALYSIS, W/ REFLEX TO CULTURE (INFECTION SUSPECTED) - Abnormal; Notable for the following components:   Color, Urine YELLOW (*)    APPearance CLEAR (*)  Hgb urine dipstick SMALL (*)    All other components within normal limits  TSH - Abnormal; Notable for the following components:   TSH 5.142 (*)    All other components within normal limits  T4, FREE - Abnormal; Notable for the following components:   Free T4 1.96 (*)    All other components within normal limits  TROPONIN I (HIGH SENSITIVITY) - Abnormal; Notable for the following components:   Troponin I (High Sensitivity) 46 (*)    All other components within normal limits  TROPONIN I (HIGH SENSITIVITY) - Abnormal; Notable for the following components:   Troponin I (High Sensitivity) 45 (*)    All other components within normal limits  SARS CORONAVIRUS 2 BY RT PCR  RESP PANEL BY RT-PCR (RSV, FLU A&B, COVID)  [id number]     I ordered and reviewed the above labs they are notable for she has white blood cell count of 22.9, and elevated T4  slightly elevated from prior levels today at 1.96.  Her troponins are flat but slightly elevated from her normal in the 20s, today in the 40s.  EKG  ED ECG REPORT I, Pilar Jarvis, the attending physician, personally viewed and interpreted this ECG.   Date: 06/02/2023  EKG Time: 1448  Rate: 102  Rhythm: a flutter  Axis: nl  Intervals:none  ST&T Change: no stemi    RADIOLOGY I independently reviewed and interpreted chest x-ray and I see no obvious focality pneumothorax I also reviewed radiologist's formal read.   PROCEDURES:  Critical Care performed: No  Procedures   MEDICATIONS ORDERED IN ED: Medications  acetaminophen (TYLENOL) tablet 1,000 mg (1,000 mg Oral Not Given 06/02/23 0029)   IMPRESSION / MDM / ASSESSMENT AND PLAN / ED COURSE  I reviewed the triage vital signs and the nursing notes.                                Patient's presentation is most consistent with acute presentation with potential threat to life or bodily function.  Differential diagnosis includes, but is not limited to, viral illness, viral URI, bacterial pneumonia, ACS, electrolyte disturbance, dysrhythmia   The patient is on the cardiac monitor to evaluate for evidence of arrhythmia and/or significant heart rate changes.  MDM:    Overall her symptoms resemble a viral illness, flulike illness, however her viral testing is negative, no respiratory complaints and a clear chest x-ray and clear lung sounds doubt bacterial pneumonia.  No urinary symptoms to suggest UTI and urinalysis looks normal.  She denies chest pain specifically but instead has muscle aches and soreness all throughout her body more reflective of viral illness, and so I doubt ACS especially in light of normal-appearing EKG and flat troponins in the 40s.  I think that her thyroid is reactive from her viral illness and she certainly does not appear thyrotoxic at this time.  I considered hospitalization for admission or observation  however given her vital sign stability, nontoxic appearance, and largely unremarkable workup I think outpatient management is appropriate at this time and she was warned to return to the emerged part with any new or worsening symptoms.  She will follow-up with PMD.  At this time I doubt life-threatening emergencies like sepsis or cardiopulmonary emergency.        FINAL CLINICAL IMPRESSION(S) / ED DIAGNOSES   Final diagnoses:  Palpitations  Myalgia  Nonintractable headache, unspecified chronicity pattern, unspecified headache type  Viral syndrome     Rx / DC Orders   ED Discharge Orders     None        Note:  This document was prepared using Dragon voice recognition software and may include unintentional dictation errors.    Pilar Jarvis, MD 06/02/23 [phone]

## 2023-06-02 NOTE — Discharge Instructions (Signed)
Your evaluation in the emergency department fortunately did not find any emergency conditions accounting for your symptoms.  I think you are suffering from a virus.  Your troponin heart levels were elevated but stable and did not show signs of a heart attack.  Your thyroid was slightly high which many times is reactive due to your viral illness.  Have your doctor recheck these levels within the next couple of weeks.  Your urine test did not show infection.  Your x-ray of the chest did not show any bacterial pneumonia.  Drink plenty of fluids to stay well-hydrated and take Tylenol 650 mg every 6 hours as needed for aches and pains and fevers.  Thank you for choosing [place] for your health care today!  Please see your primary doctor this week for a follow up appointment.   If you have any new, worsening, or unexpected symptoms call your doctor right away or come back to the emergency department for reevaluation.  It was my pleasure to care for you today.   Daneil Dan Modesto Charon, MD

## 2023-06-03 ENCOUNTER — Ambulatory Visit: Payer: Self-pay

## 2023-06-03 ENCOUNTER — Ambulatory Visit (INDEPENDENT_AMBULATORY_CARE_PROVIDER_SITE_OTHER): Payer: Medicare HMO | Admitting: Family Medicine

## 2023-06-03 ENCOUNTER — Encounter: Payer: Self-pay | Admitting: Family Medicine

## 2023-06-03 ENCOUNTER — Telehealth: Payer: Self-pay

## 2023-06-03 VITALS — BP 122/70 | HR 92 | Temp 97.8°F | Resp 16 | Ht 65.0 in | Wt 226.0 lb

## 2023-06-03 DIAGNOSIS — R110 Nausea: Secondary | ICD-10-CM | POA: Diagnosis not present

## 2023-06-03 DIAGNOSIS — I482 Chronic atrial fibrillation, unspecified: Secondary | ICD-10-CM

## 2023-06-03 DIAGNOSIS — R42 Dizziness and giddiness: Secondary | ICD-10-CM | POA: Diagnosis not present

## 2023-06-03 DIAGNOSIS — D72829 Elevated white blood cell count, unspecified: Secondary | ICD-10-CM

## 2023-06-03 DIAGNOSIS — R197 Diarrhea, unspecified: Secondary | ICD-10-CM

## 2023-06-03 DIAGNOSIS — R6889 Other general symptoms and signs: Secondary | ICD-10-CM

## 2023-06-03 DIAGNOSIS — M791 Myalgia, unspecified site: Secondary | ICD-10-CM

## 2023-06-03 DIAGNOSIS — Z09 Encounter for follow-up examination after completed treatment for conditions other than malignant neoplasm: Secondary | ICD-10-CM

## 2023-06-03 MED ORDER — ONDANSETRON 4 MG PO TBDP
4.0000 mg | ORAL_TABLET | Freq: Three times a day (TID) | ORAL | 0 refills | Status: DC | PRN
Start: 2023-06-03 — End: 2023-06-27

## 2023-06-03 NOTE — Transitions of Care (Post Inpatient/ED Visit) (Signed)
06/03/2023  Name: Tina Peters MRN: [id number] DOB: Dec 21, 1950  Today's TOC FU Call Status: Today's TOC FU Call Status:: Successful TOC FU Call Completed TOC FU Call Complete Date: 06/03/23 Patient's Name and Date of Birth confirmed.  Transition Care Management Follow-up Telephone Call Date of Discharge: 06/02/23 Discharge Facility: [facility]) Type of Discharge: Emergency Department Reason for ED Visit: Other: (palpitations) How have you been since you were released from the hospital?: Same Any questions or concerns?: No  Items Reviewed: Did you receive and understand the discharge instructions provided?: Yes Medications obtained,verified, and reconciled?: Yes (Medications Reviewed) Any new allergies since your discharge?: No Dietary orders reviewed?: Yes Do you have support at home?: No  Medications Reviewed Today: Medications Reviewed Today     Reviewed by Karena Addison, LPN (Licensed Practical Nurse) on 06/03/23 at 1131  Med List Status: <None>   Medication Order Taking? Sig Documenting Provider Last Dose Status Informant  acetaminophen (TYLENOL) 325 MG tablet [id number] No Take by mouth. 4 tablets as needed [provider] Taking Active Multiple Informants  albuterol (VENTOLIN HFA) 108 (90 Base) MCG/ACT inhaler [id number] No Inhale 2 puffs into the lungs every 6 (six) hours as needed for wheezing or shortness of breath. Erasmo Downer, MD Taking Active   amiodarone (PACERONE) 200 MG tablet [id number] No Take 1 tablet (200 mg total) by mouth daily. Overdue follow up visit.  PLEASE CALL OFFICE TO SCHEDULE APPOINTMENT PRIOR TO NEXT REFILL Sherie Don, NP Taking Active   atorvastatin (LIPITOR) 40 MG tablet [id number] No TAKE 1 TABLET BY MOUTH EVERY DAY Bacigalupo, Marzella Schlein, MD Taking Active   hydrocortisone 2.5 % cream [id number] No Apply 1 application topically as needed. [provider] Taking Active   losartan (COZAAR)  25 MG tablet [id number] No Take 1 tablet (25 mg total) by mouth daily. Creig Hines, NP Taking Active   methocarbamol (ROBAXIN) 500 MG tablet [id number] No TAKE 1 TABLET BY MOUTH EVERY 8 HOURS AS NEEDED FOR MUSCLE SPASMS. Erasmo Downer, MD Taking Active   metoprolol succinate (TOPROL-XL) 50 MG 24 hr tablet [id number] No TAKE 1 TABLET BY MOUTH EVERY DAY Iran Ouch, MD Taking Active   rivaroxaban (XARELTO) 20 MG TABS tablet [id number] No TAKE 1 TABLET (20 MG TOTAL) BY MOUTH DAILY WITH SUPPER. TAKE WITH DINNER OR LARGEST MEAL OF THE DAY Bacigalupo, Marzella Schlein, MD Taking Active   sertraline (ZOLOFT) 50 MG tablet [id number] No TAKE 1 TABLET BY MOUTH EVERY DAY Bacigalupo, Marzella Schlein, MD Taking Active   spironolactone (ALDACTONE) 25 MG tablet [id number]  Take 0.5 tablets by mouth daily. [provider]  Active             Home Care and Equipment/Supplies: Were Home Health Services Ordered?: NA Any new equipment or medical supplies ordered?: NA  Functional Questionnaire: Do you need assistance with bathing/showering or dressing?: No Do you need assistance with meal preparation?: No Do you need assistance with eating?: No Do you have difficulty maintaining continence: No Do you need assistance with getting out of bed/getting out of a chair/moving?: No Do you have difficulty managing or taking your medications?: No  Follow up appointments reviewed: PCP Follow-up appointment confirmed?: Yes (declined appt) MD Provider Line Number:[phone] Given: No Date of PCP follow-up appointment?: 06/03/23 Follow-up Provider: [facility] Follow-up appointment confirmed?: NA Do you need transportation to your follow-up appointment?: No Do you understand care options if your condition(s) worsen?: Yes-patient verbalized understanding  SIGNATURE Karena Addison, LPN [facility] Nurse Health Advisor Direct Dial [phone]

## 2023-06-03 NOTE — Patient Instructions (Signed)
EKG looks better than when in the ER Your vital signs today are also reassuring I ordered labs which you can go do at [facility] main medical mall to recheck hydration, kidney function, etc and your white count.  I think you still are having many symptoms of an infection - still seems most likely to be a viral infection and we did an expanded viral test that may give [place] more info or a diagnosis.  Recommend starting zyrtec or claritin once daily at bedtime, you can use zofran for nausea, continue to push fluids, yse [id number] mg tylenol q 6 hours for body aches, chills, fever  Viral Illness, Adult Viruses are tiny germs that can get into a person's body and cause illness. There are many different types of viruses. And they cause many types of illness. Viral illnesses can range from mild to severe. They can affect various parts of the body. Short-term conditions that are caused by a virus include colds and flu (influenza) and stomach viruses. Long-term conditions that are caused by a virus include herpes, shingles, and human immunodeficiency virus (HIV) infection. A few viruses have been linked to certain cancers. What are the causes? Many types of viruses can cause illness. Viruses get into cells in your body, multiply, and cause the infected cells to work differently or die. When these cells die, they release more of the virus. When this happens, you get symptoms of the illness and the virus spreads to other cells. If the virus takes over how the cell works, it can cause the cell to divide and grow out of control. This happens when a virus causes cancer. Different viruses get into the body in different ways. You can get a virus by: Swallowing food or water that has come in contact with the virus. Breathing in droplets that have been coughed or sneezed into the air by an infected person. Touching a surface that has the virus on it and then touching your eyes, nose, or mouth. Being bitten by an insect or  animal that carries the virus. Having sexual contact with a person who is infected with the virus. Being exposed to blood or fluids that contain the virus, either through an open cut or during a transfusion. If a virus enters your body, your body's disease-fighting system (immune system) will try to fight the virus. You may be at higher risk for a viral illness if your immune system is weak. What are the signs or symptoms? Symptoms depend on the type of virus and the location of the cells that it gets into. Symptoms can include: For cold and flu viruses: Fever. Headache. Sore throat. Muscle aches. Stuffy nose (nasal congestion). Cough. For stomach (gastrointestinal) viruses: Fever. Pain in the abdomen. Nausea or vomiting. Diarrhea. For liver viruses (hepatitis): Loss of appetite. Feeling tired. Skin or the white parts of your eyes turning yellow (jaundice). For brain and spinal cord viruses: Fever. Headache. Stiff neck. Nausea and vomiting. Confusion or being sleepy. For skin viruses: Warts. Itching. Rash. For sexually transmitted viruses: Discharge. Swelling. Redness. Rash. How is this diagnosed? This condition may be diagnosed based on one or more of these: Your symptoms and medical history. A physical exam. Tests, such as: Blood tests. Tests on a sample of mucus from your lungs (sputum sample). Tests on a poop (stool) sample. Tests on a swab of body fluids or a skin sore (lesion). How is this treated? Viruses can be hard to treat because they live within cells. Antibiotics do not  treat viruses because these medicines do not get inside cells. Treatment for a viral illness may include: Resting and drinking a lot of fluids. Medicines to treat symptoms. These can include over-the-counter medicine for pain and fever, medicines for cough or congestion, and medicines for diarrhea. Antiviral medicines. These medicines are available only for certain types of viruses. Some  viral illnesses can be prevented with vaccinations. A common example is the flu shot. Follow these instructions at home: Medicines Take over-the-counter and prescription medicines only as told by your health care provider. If you were prescribed an antiviral medicine, take it as told by your provider. Do not stop taking the antiviral even if you start to feel better. Know when antibiotics are needed and when they are not needed. Antibiotics do not treat viruses. You may get an antibiotic if your provider thinks that you may have, or are at risk for, a bacterial infection and you have a viral infection. Do not ask for an antibiotic prescription if you have been diagnosed with a viral illness. Antibiotics will not make your illness go away faster. Taking antibiotics when they are not needed can lead to antibiotic resistance. When this develops, the medicine no longer works against the bacteria that it normally fights. General instructions Drink enough fluids to keep your pee (urine) pale yellow. Rest as much as possible. Return to your normal activities as told by your provider. Ask your provider what activities are safe for you. How is this prevented? To lower your risk of getting another viral illness: Wash your hands often with soap and water for at least 20 seconds. If soap and water are not available, use hand sanitizer. Avoid touching your nose, eyes, and mouth, especially if you have not washed your hands recently. If anyone in your household has a viral infection, clean all household surfaces that may have been in contact with the virus. Use soap and hot water. You may also use a commercially prepared, bleach-containing solution. Stay away from people who are sick with symptoms of a viral infection. Do not share items such as toothbrushes and water bottles with other people. Keep your vaccinations up to date. This includes getting a yearly flu shot. Eat a healthy diet and get plenty of  rest. Contact a health care provider if: You have symptoms of a viral illness that do not go away. Your symptoms come back after going away. Your symptoms get worse. Get help right away if: You have trouble breathing. You have a severe headache or a stiff neck. You have severe vomiting or pain in your abdomen. These symptoms may be an emergency. Get help right away. Call 911. Do not wait to see if the symptoms will go away. Do not drive yourself to the hospital. This information is not intended to replace advice given to you by your health care provider. Make sure you discuss any questions you have with your health care provider. Document Revised: 05/08/2022 Document Reviewed: 02/20/2022 Elsevier Patient Education  2024 [employer].

## 2023-06-03 NOTE — Telephone Encounter (Signed)
  Chief Complaint: Feels very poorly - possibly dehydrated, elevated wbc count.  Symptoms: above Frequency: At least since 1/26/205 Pertinent Negatives: Patient denies  Disposition: [] ED /[] Urgent Care (no appt availability in office) / [x] Appointment(In office/virtual)/ []  [facility]/ [] Home Care/ [] Refused Recommended Disposition /[] [place] [facility]/ []  Follow-up with PCP Additional Notes: Returned daughter's call. Daughter states that pt has been feeling poorly since before 1/26/205. Pt has elevated WBC count. Pt was seen at another primary care on 06/01/2023 and was also seen at Ed. Daughter feels that pt may be septic. She has been septic in the past.  Appt for this afternoon at [facility].  Summary: Symptomatic, no appt available.   Pt's daughter called reporting that the patient has a viral infection and needs an antibiotic, she was seen at the ED on Sunday and they did not prescribe anything for her.  Pt is possibly dehydrated  Best contact: [phone]  No appt available soon enough, needs abx. Pt has fever symptoms, body aches, chills/hot flashes. Heart Rate on monitor kept beeping irregular the entire time she was in the ED.     Reason for Disposition  [1] Fever > 101 F (38.3 C) AND [2] age > 60 years  Answer Assessment - Initial Assessment Questions 15. FEVER: "Do you have a fever?" If Yes, ask: "What is your temperature, how was it measured, and when did it start?"     Chills, and hot flashes 6. SEVERITY: "Overall, how bad are you feeling right now?" (e.g., doesn't interfere with normal activities, staying home from school/work, staying in bed)      Very poorly 7. OTHER SYMPTOMS: "Do you have any other symptoms?" (e.g., sore throat, earache, wheezing, vomiting)     BA, dehydrated  Protocols used: Common Cold-A-AH

## 2023-06-03 NOTE — Progress Notes (Signed)
Patient ID: Tina Peters, female    DOB: 17-Jul-1950, 72 y.o.   MRN: [id number]  PCP: Erasmo Downer, MD  Chief Complaint  Patient presents with   Hot Flashes   Diarrhea    Twice    Subjective:   Tina Peters is a 73 y.o. female, presents to clinic with CC of the following:  HPI  Patient recently went to walk-in clinic at [facility] and then the ER for palpitations, flulike symptoms The ER encounter was on 1/26 2 days ago -MDM per ED MD is that she likely had a viral illness all ER testing was negative including chest x-ray urine, COVID flu and RSV She did have a white count of 22 Is, here today concerned with no significant improvement in history of sepsis  Still having hot and cold chills, sweats  Head aches  Just started having loose stool  Nauseated   Denies chest pain, palpitations, shortness of breath, orthopnea, lower extremity edema, abdominal pain back pain vomiting.  She is able to eat and drink and has been pushing a lot of electrolytes and fluids yesterday and urinating normally.  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 01/09/2022   Persistent atrial fibrillation (HCC) 11/22/2021   Tachycardia 11/19/2021   History of mitral valve replacement 11/08/2021   HFrEF (heart failure with reduced ejection fraction) (HCC) 11/08/2021   Chronic a-fib (HCC) 11/08/2021   Limb ischemia 11/08/2021   Abscess, dental 10/12/2021   At risk for adverse drug event 10/12/2021   Long term (current) use of antibiotics 10/12/2021   Pulmonary edema cardiac  cause (HCC) 10/08/2021   Acute bacterial endocarditis 10/02/2021   Streptococcal bacteremia 09/30/2021   Atrial fibrillation with RVR (HCC) 09/29/2021   Chronic diarrhea 09/29/2021   Inflammatory bowel disease 09/21/2021   Acute pain of right knee 09/12/2021   Primary osteoarthritis of right knee 09/12/2021   Chronic diarrhea of unknown origin 09/06/2021   Hyperlipidemia 08/16/2021   Prediabetes 08/16/2021   Stage 3a chronic kidney disease (HCC) 08/16/2021   GAD (generalized anxiety disorder) 08/16/2021   Mild intermittent asthma without complication 08/16/2021   Moderate episode of recurrent major depressive disorder (HCC) 08/16/2021   OSA (obstructive sleep apnea) 03/01/2020   Chronic anticoagulation 03/01/2020   History of CVA (cerebrovascular accident) 03/01/2020   Low back pain 12/23/2016   Lumbar radiculopathy 12/23/2016   PAC (premature atrial contraction)    PAT (paroxysmal atrial tachycardia) (HCC)    AF (paroxysmal atrial fibrillation) (HCC)    Chronic venous insufficiency 09/29/2014   Paroxysmal SVT (supraventricular tachycardia) (HCC) 07/15/2014   Hypertension       Current Outpatient Medications:    acetaminophen (TYLENOL) 325 MG tablet, Take by mouth. 4 tablets as needed, Disp: , Rfl:    albuterol (VENTOLIN HFA) 108 (90 Base) MCG/ACT inhaler, Inhale 2 puffs into the lungs every 6 (six) hours as needed for wheezing or shortness of breath., Disp: 18 g, Rfl: 3   amiodarone (PACERONE) 200 MG tablet, Take 1 tablet (200 mg total) by mouth daily.  Overdue follow up visit.  PLEASE CALL OFFICE TO SCHEDULE APPOINTMENT PRIOR TO NEXT REFILL, Disp: 90 tablet, Rfl: 0   atorvastatin (LIPITOR) 40 MG tablet, TAKE 1 TABLET BY MOUTH EVERY DAY, Disp: 90 tablet, Rfl: 4   hydrocortisone 2.5 % cream, Apply 1 application topically as needed., Disp: , Rfl:    losartan (COZAAR) 25 MG tablet, Take 1 tablet (25 mg total) by mouth daily., Disp: 90 tablet, Rfl: 3   methocarbamol (ROBAXIN) 500 MG  tablet, TAKE 1 TABLET BY MOUTH EVERY 8 HOURS AS NEEDED FOR MUSCLE SPASMS., Disp: 90 tablet, Rfl: 1   metoprolol succinate (TOPROL-XL) 50 MG 24 hr tablet, TAKE 1 TABLET BY MOUTH EVERY DAY, Disp: 90 tablet, Rfl: 3   rivaroxaban (XARELTO) 20 MG TABS tablet, TAKE 1 TABLET (20 MG TOTAL) BY MOUTH DAILY WITH SUPPER. TAKE WITH DINNER OR LARGEST MEAL OF THE DAY, Disp: 90 tablet, Rfl: 1   sertraline (ZOLOFT) 50 MG tablet, TAKE 1 TABLET BY MOUTH EVERY DAY, Disp: 90 tablet, Rfl: 1   spironolactone (ALDACTONE) 25 MG tablet, Take 0.5 tablets by mouth daily., Disp: , Rfl:    No Known Allergies   Social History   Tobacco Use   Smoking status: Former    Current packs/day: 0.00    Average packs/day: 0.3 packs/day for 40.0 years (10.0 ttl pk-yrs)    Types: Cigarettes    Start date: 73    Quit date: 2015    Years since quitting: 10.0   Smokeless tobacco: Never   Tobacco comments:    Used to smoke heavily, cut back in 2015  Vaping Use   Vaping status: Never Used  Substance Use Topics   Alcohol use: Yes    Comment: socially, rare intake   Drug use: No      Chart Review Today: I personally reviewed active problem list, medication list, allergies, family history, social history, health maintenance, notes from last encounter, lab results, imaging with the patient/caregiver today.   Review of Systems  Constitutional:  Positive for chills, diaphoresis and fatigue. Negative for appetite change and fever (though not checking temperature).  HENT:  Positive for congestion and rhinorrhea. Negative for postnasal drip, sinus pressure, sinus pain, sneezing and sore throat.   Eyes: Negative.   Respiratory: Negative.  Negative for apnea, cough, chest tightness, shortness of breath and wheezing.   Cardiovascular: Negative.  Negative for chest pain, palpitations and leg swelling.  Gastrointestinal:  Positive for diarrhea. Negative for abdominal pain, blood in stool, constipation, nausea and vomiting.   Endocrine: Negative.   Genitourinary: Negative.   Musculoskeletal: Negative.   Skin: Negative.   Allergic/Immunologic: Negative.   Neurological: Negative.   Hematological: Negative.   Psychiatric/Behavioral: Negative.    All other systems reviewed and are negative.      Objective:   Vitals:   06/03/23 1417  BP: 122/70  Pulse: 92  Resp: 16  Temp: 97.8 F (36.6 C)  SpO2: 99%  Weight: 226 lb (102.5 kg)  Height: 5\' 5"  (1.651 m)    Body mass index is 37.61 kg/m.  Physical Exam Vitals and nursing note reviewed.  Constitutional:      General: She is not in acute distress.    Appearance: She is well-developed. She is obese. She is not ill-appearing, toxic-appearing or diaphoretic.  HENT:     Head: Normocephalic and atraumatic.     Right Ear: External ear normal.     Left Ear: External ear normal.     Nose: Nose normal.  Mouth/Throat:     Mouth: Mucous membranes are moist.     Pharynx: Oropharynx is clear. No oropharyngeal exudate or posterior oropharyngeal erythema.  Eyes:     General:        Right eye: No discharge.        Left eye: No discharge.     Conjunctiva/sclera: Conjunctivae normal.  Neck:     Trachea: No tracheal deviation.  Cardiovascular:     Rate and Rhythm: Normal rate. Rhythm irregularly irregular.     Chest Wall: PMI is not displaced.     Pulses:          Radial pulses are 1+ on the right side and 1+ on the left side.     Heart sounds: No murmur heard.    No friction rub.  Pulmonary:     Effort: Pulmonary effort is normal. No respiratory distress.     Breath sounds: Normal breath sounds. No stridor. No wheezing, rhonchi or rales.  Musculoskeletal:        General: Normal range of motion.     Right lower leg: 1+ Edema present.     Left lower leg: 1+ Edema present.  Skin:    General: Skin is warm and dry.     Findings: No rash.  Neurological:     Mental Status: She is alert.     Motor: No abnormal muscle tone.     Coordination:  Coordination normal.  Psychiatric:        Behavior: Behavior normal.      Results for orders placed or performed during the hospital encounter of 06/01/23  Resp panel by RT-PCR (RSV, Flu A&B, Covid) Anterior Nasal Swab   Collection Time: 06/01/23  2:52 PM   Specimen: Anterior Nasal Swab  Result Value Ref Range   SARS Coronavirus 2 by RT PCR NEGATIVE NEGATIVE   Influenza A by PCR NEGATIVE NEGATIVE   Influenza B by PCR NEGATIVE NEGATIVE   Resp Syncytial Virus by PCR NEGATIVE NEGATIVE  SARS Coronavirus 2 by RT PCR (hospital order, performed in [facility] hospital lab) *cepheid single result test* Anterior Nasal Swab   Collection Time: 06/01/23  2:53 PM   Specimen: Anterior Nasal Swab  Result Value Ref Range   SARS Coronavirus 2 by RT PCR NEGATIVE NEGATIVE  Basic metabolic panel   Collection Time: 06/01/23  2:53 PM  Result Value Ref Range   Sodium 136 135 - 145 mmol/L   Potassium 3.4 (L) 3.5 - 5.1 mmol/L   Chloride 104 98 - 111 mmol/L   CO2 21 (L) 22 - 32 mmol/L   Glucose, Bld 176 (H) 70 - 99 mg/dL   BUN 19 8 - 23 mg/dL   Creatinine, Ser 1.61 (H) 0.44 - 1.00 mg/dL   Calcium 8.6 (L) 8.9 - 10.3 mg/dL   GFR, Estimated 41 (L) >60 mL/min   Anion gap 11 5 - 15  CBC   Collection Time: 06/01/23  2:53 PM  Result Value Ref Range   WBC 22.9 (H) 4.0 - 10.5 K/uL   RBC 4.56 3.87 - 5.11 MIL/uL   Hemoglobin 12.0 12.0 - 15.0 g/dL   HCT 09.6 04.5 - 40.9 %   MCV 82.9 80.0 - 100.0 fL   MCH 26.3 26.0 - 34.0 pg   MCHC 31.7 30.0 - 36.0 g/dL   RDW 81.1 91.4 - 78.2 %   Platelets 177 150 - 400 K/uL   nRBC 0.0 0.0 - 0.2 %  Troponin I (High Sensitivity)  Collection Time: 06/01/23  2:53 PM  Result Value Ref Range   Troponin I (High Sensitivity) 46 (H) <18 ng/L  TSH   Collection Time: 06/01/23  6:43 PM  Result Value Ref Range   TSH 5.142 (H) 0.350 - 4.500 uIU/mL  T4, free   Collection Time: 06/01/23  6:43 PM  Result Value Ref Range   Free T4 1.96 (H) 0.61 - 1.12 ng/dL  Troponin I (High  Sensitivity)   Collection Time: 06/01/23  6:43 PM  Result Value Ref Range   Troponin I (High Sensitivity) 45 (H) <18 ng/L  Urinalysis, w/ Reflex to Culture (Infection Suspected) -Urine, Clean Catch   Collection Time: 06/01/23 11:52 PM  Result Value Ref Range   Specimen Source URINE, CLEAN CATCH    Color, Urine YELLOW (A) YELLOW   APPearance CLEAR (A) CLEAR   Specific Gravity, Urine 1.011 1.005 - 1.030   pH 5.0 5.0 - 8.0   Glucose, UA NEGATIVE NEGATIVE mg/dL   Hgb urine dipstick SMALL (A) NEGATIVE   Bilirubin Urine NEGATIVE NEGATIVE   Ketones, ur NEGATIVE NEGATIVE mg/dL   Protein, ur NEGATIVE NEGATIVE mg/dL   Nitrite NEGATIVE NEGATIVE   Leukocytes,Ua NEGATIVE NEGATIVE   RBC / HPF 0-5 0 - 5 RBC/hpf   WBC, UA 0-5 0 - 5 WBC/hpf   Bacteria, UA NONE SEEN NONE SEEN   Squamous Epithelial / HPF 0-5 0 - 5 /HPF       Assessment & Plan:       ICD-10-CM   1. Flu-like symptoms  R68.89 Respiratory virus panel    CBC with Differential/Platelet    Comprehensive metabolic panel    CANCELED: Comprehensive metabolic panel    CANCELED: CBC with Differential/Platelet   viral syndrome sx continued for 4 d, which i reassured pt is not uncommon even though we don't yet know what it is    2. Diarrhea, unspecified type  R19.7 Respiratory virus panel   2 episodes in 2 days w/o abd pain, likely viral, continue to maintain hydration    3. Myalgia  M79.10 Respiratory virus panel   x 3-4 days, muscle compartments soft    4. Lightheaded  R42 EKG 12-Lead    CBC with Differential/Platelet    Comprehensive metabolic panel    CANCELED: Comprehensive metabolic panel    CANCELED: CBC with Differential/Platelet   orthostatics negative and VS reassuring, pt appears well hydrated    5. Leukocytosis, unspecified type  D72.829 Respiratory virus panel    CBC with Differential/Platelet    CANCELED: CBC with Differential/Platelet   offered to recheck with outpt labs at [facility] to recheck, would not be surprised  if still similar ~22k with continued sx    6. Chronic a-fib (HCC)  I48.20 EKG 12-Lead   irregular rate/rhythm rate <100, ECG reassuring    7. Nausea  R11.0 ondansetron (ZOFRAN-ODT) 4 MG disintegrating tablet   no vomiting, can try zofran, continue to push fluids    8. Encounter for examination following treatment at hospital  [id number]    walk in clinic and ED notes/results thoroughly reviewed with pt today      At this time patient politely declined to go to [facility] outpt lab for stat labs, she states if she felt worse she would instead present back to the ED and get labs done there, which I agree is appropriate.  At this time pt appears well-hydrated her vital signs were stable she was not orthostatic, she did have a thorough evaluation while in the  ER less than 48 hours ago, she does not have any worsening or localizing symptoms including no further pain between her shoulder blades no shortness of breath no abdominal pain and all of her current symptoms can be explained by febrile or flulike illness which she notes most concerning to her are hot and cold chills, sweats, generalized fatigue malaise headache and bodyaches. I did order an expanded respiratory viral panel which may point to other seasonal viruses which I have explained to the patient and her daughter we are seeing in majority of patients at this time with similar symptoms and with symptoms lasting more than just 1 to 2 days several patients are having 5 days to 2 weeks of symptoms, which seem more severe than they would expect with a negative COVID flu and RSV.     Danelle Berry, PA-C 06/03/23 2:27 PM

## 2023-06-05 LAB — RESPIRATORY VIRUS PANEL

## 2023-06-09 ENCOUNTER — Telehealth: Payer: Self-pay | Admitting: Family Medicine

## 2023-06-09 ENCOUNTER — Other Ambulatory Visit: Payer: Self-pay

## 2023-06-09 ENCOUNTER — Inpatient Hospital Stay
Admission: EM | Admit: 2023-06-09 | Discharge: 2023-06-17 | DRG: 314 | Disposition: A | Discharge status: Home Health | Payer: Medicare HMO | Attending: Osteopathic Medicine | Admitting: Osteopathic Medicine

## 2023-06-09 ENCOUNTER — Ambulatory Visit: Payer: Self-pay

## 2023-06-09 DIAGNOSIS — E860 Dehydration: Secondary | ICD-10-CM | POA: Diagnosis present

## 2023-06-09 DIAGNOSIS — I2489 Other forms of acute ischemic heart disease: Secondary | ICD-10-CM | POA: Diagnosis present

## 2023-06-09 DIAGNOSIS — R002 Palpitations: Secondary | ICD-10-CM | POA: Diagnosis present

## 2023-06-09 DIAGNOSIS — I330 Acute and subacute infective endocarditis: Secondary | ICD-10-CM | POA: Diagnosis present

## 2023-06-09 DIAGNOSIS — B9689 Other specified bacterial agents as the cause of diseases classified elsewhere: Secondary | ICD-10-CM | POA: Diagnosis present

## 2023-06-09 DIAGNOSIS — R7881 Bacteremia: Secondary | ICD-10-CM | POA: Diagnosis present

## 2023-06-09 DIAGNOSIS — I480 Paroxysmal atrial fibrillation: Secondary | ICD-10-CM | POA: Diagnosis present

## 2023-06-09 DIAGNOSIS — K5790 Diverticulosis of intestine, part unspecified, without perforation or abscess without bleeding: Secondary | ICD-10-CM | POA: Diagnosis not present

## 2023-06-09 DIAGNOSIS — I471 Supraventricular tachycardia, unspecified: Secondary | ICD-10-CM | POA: Diagnosis present

## 2023-06-09 DIAGNOSIS — K529 Noninfective gastroenteritis and colitis, unspecified: Secondary | ICD-10-CM | POA: Diagnosis present

## 2023-06-09 DIAGNOSIS — K828 Other specified diseases of gallbladder: Secondary | ICD-10-CM | POA: Diagnosis not present

## 2023-06-09 DIAGNOSIS — Z7982 Long term (current) use of aspirin: Secondary | ICD-10-CM

## 2023-06-09 DIAGNOSIS — Z5986 Financial insecurity: Secondary | ICD-10-CM

## 2023-06-09 DIAGNOSIS — R7989 Other specified abnormal findings of blood chemistry: Secondary | ICD-10-CM

## 2023-06-09 DIAGNOSIS — T826XXA Infection and inflammatory reaction due to cardiac valve prosthesis, initial encounter: Principal | ICD-10-CM | POA: Diagnosis present

## 2023-06-09 DIAGNOSIS — A419 Sepsis, unspecified organism: Principal | ICD-10-CM | POA: Insufficient documentation

## 2023-06-09 DIAGNOSIS — N179 Acute kidney failure, unspecified: Secondary | ICD-10-CM | POA: Diagnosis not present

## 2023-06-09 DIAGNOSIS — Z79899 Other long term (current) drug therapy: Secondary | ICD-10-CM

## 2023-06-09 DIAGNOSIS — I5020 Unspecified systolic (congestive) heart failure: Secondary | ICD-10-CM | POA: Diagnosis present

## 2023-06-09 DIAGNOSIS — E872 Acidosis, unspecified: Secondary | ICD-10-CM | POA: Diagnosis present

## 2023-06-09 DIAGNOSIS — I38 Endocarditis, valve unspecified: Secondary | ICD-10-CM

## 2023-06-09 DIAGNOSIS — K838 Other specified diseases of biliary tract: Secondary | ICD-10-CM | POA: Diagnosis not present

## 2023-06-09 DIAGNOSIS — K5730 Diverticulosis of large intestine without perforation or abscess without bleeding: Secondary | ICD-10-CM | POA: Diagnosis present

## 2023-06-09 DIAGNOSIS — E871 Hypo-osmolality and hyponatremia: Secondary | ICD-10-CM | POA: Diagnosis present

## 2023-06-09 DIAGNOSIS — Z8249 Family history of ischemic heart disease and other diseases of the circulatory system: Secondary | ICD-10-CM

## 2023-06-09 DIAGNOSIS — I214 Non-ST elevation (NSTEMI) myocardial infarction: Secondary | ICD-10-CM | POA: Diagnosis present

## 2023-06-09 DIAGNOSIS — N1831 Chronic kidney disease, stage 3a: Secondary | ICD-10-CM | POA: Diagnosis present

## 2023-06-09 DIAGNOSIS — E785 Hyperlipidemia, unspecified: Secondary | ICD-10-CM | POA: Diagnosis present

## 2023-06-09 DIAGNOSIS — Z823 Family history of stroke: Secondary | ICD-10-CM

## 2023-06-09 DIAGNOSIS — Z1152 Encounter for screening for COVID-19: Secondary | ICD-10-CM

## 2023-06-09 DIAGNOSIS — R112 Nausea with vomiting, unspecified: Secondary | ICD-10-CM

## 2023-06-09 DIAGNOSIS — Z792 Long term (current) use of antibiotics: Secondary | ICD-10-CM

## 2023-06-09 DIAGNOSIS — I4891 Unspecified atrial fibrillation: Secondary | ICD-10-CM | POA: Diagnosis present

## 2023-06-09 DIAGNOSIS — E876 Hypokalemia: Secondary | ICD-10-CM | POA: Diagnosis present

## 2023-06-09 DIAGNOSIS — G4733 Obstructive sleep apnea (adult) (pediatric): Secondary | ICD-10-CM | POA: Diagnosis present

## 2023-06-09 DIAGNOSIS — N189 Chronic kidney disease, unspecified: Secondary | ICD-10-CM

## 2023-06-09 DIAGNOSIS — E66813 Obesity, class 3: Secondary | ICD-10-CM | POA: Diagnosis present

## 2023-06-09 DIAGNOSIS — I130 Hypertensive heart and chronic kidney disease with heart failure and stage 1 through stage 4 chronic kidney disease, or unspecified chronic kidney disease: Secondary | ICD-10-CM | POA: Diagnosis present

## 2023-06-09 DIAGNOSIS — I2510 Atherosclerotic heart disease of native coronary artery without angina pectoris: Secondary | ICD-10-CM | POA: Diagnosis present

## 2023-06-09 DIAGNOSIS — E66812 Obesity, class 2: Secondary | ICD-10-CM | POA: Insufficient documentation

## 2023-06-09 DIAGNOSIS — Z6841 Body Mass Index (BMI) 40.0 and over, adult: Secondary | ICD-10-CM

## 2023-06-09 DIAGNOSIS — Z7901 Long term (current) use of anticoagulants: Secondary | ICD-10-CM

## 2023-06-09 DIAGNOSIS — R6520 Severe sepsis without septic shock: Secondary | ICD-10-CM | POA: Diagnosis present

## 2023-06-09 DIAGNOSIS — I482 Chronic atrial fibrillation, unspecified: Secondary | ICD-10-CM | POA: Diagnosis present

## 2023-06-09 DIAGNOSIS — D649 Anemia, unspecified: Secondary | ICD-10-CM | POA: Diagnosis present

## 2023-06-09 DIAGNOSIS — K8020 Calculus of gallbladder without cholecystitis without obstruction: Secondary | ICD-10-CM | POA: Diagnosis not present

## 2023-06-09 DIAGNOSIS — Z833 Family history of diabetes mellitus: Secondary | ICD-10-CM

## 2023-06-09 DIAGNOSIS — Z87891 Personal history of nicotine dependence: Secondary | ICD-10-CM

## 2023-06-09 DIAGNOSIS — I10 Essential (primary) hypertension: Secondary | ICD-10-CM | POA: Diagnosis present

## 2023-06-09 DIAGNOSIS — I059 Rheumatic mitral valve disease, unspecified: Secondary | ICD-10-CM | POA: Diagnosis present

## 2023-06-09 DIAGNOSIS — I4892 Unspecified atrial flutter: Secondary | ICD-10-CM | POA: Diagnosis present

## 2023-06-09 DIAGNOSIS — K760 Fatty (change of) liver, not elsewhere classified: Secondary | ICD-10-CM | POA: Diagnosis not present

## 2023-06-09 DIAGNOSIS — Y831 Surgical operation with implant of artificial internal device as the cause of abnormal reaction of the patient, or of later complication, without mention of misadventure at the time of the procedure: Secondary | ICD-10-CM | POA: Diagnosis present

## 2023-06-09 DIAGNOSIS — Z9889 Other specified postprocedural states: Secondary | ICD-10-CM

## 2023-06-09 DIAGNOSIS — I5042 Chronic combined systolic (congestive) and diastolic (congestive) heart failure: Secondary | ICD-10-CM | POA: Diagnosis present

## 2023-06-09 DIAGNOSIS — Z8673 Personal history of transient ischemic attack (TIA), and cerebral infarction without residual deficits: Secondary | ICD-10-CM

## 2023-06-09 LAB — MAGNESIUM: Magnesium: 1.8 mg/dL (ref 1.7–2.4)

## 2023-06-09 LAB — HEPATIC FUNCTION PANEL
ALT: 27 U/L (ref 0–44)
AST: 53 U/L — ABNORMAL HIGH (ref 15–41)
Albumin: 2.9 g/dL — ABNORMAL LOW (ref 3.5–5.0)
Alkaline Phosphatase: 107 U/L (ref 38–126)
Bilirubin, Direct: 0.5 mg/dL — ABNORMAL HIGH (ref 0.0–0.2)
Indirect Bilirubin: 1.3 mg/dL — ABNORMAL HIGH (ref 0.3–0.9)
Total Bilirubin: 1.8 mg/dL — ABNORMAL HIGH (ref 0.0–1.2)
Total Protein: 6.8 g/dL (ref 6.5–8.1)

## 2023-06-09 LAB — CBC WITH DIFFERENTIAL/PLATELET
Abs Immature Granulocytes: 0.57 10*3/uL — ABNORMAL HIGH (ref 0.00–0.07)
Basophils Absolute: 0.2 10*3/uL — ABNORMAL HIGH (ref 0.0–0.1)
Basophils Relative: 0 %
Eosinophils Absolute: 0 10*3/uL (ref 0.0–0.5)
Eosinophils Relative: 0 %
HCT: 31.4 % — ABNORMAL LOW (ref 36.0–46.0)
Hemoglobin: 10.2 g/dL — ABNORMAL LOW (ref 12.0–15.0)
Immature Granulocytes: 1 %
Lymphocytes Relative: 5 %
Lymphs Abs: 2.3 10*3/uL (ref 0.7–4.0)
MCH: 26.7 pg (ref 26.0–34.0)
MCHC: 32.5 g/dL (ref 30.0–36.0)
MCV: 82.2 fL (ref 80.0–100.0)
Monocytes Absolute: 1.4 10*3/uL — ABNORMAL HIGH (ref 0.1–1.0)
Monocytes Relative: 3 %
Neutro Abs: 38.8 10*3/uL — ABNORMAL HIGH (ref 1.7–7.7)
Neutrophils Relative %: 91 %
Platelets: 186 10*3/uL (ref 150–400)
RBC: 3.82 MIL/uL — ABNORMAL LOW (ref 3.87–5.11)
RDW: 15.9 % — ABNORMAL HIGH (ref 11.5–15.5)
Smear Review: NORMAL
WBC: 43.3 10*3/uL — ABNORMAL HIGH (ref 4.0–10.5)
nRBC: 0 % (ref 0.0–0.2)

## 2023-06-09 LAB — RESP PANEL BY RT-PCR (RSV, FLU A&B, COVID)  RVPGX2
Influenza A by PCR: NEGATIVE
Influenza B by PCR: NEGATIVE
Resp Syncytial Virus by PCR: NEGATIVE
SARS Coronavirus 2 by RT PCR: NEGATIVE

## 2023-06-09 LAB — BASIC METABOLIC PANEL
Anion gap: 16 — ABNORMAL HIGH (ref 5–15)
BUN: 17 mg/dL (ref 8–23)
CO2: 20 mmol/L — ABNORMAL LOW (ref 22–32)
Calcium: 8.1 mg/dL — ABNORMAL LOW (ref 8.9–10.3)
Chloride: 97 mmol/L — ABNORMAL LOW (ref 98–111)
Creatinine, Ser: 1.14 mg/dL — ABNORMAL HIGH (ref 0.44–1.00)
GFR, Estimated: 51 mL/min — ABNORMAL LOW (ref >60)
Glucose, Bld: 139 mg/dL — ABNORMAL HIGH (ref 70–99)
Potassium: 2.8 mmol/L — ABNORMAL LOW (ref 3.5–5.1)
Sodium: 133 mmol/L — ABNORMAL LOW (ref 135–145)

## 2023-06-09 LAB — LIPASE, BLOOD: Lipase: 22 U/L (ref 11–51)

## 2023-06-09 LAB — BRAIN NATRIURETIC PEPTIDE: B Natriuretic Peptide: 720.2 pg/mL — ABNORMAL HIGH (ref 0.0–100.0)

## 2023-06-09 LAB — LACTIC ACID, PLASMA
Lactic Acid, Venous: 2.6 mmol/L (ref 0.5–1.9)
Lactic Acid, Venous: 2.4 mmol/L (ref 0.5–1.9)

## 2023-06-09 LAB — PROCALCITONIN: Procalcitonin: 31.85 ng/mL

## 2023-06-09 LAB — TROPONIN I (HIGH SENSITIVITY): Troponin I (High Sensitivity): 283 ng/L (ref <18)

## 2023-06-09 MED ORDER — LACTATED RINGERS IV SOLN: INTRAVENOUS | Status: AC

## 2023-06-09 MED ORDER — LACTATED RINGERS IV BOLUS (SEPSIS)
1000.0000 mL | Freq: Once | INTRAVENOUS | Status: AC
  Administered 2023-06-10: 1000 mL via INTRAVENOUS

## 2023-06-09 MED ORDER — VANCOMYCIN HCL IN DEXTROSE 1-5 GM/200ML-% IV SOLN
1000.0000 mg | Freq: Once | INTRAVENOUS | Status: AC
  Administered 2023-06-09: 1000 mg via INTRAVENOUS
  Filled 2023-06-09: qty 200

## 2023-06-09 MED ORDER — METRONIDAZOLE 500 MG/100ML IV SOLN
500.0000 mg | Freq: Once | INTRAVENOUS | Status: AC
  Administered 2023-06-09: 500 mg via INTRAVENOUS
  Filled 2023-06-09: qty 100

## 2023-06-09 MED ORDER — SODIUM CHLORIDE 0.9 % IV SOLN
2.0000 g | Freq: Once | INTRAVENOUS | Status: AC
  Administered 2023-06-09: 2 g via INTRAVENOUS
  Filled 2023-06-09: qty 12.5

## 2023-06-09 MED ORDER — LACTATED RINGERS IV BOLUS (SEPSIS)
1000.0000 mL | Freq: Once | INTRAVENOUS | Status: AC
  Administered 2023-06-09: 1000 mL via INTRAVENOUS

## 2023-06-09 NOTE — Telephone Encounter (Signed)
See other phone note

## 2023-06-09 NOTE — ED Provider Notes (Signed)
[facility] Provider Note    Event Date/Time   First MD Initiated Contact with Patient 06/09/23 2305     (approximate)   History   Diarrhea   HPI  Tina Peters is a 73 y.o. female with history of CHF, hypertension, atrial fibrillation on anticoagulation, stroke who presents to the emergency department with 6 days of nausea, vomiting and diarrhea.  Complains of diffuse chest tightness and feeling like a band is around her chest and abdomen.  Complaining of diffuse chest pain.  Denies dysuria, hematuria, vaginal bleeding or discharge.  She has had previous bilateral salpingo-oophorectomy.  Patient has scabbed lesions to her nose and upper lip.  States whenever she gets sick she gets "cold sores".  No rash anywhere else.   History provided by patient, family.    Past Medical History:  Diagnosis Date   Allergy    Anxiety    Arthritis    Asthma    Chronic back pain    Chronic combined systolic (congestive) and diastolic (congestive) heart failure (HCC)    a. 02/2020 Echo: EF 60-65%, no rwma. Gr2 DD. Nl RV size/fxn. Mild LAE. Mild MR; b. 10/2021 Echo: [facility] following MVR) EF 40 to 45%.   Depression    Diverticulitis    Endocarditis    a. 10/2021 S sanguinous bacteremia w/ MV veg-->30mm Mitris bioprosthetic valve, Maze, and LAA clip.   Hyperlipidemia    Hypertension    Lipoma    Mild mitral regurgitation    a. 08/2016 Echo: mild MR; b. 02/2020 Echo: Mild MR.   Neuromuscular disorder (HCC)    Obesity    Obstructive sleep apnea    PAF (paroxysmal atrial fibrillation) (HCC)    a. Dx 05/2016--> Xarelto (CHA2DS2VASc = 3); b. 08/2016 Echo: EF 55-60%, no rwma, mild MR, mod dil LA; c. 03/2020 Zio: Avg HR 61 w/ Afib burden of 24% - max rate 189 (avg 93). 2 pauses - longest 4.7 secs post-conversion pauses (occurred @ 5:42 am); d. 04/2022 s/p DCCV; e. 08/2022 s/p DCCV.   Paroxysmal SVT (supraventricular tachycardia) (HCC)    S/P mitral valve replacement    a. 10/2021  [facility] - Endocarditis/MV Veg-->63mm Mitris bioprosthetic valve, Maze, and LAA clip.   Sleep apnea    Stroke [facility])    a. 02/2020 MRI/A: multifocal acute ischemia in R MCA territory predominantly involving post insula and R middle frontal gyrus. Nl MRA.   Tachy-brady syndrome (HCC)    Tobacco abuse     Past Surgical History:  Procedure Laterality Date   ACHILLES TENDON SURGERY Left    BACK SURGERY     2 ruptured discs in L spine, no hardware   CARDIOVERSION N/A 04/22/2022   Procedure: CARDIOVERSION;  Surgeon: Iran Ouch, MD;  Location: [facility] ORS;  Service: Cardiovascular;  Laterality: N/A;   CARDIOVERSION N/A 08/28/2022   Procedure: CARDIOVERSION;  Surgeon: Debbe Odea, MD;  Location: [facility] ORS;  Service: Cardiovascular;  Laterality: N/A;   KNEE ARTHROSCOPY W/ MENISCAL REPAIR Right    in the 1990s   SALPINGOOPHORECTOMY Bilateral    TUMOR REMOVAL     neck, ?lipoma   VALVE REPLACEMENT  10/04/2021    MEDICATIONS:  Prior to Admission medications   Medication Sig Start Date End Date Taking? Authorizing Provider  acetaminophen (TYLENOL) 325 MG tablet Take by mouth. 4 tablets as needed    [provider]  albuterol (VENTOLIN HFA) 108 (90 Base) MCG/ACT inhaler Inhale 2 puffs into the lungs every  6 (six) hours as needed for wheezing or shortness of breath. 08/17/21   Erasmo Downer, MD  amiodarone (PACERONE) 200 MG tablet Take 1 tablet (200 mg total) by mouth daily. Overdue follow up visit.  PLEASE CALL OFFICE TO SCHEDULE APPOINTMENT PRIOR TO NEXT REFILL 04/17/23   Sherie Don, NP  atorvastatin (LIPITOR) 40 MG tablet TAKE 1 TABLET BY MOUTH EVERY DAY 12/09/22   Erasmo Downer, MD  hydrocortisone 2.5 % cream Apply 1 application topically as needed. 03/10/20   [provider]  losartan (COZAAR) 25 MG tablet Take 1 tablet (25 mg total) by mouth daily. 07/26/22   Creig Hines, NP  methocarbamol (ROBAXIN) 500 MG tablet TAKE 1 TABLET BY MOUTH EVERY 8  HOURS AS NEEDED FOR MUSCLE SPASMS. 04/16/23   Erasmo Downer, MD  metoprolol succinate (TOPROL-XL) 50 MG 24 hr tablet TAKE 1 TABLET BY MOUTH EVERY DAY 03/10/23   Iran Ouch, MD  ondansetron (ZOFRAN-ODT) 4 MG disintegrating tablet Take 1 tablet (4 mg total) by mouth every 8 (eight) hours as needed for nausea or vomiting. 06/03/23   Danelle Berry, PA-C  rivaroxaban (XARELTO) 20 MG TABS tablet TAKE 1 TABLET (20 MG TOTAL) BY MOUTH DAILY WITH SUPPER. TAKE WITH DINNER OR LARGEST MEAL OF THE DAY 04/16/23   Erasmo Downer, MD  sertraline (ZOLOFT) 50 MG tablet TAKE 1 TABLET BY MOUTH EVERY DAY 04/16/23   Erasmo Downer, MD  spironolactone (ALDACTONE) 25 MG tablet Take 0.5 tablets by mouth daily.    [provider]    Physical Exam   Triage Vital Signs: ED Triage Vitals  Encounter Vitals Group     BP 06/09/23 2135 (!) 96/54     Systolic BP Percentile --      Diastolic BP Percentile --      Pulse Rate 06/09/23 2134 75     Resp 06/09/23 2134 (!) 21     Temp 06/09/23 2134 98.3 F (36.8 C)     Temp Source 06/09/23 2134 Oral     SpO2 06/09/23 2134 99 %     Weight 06/09/23 2135 224 lb 13.9 oz (102 kg)     Height 06/09/23 2135 5\' 5"  (1.651 m)     Head Circumference --      Peak Flow --      Pain Score 06/09/23 2135 7     Pain Loc --      Pain Education --      Exclude from Growth Chart --     Most recent vital signs: Vitals:   06/10/23 0245 06/10/23 0300  BP:  130/71  Pulse: 98 (!) 103  Resp:    Temp:    SpO2: 100% 100%    CONSTITUTIONAL: Alert, responds appropriately to questions. Well-appearing; well-nourished HEAD: Normocephalic, atraumatic EYES: Conjunctivae clear, pupils appear equal, sclera nonicteric ENT: normal nose; moist mucous membranes, scabbed lesions to her nose and upper lip NECK: Supple, normal ROM CARD: RRR; S1 and S2 appreciated RESP: Normal chest excursion without splinting or tachypnea; breath sounds clear and equal bilaterally; no  wheezes, no rhonchi, no rales, no hypoxia or respiratory distress, speaking full sentences ABD/GI: Non-distended; soft, non-tender, no rebound, no guarding, no peritoneal signs BACK: The back appears normal EXT: Normal ROM in all joints; no deformity noted, no edema SKIN: Normal color for age and race; warm; no rash on exposed skin NEURO: Moves all extremities equally, normal speech PSYCH: The patient's mood and manner are appropriate.   ED  Results / Procedures / Treatments   LABS: (all labs ordered are listed, but only abnormal results are displayed) Labs Reviewed  BASIC METABOLIC PANEL - Abnormal; Notable for the following components:      Result Value   Sodium 133 (*)    Potassium 2.8 (*)    Chloride 97 (*)    CO2 20 (*)    Glucose, Bld 139 (*)    Creatinine, Ser 1.14 (*)    Calcium 8.1 (*)    GFR, Estimated 51 (*)    Anion gap 16 (*)    All other components within normal limits  CBC WITH DIFFERENTIAL/PLATELET - Abnormal; Notable for the following components:   WBC 43.3 (*)    RBC 3.82 (*)    Hemoglobin 10.2 (*)    HCT 31.4 (*)    RDW 15.9 (*)    Neutro Abs 38.8 (*)    Monocytes Absolute 1.4 (*)    Basophils Absolute 0.2 (*)    Abs Immature Granulocytes 0.57 (*)    All other components within normal limits  LACTIC ACID, PLASMA - Abnormal; Notable for the following components:   Lactic Acid, Venous 2.6 (*)    All other components within normal limits  LACTIC ACID, PLASMA - Abnormal; Notable for the following components:   Lactic Acid, Venous 2.4 (*)    All other components within normal limits  HEPATIC FUNCTION PANEL - Abnormal; Notable for the following components:   Albumin 2.9 (*)    AST 53 (*)    Total Bilirubin 1.8 (*)    Bilirubin, Direct 0.5 (*)    Indirect Bilirubin 1.3 (*)    All other components within normal limits  BRAIN NATRIURETIC PEPTIDE - Abnormal; Notable for the following components:   B Natriuretic Peptide 720.2 (*)    All other components  within normal limits  TROPONIN I (HIGH SENSITIVITY) - Abnormal; Notable for the following components:   Troponin I (High Sensitivity) 283 (*)    All other components within normal limits  TROPONIN I (HIGH SENSITIVITY) - Abnormal; Notable for the following components:   Troponin I (High Sensitivity) 331 (*)    All other components within normal limits  RESP PANEL BY RT-PCR (RSV, FLU A&B, COVID)  RVPGX2  CULTURE, BLOOD (SINGLE)  CULTURE, BLOOD (SINGLE)  GASTROINTESTINAL PANEL BY PCR, STOOL (REPLACES STOOL CULTURE)  C DIFFICILE QUICK SCREEN W PCR REFLEX    LIPASE, BLOOD  MAGNESIUM  PROCALCITONIN  LACTIC ACID, PLASMA  LACTIC ACID, PLASMA  URINALYSIS, ROUTINE W REFLEX MICROSCOPIC  APTT  PROTIME-INR  HEPARIN LEVEL (UNFRACTIONATED)     EKG:  EKG Interpretation Date/Time:  Monday June 09 2023 23:54:05 EST Ventricular Rate:  55 PR Interval:  178 QRS Duration:  80 QT Interval:  474 QTC Calculation: 453 R Axis:   45  Text Interpretation: Sinus bradycardia Nonspecific ST and T wave abnormality Abnormal ECG When compared with ECG of 09-Jun-2023 21:44, Current undetermined rhythm precludes rhythm comparison, needs review Nonspecific T wave abnormality, worse in Inferior leads QT has shortened Confirmed by Rochele Raring [phone]) on 06/10/2023 12:02:38 AM         RADIOLOGY: My personal review and interpretation of imaging: Gallstones.  No cholecystitis.  I have personally reviewed all radiology reports.   US ABDOMEN LIMITED RUQ (LIVER/GB) Result Date: 06/10/2023 CLINICAL DATA:  [id number]. Gallstones on CT today. Patient undergoing workup for sepsis. EXAM: ULTRASOUND ABDOMEN LIMITED RIGHT UPPER QUADRANT COMPARISON:  CT earlier today. FINDINGS: Gallbladder: There is slight gallbladder dilatation.  Multiple layering subcentimeter stones are noted in the proximal lumen intermixed with sludge. There is no free wall thickening or positive sonographic Murphy's sign, no pericholecystic fluid.  Common bile duct: Diameter: 1.4 mm with no intrahepatic bile duct dilatation. Liver: No focal lesion identified. There is mild increased hepatic echogenicity consistent with steatosis. Portal vein is patent on color Doppler imaging with normal direction of blood flow towards the liver. Other: None. IMPRESSION: 1. Cholelithiasis and gallbladder sludge with slight gallbladder dilatation. No free wall thickening or positive sonographic Murphy's sign. 2. No biliary ductal dilatation. 3. Mild hepatic steatosis. Electronically Signed   By: Almira Bar M.D.   On: 06/10/2023 03:02   CT CHEST ABDOMEN PELVIS W CONTRAST Result Date: 06/10/2023 CLINICAL DATA:  Sepsis EXAM: CT CHEST, ABDOMEN, AND PELVIS WITH CONTRAST TECHNIQUE: Multidetector CT imaging of the chest, abdomen and pelvis was performed following the standard protocol during bolus administration of intravenous contrast. RADIATION DOSE REDUCTION: This exam was performed according to the departmental dose-optimization program which includes automated exposure control, adjustment of the mA and/or kV according to patient size and/or use of iterative reconstruction technique. CONTRAST:  OMNIPAQUE IOHEXOL 300 MG/ML  SOLN COMPARISON:  None Available. FINDINGS: CT CHEST FINDINGS Cardiovascular: Mild cardiomegaly. Aortic valve replacement. Mild calcific aortic atherosclerosis. No pericardial effusion. Mediastinum/Nodes: No enlarged mediastinal, hilar, or axillary lymph nodes. Thyroid gland, trachea, and esophagus demonstrate no significant findings. Lungs/Pleura: Mild left basilar atelectasis. Otherwise unremarkable. Musculoskeletal: No chest wall mass or suspicious bone lesions identified. CT ABDOMEN PELVIS FINDINGS Hepatobiliary: Normal hepatic contours. No intra- or extrahepatic biliary dilatation. There is cholelithiasis without acute inflammation. Pancreas: Normal pancreas. No ductal dilatation or peripancreatic fluid collection. Spleen: Normal.  Adrenals/Urinary Tract: The adrenal glands are normal. No hydronephrosis, nephroureterolithiasis or solid renal mass. The urinary bladder is normal for degree of distention Stomach/Bowel: There is no hiatal hernia. Normal duodenal course and caliber. No small bowel dilatation or inflammation. Rectosigmoid diverticulosis without acute inflammation. Normal appendix. Vascular/Lymphatic: There is calcific atherosclerosis of the abdominal aorta. No lymphadenopathy. Reproductive: Normal prostate size with symmetric seminal vesicles. Other: None. Musculoskeletal: No bony spinal canal stenosis or focal osseous abnormality. IMPRESSION: 1. No acute abnormality of the chest, abdomen or pelvis. 2. Cholelithiasis without acute inflammation 3. Rectosigmoid diverticulosis without acute inflammation. Electronically Signed   By: Deatra Robinson M.D.   On: 06/10/2023 00:54     PROCEDURES:  Critical Care performed: Yes, see critical care procedure note(s)   CRITICAL CARE Performed by: Baxter Hire Tonianne Fine   Total critical care time: 45 minutes  Critical care time was exclusive of separately billable procedures and treating other patients.  Critical care was necessary to treat or prevent imminent or life-threatening deterioration.  Critical care was time spent personally by me on the following activities: development of treatment plan with patient and/or surrogate as well as nursing, discussions with consultants, evaluation of patient's response to treatment, examination of patient, obtaining history from patient or surrogate, ordering and performing treatments and interventions, ordering and review of laboratory studies, ordering and review of radiographic studies, pulse oximetry and re-evaluation of patient's condition.   Marland Kitchen1-3 Lead EKG Interpretation  Performed by: Armonte Tortorella, Layla Maw, DO Authorized by: Areona Homer, Layla Maw, DO     Interpretation: abnormal     ECG rate:  103   ECG rate assessment: tachycardic     Rhythm:  sinus tachycardia     Ectopy: none     Conduction: normal       IMPRESSION / MDM /  ASSESSMENT AND PLAN / ED COURSE  I reviewed the triage vital signs and the nursing notes.    Patient here for chest pain, abdominal pain, vomiting and diarrhea.  The patient is on the cardiac monitor to evaluate for evidence of arrhythmia and/or significant heart rate changes.   DIFFERENTIAL DIAGNOSIS (includes but not limited to):   Sepsis, viral gastroenteritis, dehydration, colitis, diverticulitis, bowel obstruction, UTI, ACS, PE, pneumonia   Patient's presentation is most consistent with acute presentation with potential threat to life or bodily function.   PLAN: Workup initiated from triage.  Patient has a leukocytosis of 43,000.  No known history of hematologic cancer.  Patient also is mildly hypokalemic without EKG changes.  Will give oral potassium.  Will check magnesium level.  Creatinine slightly elevated which is her baseline.  Troponin elevated that could be secondary to NSTEMI given she is having chest pain but also could be related to demand ischemia from sepsis.  Second troponin pending.  EKG shows no new ischemic change.  Will obtain CT of the abdomen pelvis, chest.  Will obtain urine, blood cultures, procalcitonin.  Will give IV fluids and broad-spectrum antibiotics.  Will give pain and nausea medicine.   MEDICATIONS GIVEN IN ED: Medications  lactated ringers infusion (has no administration in time range)  lactated ringers bolus 1,000 mL (has no administration in time range)  heparin ADULT infusion 100 units/mL (25000 units/237mL) (has no administration in time range)  potassium chloride SA (KLOR-CON M) CR tablet 40 mEq (has no administration in time range)  lactated ringers bolus 1,000 mL (0 mLs Intravenous Stopped 06/10/23 0009)  ceFEPIme (MAXIPIME) 2 g in sodium chloride 0.9 % 100 mL IVPB (0 g Intravenous Stopped 06/09/23 2324)  metroNIDAZOLE (FLAGYL) IVPB 500 mg (0 mg Intravenous  Stopped 06/10/23 0035)  vancomycin (VANCOCIN) IVPB 1000 mg/200 mL premix (0 mg Intravenous Stopped 06/10/23 0035)  iohexol (OMNIPAQUE) 300 MG/ML solution 100 mL (100 mLs Intravenous Contrast Given 06/10/23 0029)  fentaNYL (SUBLIMAZE) injection 50 mcg (50 mcg Intravenous Given 06/10/23 0107)  ondansetron (ZOFRAN) injection 4 mg (4 mg Intravenous Given 06/10/23 0107)  aspirin chewable tablet 324 mg (324 mg Oral Given 06/10/23 0339)     ED COURSE: CT scans reviewed and interpreted by myself and the radiologist.  They show cholelithiasis without cholecystitis.  Diverticulosis without diverticulitis.  Will obtain upper abdominal ultrasound.  Procalcitonin is 31.  Second troponin still elevated and rising.  Lactics elevated but downtrending.  Will continue hydration.   Ultrasound reviewed and interpreted by myself and the radiologist and shows again gallstones without cholecystitis or cholangitis or choledocholithiasis.   Blood pressure is improving with IV hydration.   Will start aspirin, heparin for elevated cardiac enzymes.   Will discuss with hospitalist for admission.   CONSULTS:  Consulted and discussed patient's case with hospitalist, Dr. Para March.  I have recommended admission and consulting physician agrees and will place admission orders.  Patient (and family if present) agree with this plan.   I reviewed all nursing notes, vitals, pertinent previous records.  All labs, EKGs, imaging ordered have been independently reviewed and interpreted by myself.    OUTSIDE RECORDS REVIEWED: Reviewed last PCP note on 06/03/2023.       FINAL CLINICAL IMPRESSION(S) / ED DIAGNOSES   Final diagnoses:  Nausea vomiting and diarrhea  NSTEMI (non-ST elevated myocardial infarction) (HCC)  Gallstones  Hypokalemia  Acute sepsis (HCC)     Rx / DC Orders   ED Discharge Orders  None        Note:  This document was prepared using Dragon voice recognition software and may include unintentional  dictation errors.   Zenda Herskowitz, Layla Maw, DO 06/10/23 [phone]

## 2023-06-09 NOTE — Telephone Encounter (Signed)
Should be seen tomorrow then if not going to ED.  We can eval her and determine next step. Will not Rx Z pack before evaluating.

## 2023-06-09 NOTE — Telephone Encounter (Signed)
2nd Request--PEC stated that patient has refused ER & wants Dr. Beryle Flock to send over a prescription for a ZPAK.Marland KitchenMarland KitchenMarland KitchenPlease advise     June 09, 2023 Karma Lew, RN to [facility]      06/09/23  3:01 PM  Refused ED/911 Karma Lew, RN     06/09/23  3:01 PM Note   Chief Complaint: muscle aches, vomiting, diarrhea, soreness to chest from cough Symptoms: chills, dizziness, dry mouth, headache Frequency: over 1 week   Disposition: [] ED /[] Urgent Care (no appt availability in office) / [] Appointment(In office/virtual)/ []  [place] [facility]/ [] Home Care/ [x] Refused Recommended Disposition /[] [place] [facility]/ []  Follow-up with PCP Additional Notes: routing to provider discussed going to ED- pt stated she feels dehydrated. Hesitant to go. Advised pt she sounds very ill and not to wait until she is worse. Advised she she does sound dehydrated and weak. Pt stated she will discuss with her daughter.  Reason for Disposition  Patient sounds very sick or weak to the triager  Answer Assessment - Initial Assessment Questions 1. WORST SYMPTOM: "What is your worst symptom?" (e.g., cough, runny nose, muscle aches, headache, sore throat, fever)      Diarrhea, muscle aches, cough   2. ONSET: "When did your flu symptoms start?"      Over a week 3. COUGH: "How bad is the cough?"       Coughing spells cause vomiting wheezing using Albuterol 4. RESPIRATORY DISTRESS: "Describe your breathing."      Denied SOB 5. FEVER: "Do you have a fever?" If Yes, ask: "What is your temperature, how was it measured, and when did it start?"     Unknown  8. HIGH RISK DISEASE: "Do you have any chronic medical problems?" (e.g., heart or lung disease, asthma, weak immune system, or other HIGH RISK conditions)     Heart disease 10. OTHER SYMPTOMS: "Do you have any other symptoms?"  (e.g., runny nose, muscle aches, headache, sore throat)       Diarrhea, feels ill, vomiting,  severe, dizziness, dry mouth , last urinated before call, stated she feels dehydrated  Protocols used: Influenza (Flu) - [facility]

## 2023-06-09 NOTE — Progress Notes (Signed)
CODE SEPSIS - PHARMACY COMMUNICATION  **Broad Spectrum Antibiotics should be administered within 1 hour of Sepsis diagnosis**  Time Code Sepsis Called/Page Received: 2320  Antibiotics Ordered: Cefepime, Flagyl, Vancomycin  Time of 1st antibiotic administration: 2305  Otelia Sergeant, PharmD, [facility] 06/09/2023 11:24 PM

## 2023-06-09 NOTE — ED Provider Triage Note (Signed)
Emergency Medicine Provider Triage Evaluation Note  Tina Peters , a 73 y.o. female  was evaluated in triage.  Pt complains of history of 7 days with nausea vomit diarrhea fever.  Patient came to ED and on Sunday they diagnosed a virus respiratory panel was negative patient went to PCP on Tuesday and they ran more labs and everything was normal.  Review of Systems  Positive:  Negative:   Physical Exam  BP (!) 96/54   Pulse 75   Temp 98.3 F (36.8 C) (Oral)   Resp (!) 21   Ht 5\' 5"  (1.651 m)   Wt 102 kg   SpO2 99%   BMI 37.42 kg/m  Gen:   Awake, no distress , dehydrated Resp:  Normal effort, regular rhythm MSK:   Moves extremities without difficulty  Other:    Medical Decision Making  Medically screening exam initiated at 9:48 PM.  Appropriate orders placed.  [name] was informed that the remainder of the evaluation will be completed by another provider, this initial triage assessment does not replace that evaluation, and the importance of remaining in the ED until their evaluation is complete.  Patient with nauseous vomit diarrhea ordered CBC CMP UA   Gladys Damme, PA-C 06/09/23 2149

## 2023-06-09 NOTE — Telephone Encounter (Addendum)
  Chief Complaint: muscle aches, vomiting, diarrhea, soreness to chest from cough Symptoms: chills, dizziness, dry mouth, headache Frequency: over 1 week  Disposition: [] ED /[] Urgent Care (no appt availability in office) / [] Appointment(In office/virtual)/ []  [place] [facility]/ [] Home Care/ [x] Refused Recommended Disposition /[] [place] [facility]/ []  Follow-up with PCP Additional Notes: routing to provider discussed going to ED- pt stated she feels dehydrated. Hesitant to go. Advised pt she sounds very ill and not to wait until she is worse. Advised she she does sound dehydrated and weak. Pt stated she will discuss with her daughter. Stated she was negative for Flu. Called clinic to advise PCP pt request for Z pak. Susa Day who stated she will let PCP know. Reason for Disposition  Patient sounds very sick or weak to the triager  Answer Assessment - Initial Assessment Questions 1. WORST SYMPTOM: "What is your worst symptom?" (e.g., cough, runny nose, muscle aches, headache, sore throat, fever)      Diarrhea, muscle aches, cough   2. ONSET: "When did your flu symptoms start?"      Over a week 3. COUGH: "How bad is the cough?"       Coughing spells cause vomiting wheezing using Albuterol 4. RESPIRATORY DISTRESS: "Describe your breathing."      Denied SOB 5. FEVER: "Do you have a fever?" If Yes, ask: "What is your temperature, how was it measured, and when did it start?"     Unknown  8. HIGH RISK DISEASE: "Do you have any chronic medical problems?" (e.g., heart or lung disease, asthma, weak immune system, or other HIGH RISK conditions)     Heart disease 10. OTHER SYMPTOMS: "Do you have any other symptoms?"  (e.g., runny nose, muscle aches, headache, sore throat)       Diarrhea, feels ill, vomiting, severe, dizziness, dry mouth , last urinated before call, stated she feels dehydrated  Protocols used: Influenza (Flu) - [facility]

## 2023-06-09 NOTE — ED Triage Notes (Signed)
Pt arrives via POV with CC of diarrhea x6 days and vomiting. Reports feeling of heart fluttering and pain that started x2 days ago. Pt agitated and yelling/smacking hands at this RN.

## 2023-06-10 ENCOUNTER — Ambulatory Visit: Payer: Medicare HMO

## 2023-06-10 ENCOUNTER — Inpatient Hospital Stay (HOSPITAL_COMMUNITY)
Admit: 2023-06-10 | Discharge: 2023-06-10 | Disposition: A | Discharge status: Home / Self Care | Payer: Medicare HMO | Attending: Internal Medicine | Admitting: Internal Medicine

## 2023-06-10 ENCOUNTER — Other Ambulatory Visit: Payer: Medicare HMO

## 2023-06-10 ENCOUNTER — Telehealth: Payer: Self-pay

## 2023-06-10 ENCOUNTER — Emergency Department: Payer: Medicare HMO

## 2023-06-10 DIAGNOSIS — A419 Sepsis, unspecified organism: Secondary | ICD-10-CM | POA: Diagnosis not present

## 2023-06-10 DIAGNOSIS — Z9889 Other specified postprocedural states: Secondary | ICD-10-CM | POA: Diagnosis not present

## 2023-06-10 DIAGNOSIS — I130 Hypertensive heart and chronic kidney disease with heart failure and stage 1 through stage 4 chronic kidney disease, or unspecified chronic kidney disease: Secondary | ICD-10-CM | POA: Diagnosis not present

## 2023-06-10 DIAGNOSIS — M47816 Spondylosis without myelopathy or radiculopathy, lumbar region: Secondary | ICD-10-CM | POA: Diagnosis not present

## 2023-06-10 DIAGNOSIS — E860 Dehydration: Secondary | ICD-10-CM | POA: Diagnosis not present

## 2023-06-10 DIAGNOSIS — I214 Non-ST elevation (NSTEMI) myocardial infarction: Secondary | ICD-10-CM | POA: Diagnosis not present

## 2023-06-10 DIAGNOSIS — N1831 Chronic kidney disease, stage 3a: Secondary | ICD-10-CM

## 2023-06-10 DIAGNOSIS — R079 Chest pain, unspecified: Secondary | ICD-10-CM

## 2023-06-10 DIAGNOSIS — I4892 Unspecified atrial flutter: Secondary | ICD-10-CM | POA: Diagnosis not present

## 2023-06-10 DIAGNOSIS — K760 Fatty (change of) liver, not elsewhere classified: Secondary | ICD-10-CM | POA: Diagnosis not present

## 2023-06-10 DIAGNOSIS — R7989 Other specified abnormal findings of blood chemistry: Secondary | ICD-10-CM | POA: Diagnosis not present

## 2023-06-10 DIAGNOSIS — M549 Dorsalgia, unspecified: Secondary | ICD-10-CM | POA: Diagnosis not present

## 2023-06-10 DIAGNOSIS — K8020 Calculus of gallbladder without cholecystitis without obstruction: Secondary | ICD-10-CM | POA: Diagnosis not present

## 2023-06-10 DIAGNOSIS — K828 Other specified diseases of gallbladder: Secondary | ICD-10-CM | POA: Diagnosis not present

## 2023-06-10 DIAGNOSIS — I2489 Other forms of acute ischemic heart disease: Secondary | ICD-10-CM | POA: Diagnosis present

## 2023-06-10 DIAGNOSIS — Z1152 Encounter for screening for COVID-19: Secondary | ICD-10-CM | POA: Diagnosis not present

## 2023-06-10 DIAGNOSIS — G4733 Obstructive sleep apnea (adult) (pediatric): Secondary | ICD-10-CM | POA: Diagnosis not present

## 2023-06-10 DIAGNOSIS — I5020 Unspecified systolic (congestive) heart failure: Secondary | ICD-10-CM | POA: Diagnosis not present

## 2023-06-10 DIAGNOSIS — I4891 Unspecified atrial fibrillation: Secondary | ICD-10-CM | POA: Diagnosis not present

## 2023-06-10 DIAGNOSIS — K838 Other specified diseases of biliary tract: Secondary | ICD-10-CM | POA: Diagnosis not present

## 2023-06-10 DIAGNOSIS — I471 Supraventricular tachycardia, unspecified: Secondary | ICD-10-CM | POA: Diagnosis not present

## 2023-06-10 DIAGNOSIS — K529 Noninfective gastroenteritis and colitis, unspecified: Secondary | ICD-10-CM | POA: Diagnosis not present

## 2023-06-10 DIAGNOSIS — M48061 Spinal stenosis, lumbar region without neurogenic claudication: Secondary | ICD-10-CM | POA: Diagnosis not present

## 2023-06-10 DIAGNOSIS — E66813 Obesity, class 3: Secondary | ICD-10-CM | POA: Diagnosis not present

## 2023-06-10 DIAGNOSIS — Z7901 Long term (current) use of anticoagulants: Secondary | ICD-10-CM | POA: Diagnosis not present

## 2023-06-10 DIAGNOSIS — M4316 Spondylolisthesis, lumbar region: Secondary | ICD-10-CM | POA: Diagnosis not present

## 2023-06-10 DIAGNOSIS — Z6841 Body Mass Index (BMI) 40.0 and over, adult: Secondary | ICD-10-CM | POA: Diagnosis not present

## 2023-06-10 DIAGNOSIS — R112 Nausea with vomiting, unspecified: Secondary | ICD-10-CM | POA: Diagnosis not present

## 2023-06-10 DIAGNOSIS — B9689 Other specified bacterial agents as the cause of diseases classified elsewhere: Secondary | ICD-10-CM | POA: Diagnosis not present

## 2023-06-10 DIAGNOSIS — N179 Acute kidney failure, unspecified: Secondary | ICD-10-CM | POA: Diagnosis not present

## 2023-06-10 DIAGNOSIS — I342 Nonrheumatic mitral (valve) stenosis: Secondary | ICD-10-CM | POA: Diagnosis not present

## 2023-06-10 DIAGNOSIS — G319 Degenerative disease of nervous system, unspecified: Secondary | ICD-10-CM | POA: Diagnosis not present

## 2023-06-10 DIAGNOSIS — I480 Paroxysmal atrial fibrillation: Secondary | ICD-10-CM | POA: Diagnosis not present

## 2023-06-10 DIAGNOSIS — R6520 Severe sepsis without septic shock: Secondary | ICD-10-CM | POA: Diagnosis not present

## 2023-06-10 DIAGNOSIS — Y831 Surgical operation with implant of artificial internal device as the cause of abnormal reaction of the patient, or of later complication, without mention of misadventure at the time of the procedure: Secondary | ICD-10-CM | POA: Diagnosis not present

## 2023-06-10 DIAGNOSIS — E872 Acidosis, unspecified: Secondary | ICD-10-CM | POA: Diagnosis not present

## 2023-06-10 DIAGNOSIS — D649 Anemia, unspecified: Secondary | ICD-10-CM | POA: Diagnosis not present

## 2023-06-10 DIAGNOSIS — R7881 Bacteremia: Secondary | ICD-10-CM | POA: Diagnosis not present

## 2023-06-10 DIAGNOSIS — T826XXA Infection and inflammatory reaction due to cardiac valve prosthesis, initial encounter: Secondary | ICD-10-CM | POA: Diagnosis not present

## 2023-06-10 DIAGNOSIS — I5042 Chronic combined systolic (congestive) and diastolic (congestive) heart failure: Secondary | ICD-10-CM | POA: Diagnosis not present

## 2023-06-10 DIAGNOSIS — M47814 Spondylosis without myelopathy or radiculopathy, thoracic region: Secondary | ICD-10-CM | POA: Diagnosis not present

## 2023-06-10 DIAGNOSIS — Z452 Encounter for adjustment and management of vascular access device: Secondary | ICD-10-CM | POA: Diagnosis not present

## 2023-06-10 DIAGNOSIS — E876 Hypokalemia: Secondary | ICD-10-CM

## 2023-06-10 DIAGNOSIS — K5790 Diverticulosis of intestine, part unspecified, without perforation or abscess without bleeding: Secondary | ICD-10-CM | POA: Diagnosis not present

## 2023-06-10 DIAGNOSIS — I517 Cardiomegaly: Secondary | ICD-10-CM | POA: Diagnosis not present

## 2023-06-10 DIAGNOSIS — E871 Hypo-osmolality and hyponatremia: Secondary | ICD-10-CM | POA: Diagnosis not present

## 2023-06-10 DIAGNOSIS — I6529 Occlusion and stenosis of unspecified carotid artery: Secondary | ICD-10-CM | POA: Diagnosis not present

## 2023-06-10 DIAGNOSIS — M4804 Spinal stenosis, thoracic region: Secondary | ICD-10-CM | POA: Diagnosis not present

## 2023-06-10 DIAGNOSIS — E785 Hyperlipidemia, unspecified: Secondary | ICD-10-CM | POA: Diagnosis not present

## 2023-06-10 DIAGNOSIS — I059 Rheumatic mitral valve disease, unspecified: Secondary | ICD-10-CM | POA: Diagnosis not present

## 2023-06-10 DIAGNOSIS — E66812 Obesity, class 2: Secondary | ICD-10-CM | POA: Insufficient documentation

## 2023-06-10 DIAGNOSIS — I482 Chronic atrial fibrillation, unspecified: Secondary | ICD-10-CM | POA: Diagnosis not present

## 2023-06-10 DIAGNOSIS — R0989 Other specified symptoms and signs involving the circulatory and respiratory systems: Secondary | ICD-10-CM | POA: Diagnosis not present

## 2023-06-10 DIAGNOSIS — I38 Endocarditis, valve unspecified: Secondary | ICD-10-CM | POA: Diagnosis not present

## 2023-06-10 DIAGNOSIS — I330 Acute and subacute infective endocarditis: Secondary | ICD-10-CM | POA: Diagnosis not present

## 2023-06-10 LAB — URINALYSIS, ROUTINE W REFLEX MICROSCOPIC
Bilirubin Urine: NEGATIVE
Glucose, UA: NEGATIVE mg/dL
Ketones, ur: NEGATIVE mg/dL
Leukocytes,Ua: NEGATIVE
Nitrite: NEGATIVE
Protein, ur: NEGATIVE mg/dL
RBC / HPF: 0 RBC/hpf (ref 0–5)
Specific Gravity, Urine: 1.028 (ref 1.005–1.030)
WBC, UA: 0 WBC/hpf (ref 0–5)
pH: 6 (ref 5.0–8.0)

## 2023-06-10 LAB — GASTROINTESTINAL PANEL BY PCR, STOOL (REPLACES STOOL CULTURE)

## 2023-06-10 LAB — CBC WITH DIFFERENTIAL/PLATELET
Abs Immature Granulocytes: 0.19 10*3/uL — ABNORMAL HIGH (ref 0.00–0.07)
Basophils Absolute: 0.1 10*3/uL (ref 0.0–0.1)
Basophils Relative: 0 %
Eosinophils Absolute: 0 10*3/uL (ref 0.0–0.5)
Eosinophils Relative: 0 %
HCT: 29.7 % — ABNORMAL LOW (ref 36.0–46.0)
Hemoglobin: 9.4 g/dL — ABNORMAL LOW (ref 12.0–15.0)
Immature Granulocytes: 1 %
Lymphocytes Relative: 5 %
Lymphs Abs: 1.1 10*3/uL (ref 0.7–4.0)
MCH: 26.9 pg (ref 26.0–34.0)
MCHC: 31.6 g/dL (ref 30.0–36.0)
MCV: 84.9 fL (ref 80.0–100.0)
Monocytes Absolute: 0.8 10*3/uL (ref 0.1–1.0)
Monocytes Relative: 3 %
Neutro Abs: 20.1 10*3/uL — ABNORMAL HIGH (ref 1.7–7.7)
Neutrophils Relative %: 91 %
Platelets: 178 10*3/uL (ref 150–400)
RBC: 3.5 MIL/uL — ABNORMAL LOW (ref 3.87–5.11)
RDW: 15.9 % — ABNORMAL HIGH (ref 11.5–15.5)
WBC: 22.2 10*3/uL — ABNORMAL HIGH (ref 4.0–10.5)
nRBC: 0 % (ref 0.0–0.2)

## 2023-06-10 LAB — APTT
aPTT: 37 s — ABNORMAL HIGH (ref 24–36)
aPTT: 39 s — ABNORMAL HIGH (ref 24–36)
aPTT: 54 s — ABNORMAL HIGH (ref 24–36)

## 2023-06-10 LAB — LACTIC ACID, PLASMA
Lactic Acid, Venous: 1.7 mmol/L (ref 0.5–1.9)
Lactic Acid, Venous: 1.5 mmol/L (ref 0.5–1.9)

## 2023-06-10 LAB — MAGNESIUM: Magnesium: 2.3 mg/dL (ref 1.7–2.4)

## 2023-06-10 LAB — PROTIME-INR
INR: 1.6 — ABNORMAL HIGH (ref 0.8–1.2)
Prothrombin Time: 18.9 s — ABNORMAL HIGH (ref 11.4–15.2)

## 2023-06-10 LAB — BASIC METABOLIC PANEL
Anion gap: 10 (ref 5–15)
BUN: 15 mg/dL (ref 8–23)
CO2: 24 mmol/L (ref 22–32)
Calcium: 7.9 mg/dL — ABNORMAL LOW (ref 8.9–10.3)
Chloride: 103 mmol/L (ref 98–111)
Creatinine, Ser: 1.12 mg/dL — ABNORMAL HIGH (ref 0.44–1.00)
GFR, Estimated: 52 mL/min — ABNORMAL LOW (ref >60)
Glucose, Bld: 130 mg/dL — ABNORMAL HIGH (ref 70–99)
Potassium: 3.7 mmol/L (ref 3.5–5.1)
Sodium: 137 mmol/L (ref 135–145)

## 2023-06-10 LAB — HEPARIN LEVEL (UNFRACTIONATED): Heparin Unfractionated: >1.1 [IU]/mL — ABNORMAL HIGH (ref 0.30–0.70)

## 2023-06-10 LAB — ECHOCARDIOGRAM COMPLETE
AR max vel: 1.55 cm2
AV Area VTI: 1.53 cm2
AV Area mean vel: 1.62 cm2
AV Mean grad: 5 mm[Hg]
AV Peak grad: 10.1 mm[Hg]
Ao pk vel: 1.59 m/s
Area-P 1/2: 1.26 cm2
Height: 65 in
MV VTI: 0.52 cm2
S' Lateral: 3.2 cm
Weight: 3597.91 [oz_av]

## 2023-06-10 LAB — SEDIMENTATION RATE: Sed Rate: 42 mm/h — ABNORMAL HIGH (ref 0–30)

## 2023-06-10 LAB — TSH: TSH: 0.89 u[IU]/mL (ref 0.350–4.500)

## 2023-06-10 LAB — C-REACTIVE PROTEIN: CRP: 19.4 mg/dL — ABNORMAL HIGH (ref <1.0)

## 2023-06-10 LAB — TROPONIN I (HIGH SENSITIVITY)
Troponin I (High Sensitivity): 331 ng/L (ref <18)
Troponin I (High Sensitivity): 362 ng/L (ref <18)
Troponin I (High Sensitivity): 228 ng/L (ref <18)

## 2023-06-10 LAB — T4, FREE: Free T4: 2.01 ng/dL — ABNORMAL HIGH (ref 0.61–1.12)

## 2023-06-10 MED ORDER — AMIODARONE HCL 200 MG PO TABS
200.0000 mg | ORAL_TABLET | Freq: Every day | ORAL | Status: DC
  Administered 2023-06-10 – 2023-06-11 (×2): 200 mg via ORAL
  Filled 2023-06-10 (×3): qty 1

## 2023-06-10 MED ORDER — ACETAMINOPHEN 650 MG RE SUPP: 650.0000 mg | Freq: Four times a day (QID) | RECTAL | Status: DC | PRN

## 2023-06-10 MED ORDER — POTASSIUM CHLORIDE CRYS ER 20 MEQ PO TBCR
40.0000 meq | EXTENDED_RELEASE_TABLET | Freq: Once | ORAL | Status: AC
  Administered 2023-06-10: 40 meq via ORAL
  Filled 2023-06-10: qty 2

## 2023-06-10 MED ORDER — ASPIRIN 81 MG PO CHEW
324.0000 mg | CHEWABLE_TABLET | Freq: Once | ORAL | Status: AC
  Administered 2023-06-10: 324 mg via ORAL
  Filled 2023-06-10: qty 4

## 2023-06-10 MED ORDER — ONDANSETRON HCL 4 MG/2ML IJ SOLN
4.0000 mg | Freq: Once | INTRAMUSCULAR | Status: AC
  Administered 2023-06-10: 4 mg via INTRAVENOUS
  Filled 2023-06-10: qty 2

## 2023-06-10 MED ORDER — ONDANSETRON HCL 4 MG/2ML IJ SOLN
4.0000 mg | Freq: Four times a day (QID) | INTRAMUSCULAR | Status: DC | PRN
  Administered 2023-06-13 (×2): 4 mg via INTRAVENOUS
  Filled 2023-06-10 (×2): qty 2

## 2023-06-10 MED ORDER — SERTRALINE HCL 50 MG PO TABS
50.0000 mg | ORAL_TABLET | Freq: Every day | ORAL | Status: DC
Start: 2023-06-11 — End: 2023-06-17
  Administered 2023-06-11 – 2023-06-17 (×7): 50 mg via ORAL
  Filled 2023-06-10 (×7): qty 1

## 2023-06-10 MED ORDER — MAGNESIUM SULFATE 2 GM/50ML IV SOLN
2.0000 g | Freq: Once | INTRAVENOUS | Status: AC
  Administered 2023-06-10: 2 g via INTRAVENOUS
  Filled 2023-06-10: qty 50

## 2023-06-10 MED ORDER — ASPIRIN 81 MG PO TBEC
81.0000 mg | DELAYED_RELEASE_TABLET | Freq: Every day | ORAL | Status: DC
Start: 2023-06-11 — End: 2023-06-17
  Administered 2023-06-11 – 2023-06-17 (×7): 81 mg via ORAL
  Filled 2023-06-10 (×7): qty 1

## 2023-06-10 MED ORDER — FENTANYL CITRATE PF 50 MCG/ML IJ SOSY
50.0000 ug | PREFILLED_SYRINGE | Freq: Once | INTRAMUSCULAR | Status: AC
Start: 2023-06-10 — End: 2023-06-10
  Administered 2023-06-10: 50 ug via INTRAVENOUS
  Filled 2023-06-10: qty 1

## 2023-06-10 MED ORDER — IOHEXOL 300 MG/ML  SOLN
100.0000 mL | Freq: Once | INTRAMUSCULAR | Status: AC | PRN
  Administered 2023-06-10: 100 mL via INTRAVENOUS

## 2023-06-10 MED ORDER — AMIODARONE HCL 200 MG PO TABS: 200.0000 mg | ORAL_TABLET | Freq: Every day | ORAL | Status: DC

## 2023-06-10 MED ORDER — SODIUM CHLORIDE 0.9 % IV SOLN
2.0000 g | INTRAVENOUS | Status: DC
  Administered 2023-06-11 – 2023-06-17 (×8): 2 g via INTRAVENOUS
  Filled 2023-06-10 (×8): qty 20

## 2023-06-10 MED ORDER — HEPARIN (PORCINE) 25000 UT/250ML-% IV SOLN
2100.0000 [IU]/h | INTRAVENOUS | Status: DC
Start: 2023-06-10 — End: 2023-06-17
  Administered 2023-06-10: 1050 [IU]/h via INTRAVENOUS
  Administered 2023-06-11: 1600 [IU]/h via INTRAVENOUS
  Administered 2023-06-11: 1350 [IU]/h via INTRAVENOUS
  Administered 2023-06-12: 1850 [IU]/h via INTRAVENOUS
  Administered 2023-06-13: 2100 [IU]/h via INTRAVENOUS
  Administered 2023-06-13: 1850 [IU]/h via INTRAVENOUS
  Administered 2023-06-14 – 2023-06-16 (×4): 2100 [IU]/h via INTRAVENOUS
  Filled 2023-06-10 (×12): qty 250

## 2023-06-10 MED ORDER — METRONIDAZOLE 500 MG PO TABS
500.0000 mg | ORAL_TABLET | Freq: Two times a day (BID) | ORAL | Status: DC
  Administered 2023-06-10 – 2023-06-11 (×2): 500 mg via ORAL
  Filled 2023-06-10 (×2): qty 1

## 2023-06-10 MED ORDER — HEPARIN BOLUS VIA INFUSION
2400.0000 [IU] | Freq: Once | INTRAVENOUS | Status: AC
Start: 2023-06-10 — End: 2023-06-10
  Administered 2023-06-10: 2400 [IU] via INTRAVENOUS
  Filled 2023-06-10: qty 2400

## 2023-06-10 MED ORDER — METOPROLOL SUCCINATE ER 50 MG PO TB24
50.0000 mg | ORAL_TABLET | Freq: Every day | ORAL | Status: DC
  Filled 2023-06-10: qty 1

## 2023-06-10 MED ORDER — ONDANSETRON HCL 4 MG PO TABS
4.0000 mg | ORAL_TABLET | Freq: Four times a day (QID) | ORAL | Status: DC | PRN
  Filled 2023-06-10: qty 1

## 2023-06-10 MED ORDER — ACETAMINOPHEN 325 MG PO TABS
650.0000 mg | ORAL_TABLET | Freq: Four times a day (QID) | ORAL | Status: DC | PRN
  Administered 2023-06-10 – 2023-06-12 (×3): 650 mg via ORAL
  Filled 2023-06-10 (×3): qty 2

## 2023-06-10 MED ORDER — POTASSIUM CHLORIDE CRYS ER 20 MEQ PO TBCR
40.0000 meq | EXTENDED_RELEASE_TABLET | ORAL | Status: AC
  Administered 2023-06-10 (×2): 40 meq via ORAL
  Filled 2023-06-10 (×2): qty 2

## 2023-06-10 NOTE — ED Notes (Signed)
Patient noted to have BM. Patient cleaned and and placed in new brief. Repositioned in bed at this time. No other needs expressed. Meal tray at bedside.

## 2023-06-10 NOTE — Assessment & Plan Note (Signed)
On admission, her blood pressure medications normally include Aldactone, Toprol-XL, Cozaar.  Will hold her Cozaar and Aldactone due to dehydration.  Will continue Toprol-XL with hold parameters

## 2023-06-10 NOTE — Progress Notes (Signed)
 ANTICOAGULATION CONSULT NOTE  Pharmacy Consult for heparin  infusion Indication: ACS/STEMI  No Known Allergies  Patient Measurements: Height: 5' 5 (165.1 cm) Weight: 102 kg (224 lb 13.9 oz) IBW/kg (Calculated) : 57 Heparin  Dosing Weight: 80.5 kg  Vital Signs: Temp: 98.2 F (36.8 C) (02/04 0934) Temp Source: Oral (02/04 0934) BP: 113/55 (02/04 1300) Pulse Rate: 68 (02/04 1300)  Labs: Recent Labs    06/09/23 2153 06/10/23 0053 06/10/23 0359 06/10/23 0731 06/10/23 1242  HGB 10.2*  --   --   --   --   HCT 31.4*  --   --   --   --   PLT 186  --   --   --   --   APTT  --   --  37*  --  39*  LABPROT  --   --  18.9*  --   --   INR  --   --  1.6*  --   --   HEPARINUNFRC  --   --  >1.10*  --   --   CREATININE 1.14*  --   --   --   --   TROPONINIHS 283* 331*  --  362*  --     Estimated Creatinine Clearance: 52.8 mL/min (A) (by C-G formula based on SCr of 1.14 mg/dL (H)).   Medical History: Past Medical History:  Diagnosis Date   Allergy    Anxiety    Arthritis    Asthma    Chronic back pain    Chronic combined systolic (congestive) and diastolic (congestive) heart failure (HCC)    a. 02/2020 Echo: EF 60-65%, no rwma. Gr2 DD. Nl RV size/fxn. Mild LAE. Mild MR; b. 10/2021 Echo: [facility] following MVR) EF 40 to 45%.   Depression    Diverticulitis    Endocarditis    a. 10/2021 S sanguinous bacteremia w/ MV veg-->79mm Mitris bioprosthetic valve, Maze, and LAA clip.   Hyperlipidemia    Hypertension    Lipoma    Mild mitral regurgitation    a. 08/2016 Echo: mild MR; b. 02/2020 Echo: Mild MR.   Neuromuscular disorder (HCC)    Obesity    Obstructive sleep apnea    PAF (paroxysmal atrial fibrillation) (HCC)    a. Dx 05/2016--> Xarelto  (CHA2DS2VASc = 3); b. 08/2016 Echo: EF 55-60%, no rwma, mild MR, mod dil LA; c. 03/2020 [facility]: Avg HR 61 w/ Afib burden of 24% - max rate 189 (avg 93). 2 pauses - longest 4.7 secs post-conversion pauses (occurred @ 5:42 am); d. 04/2022 s/p DCCV; e.  08/2022 s/p DCCV.   Paroxysmal SVT (supraventricular tachycardia) (HCC)    S/P mitral valve replacement    a. 10/2021 [facility] - Endocarditis/MV Veg-->82mm Mitris bioprosthetic valve, Maze, and LAA clip.   Sleep apnea    Stroke [facility])    a. 02/2020 MRI/A: multifocal acute ischemia in R MCA territory predominantly involving post insula and R middle frontal gyrus. Nl MRA.   Tachy-brady syndrome (HCC)    Tobacco abuse     Medications:  PTA Meds: Rivaroxaban  20 mg qpm, last dose unknown  Assessment: Pt is a 73 yo female presenting to ED c/o diarrhea x6 days and vomiting. Reports feeling of heart fluttering and pain that started x2 days ago, found with elevated BNP and Troponin I level trending up.  Goal of Therapy:  Heparin  level 0.3-0.7 units/ml aPTT 66-102 seconds Monitor platelets by anticoagulation protocol: Yes  Monitoring: 2/4: aPTT @ 1242 was 39 Subtherapeutic  Plan: Bolus  2400 units of heparin  and increase heparin  infusion to 1350 units/hr Re-check aPTT in 8 hours Daily HL as rivaroxaban  washes out CBC daily while on heparin  infusion  Will M. Lenon, PharmD Clinical Pharmacist 06/10/2023 2:01 PM

## 2023-06-10 NOTE — Assessment & Plan Note (Signed)
On admission, given empiric 40 meq of potassium.  Given her diarrhea, we need to give her more.  Recheck her labs afternoon.

## 2023-06-10 NOTE — H&P (Addendum)
 History and Physical    Tina Peters FMW:[id number] DOB: 1950-10-23 DOA: 06/09/2023  DOS: the patient was seen and examined on 06/09/2023  PCP: Myrla Jon HERO, MD   Patient coming from: Home  I have personally briefly reviewed patient's old medical records in [facility]  HPI: 73 year old female history of A-fib on Xarelto , hypertension, CKD stage IIIa, prior history of stroke, history of systolic heart failure EF of 45%, hypertension, presents to the ER with a 1-week history of nausea, vomiting and diarrhea.  Patient states that she got sick last week.  Was seen in the ER on January 26.  She had palpitations that time.  Workup was negative.  Patient discharged to home.  She followed up in the office and was diagnosed with flulike symptoms.  She had a respiratory viral panel that was all negative.  She continued to have diarrhea along with nausea and vomiting.  She states that she felt some tightness in her chest and her abdomen.  She has been vomiting.  She felt some palpitations in the middle of the night.  She came to the ER for evaluation.  Arrival temp 90.3 heart rate 75 blood pressure 96/54.  Initial labs showed a lactic acid 2.5, sodium 133, potassium 2.8, BUN of 17, creatinine 1.14, glucose of 139, bicarb 20  White count of 43.3, hemoglobin 10.2, platelets of 186  COVID-negative, influenza negative, RSV negative  BNP elevated at 720  Procalcitonin elevated at 31.85  Urinalysis is negative.  Initial troponin of 283.  Second troponin of 331.  Patient started on IV heparin .  Initial lactic acid of 2.6.  Patient hydrated.  Repeat lactic acid 1.7.  Initial EKG showed normal sinus rhythm with a QTc of 623 ms  Her potassium was corrected with 40 of p.o. KCl  After hydration and potassium replacement, QTc decreased to 453  Patient started on IV heparin  due to her elevated troponin.  Patient started on IV cefepime  and IV Flagyl  and IV vancomycin  due to her  elevated procalcitonin and leukocytosis  [facility] contacted for admission.  Significant Events: Admitted 06/09/2023 gastroenteritis, sepsis, NSTEMI   Significant Labs: lactic acid 2.5, sodium 133, potassium 2.8, BUN of 17, creatinine 1.14, glucose of 139, bicarb 20 White count of 43.3, hemoglobin 10.2, platelets of 186 COVID-negative, influenza negative, RSV negative BNP elevated at 720 Procalcitonin elevated at 31.85 Urinalysis is negative. Initial troponin of 283.  Second troponin of 331.  Significant Imaging Studies: CT chest/abd/pelvis No acute abnormality of the chest, abdomen or pelvis.2. Cholelithiasis without acute inflammation 3. Rectosigmoid diverticulosis without acute inflammation. RUQ U/S Cholelithiasis and gallbladder sludge with slight gallbladder dilatation. No free wall thickening or positive sonographic Murphy's sign. 2. No biliary ductal dilatation. 3. Mild hepatic steatosis  Antibiotic Therapy: Anti-infectives (From admission, onward)    Start     Dose/Rate Route Frequency Ordered Stop   06/09/23 2245  ceFEPIme  (MAXIPIME ) 2 g in sodium chloride  0.9 % 100 mL IVPB        2 g 200 mL/hr over 30 Minutes Intravenous  Once 06/09/23 2238 06/09/23 2324   06/09/23 2245  metroNIDAZOLE  (FLAGYL ) IVPB 500 mg        500 mg 100 mL/hr over 60 Minutes Intravenous  Once 06/09/23 2238 06/10/23 0035   06/09/23 2245  vancomycin  (VANCOCIN ) IVPB 1000 mg/200 mL premix        1,000 mg 200 mL/hr over 60 Minutes Intravenous  Once 06/09/23 2238 06/10/23 0035  Procedures:   Consultants: Cardiology    ED Course: Patient given IV fluids, blood cultures obtained, started on IV antibiotics, started on IV heparin   Review of Systems:  Review of Systems  Constitutional:  Positive for malaise/fatigue.  HENT: Negative.    Eyes: Negative.   Respiratory: Negative.    Cardiovascular:  Positive for palpitations.       Vague chest pressure  Gastrointestinal:  Positive for  diarrhea, nausea and vomiting.  Genitourinary:  Negative for dysuria, frequency and urgency.  Musculoskeletal:  Positive for myalgias.  Skin: Negative.   Neurological:  Positive for weakness.  Endo/Heme/Allergies: Negative.   Psychiatric/Behavioral: Negative.    All other systems reviewed and are negative.   Past Medical History:  Diagnosis Date   Allergy    Anxiety    Arthritis    Asthma    Chronic back pain    Chronic combined systolic (congestive) and diastolic (congestive) heart failure (HCC)    a. 02/2020 Echo: EF 60-65%, no rwma. Gr2 DD. Nl RV size/fxn. Mild LAE. Mild MR; b. 10/2021 Echo: [facility] following MVR) EF 40 to 45%.   Depression    Diverticulitis    Endocarditis    a. 10/2021 S sanguinous bacteremia w/ MV veg-->44mm Mitris bioprosthetic valve, Maze, and LAA clip.   Hyperlipidemia    Hypertension    Lipoma    Mild mitral regurgitation    a. 08/2016 Echo: mild MR; b. 02/2020 Echo: Mild MR.   Neuromuscular disorder (HCC)    Obesity    Obstructive sleep apnea    PAF (paroxysmal atrial fibrillation) (HCC)    a. Dx 05/2016--> Xarelto  (CHA2DS2VASc = 3); b. 08/2016 Echo: EF 55-60%, no rwma, mild MR, mod dil LA; c. 03/2020 [facility]: Avg HR 61 w/ Afib burden of 24% - max rate 189 (avg 93). 2 pauses - longest 4.7 secs post-conversion pauses (occurred @ 5:42 am); d. 04/2022 s/p DCCV; e. 08/2022 s/p DCCV.   Paroxysmal SVT (supraventricular tachycardia) (HCC)    S/P mitral valve replacement    a. 10/2021 [facility] - Endocarditis/MV Veg-->15mm Mitris bioprosthetic valve, Maze, and LAA clip.   Sleep apnea    Stroke [facility])    a. 02/2020 MRI/A: multifocal acute ischemia in R MCA territory predominantly involving post insula and R middle frontal gyrus. Nl MRA.   Tachy-brady syndrome (HCC)    Tobacco abuse     Past Surgical History:  Procedure Laterality Date   ACHILLES TENDON SURGERY Left    BACK SURGERY     2 ruptured discs in L spine, no hardware   CARDIOVERSION N/A 04/22/2022   Procedure:  CARDIOVERSION;  Surgeon: Darron Deatrice LABOR, MD;  Location: [facility] ORS;  Service: Cardiovascular;  Laterality: N/A;   CARDIOVERSION N/A 08/28/2022   Procedure: CARDIOVERSION;  Surgeon: Darliss Rogue, MD;  Location: [facility] ORS;  Service: Cardiovascular;  Laterality: N/A;   KNEE ARTHROSCOPY W/ MENISCAL REPAIR Right    in the 1990s   SALPINGOOPHORECTOMY Bilateral    TUMOR REMOVAL     neck, ?lipoma   VALVE REPLACEMENT  10/04/2021     reports that she quit smoking about 10 years ago. Her smoking use included cigarettes. She started smoking about 50 years ago. She has a 10 pack-year smoking history. She has never used smokeless tobacco. She reports current alcohol use. She reports that she does not use drugs.  No Known Allergies  Family History  Problem Relation Age of Onset   Stroke Mother    Diabetes Mother  Fibrocystic breast disease Mother    CAD Father    Alcoholism Father    Fibrocystic breast disease Sister    Diabetes Mellitus I Grandson    Breast cancer Neg Hx    Colon cancer Neg Hx     Prior to Admission medications   Medication Sig Start Date End Date Taking? Authorizing Provider  acetaminophen  (TYLENOL ) 325 MG tablet Take by mouth. 4 tablets as needed    [provider]  albuterol  (VENTOLIN  HFA) 108 (90 Base) MCG/ACT inhaler Inhale 2 puffs into the lungs every 6 (six) hours as needed for wheezing or shortness of breath. 08/17/21   Bacigalupo, Angela M, MD  amiodarone  (PACERONE ) 200 MG tablet Take 1 tablet (200 mg total) by mouth daily. Overdue follow up visit.  PLEASE CALL OFFICE TO SCHEDULE APPOINTMENT PRIOR TO NEXT REFILL 04/17/23   Riddle, Suzann, NP  atorvastatin  (LIPITOR) 40 MG tablet TAKE 1 TABLET BY MOUTH EVERY DAY 12/09/22   Myrla Jon HERO, MD  hydrocortisone  2.5 % cream Apply 1 application topically as needed. 03/10/20   [provider]  losartan  (COZAAR ) 25 MG tablet Take 1 tablet (25 mg total) by mouth daily. 07/26/22   Vivienne Lonni Ingle, NP  methocarbamol  (ROBAXIN ) 500 MG tablet TAKE 1 TABLET BY MOUTH EVERY 8 HOURS AS NEEDED FOR MUSCLE SPASMS. 04/16/23   Bacigalupo, Angela M, MD  metoprolol  succinate (TOPROL -XL) 50 MG 24 hr tablet TAKE 1 TABLET BY MOUTH EVERY DAY 03/10/23   Darron Deatrice LABOR, MD  ondansetron  (ZOFRAN -ODT) 4 MG disintegrating tablet Take 1 tablet (4 mg total) by mouth every 8 (eight) hours as needed for nausea or vomiting. 06/03/23   Tapia, Leisa, PA-C  rivaroxaban  (XARELTO ) 20 MG TABS tablet TAKE 1 TABLET (20 MG TOTAL) BY MOUTH DAILY WITH SUPPER. TAKE WITH DINNER OR LARGEST MEAL OF THE DAY 04/16/23   Myrla Jon HERO, MD  sertraline  (ZOLOFT ) 50 MG tablet TAKE 1 TABLET BY MOUTH EVERY DAY 04/16/23   Myrla Jon HERO, MD  spironolactone  (ALDACTONE ) 25 MG tablet Take 0.5 tablets by mouth daily.    [provider]    Physical Exam: Vitals:   06/10/23 1000 06/10/23 1100 06/10/23 1200 06/10/23 1300  BP: (!) 113/47 (!) 102/55 (!) 106/52 (!) 113/55  Pulse: 65 60 (!) 59 68  Resp: (!) 21 (!) 25 18 18   Temp:      TempSrc:      SpO2: 100% 100% 100% 99%  Weight:      Height:        Physical Exam Vitals and nursing note reviewed.  Constitutional:      Appearance: She is obese.  HENT:     Head: Normocephalic and atraumatic.  Eyes:     General: No scleral icterus. Cardiovascular:     Rate and Rhythm: Normal rate and regular rhythm.  Pulmonary:     Effort: Pulmonary effort is normal. No respiratory distress.     Breath sounds: Normal breath sounds. No wheezing.  Abdominal:     General: Bowel sounds are normal. There is no distension.     Palpations: Abdomen is soft.     Tenderness: There is no abdominal tenderness. There is no guarding.  Musculoskeletal:     Right lower leg: No edema.     Left lower leg: No edema.  Skin:    General: Skin is warm and dry.     Capillary Refill: Capillary refill takes less than 2 seconds.  Neurological:     General:  No focal deficit present.     Mental  Status: She is oriented to person, place, and time.      Labs on Admission: I have personally reviewed following labs and imaging studies  CBC: Recent Labs  Lab 06/09/23 2153  WBC 43.3*  NEUTROABS 38.8*  HGB 10.2*  HCT 31.4*  MCV 82.2  PLT 186   Basic Metabolic Panel: Recent Labs  Lab 06/09/23 2153  NA 133*  K 2.8*  CL 97*  CO2 20*  GLUCOSE 139*  BUN 17  CREATININE 1.14*  CALCIUM  8.1*  MG 1.8   GFR: Estimated Creatinine Clearance: 52.8 mL/min (A) (by C-G formula based on SCr of 1.14 mg/dL (H)). Liver Function Tests: Recent Labs  Lab 06/09/23 2153  AST 53*  ALT 27  ALKPHOS 107  BILITOT 1.8*  PROT 6.8  ALBUMIN 2.9*   Recent Labs  Lab 06/09/23 2153  LIPASE 22   Coagulation Profile: Recent Labs  Lab 06/10/23 0359  INR 1.6*   Cardiac Enzymes: Recent Labs  Lab 06/09/23 2153 06/10/23 0053 06/10/23 0731  TROPONINIHS 283* 331* 362*   BNP (last 3 results) Recent Labs    06/09/23 2153  BNP 720.2*   Urine analysis:    Component Value Date/Time   COLORURINE YELLOW (A) 06/09/2023 2153   APPEARANCEUR CLEAR (A) 06/09/2023 2153   LABSPEC 1.028 06/09/2023 2153   PHURINE 6.0 06/09/2023 2153   GLUCOSEU NEGATIVE 06/09/2023 2153   HGBUR SMALL (A) 06/09/2023 2153   BILIRUBINUR NEGATIVE 06/09/2023 2153   KETONESUR NEGATIVE 06/09/2023 2153   PROTEINUR NEGATIVE 06/09/2023 2153   NITRITE NEGATIVE 06/09/2023 2153   LEUKOCYTESUR NEGATIVE 06/09/2023 2153    Radiological Exams on Admission: I have personally reviewed images ECHOCARDIOGRAM COMPLETE Result Date: 06/10/2023    ECHOCARDIOGRAM REPORT   Patient Name:   SHAYA REDDICK Date of Exam: 06/10/2023 Medical Rec #:  [id number]       Height:       65.0 in Accession #:    [id number]      Weight:       224.9 lb Date of Birth:  [birth year]       BSA:          2.079 m Patient Age:    72 years        BP:           97/55 mmHg Patient Gender: F               HR:           72 bpm. Exam Location:  [facility] Procedure: 2D Echo,  Cardiac Doppler and Color Doppler Indications:     Chest pain R07.9  History:         Patient has prior history of Echocardiogram examinations, most                  recent 07/24/2022. Risk Factors:Hypertension and Dyslipidemia.                  S/P Bovine mitral valve replacement.  Sonographer:     Christopher Furnace Referring Phys:  [id number] CHRISTOPHER END Diagnosing Phys: Lonni Hanson MD IMPRESSIONS  1. Left ventricular ejection fraction, by estimation, is 50 to 55%. The left ventricle has low normal function. The left ventricle has no regional wall motion abnormalities. Left ventricular diastolic function could not be evaluated.  2. Right ventricular systolic function is mildly reduced. The right ventricular size is mildly enlarged.  3. Left atrial size  was severely dilated.  4. Right atrial size was mildly dilated.  5. The mitral valve has been repaired/replaced. No evidence of mitral valve regurgitation. Moderate to severe mitral stenosis. The mean mitral valve gradient is 11.0 mmHg.  6. Tricuspid valve regurgitation is mild to moderate.  7. The aortic valve is tricuspid. There is mild thickening of the aortic valve. Aortic valve regurgitation is not visualized. Aortic valve sclerosis/calcification is present, without any evidence of aortic stenosis. Conclusion(s)/Recommendation(s): Mitral valve prosthesis appears thickened with increased mean gradient since last echo shortly after implantation in 10/2021 at [facility] (mean gratient 5 -> 11 mmHg). Endocarditis and/or pannus formation cannot be excluded, though a mobile vegetation is not clearly seen. Consider further evaluation by transesophageal echocardiogram. FINDINGS  Left Ventricle: Left ventricular ejection fraction, by estimation, is 50 to 55%. The left ventricle has low normal function. The left ventricle has no regional wall motion abnormalities. The left ventricular internal cavity size was normal in size. There is borderline left ventricular hypertrophy. Left  ventricular diastolic function could not be evaluated due to mitral valve replacement. Left ventricular diastolic function could not be evaluated. Right Ventricle: The right ventricular size is mildly enlarged. No increase in right ventricular wall thickness. Right ventricular systolic function is mildly reduced. Left Atrium: Left atrial size was severely dilated. Right Atrium: Right atrial size was mildly dilated. Pericardium: There is no evidence of pericardial effusion. Mitral Valve: The mitral valve has been repaired/replaced. No evidence of mitral valve regurgitation. There is a 27 mm Mitris bioprosthetic valve present in the mitral position. Moderate to severe mitral valve stenosis. MV peak gradient, 25.8 mmHg. The mean mitral valve gradient is 11.0 mmHg. Tricuspid Valve: The tricuspid valve is normal in structure. Tricuspid valve regurgitation is mild to moderate. Aortic Valve: The aortic valve is tricuspid. There is mild thickening of the aortic valve. There is mild aortic valve annular calcification. Aortic valve regurgitation is not visualized. Aortic valve sclerosis/calcification is present, without any evidence of aortic stenosis. Aortic valve mean gradient measures 5.0 mmHg. Aortic valve peak gradient measures 10.1 mmHg. Aortic valve area, by VTI measures 1.53 cm. Pulmonic Valve: The pulmonic valve was normal in structure. Pulmonic valve regurgitation is mild to moderate. No evidence of pulmonic stenosis. Aorta: The aortic root is normal in size and structure. Pulmonary Artery: The pulmonary artery is of normal size. IAS/Shunts: No atrial level shunt detected by color flow Doppler.  LEFT VENTRICLE PLAX 2D LVIDd:         5.00 cm LVIDs:         3.20 cm LV PW:         1.10 cm LV IVS:        1.00 cm LVOT diam:     2.00 cm LV SV:         45 LV SV Index:   22 LVOT Area:     3.14 cm  RIGHT VENTRICLE RV Basal diam:  4.30 cm RV Mid diam:    3.60 cm RV S prime:     11.00 cm/s TAPSE (M-mode): 2.0 cm LEFT ATRIUM               Index        RIGHT ATRIUM           Index LA diam:        4.60 cm  2.21 cm/m   RA Area:     23.30 cm LA Vol (A2C):   107.0 ml 51.46 ml/m  RA Volume:  73.70 ml  35.45 ml/m LA Vol (A4C):   119.0 ml 57.23 ml/m LA Biplane Vol: 114.0 ml 54.83 ml/m  AORTIC VALVE                     PULMONIC VALVE AV Area (Vmax):    1.55 cm      PR End Diast Vel: 11.16 msec AV Area (Vmean):   1.62 cm AV Area (VTI):     1.53 cm AV Vmax:           158.67 cm/s AV Vmean:          103.633 cm/s AV VTI:            0.293 m AV Peak Grad:      10.1 mmHg AV Mean Grad:      5.0 mmHg LVOT Vmax:         78.20 cm/s LVOT Vmean:        53.300 cm/s LVOT VTI:          0.143 m LVOT/AV VTI ratio: 0.49  AORTA Ao Root diam: 3.00 cm MITRAL VALVE                TRICUSPID VALVE MV Area (PHT): 1.26 cm     TR Peak grad:   35.5 mmHg MV Area VTI:   0.52 cm     TR Vmax:        298.00 cm/s MV Peak grad:  25.8 mmHg MV Mean grad:  11.0 mmHg    SHUNTS MV Vmax:       2.54 m/s     Systemic VTI:  0.14 m MV Vmean:      152.0 cm/s   Systemic Diam: 2.00 cm MV Decel Time: 604 msec MV E velocity: 188.00 cm/s Lonni Hanson MD Electronically signed by Lonni Hanson MD Signature Date/Time: 06/10/2023/10:36:59 AM    Final    US  ABDOMEN LIMITED RUQ (LIVER/GB) Result Date: 06/10/2023 CLINICAL DATA:  [id number]. Gallstones on CT today. Patient undergoing workup for sepsis. EXAM: ULTRASOUND ABDOMEN LIMITED RIGHT UPPER QUADRANT COMPARISON:  CT earlier today. FINDINGS: Gallbladder: There is slight gallbladder dilatation. Multiple layering subcentimeter stones are noted in the proximal lumen intermixed with sludge. There is no free wall thickening or positive sonographic Murphy's sign, no pericholecystic fluid. Common bile duct: Diameter: 1.4 mm with no intrahepatic bile duct dilatation. Liver: No focal lesion identified. There is mild increased hepatic echogenicity consistent with steatosis. Portal vein is patent on color Doppler imaging with normal direction of blood  flow towards the liver. Other: None. IMPRESSION: 1. Cholelithiasis and gallbladder sludge with slight gallbladder dilatation. No free wall thickening or positive sonographic Murphy's sign. 2. No biliary ductal dilatation. 3. Mild hepatic steatosis. Electronically Signed   By: Francis Quam M.D.   On: 06/10/2023 03:02   CT CHEST ABDOMEN PELVIS W CONTRAST Result Date: 06/10/2023 CLINICAL DATA:  Sepsis EXAM: CT CHEST, ABDOMEN, AND PELVIS WITH CONTRAST TECHNIQUE: Multidetector CT imaging of the chest, abdomen and pelvis was performed following the standard protocol during bolus administration of intravenous contrast. RADIATION DOSE REDUCTION: This exam was performed according to the departmental dose-optimization program which includes automated exposure control, adjustment of the mA and/or kV according to patient size and/or use of iterative reconstruction technique. CONTRAST:  OMNIPAQUE  IOHEXOL  300 MG/ML  SOLN COMPARISON:  None Available. FINDINGS: CT CHEST FINDINGS Cardiovascular: Mild cardiomegaly. Aortic valve replacement. Mild calcific aortic atherosclerosis. No pericardial effusion. Mediastinum/Nodes: No enlarged mediastinal, hilar, or axillary lymph nodes.  Thyroid  gland, trachea, and esophagus demonstrate no significant findings. Lungs/Pleura: Mild left basilar atelectasis. Otherwise unremarkable. Musculoskeletal: No chest wall mass or suspicious bone lesions identified. CT ABDOMEN PELVIS FINDINGS Hepatobiliary: Normal hepatic contours. No intra- or extrahepatic biliary dilatation. There is cholelithiasis without acute inflammation. Pancreas: Normal pancreas. No ductal dilatation or peripancreatic fluid collection. Spleen: Normal. Adrenals/Urinary Tract: The adrenal glands are normal. No hydronephrosis, nephroureterolithiasis or solid renal mass. The urinary bladder is normal for degree of distention Stomach/Bowel: There is no hiatal hernia. Normal duodenal course and caliber. No small bowel dilatation  or inflammation. Rectosigmoid diverticulosis without acute inflammation. Normal appendix. Vascular/Lymphatic: There is calcific atherosclerosis of the abdominal aorta. No lymphadenopathy. Reproductive: Normal prostate size with symmetric seminal vesicles. Other: None. Musculoskeletal: No bony spinal canal stenosis or focal osseous abnormality. IMPRESSION: 1. No acute abnormality of the chest, abdomen or pelvis. 2. Cholelithiasis without acute inflammation 3. Rectosigmoid diverticulosis without acute inflammation. Electronically Signed   By: Franky Stanford M.D.   On: 06/10/2023 00:54    EKG: My personal interpretation of EKG shows: NSR with prolonged QTC    Repeat EKG shows NSR with QTC now normal.   Assessment/Plan Principal Problem:   Acute gastroenteritis Active Problems:   Demand ischemia (HCC)   Sepsis with acute organ dysfunction without septic shock (HCC)   Hypokalemia   Essential hypertension   OSA (obstructive sleep apnea)   Chronic anticoagulation   Stage 3a chronic kidney disease (HCC) - Baseline creatinine 1.1-1.4   HFrEF (heart failure with reduced ejection fraction) (HCC)   Chronic a-fib (HCC)   Obesity, Class II, BMI 35-39.9    Assessment and Plan: * Acute gastroenteritis On admission, patient had a week of nausea, vomiting, diarrhea.  She has abdominal soreness from all her vomiting.  Right upper quadrant ultrasound shows cholelithiasis without evidence of acute cholecystitis.  Clinically she is behaving like she has norovirus but will need to check her stool studies.  Place her on empiric enteric precautions.  Continue with IV hydration.  Okay for clear liquid diet.  Sepsis with acute organ dysfunction without septic shock (HCC) On admission, she has sepsis with acute organ dysfunction without septic shock.  Evidenced by a white count of 43,000, elevated lactic acid of 2.5, a weeks worth of diarrhea.  She was empirically started on cefepime , vancomycin  and Flagyl .   Elevated procalcitonin to 31.85.  Blood cultures were sent.  Will continue empiric antibiotics for now.  Demand ischemia (HCC) On admission, elevated troponins.  Will need to trend her troponins.  Started on empiric IV heparin .  Cardiology consulted.  Hypokalemia On admission, given empiric 40 meq of potassium.  Given her diarrhea, we need to give her more.  Recheck her labs afternoon.  Chronic a-fib (HCC) On admission, EKG shows normal sinus rhythm.  At home she is on Xarelto  for anticoagulation and Toprol -XL for rate control.  She states that there is been discussion about possible ablation given her chronic A-fib.  Patient is currently on IV heparin  for her NSTEMI.  Holding Xarelto  for now.  She can continue her amiodarone .  Go ahead and order her Toprol -XL with hold parameters  HFrEF (heart failure with reduced ejection fraction) (HCC) On admission, patient has actually intra vascular volume depleted.  Continue with IV fluids.  Hold her Aldactone , Cozaar  for now.  Stage 3a chronic kidney disease (HCC) - Baseline creatinine 1.1-1.4 On admission, patient has a history of CKD stage IIIa.  Baseline creatinine 1.1-1.4.  Hold her Aldactone  and her  losartan  for now.  Chronic anticoagulation On admission, patient normally is on Xarelto  at home for her A-fib.  Holding Xarelto  since she is on IV heparin .  OSA (obstructive sleep apnea) On admission, continue CPAP  Essential hypertension On admission, her blood pressure medications normally include Aldactone , Toprol -XL, Cozaar .  Will hold her Cozaar  and Aldactone  due to dehydration.  Will continue Toprol -XL with hold parameters  Obesity, Class II, BMI 35-39.9 On admission, BMI 37.42   DVT prophylaxis: IV heparin  gtts Code Status: Full Code Family Communication: no family at bedside. She is decisional  Disposition Plan: return home  Consults called: cardiology([facility])  Admission status: Inpatient, Telemetry bed   Camellia Door, DO [facility] 06/10/2023, 2:01 PM

## 2023-06-10 NOTE — Hospital Course (Addendum)
 Hospital course / significant events:   HPI: 73 year old female history of A-fib on Xarelto , hypertension, CKD stage IIIa, prior history of stroke, history of systolic heart failure EF of 45%, hypertension, presents to the ER with a 1-week history of nausea, vomiting and diarrhea.  Patient states that she got sick last week.  Was seen in the ER on January 26.  She had palpitations that time.  Workup was negative.  Patient discharged to home.  She followed up in the office and was diagnosed with flulike symptoms.  She had a respiratory viral panel that was negative.  She continued to have diarrhea along with nausea and vomiting.  She states that she felt some tightness in her chest and her abdomen.  She has been vomiting.  She felt some palpitations in the middle of the night.  She came to the ER 02/03 for evaluation.  02/03: to ED evening - elevated BNP, procalcitonin, troponin 283 --> 331 started on heparin , lactate 2.6 -(1L IVF)--> 1.7, started on broad spectrum abx cefepime  + vanc + flagyl .  02/04: admitted to hospitalist service w/ concern for gastroenteritis w/ concern for associated sepsis (reasonably ruled out UTI, PNA, colitis or other intraabdominal bacterial infection).  02/05: unfortunately (+)TEE for mitral valve vegetation.  Blood culture (+)gram positive cocci in anaerobic bottle. ID consulted --> await final BCx results, continue ceftriaxone  + flagyl , checking CDiff and may need colonoscopy, recs for MRI spine d/t pain/bacteremia. Pt reports diarrhea has improved but still present. CDiff neg. Presented in sinus rhythm with development of A-fib at 21:00 on 06/11/2023, remains in A-fib with ventricular rates largely in the low 100s bpm with brief episodes into the 120s to 140s bpm  02/06: cardiology adjusting amio and beta blocker. MRI T/L spine: no findings to indicate osteomyelitis/discitis. GPC culture reincubated for better growth 02/07: repeat BCx draw this morning   02/08-02/09: no growth  thus far on new cultures.. Await final abx recs and outpatient abx / PICC expect for Mon 02/10  02/10: confirmed w/ CT surgery re: no plan for surgical intervention, f/u w/ TEE 6 weeks. 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  Infection likely caught relatively early given no significant valve regurgitation, grossly no abscess formation though will need to be monitored closely  Hx: June 2023 admitted to [facility] Streptococcus sanguinous bacteremia from infected tooth with mitral valve vegetation, at that time underwent mitral valve replacement with bioprosthetic valve MRI spine L and T spine given back pain in both areas --> no evidence osteo/discitis CT maxillofacial no dental abscess  ID following 6 weeks IV Abx w/ ceftriaxone   PICC line Following second set BCx Cardiology following, have discussed w/ CT surgery and recs for NO surgery/replacement at this time, favor plan for repeat TEE in 6 weeks to reevaluate mitral valve   Anemia No s/s bleeding Caution on heparin  Monitor Hgb/CBC  Acute gastroenteritis - improving but still some diarrhea Clinically, concern for norovirus vs other viral illness, CDiff engative  check stool studies --> negative / nothing  detected  Enteric/contact precautions have been dc as sx improved as well Diet adv as tolerated     Back pain Ruled out osteomyelitis/discitis Tylenol  prn mild Tramadol  prn mod-severe Fentanyl  IV prn breakthrough Pt educated on  goal to avoid IV pain meds if at all able   Demand ischemia  Elevated troponin Favor demand ischarmia over NSTEMI Monitoring for chest pain   Hypokalemia D/t diarrhea Replace as needed Monitor BMP   Chronic paroxysmal A-fib  On admission, EKG shows normal sinus rhythm.   Had more rapid rate here transiently Echo this admission with preserved LV systolic function and no focal wall motion abnormality  Xarelto  for anticoagulation at home - held while on heparin  (in case needing further procedure but anticipate xarelto  prior to dc) Reloaded w/ amiodarone  po  Toprol  switched to lopressor  - cardiology is titrating this  Cardiology following    On amiodarone  Thyroid  panel per cardiology   AKI Improving Follow BMP outpatient   History HFrEF (heart failure with reduced ejection fraction) NOT in exacerbation  On admission, patient has actually intra vascular volume depleted.   Echo this admission with preserved LV systolic function and no focal wall motion abnormality  Cardiology following    Stage 3a chronic kidney disease (HCC) - Baseline creatinine 1.1-1.4 On admission, patient has a history of CKD stage IIIa.  Baseline creatinine 1.1-1.4.  Cr on admission is close to baseline, no AKI Hold her Aldactone  and her losartan  for now.   OSA (obstructive sleep apnea) On admission continue CPAP   Essential hypertension Home blood pressure medications include Aldactone , Toprol -XL, Cozaar .   hold her Cozaar  and Aldactone  due to dehydration/AKI.   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  Underweight - under 18  overweight - 25 to 29 obese - 30 or more Class 1 obesity: BMI of 30.0 to 34 Class 2 obesity: BMI of 35.0 to 39 Class 3 obesity: BMI of 40.0 to 49 Super Morbid Obesity: BMI  50-59 Super-super Morbid Obesity: BMI 60+ Significantly low or high BMI is associated with higher medical risk.  Weight management advised as adjunct to other disease management and risk reduction treatments    DVT prophylaxis: heparin  IV fluids: no continuous IV fluids  Nutrition: regular diet Central lines / invasive devices: none  Code Status: FULL CODE ACP documentation reviewed:  none on file in [facility]  TOC needs: home health, PT/OT and IV abx  Barriers to dispo / significant pending items: IV abx, PICC line, fmaily education on IV abx

## 2023-06-10 NOTE — ED Notes (Signed)
Informed RN bed assigned 

## 2023-06-10 NOTE — ED Notes (Signed)
Pt noted to have small BM- not enough for stool sample. Pt cleaned and placed in new brief.

## 2023-06-10 NOTE — ED Notes (Signed)
 Echo at bedside

## 2023-06-10 NOTE — Progress Notes (Signed)
*  PRELIMINARY RESULTS* Echocardiogram 2D Echocardiogram has been performed.  Tina Peters 06/10/2023, 10:04 AM

## 2023-06-10 NOTE — Assessment & Plan Note (Signed)
 On admission, she has sepsis with acute organ dysfunction without septic shock.  Evidenced by a white count of 43,000, elevated lactic acid of 2.5, a weeks worth of diarrhea.  She was empirically started on cefepime , vancomycin  and Flagyl .  Elevated procalcitonin to 31.85.  Blood cultures were sent.  Will continue empiric antibiotics for now.

## 2023-06-10 NOTE — Telephone Encounter (Signed)
Copied from CRM [id number]. Topic: General - Other >> Jun 10, 2023  9:40 AM Turkey B wrote: Reason for CRM: pt's daughter, Ms Julien Girt called in states, pt is in the ER and they are doing an EKG on her and fluids

## 2023-06-10 NOTE — Progress Notes (Signed)
 ANTICOAGULATION CONSULT NOTE  Pharmacy Consult for heparin  infusion Indication: ACS/STEMI  No Known Allergies  Patient Measurements: Height: 5' 5 (165.1 cm) Weight: 102 kg (224 lb 13.9 oz) IBW/kg (Calculated) : 57 Heparin  Dosing Weight: 80.5 kg  Vital Signs: Temp: 98.3 F (36.8 C) (02/03 2134) Temp Source: Oral (02/03 2134) BP: 130/71 (02/04 0300) Pulse Rate: 103 (02/04 0300)  Labs: Recent Labs    06/09/23 2153 06/10/23 0053  HGB 10.2*  --   HCT 31.4*  --   PLT 186  --   CREATININE 1.14*  --   TROPONINIHS 283* 331*    Estimated Creatinine Clearance: 52.8 mL/min (A) (by C-G formula based on SCr of 1.14 mg/dL (H)).   Medical History: Past Medical History:  Diagnosis Date   Allergy    Anxiety    Arthritis    Asthma    Chronic back pain    Chronic combined systolic (congestive) and diastolic (congestive) heart failure (HCC)    a. 02/2020 Echo: EF 60-65%, no rwma. Gr2 DD. Nl RV size/fxn. Mild LAE. Mild MR; b. 10/2021 Echo: [facility] following MVR) EF 40 to 45%.   Depression    Diverticulitis    Endocarditis    a. 10/2021 S sanguinous bacteremia w/ MV veg-->45mm Mitris bioprosthetic valve, Maze, and LAA clip.   Hyperlipidemia    Hypertension    Lipoma    Mild mitral regurgitation    a. 08/2016 Echo: mild MR; b. 02/2020 Echo: Mild MR.   Neuromuscular disorder (HCC)    Obesity    Obstructive sleep apnea    PAF (paroxysmal atrial fibrillation) (HCC)    a. Dx 05/2016--> Xarelto  (CHA2DS2VASc = 3); b. 08/2016 Echo: EF 55-60%, no rwma, mild MR, mod dil LA; c. 03/2020 [facility]: Avg HR 61 w/ Afib burden of 24% - max rate 189 (avg 93). 2 pauses - longest 4.7 secs post-conversion pauses (occurred @ 5:42 am); d. 04/2022 s/p DCCV; e. 08/2022 s/p DCCV.   Paroxysmal SVT (supraventricular tachycardia) (HCC)    S/P mitral valve replacement    a. 10/2021 [facility] - Endocarditis/MV Veg-->54mm Mitris bioprosthetic valve, Maze, and LAA clip.   Sleep apnea    Stroke [facility])    a. 02/2020 MRI/A:  multifocal acute ischemia in R MCA territory predominantly involving post insula and R middle frontal gyrus. Nl MRA.   Tachy-brady syndrome (HCC)    Tobacco abuse     Medications:  PTA Meds: Rivaroxaban  20 mg qpm, last dose unknown  Assessment: Pt is a 73 yo female presenting to ED c/o diarrhea x6 days and vomiting. Reports feeling of heart fluttering and pain that started x2 days ago, found with elevated BNP and Troponin I level trending up.  Goal of Therapy:  Heparin  level 0.3-0.7 units/ml aPTT 66-102 seconds Monitor platelets by anticoagulation protocol: Yes   Plan:  Will not give initial bolus d/t unknown last dose of rivaroxaban  Start heparin  infusion at 1050 units/hr Will follow aPTT until correlation w/ HL confirmed Will check aPTT in 8 hr after start of infusion HL & CBC daily while on heparin   Rankin CANDIE Dills, PharmD, [facility] 06/10/2023 3:59 AM

## 2023-06-10 NOTE — Assessment & Plan Note (Signed)
On admission, elevated troponins.  Will need to trend her troponins.  Started on empiric IV heparin.  Cardiology consulted.

## 2023-06-10 NOTE — Assessment & Plan Note (Addendum)
On admission, patient has a history of CKD stage IIIa.  Baseline creatinine 1.1-1.4.  Hold her Aldactone and her losartan for now.

## 2023-06-10 NOTE — Telephone Encounter (Signed)
I am seeing the notes from the ER and the hospitalist admission note. Glad she went to the ED, because she is quite sick

## 2023-06-10 NOTE — ED Notes (Signed)
Pt cleaned up at this time. Small amount of stool noted in brief.

## 2023-06-10 NOTE — Assessment & Plan Note (Signed)
On admission, patient normally is on Xarelto at home for her A-fib.  Holding Xarelto since she is on IV heparin.

## 2023-06-10 NOTE — Assessment & Plan Note (Signed)
 On admission, patient had a week of nausea, vomiting, diarrhea.  She has abdominal soreness from all her vomiting.  Right upper quadrant ultrasound shows cholelithiasis without evidence of acute cholecystitis.  Clinically she is behaving like she has norovirus but will need to check her stool studies.  Place her on empiric enteric precautions.  Continue with IV hydration.  Okay for clear liquid diet.

## 2023-06-10 NOTE — Progress Notes (Signed)
 Elink monitoring for the code sepsis protocol.

## 2023-06-10 NOTE — Assessment & Plan Note (Addendum)
 On admission, EKG shows normal sinus rhythm.  At home she is on Xarelto  for anticoagulation and Toprol -XL for rate control.  She states that there is been discussion about possible ablation given her chronic A-fib.  Patient is currently on IV heparin  for her NSTEMI.  Holding Xarelto  for now.  She can continue her amiodarone .  Go ahead and order her Toprol -XL with hold parameters

## 2023-06-10 NOTE — ED Notes (Signed)
Pt perineum changed, brief changed, blankets replaced

## 2023-06-10 NOTE — Assessment & Plan Note (Signed)
On admission, patient has actually intra vascular volume depleted.  Continue with IV fluids.  Hold her Aldactone, Cozaar for now.

## 2023-06-10 NOTE — Consult Note (Signed)
 Cardiology Consult    Patient ID: Tina Peters MRN: [id number], DOB/AGE: [birth year]   Admit date: 06/09/2023 Date of Consult: 06/10/2023  Primary Physician: Myrla Jon HERO, MD Primary Cardiologist: Deatrice Cage, MD Requesting Provider: CHARLENA Door, DO  Patient Profile    Tina Peters is a 73 y.o. female with a history of paroxysmal atrial fibrillation, tachybradycardia syndrome, PSVT, hypertension, hyperlipidemia, stroke, obstructive sleep apnea on CPAP, CKD III, endocarditis s/p MVR, asthma, tobacco abuse, back pain, and diverticulitis, who is being seen today for the evaluation of PAF and elevated troponin at the request of Dr. Door.  Past Medical History  Subjective  Past Medical History:  Diagnosis Date   Allergy    Anxiety    Arthritis    Asthma    Chronic back pain    Chronic combined systolic (congestive) and diastolic (congestive) heart failure (HCC)    a. 02/2020 Echo: EF 60-65%, no rwma. Gr2 DD. Nl RV size/fxn. Mild LAE. Mild MR; b. 10/2021 Echo: [facility] following MVR) EF 40 to 45%.   Depression    Diverticulitis    Endocarditis    a. 10/2021 S sanguinous bacteremia w/ MV veg-->60mm Mitris bioprosthetic valve, Maze, and LAA clip.   Hyperlipidemia    Hypertension    Lipoma    Mild mitral regurgitation    a. 08/2016 Echo: mild MR; b. 02/2020 Echo: Mild MR.   Neuromuscular disorder (HCC)    Obesity    Obstructive sleep apnea    PAF (paroxysmal atrial fibrillation) (HCC)    a. Dx 05/2016--> Xarelto  (CHA2DS2VASc = 3); b. 08/2016 Echo: EF 55-60%, no rwma, mild MR, mod dil LA; c. 03/2020 Zio: Avg HR 61 w/ Afib burden of 24% - max rate 189 (avg 93). 2 pauses - longest 4.7 secs post-conversion pauses (occurred @ 5:42 am); d. 04/2022 s/p DCCV; e. 08/2022 s/p DCCV.   Paroxysmal SVT (supraventricular tachycardia) (HCC)    S/P mitral valve replacement    a. 10/2021 [facility] - Endocarditis/MV Veg-->63mm Mitris bioprosthetic valve, Maze, and LAA clip.   Sleep apnea    Stroke [facility])     a. 02/2020 MRI/A: multifocal acute ischemia in R MCA territory predominantly involving post insula and R middle frontal gyrus. Nl MRA.   Tachy-brady syndrome (HCC)    Tobacco abuse     Past Surgical History:  Procedure Laterality Date   ACHILLES TENDON SURGERY Left    BACK SURGERY     2 ruptured discs in L spine, no hardware   CARDIOVERSION N/A 04/22/2022   Procedure: CARDIOVERSION;  Surgeon: Cage Deatrice LABOR, MD;  Location: [facility] ORS;  Service: Cardiovascular;  Laterality: N/A;   CARDIOVERSION N/A 08/28/2022   Procedure: CARDIOVERSION;  Surgeon: Darliss Rogue, MD;  Location: [facility] ORS;  Service: Cardiovascular;  Laterality: N/A;   KNEE ARTHROSCOPY W/ MENISCAL REPAIR Right    in the 1990s   SALPINGOOPHORECTOMY Bilateral    TUMOR REMOVAL     neck, ?lipoma   VALVE REPLACEMENT  10/04/2021     Allergies  No Known Allergies    History of Present Illness    73 y/o ? with history of paroxysmal atrial fibrillation, tachybradycardia syndrome, PSVT, hypertension, hyperlipidemia, stroke, obstructive sleep apnea on CPAP, CKD III, endocarditis s/p MVR, asthma, tobacco abuse, back pain, and diverticulitis.    Subjective  She had an episode of SVT thousand 16 in the setting of hypokalemia, thought to be due to treatment with HCTZ.  She was placed on low-dose, long-acting diltiazem  with improvement  in symptoms and this was subsequently switched to metoprolol  due to leg edema.  In January 2018, she was diagnosed with atrial fibrillation with rapid ventricular response, which converted to sinus rhythm on diltiazem .  She was placed on Xarelto  in the setting of a CHA2DS2-VASc of 3 at that time.  In October 2021, she was seen in cardiology clinic and reported an episode of presyncope associate with acute confusion and loss of fine motor skills.  She was referred to the emergency department, where head CT showed no evidence of bleeding however, focal hypodensity and loss of gray-white differentiation  in the high right frontal lobe is present concerning for acute or early subacute infarct.  MRI was performed and showed multifocal acute ischemia in the right MCA territory predominantly involving the posterior insula and right middle frontal gyrus.  MRA and carotid Dopplers were unremarkable.  EEG showed no abnormality concerning for potential seizure.  She was subsequently cleared to resume Xarelto  by neurology.  ZIO monitoring following discharge showed predominantly sinus rhythm with a 24% A-fib burden.  She was noted to have a 4.7 postconversion pause.  She was seen by electrophysiology with recommendation to start Multaq  in effort to avoid recurrent A-fib and thus postconversion pauses.   In June 2023, she was admitted to [facility] with Streptococcus sanguinous bacteremia from infected tooth and was found to have a mitral valve vegetation.  Cardiac catheterization for surgery showed mild nonobstructive CAD.  She had a prolonged hospitalization underwent mitral valve replacement with 27 mm Mitris bioprosthetic valve, Maze procedure, left atrial appendage clip.  Postoperatively, she had acute left lower extremity ischemia which required left common femoral artery exposure, thrombectomy of the left iliac, common femoral, superficial femoral, and profunda arteries as well as 4 compartment fasciotomy.  Postop echo showed EF 40 to 45%.  She also had postoperative atrial fibrillation which required cardioversion.  She was switched from Multaq  to amiodarone  during hospitalization.  Follow-up office visit in November 2023, she was found to be in atrial tachycardia versus atypical atrial flutter with rapid response.  Amiodarone  was increased to 200 mg twice daily and she remains tachycardic at follow-up office visit on April 18, 2022.  She subsequently underwent successful cardioversion on April 22, 2022 (100 J).    Tina Peters was seen in electrophysiology clinic on January 10.  She noted some fatigue but overall,  was doing well.  She was found to be in rate controlled atrial flutter.  She was subsequently scheduled for repeat echocardiogram and follow-up to discuss next steps regarding management of atrial flutter.  Follow-up lab work was relatively unremarkable.  She was in her USOH until about 10 days ago, when she was at her dtrs and she began feeling poorly w/ palpitations, fatigue, myalgias, h/a, nausea, and subjective fever/chills (afebrile on eval).  She presented to the ED on 1/27 due to ongoing symptoms.  Labs notable for K of 3.4, WBC 22.9, hsTrop 46  45. TSH/FT4 mildly elevated 5.142/1.96.  Resp panel neg.  ECG showed atrial flutter @ 102.  Presentation was felt to be consistent w/ viral illness, and she was d/c'd home.  She was seen by primary care 1/28 w/ ongoing symptoms and was managed conservatively.  Additional resp panel was neg for influenza A subtypes.  Unfortunately, she cont to feel poorly w/ ongoing hot/cold flashes, intermittent palpitations assoc w/ trembling (which she equates to going into afib), intermittent lightheadedness, anorexia, and loose/watery stools. She contacted her PCPs office on 2/3 and requested a  z-pack for ongoing symptoms.  She was advised that she will require an office visit at a minimum, but w/ ongoing symptoms, she was ultimately referred back to the ED.    Here, ECG showed sinus rhythm, 74, PACs, poor R progression, and prolonged QTc (623). Labs notable for metabolic abnormalities including Na 133, K 2.8, Cl 97, CO2 20.  Creat stable @ 1.14.  T bili 1.8, AST 53.  INR 1.6.  BNP 720.2. hsTrop 283  331 (was 46  45 06/01/2023).  Lactic acid 2.6  2.4.  CXR w/o active pulm dzs.  CT chest/abd/pelvis w/ cholelithiasis and rectosigmoid diverticulosis w/o acute inflammation.  She was treated with a 1L bolus of LR, Cefepime , metronidazole , vancomycin , and heparin .  She denies chest pain.  On telemetry, she has had paroxysmal atrial fibrillation/flutter as well as sinus  rhythm.  Inpatient Medications  Subjective    [START ON 06/11/2023] aspirin  EC  81 mg Oral Daily  Scheduled Meds:  [START ON 06/11/2023] aspirin  EC  81 mg Oral Daily   Continuous Infusions:  heparin  1,050 Units/hr (06/10/23 1243)   lactated ringers  150 mL/hr at 06/10/23 0605   PRN Meds:.   Family History    Family History  Problem Relation Age of Onset   Stroke Mother    Diabetes Mother    Fibrocystic breast disease Mother    CAD Father    Alcoholism Father    Fibrocystic breast disease Sister    Diabetes Mellitus I Grandson    Breast cancer Neg Hx    Colon cancer Neg Hx    She indicated that her mother is alive. She indicated that her father is deceased. She indicated that her sister is alive. She indicated that the status of her neg hx is unknown. She indicated that her grandson is alive.   Social History    Social History   Socioeconomic History   Marital status: Divorced    Spouse name: Not on file   Number of children: 2   Years of education: Not on file   Highest education level: Not on file  Occupational History   Occupation: retired  Tobacco Use   Smoking status: Former    Current packs/day: 0.00    Average packs/day: 0.3 packs/day for 40.0 years (10.0 ttl pk-yrs)    Types: Cigarettes    Start date: 8    Quit date: 2015    Years since quitting: 10.1   Smokeless tobacco: Never   Tobacco comments:    Used to smoke heavily, cut back in 2015  Vaping Use   Vaping status: Never Used  Substance and Sexual Activity   Alcohol use: Yes    Comment: socially, rare intake   Drug use: No   Sexual activity: Yes    Partners: Male    Birth control/protection: Post-menopausal  Other Topics Concern   Not on file  Social History Narrative   Lives alone.   Social Drivers of Health   Financial Resource Strain: Medium Risk (01/27/2023)   Overall Financial Resource Strain (CARDIA)    Difficulty of Paying Living Expenses: Somewhat hard  Food Insecurity: No Food  Insecurity (06/10/2023)   Hunger Vital Sign    Worried About Running Out of Food in the Last Year: Never true    Ran Out of Food in the Last Year: Never true  Transportation Needs: No Transportation Needs (06/10/2023)   PRAPARE - Administrator, Civil Service (Medical): No    Lack of Transportation (Non-Medical):  No  Physical Activity: Insufficiently Active (01/27/2023)   Exercise Vital Sign    Days of Exercise per Week: 1 day    Minutes of Exercise per Session: 20 min  Stress: Stress Concern Present (01/27/2023)   [employer] of Occupational Health - Occupational Stress Questionnaire    Feeling of Stress : To some extent  Social Connections: Socially Isolated (06/10/2023)   Social Connection and Isolation Panel [NHANES]    Frequency of Communication with Friends and Family: More than three times a week    Frequency of Social Gatherings with Friends and Family: Once a week    Attends Religious Services: Never    Database Administrator or Organizations: No    Attends Banker Meetings: Never    Marital Status: Divorced  Catering Manager Violence: Not At Risk (06/10/2023)   Humiliation, Afraid, Rape, and Kick questionnaire    Fear of Current or Ex-Partner: No    Emotionally Abused: No    Physically Abused: No    Sexually Abused: No     Review of Systems    General: +++ chills, +++ fever, +++ generalized malaise, no night sweats or weight changes.  Cardiovascular:  No chest pain, +++ dyspnea on exertion, no edema, orthopnea, +++ palpitations/trembling, no paroxysmal nocturnal dyspnea. Dermatological: No rash, lesions/masses Respiratory: No cough, dyspnea Urologic: No hematuria, dysuria Abdominal:   +++ nausea, +++ diarrhea +++ anorexia, no vomiting, bright red blood per rectum, melena, or hematemesis Neurologic:  No visual changes, wkns, changes in mental status. All other systems reviewed and are otherwise negative except as noted above.    Objective   Physical Exam    Blood pressure (!) 106/52, pulse (!) 59, temperature 98.2 F (36.8 C), temperature source Oral, resp. rate 18, height 5' 5 (1.651 m), weight 102 kg, SpO2 100%.  General: Pleasant, NAD Psych: Normal affect. Neuro: Alert and oriented X 3. Moves all extremities spontaneously. HEENT: Normal  Neck: Supple without bruits or JVD. Lungs:  Resp regular and unlabored, CTA. Heart: RRR no s3, s4, or murmurs. Abdomen: Soft, non-tender, non-distended, BS + x 4.  Extremities: No clubbing, cyanosis or edema. DP/PT2+, Radials 2+ and equal bilaterally.  Labs    Cardiac Enzymes Recent Labs  Lab 06/01/23 1453 06/01/23 1843 06/09/23 2153 06/10/23 0053 06/10/23 0731  TROPONINIHS 46* 45* 283* 331* 362*     BNP    Component Value Date/Time   BNP 720.2 (H) 06/09/2023 2153    Lab Results  Component Value Date   WBC 43.3 (H) 06/09/2023   HGB 10.2 (L) 06/09/2023   HCT 31.4 (L) 06/09/2023   MCV 82.2 06/09/2023   PLT 186 06/09/2023    Recent Labs  Lab 06/09/23 2153  NA 133*  K 2.8*  CL 97*  CO2 20*  BUN 17  CREATININE 1.14*  CALCIUM  8.1*  PROT 6.8  BILITOT 1.8*  ALKPHOS 107  ALT 27  AST 53*  GLUCOSE 139*   Lab Results  Component Value Date   CHOL 116 08/16/2021   HDL 39 (L) 08/16/2021   LDLCALC 61 08/16/2021   TRIG 82 08/16/2021   No results found for: [facility]    Radiology Studies    US  ABDOMEN LIMITED RUQ (LIVER/GB) Result Date: 06/10/2023 CLINICAL DATA:  [id number]. Gallstones on CT today. Patient undergoing workup for sepsis. EXAM: ULTRASOUND ABDOMEN LIMITED RIGHT UPPER QUADRANT COMPARISON:  CT earlier today. FINDINGS: Gallbladder: There is slight gallbladder dilatation. Multiple layering subcentimeter stones are noted in the proximal lumen intermixed  with sludge. There is no free wall thickening or positive sonographic Murphy's sign, no pericholecystic fluid. Common bile duct: Diameter: 1.4 mm with no intrahepatic bile duct dilatation. Liver: No focal  lesion identified. There is mild increased hepatic echogenicity consistent with steatosis. Portal vein is patent on color Doppler imaging with normal direction of blood flow towards the liver. Other: None. IMPRESSION: 1. Cholelithiasis and gallbladder sludge with slight gallbladder dilatation. No free wall thickening or positive sonographic Murphy's sign. 2. No biliary ductal dilatation. 3. Mild hepatic steatosis. Electronically Signed   By: Francis Quam M.D.   On: 06/10/2023 03:02   CT CHEST ABDOMEN PELVIS W CONTRAST Result Date: 06/10/2023 CLINICAL DATA:  Sepsis EXAM: CT CHEST, ABDOMEN, AND PELVIS WITH CONTRAST TECHNIQUE: Multidetector CT imaging of the chest, abdomen and pelvis was performed following the standard protocol during bolus administration of intravenous contrast. RADIATION DOSE REDUCTION: This exam was performed according to the departmental dose-optimization program which includes automated exposure control, adjustment of the mA and/or kV according to patient size and/or use of iterative reconstruction technique. CONTRAST:  OMNIPAQUE  IOHEXOL  300 MG/ML  SOLN COMPARISON:  None Available. FINDINGS: CT CHEST FINDINGS Cardiovascular: Mild cardiomegaly. Aortic valve replacement. Mild calcific aortic atherosclerosis. No pericardial effusion. Mediastinum/Nodes: No enlarged mediastinal, hilar, or axillary lymph nodes. Thyroid  gland, trachea, and esophagus demonstrate no significant findings. Lungs/Pleura: Mild left basilar atelectasis. Otherwise unremarkable. Musculoskeletal: No chest wall mass or suspicious bone lesions identified. CT ABDOMEN PELVIS FINDINGS Hepatobiliary: Normal hepatic contours. No intra- or extrahepatic biliary dilatation. There is cholelithiasis without acute inflammation. Pancreas: Normal pancreas. No ductal dilatation or peripancreatic fluid collection. Spleen: Normal. Adrenals/Urinary Tract: The adrenal glands are normal. No hydronephrosis, nephroureterolithiasis or solid  renal mass. The urinary bladder is normal for degree of distention Stomach/Bowel: There is no hiatal hernia. Normal duodenal course and caliber. No small bowel dilatation or inflammation. Rectosigmoid diverticulosis without acute inflammation. Normal appendix. Vascular/Lymphatic: There is calcific atherosclerosis of the abdominal aorta. No lymphadenopathy. Reproductive: Normal prostate size with symmetric seminal vesicles. Other: None. Musculoskeletal: No bony spinal canal stenosis or focal osseous abnormality. IMPRESSION: 1. No acute abnormality of the chest, abdomen or pelvis. 2. Cholelithiasis without acute inflammation 3. Rectosigmoid diverticulosis without acute inflammation. Electronically Signed   By: Franky Stanford M.D.   On: 06/10/2023 00:54   DG Chest 2 View Result Date: 06/01/2023 CLINICAL DATA:  Chest pain. EXAM: CHEST - 2 VIEW COMPARISON:  07/19/2018. FINDINGS: Bilateral lung fields are clear. Bilateral costophrenic angles are clear. Stable cardio-mediastinal silhouette. Left atrial appendage closure device, prosthetic mitral valve and sternotomy wires noted. No acute osseous abnormalities. The soft tissues are within normal limits. IMPRESSION: No active cardiopulmonary disease. Electronically Signed   By: Ree Molt M.D.   On: 06/01/2023 15:47      ECG & Cardiac Imaging    sinus rhythm, 74, PACs, poor R progression, and prolonged QTc (623) - personally reviewed.  Assessment & Plan    1.  Acute gastroenteritis/Sepsis:  Admitted w/ 10+ day h/o malaise, fatigue, subjective fever/chills, dyspnea, palpitations, and diarrhea.  She has been afebrile w/ soft BPs.  WBC 43k.  Blood/stool cultures pending.  Abx and supportive care per IM.  2.  PAF/flutter:  on amio.  Noted to be in flutter @ EP f/u in January but was in sinus on arrival here.  That said, she has had runs of flutter, fib, as well as sinus w/ freq PACs since arrival in the ED, in the setting of above.  Cont  amio.  OAC currently on  hold in setting of mild hsTrop elevation  Heparin .    3.  H/o SBE/MVR/Mitral stenosis:  had similar presentation @ time of SBE in 2023.  Echo this AM w/ EF of 50-55%, mildly reduced RV fxn, mod-sev MS w/ mean valve gradient of 11 mmHg, no MR.  MV prosthesis appeared thickened.  Endocarditis and/or pannus unable to be excluded, though veg not seen.  Plan for TEE on 2/5 @ 12:30 w/ Dr. Gollan.  After careful review of history and examination, the risks and benefits of transesophageal echocardiogram have been explained including risks of esophageal damage, perforation (1:10,000 risk), bleeding, pharyngeal hematoma as well as other potential complications associated with conscious sedation including aspiration, arrhythmia, respiratory failure and death. Alternatives to treatment were discussed, questions were answered. Patient is willing to proceed.   4.  Demand ischemia:  Mild nonobstructive CAD by cath in 2023.  In setting of above, hsTrop mildly elevated @ 283  331.  She denies c/p.  Echo w/ low-nl LV fxn w/o regional wma's.  Suspect demand ischemia.  Cont heparin  and ASA for time being.  Cont ? blocker.  5.  Hypokalemia:  K 2.8 on arrival.  Supplementation ordered. F/u K today.  6.  Prolonged QT:  initial ECG w/ marked prolongation of QTc (623) in setting of hypokalemia (2.8).  Mg 1.8.  QT improved on f/u ECG (454) last night, and more apparent U waves noted, likely due to hypokalemia.  QT does not appear prolonged on tele this AM.  F/u K.  Cont to follow QT closely in setting of metabolic abnormalities and ongoing amiodarone  therapy.  7.  CKD III:  stable.  8.  HTN:  BPs soft.  Home ARB and spiro currently on hold.  Lonni Meager, NP  06/10/2023 1:26 PM   TSH/FT4 mildly elevated 5.142/1.96 Risk Assessment/Risk Scores:     TIMI Risk Score for Unstable Angina or Non-ST Elevation MI:   The patient's TIMI risk score is 3, which indicates a 13% risk of all cause mortality, new or recurrent  myocardial infarction or need for urgent revascularization in the next 14 days.    CHA2DS2-VASc Score = 7   This indicates a 11.2% annual risk of stroke. The patient's score is based upon: CHF History: 1 HTN History: 1 Diabetes History: 0 Stroke History: 2 Vascular Disease History: 1 Age Score: 1 Gender Score: 1     Signed, Lonni Meager, NP 06/10/2023, 1:26 PM  For questions or updates, please contact   Please consult [email or web address] for contact info under Cardiology/STEMI.

## 2023-06-10 NOTE — Assessment & Plan Note (Signed)
On admission, BMI 37.42

## 2023-06-10 NOTE — Assessment & Plan Note (Signed)
On admission, continue CPAP

## 2023-06-10 NOTE — ED Notes (Signed)
Patient noted to have dirty brief. Patient cleaned and placed in new brief. Not enough stool to obtain sample.

## 2023-06-11 ENCOUNTER — Inpatient Hospital Stay: Payer: Medicare HMO | Admitting: Anesthesiology

## 2023-06-11 ENCOUNTER — Ambulatory Visit: Payer: Medicare HMO | Admitting: Cardiology

## 2023-06-11 ENCOUNTER — Inpatient Hospital Stay
Admit: 2023-06-11 | Discharge: 2023-06-11 | Disposition: A | Discharge status: Home / Self Care | Payer: Medicare HMO | Attending: Nurse Practitioner | Admitting: Nurse Practitioner

## 2023-06-11 ENCOUNTER — Encounter
Admission: EM | Disposition: A | Discharge status: Home Health | Payer: Self-pay | Source: Home / Self Care | Attending: Osteopathic Medicine

## 2023-06-11 DIAGNOSIS — I330 Acute and subacute infective endocarditis: Secondary | ICD-10-CM | POA: Diagnosis not present

## 2023-06-11 DIAGNOSIS — R7881 Bacteremia: Secondary | ICD-10-CM | POA: Diagnosis not present

## 2023-06-11 DIAGNOSIS — T826XXA Infection and inflammatory reaction due to cardiac valve prosthesis, initial encounter: Secondary | ICD-10-CM

## 2023-06-11 DIAGNOSIS — E876 Hypokalemia: Secondary | ICD-10-CM | POA: Diagnosis not present

## 2023-06-11 DIAGNOSIS — B9689 Other specified bacterial agents as the cause of diseases classified elsewhere: Secondary | ICD-10-CM

## 2023-06-11 DIAGNOSIS — I342 Nonrheumatic mitral (valve) stenosis: Secondary | ICD-10-CM

## 2023-06-11 DIAGNOSIS — K529 Noninfective gastroenteritis and colitis, unspecified: Secondary | ICD-10-CM | POA: Diagnosis not present

## 2023-06-11 DIAGNOSIS — Z9889 Other specified postprocedural states: Secondary | ICD-10-CM

## 2023-06-11 DIAGNOSIS — R6520 Severe sepsis without septic shock: Secondary | ICD-10-CM | POA: Diagnosis not present

## 2023-06-11 DIAGNOSIS — R112 Nausea with vomiting, unspecified: Secondary | ICD-10-CM

## 2023-06-11 HISTORY — PX: TEE WITHOUT CARDIOVERSION: SHX5443

## 2023-06-11 LAB — BLOOD CULTURE ID PANEL (REFLEXED) - BCID2

## 2023-06-11 LAB — C DIFFICILE QUICK SCREEN W PCR REFLEX
C Diff antigen: NEGATIVE
C Diff interpretation: NOT DETECTED
C Diff toxin: NEGATIVE

## 2023-06-11 LAB — CBC WITH DIFFERENTIAL/PLATELET
Abs Immature Granulocytes: 0.26 10*3/uL — ABNORMAL HIGH (ref 0.00–0.07)
Basophils Absolute: 0.1 10*3/uL (ref 0.0–0.1)
Basophils Relative: 0 %
Eosinophils Absolute: 0.1 10*3/uL (ref 0.0–0.5)
Eosinophils Relative: 1 %
HCT: 27.5 % — ABNORMAL LOW (ref 36.0–46.0)
Hemoglobin: 8.5 g/dL — ABNORMAL LOW (ref 12.0–15.0)
Immature Granulocytes: 2 %
Lymphocytes Relative: 10 %
Lymphs Abs: 1.7 10*3/uL (ref 0.7–4.0)
MCH: 26 pg (ref 26.0–34.0)
MCHC: 30.9 g/dL (ref 30.0–36.0)
MCV: 84.1 fL (ref 80.0–100.0)
Monocytes Absolute: 0.9 10*3/uL (ref 0.1–1.0)
Monocytes Relative: 5 %
Neutro Abs: 13.8 10*3/uL — ABNORMAL HIGH (ref 1.7–7.7)
Neutrophils Relative %: 82 %
Platelets: 160 10*3/uL (ref 150–400)
RBC: 3.27 MIL/uL — ABNORMAL LOW (ref 3.87–5.11)
RDW: 16.1 % — ABNORMAL HIGH (ref 11.5–15.5)
WBC: 16.8 10*3/uL — ABNORMAL HIGH (ref 4.0–10.5)
nRBC: 0 % (ref 0.0–0.2)

## 2023-06-11 LAB — COMPREHENSIVE METABOLIC PANEL
ALT: 19 U/L (ref 0–44)
AST: 27 U/L (ref 15–41)
Albumin: 2.4 g/dL — ABNORMAL LOW (ref 3.5–5.0)
Alkaline Phosphatase: 77 U/L (ref 38–126)
Anion gap: 7 (ref 5–15)
BUN: 13 mg/dL (ref 8–23)
CO2: 22 mmol/L (ref 22–32)
Calcium: 7.8 mg/dL — ABNORMAL LOW (ref 8.9–10.3)
Chloride: 106 mmol/L (ref 98–111)
Creatinine, Ser: 1.02 mg/dL — ABNORMAL HIGH (ref 0.44–1.00)
GFR, Estimated: 58 mL/min — ABNORMAL LOW (ref >60)
Glucose, Bld: 99 mg/dL (ref 70–99)
Potassium: 3.6 mmol/L (ref 3.5–5.1)
Sodium: 135 mmol/L (ref 135–145)
Total Bilirubin: 1 mg/dL (ref 0.0–1.2)
Total Protein: 5.7 g/dL — ABNORMAL LOW (ref 6.5–8.1)

## 2023-06-11 LAB — ECHO TEE: Area-P 1/2: 1.46 cm2

## 2023-06-11 LAB — TSH: TSH: 1.423 u[IU]/mL (ref 0.350–4.500)

## 2023-06-11 LAB — APTT
aPTT: 78 s — ABNORMAL HIGH (ref 24–36)
aPTT: 55 s — ABNORMAL HIGH (ref 24–36)

## 2023-06-11 LAB — PROCALCITONIN: Procalcitonin: 24.21 ng/mL

## 2023-06-11 SURGERY — ECHOCARDIOGRAM, TRANSESOPHAGEAL
Anesthesia: General

## 2023-06-11 MED ORDER — LIDOCAINE VISCOUS HCL 2 % MT SOLN
OROMUCOSAL | Status: AC
  Administered 2023-06-11: 15 mL via OROMUCOSAL
  Filled 2023-06-11: qty 15

## 2023-06-11 MED ORDER — METRONIDAZOLE 500 MG/100ML IV SOLN
500.0000 mg | Freq: Two times a day (BID) | INTRAVENOUS | Status: DC
  Administered 2023-06-11 – 2023-06-13 (×4): 500 mg via INTRAVENOUS
  Filled 2023-06-11 (×4): qty 100

## 2023-06-11 MED ORDER — LIDOCAINE HCL URETHRAL/MUCOSAL 2 % EX GEL
CUTANEOUS | Status: AC
  Filled 2023-06-11: qty 6

## 2023-06-11 MED ORDER — SODIUM CHLORIDE 0.9% FLUSH
3.0000 mL | Freq: Two times a day (BID) | INTRAVENOUS | Status: DC
  Administered 2023-06-11: 10 mL via INTRAVENOUS

## 2023-06-11 MED ORDER — SODIUM CHLORIDE 0.9 % IV SOLN: INTRAVENOUS | Status: DC | PRN

## 2023-06-11 MED ORDER — GERHARDT'S BUTT CREAM
TOPICAL_CREAM | Freq: Once | CUTANEOUS | Status: AC | PRN
  Filled 2023-06-11: qty 60

## 2023-06-11 MED ORDER — FENTANYL CITRATE (PF) 100 MCG/2ML IJ SOLN
INTRAMUSCULAR | Status: AC
  Filled 2023-06-11: qty 2

## 2023-06-11 MED ORDER — SODIUM CHLORIDE 0.9% FLUSH: 3.0000 mL | INTRAVENOUS | Status: DC | PRN

## 2023-06-11 MED ORDER — LIDOCAINE HCL (PF) 2 % IJ SOLN
INTRAMUSCULAR | Status: AC
  Filled 2023-06-11: qty 5

## 2023-06-11 MED ORDER — PROPOFOL 10 MG/ML IV BOLUS
INTRAVENOUS | Status: AC
  Filled 2023-06-11: qty 40

## 2023-06-11 MED ORDER — PHENYLEPHRINE 80 MCG/ML (10ML) SYRINGE FOR IV PUSH (FOR BLOOD PRESSURE SUPPORT)
PREFILLED_SYRINGE | INTRAVENOUS | Status: AC
  Filled 2023-06-11: qty 10

## 2023-06-11 MED ORDER — DEXMEDETOMIDINE HCL IN NACL 200 MCG/50ML IV SOLN
INTRAVENOUS | Status: DC | PRN
  Administered 2023-06-11: 4 ug via INTRAVENOUS

## 2023-06-11 MED ORDER — HEPARIN BOLUS VIA INFUSION
2400.0000 [IU] | Freq: Once | INTRAVENOUS | Status: AC
  Administered 2023-06-11: 2400 [IU] via INTRAVENOUS
  Filled 2023-06-11: qty 2400

## 2023-06-11 MED ORDER — EPHEDRINE 5 MG/ML INJ
INTRAVENOUS | Status: AC
  Filled 2023-06-11: qty 5

## 2023-06-11 MED ORDER — OXYCODONE HCL 5 MG PO TABS
5.0000 mg | ORAL_TABLET | ORAL | Status: DC | PRN
  Filled 2023-06-11: qty 1

## 2023-06-11 MED ORDER — PROPOFOL 500 MG/50ML IV EMUL
INTRAVENOUS | Status: DC | PRN
  Administered 2023-06-11 (×5): 50 mg via INTRAVENOUS

## 2023-06-11 MED ORDER — BUTAMBEN-TETRACAINE-BENZOCAINE 2-2-14 % EX AERO
INHALATION_SPRAY | CUTANEOUS | Status: AC
  Administered 2023-06-11: 6 via TOPICAL
  Filled 2023-06-11: qty 5

## 2023-06-11 MED ORDER — BUTAMBEN-TETRACAINE-BENZOCAINE 2-2-14 % EX AERO
6.0000 | INHALATION_SPRAY | Freq: Once | CUTANEOUS | Status: AC
  Filled 2023-06-11: qty 20

## 2023-06-11 MED ORDER — FENTANYL CITRATE (PF) 100 MCG/2ML IJ SOLN
INTRAMUSCULAR | Status: DC | PRN
  Administered 2023-06-11: 20 ug via INTRAVENOUS

## 2023-06-11 MED ORDER — PHENYLEPHRINE HCL (PRESSORS) 10 MG/ML IV SOLN
INTRAVENOUS | Status: DC | PRN
  Administered 2023-06-11: 80 ug via INTRAVENOUS
  Administered 2023-06-11 (×2): 100 ug via INTRAVENOUS

## 2023-06-11 MED ORDER — LIDOCAINE HCL (CARDIAC) PF 100 MG/5ML IV SOSY
PREFILLED_SYRINGE | INTRAVENOUS | Status: DC | PRN
  Administered 2023-06-11: 50 mg via INTRAVENOUS

## 2023-06-11 MED ORDER — METOPROLOL SUCCINATE ER 25 MG PO TB24
25.0000 mg | ORAL_TABLET | Freq: Every day | ORAL | Status: DC
  Filled 2023-06-11: qty 1

## 2023-06-11 MED ORDER — LIDOCAINE VISCOUS HCL 2 % MT SOLN
15.0000 mL | Freq: Once | OROMUCOSAL | Status: AC
  Filled 2023-06-11: qty 15

## 2023-06-11 MED ORDER — EPHEDRINE SULFATE-NACL 50-0.9 MG/10ML-% IV SOSY
PREFILLED_SYRINGE | INTRAVENOUS | Status: DC | PRN
  Administered 2023-06-11 (×2): 10 mg via INTRAVENOUS

## 2023-06-11 NOTE — Progress Notes (Signed)
 Transesophageal Echocardiogram :  Indication: Bacteremia, rule out endocarditis Requesting/ordering  physician: Dr. Marsa  Procedure: Benzocaine  spray x2 and 2 mls x 2 of viscous lidocaine  were given orally to provide local anesthesia to the oropharynx. The patient was positioned supine on the left side, bite block provided. The patient was given general sedation with propofol  per anesthesia team.  using digital technique an omniplane probe was advanced into the distal esophagus without incident.   Moderate sedation: 1. Sedation used: Sedation per anesthesia team  See report in EPIC  for complete details: In brief,  Thickening of posterior leaflet mitral valve prosthetic valve with mobile vegetation noted concerning for vegetation/endocarditis Mild aortic valve stenosis, mean gradient 7 mmHg  transgastric imaging revealed normal LV function with no RWMAs and no mural apical thrombus.  .  Estimated ejection fraction was 55%.  Right sided cardiac chambers were normal with no evidence of pulmonary hypertension.  The LA was well visualized in orthogonal views.  There was no spontaneous contrast and no thrombus in the LA and LA appendage   The descending thoracic aorta had no  mural aortic debris with no evidence of aneurysmal dilation or disection  Imaging of the septum showed no ASD or VSD Unable to perform bubble study given respiratory distress during procedure with hypoxia requiring termination of the case   Moncerrath Berhe 06/11/2023 3:22 PM

## 2023-06-11 NOTE — Progress Notes (Signed)
 Rounding Note    Patient Name: Tina Peters Date of Encounter: 06/11/2023  [facility] Cardiologist: Deatrice Cage, MD   Subjective   Discussed recent events, presenting with fatigue, general malaise, chills fever palpitations leading to ER evaluation Elevated WBC, blood cultures pending, gram-positive cocci TEE today showing mobile opacity on posterior leaflet mitral valve prosthetic valve concerning for endocarditis On broad-spectrum antibiotics, ceftriaxone , Flagyl    Inpatient Medications    Scheduled Meds:  [MAR Hold] amiodarone   200 mg Oral Daily   [MAR Hold] aspirin  EC  81 mg Oral Daily   [MAR Hold] metoprolol  succinate  25 mg Oral Daily   [MAR Hold] metroNIDAZOLE   500 mg Oral Q12H   [MAR Hold] sertraline   50 mg Oral Daily   [MAR Hold] sodium chloride  flush  3-10 mL Intravenous Q12H   Continuous Infusions:  [MAR Hold] cefTRIAXone  (ROCEPHIN )  IV 2 g (06/11/23 0001)   heparin  1,600 Units/hr (06/11/23 1351)   PRN Meds: [MAR Hold] acetaminophen  **OR** [MAR Hold] acetaminophen , [MAR Hold] ondansetron  **OR** [MAR Hold] ondansetron  (ZOFRAN ) IV, [MAR Hold] sodium chloride  flush   Vital Signs    Vitals:   06/11/23 1330 06/11/23 1345 06/11/23 1359 06/11/23 1400  BP: (!) 101/49 (!) 104/43 101/67 (!) 113/49  Pulse: 60     Resp: (!) 30 (!) 36    Temp:      TempSrc:      SpO2: 99% 99% 97%   Weight:      Height:        Intake/Output Summary (Last 24 hours) at 06/11/2023 1529 Last data filed at 06/11/2023 1311 Gross per 24 hour  Intake 1201.5 ml  Output --  Net 1201.5 ml      06/11/2023   12:05 PM 06/09/2023    9:35 PM 06/03/2023    2:17 PM  Last 3 Weights  Weight (lbs) 224 lb 13.9 oz 224 lb 13.9 oz 226 lb  Weight (kg) 102 kg 102 kg 102.513 kg      Telemetry    Normal sinus rhythm- Personally Reviewed  ECG     - Personally Reviewed  Physical Exam   GEN: No acute distress.  Obese Neck: No JVD Cardiac: RRR, no murmurs, rubs, or gallops.   Respiratory: Clear to auscultation bilaterally. GI: Soft, nontender, non-distended  MS: No edema; No deformity. Neuro:  Nonfocal  Psych: Normal affect   Labs    High Sensitivity Troponin:   Recent Labs  Lab 06/01/23 1843 06/09/23 2153 06/10/23 0053 06/10/23 0731 06/10/23 1531  TROPONINIHS 45* 283* 331* 362* 228*     Chemistry Recent Labs  Lab 06/09/23 2153 06/10/23 1531 06/11/23 0635  NA 133* 137 135  K 2.8* 3.7 3.6  CL 97* 103 106  CO2 20* 24 22  GLUCOSE 139* 130* 99  BUN 17 15 13   CREATININE 1.14* 1.12* 1.02*  CALCIUM  8.1* 7.9* 7.8*  MG 1.8 2.3  --   PROT 6.8  --  5.7*  ALBUMIN 2.9*  --  2.4*  AST 53*  --  27  ALT 27  --  19  ALKPHOS 107  --  77  BILITOT 1.8*  --  1.0  GFRNONAA 51* 52* 58*  ANIONGAP 16* 10 7    Lipids No results for input(s): CHOL, TRIG, HDL, LABVLDL, LDLCALC, CHOLHDL in the last 168 hours.  Hematology Recent Labs  Lab 06/09/23 2153 06/10/23 1531 06/11/23 0635  WBC 43.3* 22.2* 16.8*  RBC 3.82* 3.50* 3.27*  HGB 10.2* 9.4* 8.5*  HCT 31.4* 29.7* 27.5*  MCV 82.2 84.9 84.1  MCH 26.7 26.9 26.0  MCHC 32.5 31.6 30.9  RDW 15.9* 15.9* 16.1*  PLT 186 178 160   Thyroid   Recent Labs  Lab 06/10/23 1553 06/10/23 2253  TSH 0.890 1.423  FREET4 2.01*  --     BNP Recent Labs  Lab 06/09/23 2153  BNP 720.2*    DDimer No results for input(s): DDIMER in the last 168 hours.   Radiology    ECHOCARDIOGRAM COMPLETE Result Date: 06/10/2023    ECHOCARDIOGRAM REPORT   Patient Name:   Tina Peters Date of Exam: 06/10/2023 Medical Rec #:  [id number]       Height:       65.0 in Accession #:    [id number]      Weight:       224.9 lb Date of Birth:  Jan 09, 1951       BSA:          2.079 m Patient Age:    72 years        BP:           97/55 mmHg Patient Gender: F               HR:           72 bpm. Exam Location:  [facility] Procedure: 2D Echo, Cardiac Doppler and Color Doppler Indications:     Chest pain R07.9  History:         Patient has prior  history of Echocardiogram examinations, most                  recent 07/24/2022. Risk Factors:Hypertension and Dyslipidemia.                  S/P Bovine mitral valve replacement.  Sonographer:     Christopher Furnace Referring Phys:  [id number] CHRISTOPHER END Diagnosing Phys: Lonni Hanson MD IMPRESSIONS  1. Left ventricular ejection fraction, by estimation, is 50 to 55%. The left ventricle has low normal function. The left ventricle has no regional wall motion abnormalities. Left ventricular diastolic function could not be evaluated.  2. Right ventricular systolic function is mildly reduced. The right ventricular size is mildly enlarged.  3. Left atrial size was severely dilated.  4. Right atrial size was mildly dilated.  5. The mitral valve has been repaired/replaced. No evidence of mitral valve regurgitation. Moderate to severe mitral stenosis. The mean mitral valve gradient is 11.0 mmHg.  6. Tricuspid valve regurgitation is mild to moderate.  7. The aortic valve is tricuspid. There is mild thickening of the aortic valve. Aortic valve regurgitation is not visualized. Aortic valve sclerosis/calcification is present, without any evidence of aortic stenosis. Conclusion(s)/Recommendation(s): Mitral valve prosthesis appears thickened with increased mean gradient since last echo shortly after implantation in 10/2021 at [facility] (mean gratient 5 -> 11 mmHg). Endocarditis and/or pannus formation cannot be excluded, though a mobile vegetation is not clearly seen. Consider further evaluation by transesophageal echocardiogram. FINDINGS  Left Ventricle: Left ventricular ejection fraction, by estimation, is 50 to 55%. The left ventricle has low normal function. The left ventricle has no regional wall motion abnormalities. The left ventricular internal cavity size was normal in size. There is borderline left ventricular hypertrophy. Left ventricular diastolic function could not be evaluated due to mitral valve replacement. Left ventricular  diastolic function could not be evaluated. Right Ventricle: The right ventricular size is mildly enlarged. No increase in right ventricular wall thickness. Right ventricular  systolic function is mildly reduced. Left Atrium: Left atrial size was severely dilated. Right Atrium: Right atrial size was mildly dilated. Pericardium: There is no evidence of pericardial effusion. Mitral Valve: The mitral valve has been repaired/replaced. No evidence of mitral valve regurgitation. There is a 27 mm Mitris bioprosthetic valve present in the mitral position. Moderate to severe mitral valve stenosis. MV peak gradient, 25.8 mmHg. The mean mitral valve gradient is 11.0 mmHg. Tricuspid Valve: The tricuspid valve is normal in structure. Tricuspid valve regurgitation is mild to moderate. Aortic Valve: The aortic valve is tricuspid. There is mild thickening of the aortic valve. There is mild aortic valve annular calcification. Aortic valve regurgitation is not visualized. Aortic valve sclerosis/calcification is present, without any evidence of aortic stenosis. Aortic valve mean gradient measures 5.0 mmHg. Aortic valve peak gradient measures 10.1 mmHg. Aortic valve area, by VTI measures 1.53 cm. Pulmonic Valve: The pulmonic valve was normal in structure. Pulmonic valve regurgitation is mild to moderate. No evidence of pulmonic stenosis. Aorta: The aortic root is normal in size and structure. Pulmonary Artery: The pulmonary artery is of normal size. IAS/Shunts: No atrial level shunt detected by color flow Doppler.  LEFT VENTRICLE PLAX 2D LVIDd:         5.00 cm LVIDs:         3.20 cm LV PW:         1.10 cm LV IVS:        1.00 cm LVOT diam:     2.00 cm LV SV:         45 LV SV Index:   22 LVOT Area:     3.14 cm  RIGHT VENTRICLE RV Basal diam:  4.30 cm RV Mid diam:    3.60 cm RV S prime:     11.00 cm/s TAPSE (M-mode): 2.0 cm LEFT ATRIUM              Index        RIGHT ATRIUM           Index LA diam:        4.60 cm  2.21 cm/m   RA Area:      23.30 cm LA Vol (A2C):   107.0 ml 51.46 ml/m  RA Volume:   73.70 ml  35.45 ml/m LA Vol (A4C):   119.0 ml 57.23 ml/m LA Biplane Vol: 114.0 ml 54.83 ml/m  AORTIC VALVE                     PULMONIC VALVE AV Area (Vmax):    1.55 cm      PR End Diast Vel: 11.16 msec AV Area (Vmean):   1.62 cm AV Area (VTI):     1.53 cm AV Vmax:           158.67 cm/s AV Vmean:          103.633 cm/s AV VTI:            0.293 m AV Peak Grad:      10.1 mmHg AV Mean Grad:      5.0 mmHg LVOT Vmax:         78.20 cm/s LVOT Vmean:        53.300 cm/s LVOT VTI:          0.143 m LVOT/AV VTI ratio: 0.49  AORTA Ao Root diam: 3.00 cm MITRAL VALVE                TRICUSPID VALVE MV  Area (PHT): 1.26 cm     TR Peak grad:   35.5 mmHg MV Area VTI:   0.52 cm     TR Vmax:        298.00 cm/s MV Peak grad:  25.8 mmHg MV Mean grad:  11.0 mmHg    SHUNTS MV Vmax:       2.54 m/s     Systemic VTI:  0.14 m MV Vmean:      152.0 cm/s   Systemic Diam: 2.00 cm MV Decel Time: 604 msec MV E velocity: 188.00 cm/s Lonni Hanson MD Electronically signed by Lonni Hanson MD Signature Date/Time: 06/10/2023/10:36:59 AM    Final    US  ABDOMEN LIMITED RUQ (LIVER/GB) Result Date: 06/10/2023 CLINICAL DATA:  [id number]. Gallstones on CT today. Patient undergoing workup for sepsis. EXAM: ULTRASOUND ABDOMEN LIMITED RIGHT UPPER QUADRANT COMPARISON:  CT earlier today. FINDINGS: Gallbladder: There is slight gallbladder dilatation. Multiple layering subcentimeter stones are noted in the proximal lumen intermixed with sludge. There is no free wall thickening or positive sonographic Murphy's sign, no pericholecystic fluid. Common bile duct: Diameter: 1.4 mm with no intrahepatic bile duct dilatation. Liver: No focal lesion identified. There is mild increased hepatic echogenicity consistent with steatosis. Portal vein is patent on color Doppler imaging with normal direction of blood flow towards the liver. Other: None. IMPRESSION: 1. Cholelithiasis and gallbladder sludge with slight  gallbladder dilatation. No free wall thickening or positive sonographic Murphy's sign. 2. No biliary ductal dilatation. 3. Mild hepatic steatosis. Electronically Signed   By: Francis Quam M.D.   On: 06/10/2023 03:02   CT CHEST ABDOMEN PELVIS W CONTRAST Result Date: 06/10/2023 CLINICAL DATA:  Sepsis EXAM: CT CHEST, ABDOMEN, AND PELVIS WITH CONTRAST TECHNIQUE: Multidetector CT imaging of the chest, abdomen and pelvis was performed following the standard protocol during bolus administration of intravenous contrast. RADIATION DOSE REDUCTION: This exam was performed according to the departmental dose-optimization program which includes automated exposure control, adjustment of the mA and/or kV according to patient size and/or use of iterative reconstruction technique. CONTRAST:  100mL OMNIPAQUE  IOHEXOL  300 MG/ML  SOLN COMPARISON:  None Available. FINDINGS: CT CHEST FINDINGS Cardiovascular: Mild cardiomegaly. Aortic valve replacement. Mild calcific aortic atherosclerosis. No pericardial effusion. Mediastinum/Nodes: No enlarged mediastinal, hilar, or axillary lymph nodes. Thyroid  gland, trachea, and esophagus demonstrate no significant findings. Lungs/Pleura: Mild left basilar atelectasis. Otherwise unremarkable. Musculoskeletal: No chest wall mass or suspicious bone lesions identified. CT ABDOMEN PELVIS FINDINGS Hepatobiliary: Normal hepatic contours. No intra- or extrahepatic biliary dilatation. There is cholelithiasis without acute inflammation. Pancreas: Normal pancreas. No ductal dilatation or peripancreatic fluid collection. Spleen: Normal. Adrenals/Urinary Tract: The adrenal glands are normal. No hydronephrosis, nephroureterolithiasis or solid renal mass. The urinary bladder is normal for degree of distention Stomach/Bowel: There is no hiatal hernia. Normal duodenal course and caliber. No small bowel dilatation or inflammation. Rectosigmoid diverticulosis without acute inflammation. Normal appendix.  Vascular/Lymphatic: There is calcific atherosclerosis of the abdominal aorta. No lymphadenopathy. Reproductive: Normal prostate size with symmetric seminal vesicles. Other: None. Musculoskeletal: No bony spinal canal stenosis or focal osseous abnormality. IMPRESSION: 1. No acute abnormality of the chest, abdomen or pelvis. 2. Cholelithiasis without acute inflammation 3. Rectosigmoid diverticulosis without acute inflammation. Electronically Signed   By: Franky Stanford M.D.   On: 06/10/2023 00:54    Cardiac Studies   Transesophageal echo Normal LV function, thickening posterior leaflet mitral valve prosthesis, mobile opacity noted concerning for endocarditis  Patient Profile     Tina Peters is a 73  y.o. 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 admitted to [facility] Streptococcus sanguinous bacteremia from infected tooth with mitral valve vegetation Underwent mitral valve replacement with bioprosthetic valve Presenting with chills fever malaise diarrhea -Transthoracic echo with thickening of mitral valve, unable to exclude endocarditis -Transesophageal echo performed with thickening mitral valve concern for mobile vegetation consistent with endocarditis -Blood cultures positive, gram-positive, high details pending -ID aware, on broad-spectrum antibiotics -Recommendation for PICC line -Infection likely caught relatively early given no significant valve regurgitation, grossly no abscess formation though will need to be monitored closely -For any worsening valve damage, will need CT surgery consultation  Acute gastroenteritis, sepsis Plan as  above WBC 43 Fevers, chills, shortness of breath, palpitations, diarrhea  Paroxysmal atrial fibrillation/flutter Was in atrial flutter January 2025, Sinus this admission, continue amiodarone , heparin  infusion  Elevated troponin Demand ischemia in the setting of bacteremia No focal wall motion abnormalities noted Continue heparin  infusion, aspirin , beta-blocker  Hypokalemia Secondary to diarrhea Repleted, running low normal  For questions or updates, please contact [facility] Please consult [email or web address] for contact info under        Signed, Tayllor Breitenstein, MD  06/11/2023, 3:29 PM

## 2023-06-11 NOTE — Transfer of Care (Signed)
 Immediate Anesthesia Transfer of Care Note  Patient: Tina Peters  Procedure(s) Performed: TRANSESOPHAGEAL ECHOCARDIOGRAM (TEE)  Patient Location: PACU  Anesthesia Type:MAC  Level of Consciousness: awake and alert   Airway & Oxygen Therapy: Patient Spontanous Breathing and Patient connected to nasal cannula oxygen  Post-op Assessment: Report given to RN and Post -op Vital signs reviewed and stable  Post vital signs: stable  Last Vitals:  Vitals Value Taken Time  BP 95/59 06/11/23 1310  Temp 98.2   Pulse 67 06/11/23 1312  Resp 25 06/11/23 1312  SpO2 92 % 06/11/23 1312    Last Pain:  Vitals:   06/11/23 1205  TempSrc: Oral  PainSc: 0-No pain      Patients Stated Pain Goal: 0 (06/10/23 2323)  Complications: No notable events documented.

## 2023-06-11 NOTE — Consult Note (Signed)
 NAME: Tina Peters  DOB: June 25, 1950  MRN: [id number]  Date/Time: 06/11/2023 2:39 PM  REQUESTING PROVIDER: Dr.Alexander Subjective:  REASON FOR CONSULT: bacteremia ? Tina Peters is a 73 y.o. 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  Temp 98.3 F (36.8 C)  Pulse Rate 58 !  Resp 25 !  SpO2 100 %    Latest Reference Range & Units 06/09/23  WBC 4.0 - 10.5 K/uL 43.3 (H)  Hemoglobin 12.0 - 15.0 g/dL 89.7 (L)  HCT 63.9 - 53.9 % 31.4 (L)  Platelets 150 - 400 K/uL 186  Creatinine 0.44 - 1.00 mg/dL 8.85 (H)   Blood culture sent was gram positive cocci in anaerobic bottle ID pending He had 2 d echo - has mitral valve endocarditis TEE was done today and there is vegetation on the posterior cusp of the bovine mitral valve I am asked to see her for bacteremia and endocarditis Pt c/o pain left leg and also between shoulders Has diarrhea  Past Medical History:  Diagnosis Date   Allergy    Anxiety    Arthritis    Asthma    Chronic back pain    Chronic combined systolic (congestive) and diastolic (congestive) heart failure (HCC)    a. 02/2020 Echo: EF 60-65%, no rwma. Gr2 DD. Nl RV size/fxn. Mild LAE. Mild MR; b. 10/2021 Echo: [facility] following MVR) EF 40 to 45%.   Depression    Diverticulitis    Endocarditis    a. 10/2021 S sanguinous bacteremia w/ MV veg-->22mm Mitris bioprosthetic valve, Maze, and LAA clip.   Hyperlipidemia    Hypertension    Lipoma    Mild mitral regurgitation     a. 08/2016 Echo: mild MR; b. 02/2020 Echo: Mild MR.   Neuromuscular disorder (HCC)    Obesity    Obstructive sleep apnea    PAF (paroxysmal atrial fibrillation) (HCC)    a. Dx 05/2016--> Xarelto  (CHA2DS2VASc = 3); b. 08/2016 Echo: EF 55-60%, no rwma, mild MR, mod dil LA; c. 03/2020 Zio: Avg HR 61 w/ Afib burden of 24% - max rate 189 (avg 93). 2 pauses - longest 4.7 secs post-conversion pauses (occurred @ 5:42 am); d. 04/2022 s/p DCCV; e. 08/2022 s/p DCCV.   Paroxysmal SVT (supraventricular tachycardia) (HCC)    S/P mitral valve replacement    a. 10/2021 [facility] - Endocarditis/MV Veg-->66mm Mitris bioprosthetic valve, Maze, and LAA clip.   Sleep apnea    Stroke [facility])    a. 02/2020 MRI/A: multifocal acute ischemia in R MCA territory predominantly involving post insula and R middle frontal gyrus. Nl MRA.   Tachy-brady syndrome (HCC)    Tobacco abuse     Past Surgical History:  Procedure Laterality Date   ACHILLES TENDON SURGERY Left    BACK SURGERY     2 ruptured discs in L spine, no hardware   CARDIOVERSION N/A 04/22/2022   Procedure: CARDIOVERSION;  Surgeon: Darron Deatrice LABOR, MD;  Location: [facility] ORS;  Service: Cardiovascular;  Laterality: N/A;  CARDIOVERSION N/A 08/28/2022   Procedure: CARDIOVERSION;  Surgeon: Darliss Rogue, MD;  Location: [facility] ORS;  Service: Cardiovascular;  Laterality: N/A;   KNEE ARTHROSCOPY W/ MENISCAL REPAIR Right    in the 1990s   SALPINGOOPHORECTOMY Bilateral    TUMOR REMOVAL     neck, ?lipoma   VALVE REPLACEMENT  10/04/2021    Social History   Socioeconomic History   Marital status: Divorced    Spouse name: Not on file   Number of children: 2   Years of education: Not on file   Highest education level: Not on file  Occupational History   Occupation: retired  Tobacco Use   Smoking status: Former    Current packs/day: 0.00    Average packs/day: 0.3 packs/day for 40.0 years (10.0 ttl pk-yrs)    Types: Cigarettes    Start date: 42    Quit date: 2015     Years since quitting: 10.1   Smokeless tobacco: Never   Tobacco comments:    Used to smoke heavily, cut back in 2015  Vaping Use   Vaping status: Never Used  Substance and Sexual Activity   Alcohol use: Yes    Comment: socially, rare intake   Drug use: No   Sexual activity: Yes    Partners: Male    Birth control/protection: Post-menopausal  Other Topics Concern   Not on file  Social History Narrative   Lives alone.   Social Drivers of Health   Financial Resource Strain: Medium Risk (01/27/2023)   Overall Financial Resource Strain (CARDIA)    Difficulty of Paying Living Expenses: Somewhat hard  Food Insecurity: No Food Insecurity (06/10/2023)   Hunger Vital Sign    Worried About Running Out of Food in the Last Year: Never true    Ran Out of Food in the Last Year: Never true  Transportation Needs: No Transportation Needs (06/10/2023)   PRAPARE - Administrator, Civil Service (Medical): No    Lack of Transportation (Non-Medical): No  Physical Activity: Insufficiently Active (01/27/2023)   Exercise Vital Sign    Days of Exercise per Week: 1 day    Minutes of Exercise per Session: 20 min  Stress: Stress Concern Present (01/27/2023)   [employer] of Occupational Health - Occupational Stress Questionnaire    Feeling of Stress : To some extent  Social Connections: Socially Isolated (06/10/2023)   Social Connection and Isolation Panel [NHANES]    Frequency of Communication with Friends and Family: More than three times a week    Frequency of Social Gatherings with Friends and Family: Once a week    Attends Religious Services: Never    Database Administrator or Organizations: No    Attends Banker Meetings: Never    Marital Status: Divorced  Catering Manager Violence: Not At Risk (06/10/2023)   Humiliation, Afraid, Rape, and Kick questionnaire    Fear of Current or Ex-Partner: No    Emotionally Abused: No    Physically Abused: No    Sexually Abused: No     Family History  Problem Relation Age of Onset   Stroke Mother    Diabetes Mother    Fibrocystic breast disease Mother    CAD Father    Alcoholism Father    Fibrocystic breast disease Sister    Diabetes Mellitus I Grandson    Breast cancer Neg Hx    Colon cancer Neg Hx    No Known Allergies I? Current Facility-Administered Medications  Medication Dose Route  Frequency Provider Last Rate Last Admin   [MAR Hold] acetaminophen  (TYLENOL ) tablet 650 mg  650 mg Oral Q6H PRN Laurence Locus, DO   650 mg at 06/10/23 2343   Or   [MAR Hold] acetaminophen  (TYLENOL ) suppository 650 mg  650 mg Rectal Q6H PRN Laurence Locus, DO       [MAR Hold] amiodarone  (PACERONE ) tablet 200 mg  200 mg Oral Daily End, Christopher, MD   200 mg at 06/11/23 0940   [MAR Hold] aspirin  EC tablet 81 mg  81 mg Oral Daily Laurence Locus, DO   81 mg at 06/11/23 0940   butamben -tetracaine -benzocaine  (CETACAINE ) 06-07-12 % spray            [MAR Hold] cefTRIAXone  (ROCEPHIN ) 2 g in sodium chloride  0.9 % 100 mL IVPB  2 g Intravenous Q24H Laurence Locus, DO 200 mL/hr at 06/11/23 0001 2 g at 06/11/23 0001   heparin  ADULT infusion 100 units/mL (25000 units/250mL)  1,600 Units/hr Intravenous Continuous Dail Rankin RAMAN, RPH 16 mL/hr at 06/11/23 1351 1,600 Units/hr at 06/11/23 1351   lidocaine  (XYLOCAINE ) 2 % viscous mouth solution            [MAR Hold] metoprolol  succinate (TOPROL -XL) 24 hr tablet 25 mg  25 mg Oral Daily Alexander, Natalie, DO       [facility] Hold] metroNIDAZOLE  (FLAGYL ) tablet 500 mg  500 mg Oral Q12H Laurence Locus, DO   500 mg at 06/11/23 0940   [MAR Hold] ondansetron  (ZOFRAN ) tablet 4 mg  4 mg Oral Q6H PRN Laurence Locus, DO       Or   ILDA Hold] ondansetron  (ZOFRAN ) injection 4 mg  4 mg Intravenous Q6H PRN Laurence Locus, DO       [facility] Hold] sertraline  (ZOLOFT ) tablet 50 mg  50 mg Oral Daily Laurence Locus, DO   50 mg at 06/11/23 0941   [facility] Hold] sodium chloride  flush (NS) 0.9 % injection 3-10 mL  3-10 mL Intravenous Q12H Vivienne Lonni Ingle, NP   10 mL at 06/11/23 1202   [MAR Hold] sodium chloride  flush (NS) 0.9 % injection 3-10 mL  3-10 mL Intravenous PRN Vivienne Lonni Ingle, NP         Abtx:  Anti-infectives (From admission, onward)    Start     Dose/Rate Route Frequency Ordered Stop   06/11/23 0015  [MAR Hold]  cefTRIAXone  (ROCEPHIN ) 2 g in sodium chloride  0.9 % 100 mL IVPB        (MAR Hold since Wed 06/11/2023 at 1205.Hold Reason: Transfer to a Procedural area)   2 g 200 mL/hr over 30 Minutes Intravenous Every 24 hours 06/10/23 2320     06/11/23 0000  [MAR Hold]  metroNIDAZOLE  (FLAGYL ) tablet 500 mg        (MAR Hold since Wed 06/11/2023 at 1205.Hold Reason: Transfer to a Procedural area)   500 mg Oral Every 12 hours 06/10/23 2320     06/09/23 2245  ceFEPIme  (MAXIPIME ) 2 g in sodium chloride  0.9 % 100 mL IVPB        2 g 200 mL/hr over 30 Minutes Intravenous  Once 06/09/23 2238 06/09/23 2324   06/09/23 2245  metroNIDAZOLE  (FLAGYL ) IVPB 500 mg        500 mg 100 mL/hr over 60 Minutes Intravenous  Once 06/09/23 2238 06/10/23 0035   06/09/23 2245  vancomycin  (VANCOCIN ) IVPB 1000 mg/200 mL premix        1,000 mg 200 mL/hr over 60 Minutes Intravenous  Once 06/09/23 2238  06/10/23 0035       REVIEW OF SYSTEMS:  Const: negative fever, negative chills, negative weight loss Eyes: negative diplopia or visual changes, negative eye pain ENT: negative coryza, negative sore throat Resp: negative cough, hemoptysis, dyspnea Cards: negative for chest pain, palpitations, lower extremity edema GU: negative for frequency, dysuria and hematuria GI: Negative for abdominal pain, has diarrhea, has vomiting Skin: negative for rash and pruritus Heme: negative for easy bruising and gum/nose bleeding MS: weakness, back pain between shoulders Neurolo:negative for headaches, dizziness, vertigo, memory problems  Psych: negative for feelings of anxiety, depression  Endocrine: negative for thyroid , diabetes Allergy/Immunology- negative  for any medication or food allergies ?  Objective:  VITALS:  BP (!) 113/49   Pulse 60   Temp 98.2 F (36.8 C) (Oral)   Resp (!) 36   Ht 5' 5 (1.651 m)   Wt 102 kg   SpO2 97%   BMI 37.42 kg/m  LDA Foley Central line Other drainage tubes PHYSICAL EXAM:  General: Alert, cooperative, no distress, appears stated age.  Head: Normocephalic, without obvious abnormality, atraumatic. Eyes: Conjunctivae clear, anicteric sclerae. Pupils are equal ENT Nares normal. No drainage or sinus tenderness. Lips, mucosa, and tongue normal. No Thrush Neck: Supple, symmetrical, no adenopathy, thyroid : non tender no carotid bruit and no JVD. Back: No CVA tenderness. Lungs: Clear to auscultation bilaterally. No Wheezing or Rhonchi. No rales. Heart: Regular rate and rhythm, no murmur, rub or gallop. Abdomen: Soft, non-tender,not distended. Bowel sounds normal. No masses Extremities: atraumatic, no cyanosis. No edema. No clubbing Skin: No rashes or lesions. Or bruising Lymph: Cervical, supraclavicular normal. Neurologic: Grossly non-focal Pertinent Labs Lab Results CBC    Component Value Date/Time   WBC 16.8 (H) 06/11/2023 0635   RBC 3.27 (L) 06/11/2023 0635   HGB 8.5 (L) 06/11/2023 0635   HGB 11.9 05/16/2023 1350   HCT 27.5 (L) 06/11/2023 0635   HCT 38.5 05/16/2023 1350   PLT 160 06/11/2023 0635   PLT 254 05/16/2023 1350   MCV 84.1 06/11/2023 0635   MCV 86 05/16/2023 1350   MCH 26.0 06/11/2023 0635   MCHC 30.9 06/11/2023 0635   RDW 16.1 (H) 06/11/2023 0635   RDW 14.4 05/16/2023 1350   LYMPHSABS 1.7 06/11/2023 0635   LYMPHSABS 2.6 11/20/2021 1117   MONOABS 0.9 06/11/2023 0635   EOSABS 0.1 06/11/2023 0635   EOSABS 0.1 11/20/2021 1117   BASOSABS 0.1 06/11/2023 0635   BASOSABS 0.0 11/20/2021 1117       Latest Ref Rng & Units 06/11/2023    6:35 AM 06/10/2023    3:31 PM 06/09/2023    9:53 PM  CMP  Glucose 70 - 99 mg/dL 99  869  860   BUN 8 - 23 mg/dL 13  15  17    Creatinine 0.44 -  1.00 mg/dL 8.97  8.87  8.85   Sodium 135 - 145 mmol/L 135  137  133   Potassium 3.5 - 5.1 mmol/L 3.6  3.7  2.8   Chloride 98 - 111 mmol/L 106  103  97   CO2 22 - 32 mmol/L 22  24  20    Calcium  8.9 - 10.3 mg/dL 7.8  7.9  8.1   Total Protein 6.5 - 8.1 g/dL 5.7   6.8   Total Bilirubin 0.0 - 1.2 mg/dL 1.0   1.8   Alkaline Phos 38 - 126 U/L 77   107   AST 15 - 41 U/L 27   53   ALT  0 - 44 U/L 19   27       Microbiology: Recent Results (from the past 240 hours)  Resp panel by RT-PCR (RSV, Flu A&B, Covid) Anterior Nasal Swab     Status: None   Collection Time: 06/01/23  2:52 PM   Specimen: Anterior Nasal Swab  Result Value Ref Range Status   SARS Coronavirus 2 by RT PCR NEGATIVE NEGATIVE Final    Comment: (NOTE) SARS-CoV-2 target nucleic acids are NOT DETECTED.  The SARS-CoV-2 RNA is generally detectable in upper respiratory specimens during the acute phase of infection. The lowest concentration of SARS-CoV-2 viral copies this assay can detect is 138 copies/mL. A negative result does not preclude SARS-Cov-2 infection and should not be used as the sole basis for treatment or other patient management decisions. A negative result may occur with  improper specimen collection/handling, submission of specimen other than nasopharyngeal swab, presence of viral mutation(s) within the areas targeted by this assay, and inadequate number of viral copies(<138 copies/mL). A negative result must be combined with clinical observations, patient history, and epidemiological information. The expected result is Negative.  Fact Sheet for Patients:  bloggercourse.com  Fact Sheet for Healthcare Providers:  seriousbroker.it  This test is no t yet approved or cleared by the [place]  FDA and  has been authorized for detection and/or diagnosis of SARS-CoV-2 by FDA under an Emergency Use Authorization (EUA). This EUA will remain  in effect (meaning  this test can be used) for the duration of the COVID-19 declaration under Section 564(b)(1) of the Act, 21 U.S.C.section 360bbb-3(b)(1), unless the authorization is terminated  or revoked sooner.       Influenza A by PCR NEGATIVE NEGATIVE Final   Influenza B by PCR NEGATIVE NEGATIVE Final    Comment: (NOTE) The Xpert Xpress SARS-CoV-2/FLU/RSV plus assay is intended as an aid in the diagnosis of influenza from Nasopharyngeal swab specimens and should not be used as a sole basis for treatment. Nasal washings and aspirates are unacceptable for Xpert Xpress SARS-CoV-2/FLU/RSV testing.  Fact Sheet for Patients: bloggercourse.com  Fact Sheet for Healthcare Providers: seriousbroker.it  This test is not yet approved or cleared by the [place]  FDA and has been authorized for detection and/or diagnosis of SARS-CoV-2 by FDA under an Emergency Use Authorization (EUA). This EUA will remain in effect (meaning this test can be used) for the duration of the COVID-19 declaration under Section 564(b)(1) of the Act, 21 U.S.C. section 360bbb-3(b)(1), unless the authorization is terminated or revoked.     Resp Syncytial Virus by PCR NEGATIVE NEGATIVE Final    Comment: (NOTE) Fact Sheet for Patients: bloggercourse.com  Fact Sheet for Healthcare Providers: seriousbroker.it  This test is not yet approved or cleared by the [place]  FDA and has been authorized for detection and/or diagnosis of SARS-CoV-2 by FDA under an Emergency Use Authorization (EUA). This EUA will remain in effect (meaning this test can be used) for the duration of the COVID-19 declaration under Section 564(b)(1) of the Act, 21 U.S.C. section 360bbb-3(b)(1), unless the authorization is terminated or revoked.  Performed at [facility], [address] [place], [place] [address]   SARS Coronavirus 2 by RT  PCR (hospital order, performed in [facility] hospital lab) *cepheid single result test* Anterior Nasal Swab     Status: None   Collection Time: 06/01/23  2:53 PM   Specimen: Anterior Nasal Swab  Result Value Ref Range Status   SARS Coronavirus 2 by RT PCR NEGATIVE NEGATIVE  Final    Comment: (NOTE) SARS-CoV-2 target nucleic acids are NOT DETECTED.  The SARS-CoV-2 RNA is generally detectable in upper and lower respiratory specimens during the acute phase of infection. The lowest concentration of SARS-CoV-2 viral copies this assay can detect is 250 copies / mL. A negative result does not preclude SARS-CoV-2 infection and should not be used as the sole basis for treatment or other patient management decisions.  A negative result may occur with improper specimen collection / handling, submission of specimen other than nasopharyngeal swab, presence of viral mutation(s) within the areas targeted by this assay, and inadequate number of viral copies (<250 copies / mL). A negative result must be combined with clinical observations, patient history, and epidemiological information.  Fact Sheet for Patients:   roadlaptop.co.za  Fact Sheet for Healthcare Providers: http://kim-miller.com/  This test is not yet approved or  cleared by the [place]  FDA and has been authorized for detection and/or diagnosis of SARS-CoV-2 by FDA under an Emergency Use Authorization (EUA).  This EUA will remain in effect (meaning this test can be used) for the duration of the COVID-19 declaration under Section 564(b)(1) of the Act, 21 U.S.C. section 360bbb-3(b)(1), unless the authorization is terminated or revoked sooner.  Performed at [facility], [address] [place], [place] [address]   Respiratory virus panel     Status: None   Collection Time: 06/03/23  3:57 PM   Specimen: Nasal Swab; Respiratory  Result Value Ref Range Status   Adenovirus B  Not Detected Not Detected Final   Rhinovirus Not Detected Not Detected Final   Influenza A Not Detected Not Detected Final   INFLUENZA A SUBTYPE H1 Not Detected Not Detected Final   INFLUENZA A SUBTYPE H3 Not Detected Not Detected Final   Influenza B Not Detected Not Detected Final   Metapneumovirus Not Detected Not Detected Final   Respiratory Syncytial Virus A Not Detected Not Detected Final   Respiratory Syncytial Virus B Not Detected Not Detected Final   HUMAN PARAINFLU VIRUS 1 Not Detected Not Detected Final   HUMAN PARAINFLU VIRUS 2 Not Detected Not Detected Final   HUMAN PARAINFLU VIRUS 3 Not Detected Not Detected Final   Comment see note  Final    Comment: THIS ASSAY WILL NOT DETECT SARS-CoV-2 (COVID-19) . This test is performed using the [employer]. . . A positive Influenza A associated with negative H1 or H3 subtyping results do not preclude influenza A virus H1/ H3 infections. Low level positive Influenza A may result in unsubtypeable seasonal Influenza. Samples can be submitted for further testing at state public health laboratories if available. . The manufacturer of a reagent used for this assay has informed laboratories that human metapneumovirus positive results obtained performing this test, especially when detected in a co-infection, should be confirmed with an alternative molecular human metapneumovirus assay, if medically indicated. The results of this test should not be used as the sole basis for diagnosis, treatment, or other patient management decisions. For inquiries, please contact [phone] ([phone]). . A positive Influenza A associated with negative H1 or H3 subtypin g results do not preclude influenza A virus H1/H3 infections. Low level positive Influenza A may result in unsubtypeable seasonal Influenza. Samples can be submitted for further testing at state public health laboratories if available. . Limitations: A  negative result does not rule out respiratory pathogen infection below the sensitivity limit of the assay. The sensitivity depends on pathogen and sample type. . This assay cannot  reliably distinguish between Rhinovirus and Enterovirus due to genetic similarities between the viruses. .   Gastrointestinal Panel by PCR , Stool     Status: None   Collection Time: 06/09/23 12:42 PM   Specimen: Stool  Result Value Ref Range Status   Campylobacter species NOT DETECTED NOT DETECTED Final   Plesimonas shigelloides NOT DETECTED NOT DETECTED Final   Salmonella species NOT DETECTED NOT DETECTED Final   Yersinia enterocolitica NOT DETECTED NOT DETECTED Final   Vibrio species NOT DETECTED NOT DETECTED Final   Vibrio cholerae NOT DETECTED NOT DETECTED Final   Enteroaggregative E coli (EAEC) NOT DETECTED NOT DETECTED Final   Enteropathogenic E coli (EPEC) NOT DETECTED NOT DETECTED Final   Enterotoxigenic E coli (ETEC) NOT DETECTED NOT DETECTED Final   Shiga like toxin producing E coli (STEC) NOT DETECTED NOT DETECTED Final   Shigella/Enteroinvasive E coli (EIEC) NOT DETECTED NOT DETECTED Final   Cryptosporidium NOT DETECTED NOT DETECTED Final   Cyclospora cayetanensis NOT DETECTED NOT DETECTED Final   Entamoeba histolytica NOT DETECTED NOT DETECTED Final   Giardia lamblia NOT DETECTED NOT DETECTED Final   Adenovirus F40/41 NOT DETECTED NOT DETECTED Final   Astrovirus NOT DETECTED NOT DETECTED Final   Norovirus GI/GII NOT DETECTED NOT DETECTED Final   Rotavirus A NOT DETECTED NOT DETECTED Final   Sapovirus (I, II, IV, and V) NOT DETECTED NOT DETECTED Final    Comment: Performed at [facility], [address] [place], [place] [address]  Resp panel by RT-PCR (RSV, Flu A&B, Covid) Anterior Nasal Swab     Status: None   Collection Time: 06/09/23  9:53 PM   Specimen: Anterior Nasal Swab  Result Value Ref Range Status   SARS Coronavirus 2 by RT PCR NEGATIVE NEGATIVE Final    Comment:  (NOTE) SARS-CoV-2 target nucleic acids are NOT DETECTED.  The SARS-CoV-2 RNA is generally detectable in upper respiratory specimens during the acute phase of infection. The lowest concentration of SARS-CoV-2 viral copies this assay can detect is 138 copies/mL. A negative result does not preclude SARS-Cov-2 infection and should not be used as the sole basis for treatment or other patient management decisions. A negative result may occur with  improper specimen collection/handling, submission of specimen other than nasopharyngeal swab, presence of viral mutation(s) within the areas targeted by this assay, and inadequate number of viral copies(<138 copies/mL). A negative result must be combined with clinical observations, patient history, and epidemiological information. The expected result is Negative.  Fact Sheet for Patients:  bloggercourse.com  Fact Sheet for Healthcare Providers:  seriousbroker.it  This test is no t yet approved or cleared by the [place]  FDA and  has been authorized for detection and/or diagnosis of SARS-CoV-2 by FDA under an Emergency Use Authorization (EUA). This EUA will remain  in effect (meaning this test can be used) for the duration of the COVID-19 declaration under Section 564(b)(1) of the Act, 21 U.S.C.section 360bbb-3(b)(1), unless the authorization is terminated  or revoked sooner.       Influenza A by PCR NEGATIVE NEGATIVE Final   Influenza B by PCR NEGATIVE NEGATIVE Final    Comment: (NOTE) The Xpert Xpress SARS-CoV-2/FLU/RSV plus assay is intended as an aid in the diagnosis of influenza from Nasopharyngeal swab specimens and should not be used as a sole basis for treatment. Nasal washings and aspirates are unacceptable for Xpert Xpress SARS-CoV-2/FLU/RSV testing.  Fact Sheet for Patients: bloggercourse.com  Fact Sheet for Healthcare  Providers: seriousbroker.it  This test  is not yet approved or cleared by the [place]  FDA and has been authorized for detection and/or diagnosis of SARS-CoV-2 by FDA under an Emergency Use Authorization (EUA). This EUA will remain in effect (meaning this test can be used) for the duration of the COVID-19 declaration under Section 564(b)(1) of the Act, 21 U.S.C. section 360bbb-3(b)(1), unless the authorization is terminated or revoked.     Resp Syncytial Virus by PCR NEGATIVE NEGATIVE Final    Comment: (NOTE) Fact Sheet for Patients: bloggercourse.com  Fact Sheet for Healthcare Providers: seriousbroker.it  This test is not yet approved or cleared by the [place]  FDA and has been authorized for detection and/or diagnosis of SARS-CoV-2 by FDA under an Emergency Use Authorization (EUA). This EUA will remain in effect (meaning this test can be used) for the duration of the COVID-19 declaration under Section 564(b)(1) of the Act, 21 U.S.C. section 360bbb-3(b)(1), unless the authorization is terminated or revoked.  Performed at [facility], [address] [place], [place] [address]   Culture, blood (single)     Status: None (Preliminary result)   Collection Time: 06/09/23 10:59 PM   Specimen: BLOOD LEFT ARM  Result Value Ref Range Status   Specimen Description   Final    BLOOD LEFT ARM Performed at [facility], [address] [place], [place] [address]    Special Requests   Final    BOTTLES DRAWN AEROBIC AND ANAEROBIC Blood Culture results may not be optimal due to an inadequate volume of blood received in culture bottles Performed at [facility], [address] [place], [place] [address]    Culture  Setup Time   Final    ANAEROBIC BOTTLE ONLY GRAM POSITIVE COCCI Organism ID to follow CRITICAL RESULT CALLED TO, READ BACK BY AND VERIFIED WITH: ESTILL LUTES PHARMD  [id number] 06/11/23 HNM GRAM STAIN REVIEWED-AGREE WITH RESULT DRT Performed at [facility], [address] [place], [place] [address]    Culture GRAM POSITIVE COCCI  Final   Report Status PENDING  Incomplete  Blood Culture ID Panel (Reflexed)     Status: None   Collection Time: 06/09/23 10:59 PM  Result Value Ref Range Status   Enterococcus faecalis NOT DETECTED NOT DETECTED Final   Enterococcus Faecium NOT DETECTED NOT DETECTED Final   Listeria monocytogenes NOT DETECTED NOT DETECTED Final   Staphylococcus species NOT DETECTED NOT DETECTED Final   Staphylococcus aureus (BCID) NOT DETECTED NOT DETECTED Final   Staphylococcus epidermidis NOT DETECTED NOT DETECTED Final   Staphylococcus lugdunensis NOT DETECTED NOT DETECTED Final   Streptococcus species NOT DETECTED NOT DETECTED Final   Streptococcus agalactiae NOT DETECTED NOT DETECTED Final   Streptococcus pneumoniae NOT DETECTED NOT DETECTED Final   Streptococcus pyogenes NOT DETECTED NOT DETECTED Final   A.calcoaceticus-baumannii NOT DETECTED NOT DETECTED Final   Bacteroides fragilis NOT DETECTED NOT DETECTED Final   Enterobacterales NOT DETECTED NOT DETECTED Final   Enterobacter cloacae complex NOT DETECTED NOT DETECTED Final   Escherichia coli NOT DETECTED NOT DETECTED Final   Klebsiella aerogenes NOT DETECTED NOT DETECTED Final   Klebsiella oxytoca NOT DETECTED NOT DETECTED Final   Klebsiella pneumoniae NOT DETECTED NOT DETECTED Final   Proteus species NOT DETECTED NOT DETECTED Final   Salmonella species NOT DETECTED NOT DETECTED Final   Serratia marcescens NOT DETECTED NOT DETECTED Final   Haemophilus influenzae NOT DETECTED NOT DETECTED Final   Neisseria meningitidis NOT DETECTED NOT DETECTED Final   Pseudomonas aeruginosa NOT  DETECTED NOT DETECTED Final   Stenotrophomonas maltophilia NOT DETECTED NOT DETECTED Final   Candida albicans NOT DETECTED NOT DETECTED Final   Candida auris NOT DETECTED NOT DETECTED Final   Candida  glabrata NOT DETECTED NOT DETECTED Final   Candida krusei NOT DETECTED NOT DETECTED Final   Candida parapsilosis NOT DETECTED NOT DETECTED Final   Candida tropicalis NOT DETECTED NOT DETECTED Final   Cryptococcus neoformans/gattii NOT DETECTED NOT DETECTED Final    Comment: Performed at [facility], [address] [place], [place] [address]  Blood culture (single)     Status: None (Preliminary result)   Collection Time: 06/10/23 12:53 AM   Specimen: BLOOD  Result Value Ref Range Status   Specimen Description BLOOD RIGHT ARM  Final   Special Requests   Final    BOTTLES DRAWN AEROBIC AND ANAEROBIC Blood Culture results may not be optimal due to an inadequate volume of blood received in culture bottles   Culture   Final    NO GROWTH 1 DAY Performed at [facility], [address], [place], [place] [address]    Report Status PENDING  Incomplete    IMAGING RESULTS: CT chest , abd- no acute findings Rectosigmoid diverticulosis I have personally reviewed the films ? 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 Check cdiff Could this be secondary to the above infection She may need colonoscopy  Will need MRI thoracic spine because of pain and bacteremia  AKI  Anemia  Leucocytosis- improving  H/o mitral valve endocarditis H/o streptococcus bacteremia  H/o left leg acute ischemia, compartment syndrome in 2023 ? ? ___I have personally spent  ---minutes involved in face-to-face and non-face-to-face activities for this patient on the day of the visit. Professional time spent includes the following activities: Preparing to see the patient (review of tests), Obtaining and/or reviewing separately obtained history (admission/discharge  record), Performing a medically appropriate examination and/or evaluation , Ordering medications/tests/procedures, referring and communicating with other health care professionals, Documenting clinical information in the EMR, Independently interpreting results (not separately reported), Communicating results to the patient/r, Counseling and educating the patient/ and Care coordination (not separately reported).   This involved complicated antimicrobial therapy, counseling and treatment  ________________________________________________  Note:  This document was prepared using Dragon voice recognition software and may include unintentional dictation errors.

## 2023-06-11 NOTE — Progress Notes (Signed)
 Heart Failure Stewardship Pharmacy Note  PCP: Myrla Jon HERO, MD PCP-Cardiologist: Deatrice Cage, MD  HPI: Tina Peters is a 73 y.o. female with A-fib on Xarelto , hypertension, CKD stage IIIa, prior history of stroke, history of systolic heart failure EF of 45%, hypertension who presented with nausea, vomiting, and diarrhea for 1-2 weeks prior to presentation. BNP on admission was 720. PCT on admission was 31.85. HS-tropnonin was 283 > 331. Lactic acid was 2.6 on admission, improving to 1.7 with re-hydration. Free T4 is elevated and TSH is normal. No acute findings on chest XR.   Pertinent cardiac history: Echo in 08/2014 noted LVEF 60-65% with mild-moderate MR. Echo in 02/2020 showed LVEF of 60-65% with grade II diastolic dysfunction, mild MR. TEE 09/2021 noted vegetation on mitral valve. Cath in 10/2021 with nonobstructive coronaries. Bioprosthetic mitral valve replacement in 10/2021. Echo 07/2022 with LVEF of 45%, low normal RV function, stable bioprosthetic mitral valve. Echo 06/11/23 showed LVEF of 50-55% with mildly reduced RV function, moderate-severe mitral stenosis, and mild-moderate TR.   Pertinent Lab Values: Creatinine, Ser  Date Value Ref Range Status  06/11/2023 1.02 (H) 0.44 - 1.00 mg/dL Final   BUN  Date Value Ref Range Status  06/11/2023 13 8 - 23 mg/dL Final  98/89/7974 13 8 - 27 mg/dL Final   Potassium  Date Value Ref Range Status  06/11/2023 3.6 3.5 - 5.1 mmol/L Final   Sodium  Date Value Ref Range Status  06/11/2023 135 135 - 145 mmol/L Final  05/16/2023 146 (H) 134 - 144 mmol/L Final   B Natriuretic Peptide  Date Value Ref Range Status  06/09/2023 720.2 (H) 0.0 - 100.0 pg/mL Final    Comment:    Performed at [facility], [address] [place], [place] [address]   Magnesium   Date Value Ref Range Status  06/10/2023 2.3 1.7 - 2.4 mg/dL Final    Comment:    Performed at [facility], [address] [place], [place] [address]    Hgb A1c MFr Bld  Date Value Ref Range Status  08/16/2021 5.9 (H) 4.8 - 5.6 % Final    Comment:             Prediabetes: 5.7 - 6.4          Diabetes: >6.4          Glycemic control for adults with diabetes: <7.0    TSH  Date Value Ref Range Status  06/10/2023 1.423 0.350 - 4.500 uIU/mL Final    Comment:    Performed by a 3rd Generation assay with a functional sensitivity of <=0.01 uIU/mL. Performed at [facility], [address] [place], [place] [address]   05/16/2023 1.880 0.450 - 4.500 uIU/mL Final    Vital Signs:  Temp:  [97.7 F (36.5 C)-99.6 F (37.6 C)] 98.2 F (36.8 C) (02/05 0938) Pulse Rate:  [55-68] 61 (02/05 0938) Cardiac Rhythm: Sinus bradycardia (02/05 0901) Resp:  [17-27] 19 (02/05 0355) BP: (92-136)/(46-71) 119/49 (02/05 0938) SpO2:  [93 %-100 %] 98 % (02/05 0938)  Intake/Output Summary (Last 24 hours) at 06/11/2023 1009 Last data filed at 06/11/2023 0820 Gross per 24 hour  Intake 1726.01 ml  Output --  Net 1726.01 ml    Current Heart Failure Medications:  Loop diuretic: none Beta-Blocker: metoprolol  25 mg daily ACEI/ARB/ARNI: none MRA: none SGLT2i: none  Prior to admission Heart Failure Medications:  Loop diuretic: none Beta-Blocker: metoprolol  succinate 50 mg daily ACEI/ARB/ARNI: losartan  25 mg daily MRA: none  SGLT2i: none Other: none  Assessment: 1. Acute on chronic diastolic heart failure (LVEF 50-55%) with mildly reduced RV function, due to NICM. NYHA class II-III symptoms.  -Symptoms: Reports feeling better today with hydration. Patient is less fatigued and diarrhea has improved. -Volume: Hypovolemic on admission, appears to be closer to euvolemic today.  -Hemodynamics: BP improved with rehydration. Heart rate is 50-60s. -BB: Home metoprolol  reduce to 25 mg daily. Will monitor with bradycardia. -ACEI/ARB/ARNI: Held at this time given lower BP on admission. Can consider resuming at a later date. -MRA: Not filling  spironolactone  for several months. Not appropriate to initiate at this time given dehydration. -SGLT2i: Not appropriate to initiate at this time given dehydration. -Thyroid  hormones abnormal. On amiodarone  for AF, currently in sinus rhythm. -Cardiology noted concern for vegetation of prosthetic mitral valve, planning TEE.   Plan: 1) Medication changes recommended at this time: -None  2) Patient assistance: -Pending  3) Education: - Patient has been educated on current HF medications and potential additions to HF medication regimen - Patient verbalizes understanding that over the next few months, these medication doses may change and more medications may be added to optimize HF regimen - Patient has been educated on basic disease state pathophysiology and goals of therapy  Medication Assistance / Insurance Benefits Check: Does the patient have prescription insurance?    Type of insurance plan:  Does the patient qualify for medication assistance through manufacturers or grants? Pending  Outpatient Pharmacy: Prior to admission outpatient pharmacy[facility]      Please do not hesitate to reach out with questions or concerns,  Jaun Bash, PharmD, CPP, BCPS Heart Failure Pharmacist  Phone - [phone] 06/11/2023 10:51 AM

## 2023-06-11 NOTE — Progress Notes (Signed)
*  PRELIMINARY RESULTS* Echocardiogram Echocardiogram Transesophageal has been performed.  Tina Peters 06/11/2023, 1:15 PM

## 2023-06-11 NOTE — Progress Notes (Addendum)
 ANTICOAGULATION CONSULT NOTE  Pharmacy Consult for heparin  infusion Indication: ACS/STEMI  No Known Allergies  Patient Measurements: Height: 5' 5 (165.1 cm) Weight: 102 kg (224 lb 13.9 oz) IBW/kg (Calculated) : 57 Heparin  Dosing Weight: 80.5 kg  Vital Signs: Temp: 99.6 F (37.6 C) (02/04 2323) Temp Source: Oral (02/04 2323) BP: 129/51 (02/04 2323) Pulse Rate: 64 (02/04 2323)  Labs: Recent Labs    06/09/23 2153 06/10/23 0053 06/10/23 0359 06/10/23 0731 06/10/23 1242 06/10/23 1531 06/10/23 2253  HGB 10.2*  --   --   --   --  9.4*  --   HCT 31.4*  --   --   --   --  29.7*  --   PLT 186  --   --   --   --  178  --   APTT  --   --  37*  --  39*  --  54*  LABPROT  --   --  18.9*  --   --   --   --   INR  --   --  1.6*  --   --   --   --   HEPARINUNFRC  --   --  >1.10*  --   --   --   --   CREATININE 1.14*  --   --   --   --  1.12*  --   TROPONINIHS 283* 331*  --  362*  --  228*  --     Estimated Creatinine Clearance: 53.8 mL/min (A) (by C-G formula based on SCr of 1.12 mg/dL (H)).   Medical History: Past Medical History:  Diagnosis Date   Allergy    Anxiety    Arthritis    Asthma    Chronic back pain    Chronic combined systolic (congestive) and diastolic (congestive) heart failure (HCC)    a. 02/2020 Echo: EF 60-65%, no rwma. Gr2 DD. Nl RV size/fxn. Mild LAE. Mild MR; b. 10/2021 Echo: [facility] following MVR) EF 40 to 45%.   Depression    Diverticulitis    Endocarditis    a. 10/2021 S sanguinous bacteremia w/ MV veg-->95mm Mitris bioprosthetic valve, Maze, and LAA clip.   Hyperlipidemia    Hypertension    Lipoma    Mild mitral regurgitation    a. 08/2016 Echo: mild MR; b. 02/2020 Echo: Mild MR.   Neuromuscular disorder (HCC)    Obesity    Obstructive sleep apnea    PAF (paroxysmal atrial fibrillation) (HCC)    a. Dx 05/2016--> Xarelto  (CHA2DS2VASc = 3); b. 08/2016 Echo: EF 55-60%, no rwma, mild MR, mod dil LA; c. 03/2020 Zio: Avg HR 61 w/ Afib burden of 24% - max  rate 189 (avg 93). 2 pauses - longest 4.7 secs post-conversion pauses (occurred @ 5:42 am); d. 04/2022 s/p DCCV; e. 08/2022 s/p DCCV.   Paroxysmal SVT (supraventricular tachycardia) (HCC)    S/P mitral valve replacement    a. 10/2021 [facility] - Endocarditis/MV Veg-->70mm Mitris bioprosthetic valve, Maze, and LAA clip.   Sleep apnea    Stroke [facility])    a. 02/2020 MRI/A: multifocal acute ischemia in R MCA territory predominantly involving post insula and R middle frontal gyrus. Nl MRA.   Tachy-brady syndrome (HCC)    Tobacco abuse     Medications:  PTA Meds: Rivaroxaban  20 mg qpm, last dose unknown  Assessment: Pt is a 73 yo female presenting to ED c/o diarrhea x6 days and vomiting. Reports feeling of heart fluttering and  pain that started x2 days ago, found with elevated BNP and Troponin I level trending up.  Goal of Therapy:  Heparin  level 0.3-0.7 units/ml aPTT 66-102 seconds Monitor platelets by anticoagulation protocol: Yes  Monitoring: 2/4: aPTT @ 1242 was 39 Subtherapeutic 2/4 2253 aPTT 54, subtherapeutic  Plan: Bolus 2400 units of heparin  Increase heparin  infusion to 1600 units/hr Re-check aPTT in 8 hours after rate change Daily HL as rivaroxaban  washes out CBC daily while on heparin  infusion  Rankin CANDIE Dills, PharmD, [facility] 06/11/2023 2:46 AM

## 2023-06-11 NOTE — Progress Notes (Signed)
 PHARMACY - PHYSICIAN COMMUNICATION CRITICAL VALUE ALERT - BLOOD CULTURE IDENTIFICATION (BCID)  Tina Peters is an 73 y.o. female who presented to [facility] on 06/09/2023 with a chief complaint of N/V/D  Assessment:  2/3 blood cx with GPC, 1/4 bottles (anaerobic bottle).  BCID did not detect any organisms (so would not expect it to be staphylococcus or streptococcus).  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  TEE today with MV vegetation  Name of physician (or Provider) Contacted: Dr Marsa  Current antibiotics: Ceftriaxone  and metronidazole   Changes to prescribed antibiotics recommended:  ID consult  Results for orders placed or performed during the hospital encounter of 06/09/23  Blood Culture ID Panel (Reflexed) (Collected: 06/09/2023 10:59 PM)  Result Value Ref Range   Enterococcus faecalis NOT DETECTED NOT DETECTED   Enterococcus Faecium NOT DETECTED NOT DETECTED   Listeria monocytogenes NOT DETECTED NOT DETECTED   Staphylococcus species NOT DETECTED NOT DETECTED   Staphylococcus aureus (BCID) NOT DETECTED NOT DETECTED   Staphylococcus epidermidis NOT DETECTED NOT DETECTED   Staphylococcus lugdunensis NOT DETECTED NOT DETECTED   Streptococcus species NOT DETECTED NOT DETECTED   Streptococcus agalactiae NOT DETECTED NOT DETECTED   Streptococcus pneumoniae NOT DETECTED NOT DETECTED   Streptococcus pyogenes NOT DETECTED NOT DETECTED   A.calcoaceticus-baumannii NOT DETECTED NOT DETECTED   Bacteroides fragilis NOT DETECTED NOT DETECTED   Enterobacterales NOT DETECTED NOT DETECTED   Enterobacter cloacae complex NOT DETECTED NOT DETECTED   Escherichia coli NOT DETECTED NOT DETECTED   Klebsiella aerogenes NOT DETECTED NOT DETECTED   Klebsiella oxytoca NOT DETECTED NOT DETECTED   Klebsiella pneumoniae NOT DETECTED NOT DETECTED   Proteus species NOT DETECTED NOT DETECTED   Salmonella species NOT DETECTED NOT DETECTED   Serratia marcescens NOT  DETECTED NOT DETECTED   Haemophilus influenzae NOT DETECTED NOT DETECTED   Neisseria meningitidis NOT DETECTED NOT DETECTED   Pseudomonas aeruginosa NOT DETECTED NOT DETECTED   Stenotrophomonas maltophilia NOT DETECTED NOT DETECTED   Candida albicans NOT DETECTED NOT DETECTED   Candida auris NOT DETECTED NOT DETECTED   Candida glabrata NOT DETECTED NOT DETECTED   Candida krusei NOT DETECTED NOT DETECTED   Candida parapsilosis NOT DETECTED NOT DETECTED   Candida tropicalis NOT DETECTED NOT DETECTED   Cryptococcus neoformans/gattii NOT DETECTED NOT DETECTED   Celestine Slovak, PharmD, BCPS, BCIDP Work Cell: [phone] 06/11/2023 10:19 AM

## 2023-06-11 NOTE — Progress Notes (Signed)
 Patient's daughter arrived to bedside asking to speak to provider about plan of care. This RN notified Dr. Melodi Sprung per secure chat who reported that she would contact family in the afternoon.

## 2023-06-11 NOTE — Plan of Care (Signed)

## 2023-06-11 NOTE — Anesthesia Preprocedure Evaluation (Signed)
 Anesthesia Evaluation  Patient identified by MRN, date of birth, ID band Patient awake    Reviewed: Allergy & Precautions, NPO status , Patient's Chart, lab work & pertinent test results  History of Anesthesia Complications Negative for: history of anesthetic complications  Airway Mallampati: III  TM Distance: >3 FB Neck ROM: full    Dental no notable dental hx.    Pulmonary shortness of breath, asthma , sleep apnea and Continuous Positive Airway Pressure Ventilation , former smoker   Pulmonary exam normal        Cardiovascular hypertension, + Peripheral Vascular Disease and +CHF  + dysrhythmias Atrial Fibrillation + Valvular Problems/Murmurs (s/p MV replacement)   2/4: Patient ntoed to have elevated troponins attributed to demand ischemia  ECHO (06/10/23)  IMPRESSIONS     1. Left ventricular ejection fraction, by estimation, is 50 to 55%. The  left ventricle has low normal function. The left ventricle has no regional  wall motion abnormalities. Left ventricular diastolic function could not  be evaluated.   2. Right ventricular systolic function is mildly reduced. The right  ventricular size is mildly enlarged.   3. Left atrial size was severely dilated.   4. Right atrial size was mildly dilated.   5. The mitral valve has been repaired/replaced. No evidence of mitral  valve regurgitation. Moderate to severe mitral stenosis. The mean mitral  valve gradient is 11.0 mmHg.   6. Tricuspid valve regurgitation is mild to moderate.   7. The aortic valve is tricuspid. There is mild thickening of the aortic  valve. Aortic valve regurgitation is not visualized. Aortic valve  sclerosis/calcification is present, without any evidence of aortic  stenosis.     Neuro/Psych  PSYCHIATRIC DISORDERS Anxiety Depression     Neuromuscular disease CVA    GI/Hepatic negative GI ROS, Neg liver ROS,,,  Endo/Other  negative endocrine ROS     Renal/GU Renal disease  negative genitourinary   Musculoskeletal   Abdominal   Peds  Hematology negative hematology ROS (+)   Anesthesia Other Findings Past Medical History: No date: Allergy No date: Anxiety No date: Arthritis No date: Asthma No date: Chronic back pain No date: Chronic combined systolic (congestive) and diastolic  (congestive) heart failure     Comment:  a. 02/2020 Echo: EF 60-65%, no rwma. Gr2 DD. Nl RV               size/fxn. Mild LAE. Mild MR; b. 10/2021 Echo: [facility]               following MVR) EF 40 to 45%. No date: Depression No date: Diverticulitis No date: Endocarditis     Comment:  a. 10/2021 S sanguinous bacteremia w/ MV veg-->82mm               Mitris bioprosthetic valve, Maze, and LAA clip. No date: Hyperlipidemia No date: Hypertension No date: Lipoma No date: Mild mitral regurgitation     Comment:  a. 08/2016 Echo: mild MR; b. 02/2020 Echo: Mild MR. No date: Neuromuscular disorder No date: Obesity No date: Obstructive sleep apnea No date: PAF (paroxysmal atrial fibrillation)     Comment:  a. Dx 05/2016--> Xarelto  (CHA2DS2VASc = 3); b. 08/2016               Echo: EF 55-60%, no rwma, mild MR, mod dil LA; c. 03/2020              Zio: Avg HR 61 w/ Afib burden of 24% - max rate 189 (  avg               93). 2 pauses - longest 4.7 secs post-conversion pauses               (occurred @ 5:42 am). No date: Paroxysmal SVT (supraventricular tachycardia) No date: S/P mitral valve replacement     Comment:  a. 10/2021 [facility] - Endocarditis/MV Veg-->92mm Mitris               bioprosthetic valve, Maze, and LAA clip. No date: Sleep apnea No date: Stroke     Comment:  a. 02/2020 MRI/A: multifocal acute ischemia in R MCA               territory predominantly involving post insula and R               middle frontal gyrus. Nl MRA. No date: Tachy-brady syndrome No date: Tobacco abuse  Past Surgical History: No date: ACHILLES TENDON SURGERY; Left No date: BACK  SURGERY     Comment:  2 ruptured discs in L spine, no hardware 04/22/2022: CARDIOVERSION; N/A     Comment:  Procedure: CARDIOVERSION;  Surgeon: Darron Deatrice LABOR,               MD;  Location: [facility] ORS;  Service: Cardiovascular;                Laterality: N/A; No date: KNEE ARTHROSCOPY W/ MENISCAL REPAIR; Right     Comment:  in the 1990s No date: SALPINGOOPHORECTOMY; Bilateral No date: TUMOR REMOVAL     Comment:  neck, ?lipoma 10/04/2021: VALVE REPLACEMENT  BMI    Body Mass Index: 36.94 kg/m      Reproductive/Obstetrics negative OB ROS                              Anesthesia Physical Anesthesia Plan  ASA: 3  Anesthesia Plan: General   Post-op Pain Management: Minimal or no pain anticipated   Induction: Intravenous  PONV Risk Score and Plan: 2 and Propofol  infusion and TIVA  Airway Management Planned: Natural Airway and Nasal Cannula  Additional Equipment:   Intra-op Plan:   Post-operative Plan:   Informed Consent: I have reviewed the patients History and Physical, chart, labs and discussed the procedure including the risks, benefits and alternatives for the proposed anesthesia with the patient or authorized representative who has indicated his/her understanding and acceptance.     Dental Advisory Given  Plan Discussed with: Anesthesiologist, CRNA and Surgeon  Anesthesia Plan Comments: (Patient consented for risks of anesthesia including but not limited to:  - adverse reactions to medications - risk of airway placement if required - damage to eyes, teeth, lips or other oral mucosa - nerve damage due to positioning  - sore throat or hoarseness - Damage to heart, brain, nerves, lungs, other parts of body or loss of life  Patient voiced understanding.)        Anesthesia Quick Evaluation

## 2023-06-11 NOTE — Progress Notes (Signed)
 ANTICOAGULATION CONSULT NOTE  Pharmacy Consult for heparin  infusion Indication: ACS/STEMI/afib  No Known Allergies  Patient Measurements: Height: 5' 5 (165.1 cm) Weight: 102 kg (224 lb 13.9 oz) IBW/kg (Calculated) : 57 Heparin  Dosing Weight: 80.5 kg  Vital Signs: Temp: 98.2 F (36.8 C) (02/05 0938) Temp Source: Oral (02/05 0938) BP: 119/49 (02/05 0938) Pulse Rate: 61 (02/05 0938)  Labs: Recent Labs    06/09/23 2153 06/10/23 0053 06/10/23 0359 06/10/23 0359 06/10/23 0731 06/10/23 1242 06/10/23 1531 06/10/23 2253 06/11/23 0635 06/11/23 1123  HGB 10.2*  --   --   --   --   --  9.4*  --  8.5*  --   HCT 31.4*  --   --   --   --   --  29.7*  --  27.5*  --   PLT 186  --   --   --   --   --  178  --  160  --   APTT  --   --  37*   < >  --  39*  --  54*  --  78*  LABPROT  --   --  18.9*  --   --   --   --   --   --   --   INR  --   --  1.6*  --   --   --   --   --   --   --   HEPARINUNFRC  --   --  >1.10*  --   --   --   --   --   --   --   CREATININE 1.14*  --   --   --   --   --  1.12*  --  1.02*  --   TROPONINIHS 283* 331*  --   --  362*  --  228*  --   --   --    < > = values in this interval not displayed.    Estimated Creatinine Clearance: 59 mL/min (A) (by C-G formula based on SCr of 1.02 mg/dL (H)).   Medical History: Past Medical History:  Diagnosis Date   Allergy    Anxiety    Arthritis    Asthma    Chronic back pain    Chronic combined systolic (congestive) and diastolic (congestive) heart failure (HCC)    a. 02/2020 Echo: EF 60-65%, no rwma. Gr2 DD. Nl RV size/fxn. Mild LAE. Mild MR; b. 10/2021 Echo: [facility] following MVR) EF 40 to 45%.   Depression    Diverticulitis    Endocarditis    a. 10/2021 S sanguinous bacteremia w/ MV veg-->84mm Mitris bioprosthetic valve, Maze, and LAA clip.   Hyperlipidemia    Hypertension    Lipoma    Mild mitral regurgitation    a. 08/2016 Echo: mild MR; b. 02/2020 Echo: Mild MR.   Neuromuscular disorder (HCC)    Obesity     Obstructive sleep apnea    PAF (paroxysmal atrial fibrillation) (HCC)    a. Dx 05/2016--> Xarelto  (CHA2DS2VASc = 3); b. 08/2016 Echo: EF 55-60%, no rwma, mild MR, mod dil LA; c. 03/2020 Zio: Avg HR 61 w/ Afib burden of 24% - max rate 189 (avg 93). 2 pauses - longest 4.7 secs post-conversion pauses (occurred @ 5:42 am); d. 04/2022 s/p DCCV; e. 08/2022 s/p DCCV.   Paroxysmal SVT (supraventricular tachycardia) (HCC)    S/P mitral valve replacement    a. 10/2021 [facility] - Endocarditis/MV  Veg-->31mm Mitris bioprosthetic valve, Maze, and LAA clip.   Sleep apnea    Stroke [facility])    a. 02/2020 MRI/A: multifocal acute ischemia in R MCA territory predominantly involving post insula and R middle frontal gyrus. Nl MRA.   Tachy-brady syndrome (HCC)    Tobacco abuse     Medications:  PTA Meds: Rivaroxaban  20 mg qpm, last dose unknown  Assessment: Pt is a 73 yo female presenting to ED c/o diarrhea x6 days and vomiting. Reports feeling of heart fluttering and pain that started x2 days ago, found with elevated BNP and Troponin I level trending up. Pt has hx of afib and is on rivaroxaban . CHADSVASc 6.   Goal of Therapy:  Heparin  level 0.3-0.7 units/ml aPTT 66-102 seconds Monitor platelets by anticoagulation protocol: Yes  Monitoring: 2/4: aPTT @ 1242 was 39 Subtherapeutic 2/4 2253 aPTT 54, subtherapeutic 2/5 1123 aPTT 78.    Plan: aPTT is therapeutic. Will continue heparin  infusion at 1600 units/hr. Recheck aPTT in 8 hours. Heparin  level and CBC with AM labs. Switch to heparin  level monitoring once aPTT and heparin  level are correlating.   Cathaleen Blanch,, PharmD,  06/11/2023 11:56 AM

## 2023-06-11 NOTE — Progress Notes (Signed)
 ANTICOAGULATION CONSULT NOTE  Pharmacy Consult for heparin  infusion Indication: ACS/STEMI/afib  No Known Allergies  Patient Measurements: Height: 5' 5 (165.1 cm) Weight: 102 kg (224 lb 13.9 oz) IBW/kg (Calculated) : 57 Heparin  Dosing Weight: 80.5 kg  Vital Signs: Temp: 98.3 F (36.8 C) (02/05 1503) Temp Source: Oral (02/05 1205) BP: 136/55 (02/05 1800) Pulse Rate: 64 (02/05 1800)  Labs: Recent Labs    06/09/23 2153 06/10/23 0053 06/10/23 0359 06/10/23 0731 06/10/23 1242 06/10/23 1531 06/10/23 2253 06/11/23 0635 06/11/23 1123 06/11/23 2008  HGB 10.2*  --   --   --   --  9.4*  --  8.5*  --   --   HCT 31.4*  --   --   --   --  29.7*  --  27.5*  --   --   PLT 186  --   --   --   --  178  --  160  --   --   APTT  --   --  37*  --    < >  --  54*  --  78* 55*  LABPROT  --   --  18.9*  --   --   --   --   --   --   --   INR  --   --  1.6*  --   --   --   --   --   --   --   HEPARINUNFRC  --   --  >1.10*  --   --   --   --   --   --   --   CREATININE 1.14*  --   --   --   --  1.12*  --  1.02*  --   --   TROPONINIHS 283* 331*  --  362*  --  228*  --   --   --   --    < > = values in this interval not displayed.    Estimated Creatinine Clearance: 59 mL/min (A) (by C-G formula based on SCr of 1.02 mg/dL (H)).   Medical History: Past Medical History:  Diagnosis Date   Allergy    Anxiety    Arthritis    Asthma    Chronic back pain    Chronic combined systolic (congestive) and diastolic (congestive) heart failure (HCC)    a. 02/2020 Echo: EF 60-65%, no rwma. Gr2 DD. Nl RV size/fxn. Mild LAE. Mild MR; b. 10/2021 Echo: [facility] following MVR) EF 40 to 45%.   Depression    Diverticulitis    Endocarditis    a. 10/2021 S sanguinous bacteremia w/ MV veg-->60mm Mitris bioprosthetic valve, Maze, and LAA clip.   Hyperlipidemia    Hypertension    Lipoma    Mild mitral regurgitation    a. 08/2016 Echo: mild MR; b. 02/2020 Echo: Mild MR.   Neuromuscular disorder (HCC)    Obesity     Obstructive sleep apnea    PAF (paroxysmal atrial fibrillation) (HCC)    a. Dx 05/2016--> Xarelto  (CHA2DS2VASc = 3); b. 08/2016 Echo: EF 55-60%, no rwma, mild MR, mod dil LA; c. 03/2020 Zio: Avg HR 61 w/ Afib burden of 24% - max rate 189 (avg 93). 2 pauses - longest 4.7 secs post-conversion pauses (occurred @ 5:42 am); d. 04/2022 s/p DCCV; e. 08/2022 s/p DCCV.   Paroxysmal SVT (supraventricular tachycardia) (HCC)    S/P mitral valve replacement    a. 10/2021 [facility] - Endocarditis/MV  Veg-->45mm Mitris bioprosthetic valve, Maze, and LAA clip.   Sleep apnea    Stroke [facility])    a. 02/2020 MRI/A: multifocal acute ischemia in R MCA territory predominantly involving post insula and R middle frontal gyrus. Nl MRA.   Tachy-brady syndrome (HCC)    Tobacco abuse    Medications:  PTA Meds: Rivaroxaban  20 mg qpm, last dose unknown  Assessment: Pt is a 73 yo female presenting to ED c/o diarrhea x6 days and vomiting. Reports feeling of heart fluttering and pain that started x2 days ago, found with elevated BNP and Troponin I level trending up. Pt has hx of afib and is on rivaroxaban . CHADSVASc 6.   Goal of Therapy:  Heparin  level 0.3-0.7 units/ml aPTT 66-102 seconds Monitor platelets by anticoagulation protocol: Yes  Monitoring: 2/4: aPTT @ 1242 was 39 Subtherapeutic 2/4 2253 aPTT 54, subtherapeutic 2/5 1123 aPTT 78 2/5 2008 aPTT 55  SUBtherapeutic  No issues with infusion reported, no signs/symptoms of bleeding noted.  Plan: Give heparin  bolus of 2400 units x1 Increase heparin  infusion to 1850 units/hour Check aPTT in 8 hours Monitor daily heparin  level and CBC while on heparin  infusion Switch to heparin  level monitoring once aPTT and heparin  level are correlating.   Thank you for involving pharmacy in this patient's care.   Damien Napoleon, PharmD Clinical Pharmacist 06/11/2023 8:48 PM

## 2023-06-11 NOTE — Progress Notes (Signed)
 Pt c/o 10/10 sacral pain d/t diarrhea. Pt describes pain as a burning/fiery sensation. This RN assessed sacrum which was light pink in coloring. Blanchable. Barrier cream applied. Dr. Melodi Sprung made aware.

## 2023-06-11 NOTE — Progress Notes (Signed)
 Transition of Care [facility]) - Inpatient Brief Assessment   Patient Details  Name: Tina Peters MRN: [id number] Date of Birth: [birth year]  Transition of Care [facility]) CM/SW Contact:    Frimet Durfee C Luc Shammas, RN Phone Number: 06/11/2023, 12:43 PM   Clinical Narrative: TOC continuing to follow patient's progress throughout discharge planning.   Transition of Care Asessment: Insurance and Status: Insurance coverage has been reviewed Patient has primary care physician: Yes   Prior level of function:: Independent Prior/Current Home Services: No current home services Social Drivers of Health Review: SDOH reviewed no interventions necessary Readmission risk has been reviewed: Yes Transition of care needs: no transition of care needs at this time

## 2023-06-11 NOTE — Progress Notes (Signed)
 PROGRESS NOTE    Tina Peters   FMW:[id number] DOB: Dec 16, 1950  DOA: 06/09/2023 Date of Service: 06/11/23 which is hospital day 1  PCP: Myrla Jon HERO, MD    Hospital course / significant events:   HPI: 73 year old female history of A-fib on Xarelto , hypertension, CKD stage IIIa, prior history of stroke, history of systolic heart failure EF of 45%, hypertension, presents to the ER with a 1-week history of nausea, vomiting and diarrhea.  Patient states that she got sick last week.  Was seen in the ER on January 26.  She had palpitations that time.  Workup was negative.  Patient discharged to home.  She followed up in the office and was diagnosed with flulike symptoms.  She had a respiratory viral panel that was negative.  She continued to have diarrhea along with nausea and vomiting.  She states that she felt some tightness in her chest and her abdomen.  She has been vomiting.  She felt some palpitations in the middle of the night.  She came to the ER 02/03 for evaluation.  02/03: to ED evening - elevated BNP, procalcitonin, troponin 283 --> 331 started on heparin , lactate 2.6 -(1L IVF)--> 1.7, started on broad spectrum abx cefepime  + vanc + flagyl .  02/04: admitted to hospitalist service w/ concern for gastroenteritis w/ concern for associated sepsis (reasonably ruled out UTI, PNA, colitis or other intraabdominal bacterial infection).  02/05: unfortunately (+)TEE for mitral valve vegetation. ID consulted. Pt reports diarrhea has improved a lot today     Consultants:  Cardiology   Procedures/Surgeries: TEE 06/11/23       ASSESSMENT & PLAN:   Endocarditis  Mitral valve endocarditis, bioprosthetic valve  (+)BCx Confirmed on TEE showing mobile opacity on posterior leaflet mitral valve prosthetic valve.  Infection likely caught relatively early given no significant valve regurgitation, grossly no abscess formation though will need to be monitored closely  Hx: June 2023  admitted to [facility] Streptococcus sanguinous bacteremia from infected tooth with mitral valve vegetation, at that time underwent mitral valve replacement with bioprosthetic valve ID consult  For any worsening valve damage, will need CT surgery consultation  PICC line for long term abx  Acute gastroenteritis - improving  Clinically, concern for norovirus vs other viral illness  check stool studies --> negative / nothing detected  enteric precautions either way  IV hydration d/c as able  Diet adv as tolerated    Sepsis with acute organ dysfunction without septic shock  On admission, she has sepsis with acute organ dysfunction without septic shock.  Evidenced by a white count of 43,000, elevated lactic acid of 2.5, a weeks worth of diarrhea.   empirically started on cefepime , vancomycin  and Flagyl .  Continue for now on cefepim/flagyl  pending ID consult  Follow BCx Follow stool panel.   Demand ischemia  Elevated troponin Favor demand ischarmia over NSTEMI trend troponins.   IV heparin , hold home xarelto   Cardiology consulted- Until we have a better understanding of her leukocytosis and potential infection, I would be reluctant to pursue cardiac catheterization unless Tina Peters has angina with dynamic EKG changes or significant rise in troponin.  Abnormal Transthoracic Echocardiogram Echo (TTE) while not definitive for prosthetic valve endocarditis, is concerning with apparent leaflet thickening as well as increased gradient compared to her initial postoperative echocardiogram at [facility]. Follow BCx  transesophageal echocardiogram to better understand her mitral valve prosthesis and ensure there is not evidence of a vegetation or pannus formation check an ESR and CRP.  Hypokalemia D/t diarrhea Replace as needed Monitor BMP   Chronic a-fib  On admission, EKG shows normal sinus rhythm.   Xarelto  for anticoagulation at home - held while on heparin   Continue home Toprol -XL rate control  with hold parameters Follow w/ cardiology - discussion about possible ablation given her chronic A-fib. continue home amiodarone .     On amiodarone  Thyroid  panel per cardiology   HFrEF (heart failure with reduced ejection fraction)  On admission, patient has actually intra vascular volume depleted.   Continue with IV fluids.   Hold her Aldactone , Cozaar  for now given soft BP    Stage 3a chronic kidney disease (HCC) - Baseline creatinine 1.1-1.4 On admission, patient has a history of CKD stage IIIa.  Baseline creatinine 1.1-1.4.  Cr on admission is close to baseline, no AKI Hold her Aldactone  and her losartan  for now.   OSA (obstructive sleep apnea) On admission continue CPAP   Essential hypertension Home blood pressure medications include Aldactone , Toprol -XL, Cozaar .   hold her Cozaar  and Aldactone  due to dehydration.   continue Toprol -XL with hold parameters    Class 2 obesity based on BMI: Body mass index is 37.42 kg/m.  Underweight - under 18  overweight - 25 to 29 obese - 30 or more Class 1 obesity: BMI of 30.0 to 34 Class 2 obesity: BMI of 35.0 to 39 Class 3 obesity: BMI of 40.0 to 49 Super Morbid Obesity: BMI 50-59 Super-super Morbid Obesity: BMI 60+ Significantly low or high BMI is associated with higher medical risk.  Weight management advised as adjunct to other disease management and risk reduction treatments    DVT prophylaxis: heparin  IV fluids: no continuous IV fluids  Nutrition: NPO for procedure, advance as tolerated following back to floor  Central lines / invasive devices: none  Code Status: FULL CODE ACP documentation reviewed:  none on file in [facility]  TOC needs: TBD Barriers to dispo / significant pending items: IV abx, PICC line, expect will be here several days pending ID clearance and abx plan prior to discharge              Subjective / Brief ROS:  Patient reports diarrhea has improved today  Denies CP/SOB.  Pain controlled.   Denies new weakness. .   Family Communication: spoke on phone w/ daughter 06/11/23 5:05 PM all questions answered     Objective Findings:  Vitals:   06/11/23 1345 06/11/23 1359 06/11/23 1400 06/11/23 1503  BP: (!) 104/43 101/67 (!) 113/49 (!) 104/35  Pulse:    (!) 59  Resp: (!) 36   18  Temp:    98.3 F (36.8 C)  TempSrc:      SpO2: 99% 97%  97%  Weight:      Height:        Intake/Output Summary (Last 24 hours) at 06/11/2023 1705 Last data filed at 06/11/2023 1311 Gross per 24 hour  Intake 1201.5 ml  Output --  Net 1201.5 ml   Filed Weights   06/09/23 2135 06/11/23 1205  Weight: 102 kg 102 kg    Examination:  Physical Exam Constitutional:      General: She is not in acute distress. Cardiovascular:     Rate and Rhythm: Normal rate and regular rhythm.  Pulmonary:     Effort: Pulmonary effort is normal.     Breath sounds: Normal breath sounds.  Neurological:     General: No focal deficit present.     Mental Status: She is alert. Mental status  is at baseline.          Scheduled Medications:   amiodarone   200 mg Oral Daily   aspirin  EC  81 mg Oral Daily   metoprolol  succinate  25 mg Oral Daily   sertraline   50 mg Oral Daily    Continuous Infusions:  cefTRIAXone  (ROCEPHIN )  IV 2 g (06/11/23 0001)   heparin  1,600 Units/hr (06/11/23 1351)   metronidazole       PRN Medications:  acetaminophen  **OR** acetaminophen , ondansetron  **OR** ondansetron  (ZOFRAN ) IV  Antimicrobials from admission:  Anti-infectives (From admission, onward)    Start     Dose/Rate Route Frequency Ordered Stop   06/11/23 2200  metroNIDAZOLE  (FLAGYL ) IVPB 500 mg        500 mg 100 mL/hr over 60 Minutes Intravenous Every 12 hours 06/11/23 1628     06/11/23 0015  cefTRIAXone  (ROCEPHIN ) 2 g in sodium chloride  0.9 % 100 mL IVPB        2 g 200 mL/hr over 30 Minutes Intravenous Every 24 hours 06/10/23 2320     06/11/23 0000  metroNIDAZOLE  (FLAGYL ) tablet 500 mg  Status:  Discontinued         500 mg Oral Every 12 hours 06/10/23 2320 06/11/23 1628   06/09/23 2245  ceFEPIme  (MAXIPIME ) 2 g in sodium chloride  0.9 % 100 mL IVPB        2 g 200 mL/hr over 30 Minutes Intravenous  Once 06/09/23 2238 06/09/23 2324   06/09/23 2245  metroNIDAZOLE  (FLAGYL ) IVPB 500 mg        500 mg 100 mL/hr over 60 Minutes Intravenous  Once 06/09/23 2238 06/10/23 0035   06/09/23 2245  vancomycin  (VANCOCIN ) IVPB 1000 mg/200 mL premix        1,000 mg 200 mL/hr over 60 Minutes Intravenous  Once 06/09/23 2238 06/10/23 0035           Data Reviewed:  I have personally reviewed the following...  CBC: Recent Labs  Lab 06/09/23 2153 06/10/23 1531 06/11/23 0635  WBC 43.3* 22.2* 16.8*  NEUTROABS 38.8* 20.1* 13.8*  HGB 10.2* 9.4* 8.5*  HCT 31.4* 29.7* 27.5*  MCV 82.2 84.9 84.1  PLT 186 178 160   Basic Metabolic Panel: Recent Labs  Lab 06/09/23 2153 06/10/23 1531 06/11/23 0635  NA 133* 137 135  K 2.8* 3.7 3.6  CL 97* 103 106  CO2 20* 24 22  GLUCOSE 139* 130* 99  BUN 17 15 13   CREATININE 1.14* 1.12* 1.02*  CALCIUM  8.1* 7.9* 7.8*  MG 1.8 2.3  --    GFR: Estimated Creatinine Clearance: 59 mL/min (A) (by C-G formula based on SCr of 1.02 mg/dL (H)). Liver Function Tests: Recent Labs  Lab 06/09/23 2153 06/11/23 0635  AST 53* 27  ALT 27 19  ALKPHOS 107 77  BILITOT 1.8* 1.0  PROT 6.8 5.7*  ALBUMIN 2.9* 2.4*   Recent Labs  Lab 06/09/23 2153  LIPASE 22   No results for input(s): AMMONIA in the last 168 hours. Coagulation Profile: Recent Labs  Lab 06/10/23 0359  INR 1.6*   Cardiac Enzymes: No results for input(s): CKTOTAL, CKMB, CKMBINDEX, TROPONINI in the last 168 hours. BNP (last 3 results) No results for input(s): PROBNP in the last 8760 hours. HbA1C: No results for input(s): HGBA1C in the last 72 hours. CBG: No results for input(s): GLUCAP in the last 168 hours. Lipid Profile: No results for input(s): CHOL, HDL, LDLCALC, TRIG, CHOLHDL,  LDLDIRECT in the last 72 hours. Thyroid  Function Tests:  Recent Labs    06/10/23 1553 06/10/23 2253  TSH 0.890 1.423  FREET4 2.01*  --    Anemia Panel: No results for input(s): VITAMINB12, FOLATE, FERRITIN, TIBC, IRON, RETICCTPCT in the last 72 hours. Most Recent Urinalysis On File:     Component Value Date/Time   COLORURINE YELLOW (A) 06/09/2023 2153   APPEARANCEUR CLEAR (A) 06/09/2023 2153   LABSPEC 1.028 06/09/2023 2153   PHURINE 6.0 06/09/2023 2153   GLUCOSEU NEGATIVE 06/09/2023 2153   HGBUR SMALL (A) 06/09/2023 2153   BILIRUBINUR NEGATIVE 06/09/2023 2153   KETONESUR NEGATIVE 06/09/2023 2153   PROTEINUR NEGATIVE 06/09/2023 2153   NITRITE NEGATIVE 06/09/2023 2153   LEUKOCYTESUR NEGATIVE 06/09/2023 2153   Sepsis Labs: @LABRCNTIP (procalcitonin:4,lacticidven:4) Microbiology: Recent Results (from the past 240 hours)  Respiratory virus panel     Status: None   Collection Time: 06/03/23  3:57 PM   Specimen: Nasal Swab; Respiratory  Result Value Ref Range Status   Adenovirus B Not Detected Not Detected Final   Rhinovirus Not Detected Not Detected Final   Influenza A Not Detected Not Detected Final   INFLUENZA A SUBTYPE H1 Not Detected Not Detected Final   INFLUENZA A SUBTYPE H3 Not Detected Not Detected Final   Influenza B Not Detected Not Detected Final   Metapneumovirus Not Detected Not Detected Final   Respiratory Syncytial Virus A Not Detected Not Detected Final   Respiratory Syncytial Virus B Not Detected Not Detected Final   HUMAN PARAINFLU VIRUS 1 Not Detected Not Detected Final   HUMAN PARAINFLU VIRUS 2 Not Detected Not Detected Final   HUMAN PARAINFLU VIRUS 3 Not Detected Not Detected Final   Comment see note  Final    Comment: THIS ASSAY WILL NOT DETECT SARS-CoV-2 (COVID-19) . This test is performed using the [employer]. . . A positive Influenza A associated with negative H1 or H3 subtyping results do not preclude influenza A  virus H1/ H3 infections. Low level positive Influenza A may result in unsubtypeable seasonal Influenza. Samples can be submitted for further testing at state public health laboratories if available. . The manufacturer of a reagent used for this assay has informed laboratories that human metapneumovirus positive results obtained performing this test, especially when detected in a co-infection, should be confirmed with an alternative molecular human metapneumovirus assay, if medically indicated. The results of this test should not be used as the sole basis for diagnosis, treatment, or other patient management decisions. For inquiries, please contact [phone] ([phone]). . A positive Influenza A associated with negative H1 or H3 subtypin g results do not preclude influenza A virus H1/H3 infections. Low level positive Influenza A may result in unsubtypeable seasonal Influenza. Samples can be submitted for further testing at state public health laboratories if available. . Limitations: A negative result does not rule out respiratory pathogen infection below the sensitivity limit of the assay. The sensitivity depends on pathogen and sample type. . This assay cannot reliably distinguish between Rhinovirus and Enterovirus due to genetic similarities between the viruses. .   Gastrointestinal Panel by PCR , Stool     Status: None   Collection Time: 06/09/23 12:42 PM   Specimen: Stool  Result Value Ref Range Status   Campylobacter species NOT DETECTED NOT DETECTED Final   Plesimonas shigelloides NOT DETECTED NOT DETECTED Final   Salmonella species NOT DETECTED NOT DETECTED Final   Yersinia enterocolitica NOT DETECTED NOT DETECTED Final   Vibrio species NOT DETECTED NOT DETECTED Final   Vibrio cholerae NOT  DETECTED NOT DETECTED Final   Enteroaggregative E coli (EAEC) NOT DETECTED NOT DETECTED Final   Enteropathogenic E coli (EPEC) NOT DETECTED NOT DETECTED Final    Enterotoxigenic E coli (ETEC) NOT DETECTED NOT DETECTED Final   Shiga like toxin producing E coli (STEC) NOT DETECTED NOT DETECTED Final   Shigella/Enteroinvasive E coli (EIEC) NOT DETECTED NOT DETECTED Final   Cryptosporidium NOT DETECTED NOT DETECTED Final   Cyclospora cayetanensis NOT DETECTED NOT DETECTED Final   Entamoeba histolytica NOT DETECTED NOT DETECTED Final   Giardia lamblia NOT DETECTED NOT DETECTED Final   Adenovirus F40/41 NOT DETECTED NOT DETECTED Final   Astrovirus NOT DETECTED NOT DETECTED Final   Norovirus GI/GII NOT DETECTED NOT DETECTED Final   Rotavirus A NOT DETECTED NOT DETECTED Final   Sapovirus (I, II, IV, and V) NOT DETECTED NOT DETECTED Final    Comment: Performed at [facility], [address] [place], [place] [address]  Resp panel by RT-PCR (RSV, Flu A&B, Covid) Anterior Nasal Swab     Status: None   Collection Time: 06/09/23  9:53 PM   Specimen: Anterior Nasal Swab  Result Value Ref Range Status   SARS Coronavirus 2 by RT PCR NEGATIVE NEGATIVE Final    Comment: (NOTE) SARS-CoV-2 target nucleic acids are NOT DETECTED.  The SARS-CoV-2 RNA is generally detectable in upper respiratory specimens during the acute phase of infection. The lowest concentration of SARS-CoV-2 viral copies this assay can detect is 138 copies/mL. A negative result does not preclude SARS-Cov-2 infection and should not be used as the sole basis for treatment or other patient management decisions. A negative result may occur with  improper specimen collection/handling, submission of specimen other than nasopharyngeal swab, presence of viral mutation(s) within the areas targeted by this assay, and inadequate number of viral copies(<138 copies/mL). A negative result must be combined with clinical observations, patient history, and epidemiological information. The expected result is Negative.  Fact Sheet for Patients:  bloggercourse.com  Fact Sheet  for Healthcare Providers:  seriousbroker.it  This test is no t yet approved or cleared by the [place]  FDA and  has been authorized for detection and/or diagnosis of SARS-CoV-2 by FDA under an Emergency Use Authorization (EUA). This EUA will remain  in effect (meaning this test can be used) for the duration of the COVID-19 declaration under Section 564(b)(1) of the Act, 21 U.S.C.section 360bbb-3(b)(1), unless the authorization is terminated  or revoked sooner.       Influenza A by PCR NEGATIVE NEGATIVE Final   Influenza B by PCR NEGATIVE NEGATIVE Final    Comment: (NOTE) The Xpert Xpress SARS-CoV-2/FLU/RSV plus assay is intended as an aid in the diagnosis of influenza from Nasopharyngeal swab specimens and should not be used as a sole basis for treatment. Nasal washings and aspirates are unacceptable for Xpert Xpress SARS-CoV-2/FLU/RSV testing.  Fact Sheet for Patients: bloggercourse.com  Fact Sheet for Healthcare Providers: seriousbroker.it  This test is not yet approved or cleared by the [place]  FDA and has been authorized for detection and/or diagnosis of SARS-CoV-2 by FDA under an Emergency Use Authorization (EUA). This EUA will remain in effect (meaning this test can be used) for the duration of the COVID-19 declaration under Section 564(b)(1) of the Act, 21 U.S.C. section 360bbb-3(b)(1), unless the authorization is terminated or revoked.     Resp Syncytial Virus by PCR NEGATIVE NEGATIVE Final    Comment: (NOTE) Fact Sheet for Patients: bloggercourse.com  Fact Sheet for Healthcare Providers: seriousbroker.it  This test is not yet approved or cleared by the [place]  FDA and has been authorized for detection and/or diagnosis of SARS-CoV-2 by FDA under an Emergency Use Authorization (EUA). This EUA will remain in effect (meaning  this test can be used) for the duration of the COVID-19 declaration under Section 564(b)(1) of the Act, 21 U.S.C. section 360bbb-3(b)(1), unless the authorization is terminated or revoked.  Performed at [facility], [address] [place], [place] [address]   Culture, blood (single)     Status: None (Preliminary result)   Collection Time: 06/09/23 10:59 PM   Specimen: BLOOD LEFT ARM  Result Value Ref Range Status   Specimen Description   Final    BLOOD LEFT ARM Performed at [facility], [address], [place], [place] [address]    Special Requests   Final    BOTTLES DRAWN AEROBIC AND ANAEROBIC Blood Culture results may not be optimal due to an inadequate volume of blood received in culture bottles Performed at [facility], [address] [place], [place] [address]    Culture  Setup Time   Final    ANAEROBIC BOTTLE ONLY GRAM POSITIVE COCCI Organism ID to follow CRITICAL RESULT CALLED TO, READ BACK BY AND VERIFIED WITH: ESTILL LUTES PHARMD [id number] 06/11/23 HNM GRAM STAIN REVIEWED-AGREE WITH RESULT DRT Performed at [facility], [address], [place], [place] [address]    Culture GRAM POSITIVE COCCI  Final   Report Status PENDING  Incomplete  Blood Culture ID Panel (Reflexed)     Status: None   Collection Time: 06/09/23 10:59 PM  Result Value Ref Range Status   Enterococcus faecalis NOT DETECTED NOT DETECTED Final   Enterococcus Faecium NOT DETECTED NOT DETECTED Final   Listeria monocytogenes NOT DETECTED NOT DETECTED Final   Staphylococcus species NOT DETECTED NOT DETECTED Final   Staphylococcus aureus (BCID) NOT DETECTED NOT DETECTED Final   Staphylococcus epidermidis NOT DETECTED NOT DETECTED Final   Staphylococcus lugdunensis NOT DETECTED NOT DETECTED Final   Streptococcus species NOT DETECTED NOT DETECTED Final   Streptococcus agalactiae NOT DETECTED NOT DETECTED Final   Streptococcus pneumoniae NOT DETECTED NOT DETECTED Final    Streptococcus pyogenes NOT DETECTED NOT DETECTED Final   A.calcoaceticus-baumannii NOT DETECTED NOT DETECTED Final   Bacteroides fragilis NOT DETECTED NOT DETECTED Final   Enterobacterales NOT DETECTED NOT DETECTED Final   Enterobacter cloacae complex NOT DETECTED NOT DETECTED Final   Escherichia coli NOT DETECTED NOT DETECTED Final   Klebsiella aerogenes NOT DETECTED NOT DETECTED Final   Klebsiella oxytoca NOT DETECTED NOT DETECTED Final   Klebsiella pneumoniae NOT DETECTED NOT DETECTED Final   Proteus species NOT DETECTED NOT DETECTED Final   Salmonella species NOT DETECTED NOT DETECTED Final   Serratia marcescens NOT DETECTED NOT DETECTED Final   Haemophilus influenzae NOT DETECTED NOT DETECTED Final   Neisseria meningitidis NOT DETECTED NOT DETECTED Final   Pseudomonas aeruginosa NOT DETECTED NOT DETECTED Final   Stenotrophomonas maltophilia NOT DETECTED NOT DETECTED Final   Candida albicans NOT DETECTED NOT DETECTED Final   Candida auris NOT DETECTED NOT DETECTED Final   Candida glabrata NOT DETECTED NOT DETECTED Final   Candida krusei NOT DETECTED NOT DETECTED Final   Candida parapsilosis NOT DETECTED NOT DETECTED Final   Candida tropicalis NOT DETECTED NOT DETECTED Final   Cryptococcus neoformans/gattii NOT DETECTED NOT DETECTED Final    Comment: Performed at [facility], [address] [place], [place] [address]  Blood  culture (single)     Status: None (Preliminary result)   Collection Time: 06/10/23 12:53 AM   Specimen: BLOOD  Result Value Ref Range Status   Specimen Description BLOOD RIGHT ARM  Final   Special Requests   Final    BOTTLES DRAWN AEROBIC AND ANAEROBIC Blood Culture results may not be optimal due to an inadequate volume of blood received in culture bottles   Culture   Final    NO GROWTH 1 DAY Performed at [facility], [address] [place], [place] [address]    Report Status PENDING  Incomplete      Radiology Studies last 3  days: ECHO TEE Result Date: 06/11/2023    TRANSESOPHOGEAL ECHO REPORT   Patient Name:   Tina Peters Date of Exam: 06/11/2023 Medical Rec #:  [id number]       Height:       65.0 in Accession #:    [id number]      Weight:       224.9 lb Date of Birth:  March 25, 1951       BSA:          2.079 m Patient Age:    72 years        BP:           95/59 mmHg Patient Gender: F               HR:           67 bpm. 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 Risk Factors:Hypertension. S/P Bovine mitral                  valve replacement.                   Mitral Valve: bioprosthetic valve valve is present in the                  mitral position.  Sonographer:     Christopher Furnace Referring Phys:  [id number] CHRISTOPHER RONALD BERGE Diagnosing Phys: Evalene Lunger MD PROCEDURE: After discussion of the risks and benefits of a TEE, an informed consent was obtained from the patient. TEE procedure time was 30 minutes. The transesophogeal probe was passed without difficulty through the esophogus of the patient. Local oropharyngeal anesthetic was provided with Cetacaine  and viscous lidocaine . Sedation performed by different physician. Image quality was excellent. The patient's vital signs; including heart rate, blood pressure, and oxygen saturation; remained stable throughout the procedure. The patient developed no complications during the procedure.  IMPRESSIONS  1. Left ventricular ejection fraction, by estimation, is 55 to 60%. The left ventricle has normal function. The left ventricle has no regional wall motion abnormalities.  2. Right ventricular systolic function is mildly reduced. The right ventricular size is normal.  3. Left atrial size was severely dilated. No left atrial/left  atrial appendage thrombus was detected.  4. Right atrial size was mildly dilated.  5. The mitral valve has been repaired/replaced. No evidence of mitral valve regurgitation. Mild mitral stenosis. The mean mitral valve gradient is 7.0 mmHg. There  is a bioprosthetic valve present in the mitral position. Echo findings are consistent with  vegetation of the posterior mitral prosthesis, with significant thickening and a small mobile opacity coming from the posterior leaflet.  6. Tricuspid valve regurgitation is moderate.  7. The aortic valve is normal in structure. There is mild calcification of the aortic valve. Aortic valve regurgitation is not visualized. Aortic valve sclerosis is present, with no evidence of aortic valve stenosis.  8. The inferior vena cava is normal in size with greater than 50% respiratory variability, suggesting right atrial pressure of 3 mmHg.  9. Agitated saline contrast bubble study was negative, with no evidence of any interatrial shunt. Conclusion(s)/Recommendation(s): Thickening of MV posterior leflet with mobile vegetation noted FINDINGS  Left Ventricle: Left ventricular ejection fraction, by estimation, is 55 to 60%. The left ventricle has normal function. The left ventricle has no regional wall motion abnormalities. The left ventricular internal cavity size was normal in size. There is  no left ventricular hypertrophy. Right Ventricle: The right ventricular size is normal. No increase in right ventricular wall thickness. Right ventricular systolic function is mildly reduced. Left Atrium: Left atrial size was severely dilated. No left atrial/left atrial appendage thrombus was detected. Right Atrium: Right atrial size was mildly dilated. Pericardium: There is no evidence of pericardial effusion. Mitral Valve: The mitral valve has been repaired/replaced. There is severe thickening of the mitral valve leaflet(s). No evidence of mitral valve regurgitation. There is a bioprosthetic valve  present in the mitral position. Echo findings are consistent with vegetation on the mitral prosthesis. Mild mitral valve stenosis. MV peak gradient, 22.3 mmHg. The mean mitral valve gradient is 7.0 mmHg. Tricuspid Valve: The tricuspid valve is normal in structure. Tricuspid valve regurgitation is moderate . No evidence of tricuspid stenosis. Aortic Valve: The aortic valve is normal in structure. There is mild calcification of the aortic valve. Aortic valve regurgitation is not visualized. Aortic valve sclerosis is present, with no evidence of aortic valve stenosis. Pulmonic Valve: The pulmonic valve was normal in structure. Pulmonic valve regurgitation is not visualized. No evidence of pulmonic stenosis. Aorta: The aortic root is normal in size and structure. Venous: The inferior vena cava is normal in size with greater than 50% respiratory variability, suggesting right atrial pressure of 3 mmHg. IAS/Shunts: No atrial level shunt detected by color flow Doppler. Agitated saline contrast was given intravenously to evaluate for intracardiac shunting. Agitated saline contrast bubble study was negative, with no evidence of any interatrial shunt. There  is no evidence of a patent foramen ovale. There is no evidence of an atrial septal defect.  MITRAL VALVE MV Area (PHT): 1.46 cm MV Peak grad:  22.3 mmHg MV Mean grad:  7.0 mmHg MV Vmax:       2.36 m/s MV Vmean:      107.0 cm/s MV Decel Time: 518 msec MV E velocity: 198.00 cm/s MV A velocity: 41.20 cm/s MV E/A ratio:  4.81 Evalene Lunger MD Electronically signed by Evalene Lunger MD Signature Date/Time: 06/11/2023/4:44:40 PM    Final    ECHOCARDIOGRAM COMPLETE Result Date: 06/10/2023    ECHOCARDIOGRAM REPORT   Patient Name:   Tina Peters Date of Exam: 06/10/2023 Medical Rec #:  [id number]       Height:       65.0 in Accession #:    [id number]      Weight:       224.9 lb Date of Birth:  06/27/1950  BSA:          2.079 m Patient Age:    72 years        BP:            97/55 mmHg Patient Gender: F               HR:           72 bpm. Exam Location:  [facility] Procedure: 2D Echo, Cardiac Doppler and Color Doppler Indications:     Chest pain R07.9  History:         Patient has prior history of Echocardiogram examinations, most                  recent 07/24/2022. Risk Factors:Hypertension and Dyslipidemia.                  S/P Bovine mitral valve replacement.  Sonographer:     Christopher Furnace Referring Phys:  [id number] CHRISTOPHER END Diagnosing Phys: Lonni Hanson MD IMPRESSIONS  1. Left ventricular ejection fraction, by estimation, is 50 to 55%. The left ventricle has low normal function. The left ventricle has no regional wall motion abnormalities. Left ventricular diastolic function could not be evaluated.  2. Right ventricular systolic function is mildly reduced. The right ventricular size is mildly enlarged.  3. Left atrial size was severely dilated.  4. Right atrial size was mildly dilated.  5. The mitral valve has been repaired/replaced. No evidence of mitral valve regurgitation. Moderate to severe mitral stenosis. The mean mitral valve gradient is 11.0 mmHg.  6. Tricuspid valve regurgitation is mild to moderate.  7. The aortic valve is tricuspid. There is mild thickening of the aortic valve. Aortic valve regurgitation is not visualized. Aortic valve sclerosis/calcification is present, without any evidence of aortic stenosis. Conclusion(s)/Recommendation(s): Mitral valve prosthesis appears thickened with increased mean gradient since last echo shortly after implantation in 10/2021 at [facility] (mean gratient 5 -> 11 mmHg). Endocarditis and/or pannus formation cannot be excluded, though a mobile vegetation is not clearly seen. Consider further evaluation by transesophageal echocardiogram. FINDINGS  Left Ventricle: Left ventricular ejection fraction, by estimation, is 50 to 55%. The left ventricle has low normal function. The left ventricle has no regional wall motion abnormalities. The left  ventricular internal cavity size was normal in size. There is borderline left ventricular hypertrophy. Left ventricular diastolic function could not be evaluated due to mitral valve replacement. Left ventricular diastolic function could not be evaluated. Right Ventricle: The right ventricular size is mildly enlarged. No increase in right ventricular wall thickness. Right ventricular systolic function is mildly reduced. Left Atrium: Left atrial size was severely dilated. Right Atrium: Right atrial size was mildly dilated. Pericardium: There is no evidence of pericardial effusion. Mitral Valve: The mitral valve has been repaired/replaced. No evidence of mitral valve regurgitation. There is a 27 mm Mitris bioprosthetic valve present in the mitral position. Moderate to severe mitral valve stenosis. MV peak gradient, 25.8 mmHg. The mean mitral valve gradient is 11.0 mmHg. Tricuspid Valve: The tricuspid valve is normal in structure. Tricuspid valve regurgitation is mild to moderate. Aortic Valve: The aortic valve is tricuspid. There is mild thickening of the aortic valve. There is mild aortic valve annular calcification. Aortic valve regurgitation is not visualized. Aortic valve sclerosis/calcification is present, without any evidence of aortic stenosis. Aortic valve mean gradient measures 5.0 mmHg. Aortic valve peak gradient measures 10.1 mmHg. Aortic valve area, by VTI measures 1.53 cm. Pulmonic Valve: The pulmonic valve was  normal in structure. Pulmonic valve regurgitation is mild to moderate. No evidence of pulmonic stenosis. Aorta: The aortic root is normal in size and structure. Pulmonary Artery: The pulmonary artery is of normal size. IAS/Shunts: No atrial level shunt detected by color flow Doppler.  LEFT VENTRICLE PLAX 2D LVIDd:         5.00 cm LVIDs:         3.20 cm LV PW:         1.10 cm LV IVS:        1.00 cm LVOT diam:     2.00 cm LV SV:         45 LV SV Index:   22 LVOT Area:     3.14 cm  RIGHT VENTRICLE RV  Basal diam:  4.30 cm RV Mid diam:    3.60 cm RV S prime:     11.00 cm/s TAPSE (M-mode): 2.0 cm LEFT ATRIUM              Index        RIGHT ATRIUM           Index LA diam:        4.60 cm  2.21 cm/m   RA Area:     23.30 cm LA Vol (A2C):   107.0 ml 51.46 ml/m  RA Volume:   73.70 ml  35.45 ml/m LA Vol (A4C):   119.0 ml 57.23 ml/m LA Biplane Vol: 114.0 ml 54.83 ml/m  AORTIC VALVE                     PULMONIC VALVE AV Area (Vmax):    1.55 cm      PR End Diast Vel: 11.16 msec AV Area (Vmean):   1.62 cm AV Area (VTI):     1.53 cm AV Vmax:           158.67 cm/s AV Vmean:          103.633 cm/s AV VTI:            0.293 m AV Peak Grad:      10.1 mmHg AV Mean Grad:      5.0 mmHg LVOT Vmax:         78.20 cm/s LVOT Vmean:        53.300 cm/s LVOT VTI:          0.143 m LVOT/AV VTI ratio: 0.49  AORTA Ao Root diam: 3.00 cm MITRAL VALVE                TRICUSPID VALVE MV Area (PHT): 1.26 cm     TR Peak grad:   35.5 mmHg MV Area VTI:   0.52 cm     TR Vmax:        298.00 cm/s MV Peak grad:  25.8 mmHg MV Mean grad:  11.0 mmHg    SHUNTS MV Vmax:       2.54 m/s     Systemic VTI:  0.14 m MV Vmean:      152.0 cm/s   Systemic Diam: 2.00 cm MV Decel Time: 604 msec MV E velocity: 188.00 cm/s Lonni Hanson MD Electronically signed by Lonni Hanson MD Signature Date/Time: 06/10/2023/10:36:59 AM    Final    US  ABDOMEN LIMITED RUQ (LIVER/GB) Result Date: 06/10/2023 CLINICAL DATA:  [id number]. Gallstones on CT today. Patient undergoing workup for sepsis. EXAM: ULTRASOUND ABDOMEN LIMITED RIGHT UPPER QUADRANT COMPARISON:  CT earlier today. FINDINGS: Gallbladder: There is slight gallbladder dilatation. Multiple  layering subcentimeter stones are noted in the proximal lumen intermixed with sludge. There is no free wall thickening or positive sonographic Murphy's sign, no pericholecystic fluid. Common bile duct: Diameter: 1.4 mm with no intrahepatic bile duct dilatation. Liver: No focal lesion identified. There is mild increased hepatic  echogenicity consistent with steatosis. Portal vein is patent on color Doppler imaging with normal direction of blood flow towards the liver. Other: None. IMPRESSION: 1. Cholelithiasis and gallbladder sludge with slight gallbladder dilatation. No free wall thickening or positive sonographic Murphy's sign. 2. No biliary ductal dilatation. 3. Mild hepatic steatosis. Electronically Signed   By: Francis Quam M.D.   On: 06/10/2023 03:02   CT CHEST ABDOMEN PELVIS W CONTRAST Result Date: 06/10/2023 CLINICAL DATA:  Sepsis EXAM: CT CHEST, ABDOMEN, AND PELVIS WITH CONTRAST TECHNIQUE: Multidetector CT imaging of the chest, abdomen and pelvis was performed following the standard protocol during bolus administration of intravenous contrast. RADIATION DOSE REDUCTION: This exam was performed according to the departmental dose-optimization program which includes automated exposure control, adjustment of the mA and/or kV according to patient size and/or use of iterative reconstruction technique. CONTRAST:  OMNIPAQUE  IOHEXOL  300 MG/ML  SOLN COMPARISON:  None Available. FINDINGS: CT CHEST FINDINGS Cardiovascular: Mild cardiomegaly. Aortic valve replacement. Mild calcific aortic atherosclerosis. No pericardial effusion. Mediastinum/Nodes: No enlarged mediastinal, hilar, or axillary lymph nodes. Thyroid  gland, trachea, and esophagus demonstrate no significant findings. Lungs/Pleura: Mild left basilar atelectasis. Otherwise unremarkable. Musculoskeletal: No chest wall mass or suspicious bone lesions identified. CT ABDOMEN PELVIS FINDINGS Hepatobiliary: Normal hepatic contours. No intra- or extrahepatic biliary dilatation. There is cholelithiasis without acute inflammation. Pancreas: Normal pancreas. No ductal dilatation or peripancreatic fluid collection. Spleen: Normal. Adrenals/Urinary Tract: The adrenal glands are normal. No hydronephrosis, nephroureterolithiasis or solid renal mass. The urinary bladder is normal for degree  of distention Stomach/Bowel: There is no hiatal hernia. Normal duodenal course and caliber. No small bowel dilatation or inflammation. Rectosigmoid diverticulosis without acute inflammation. Normal appendix. Vascular/Lymphatic: There is calcific atherosclerosis of the abdominal aorta. No lymphadenopathy. Reproductive: Normal prostate size with symmetric seminal vesicles. Other: None. Musculoskeletal: No bony spinal canal stenosis or focal osseous abnormality. IMPRESSION: 1. No acute abnormality of the chest, abdomen or pelvis. 2. Cholelithiasis without acute inflammation 3. Rectosigmoid diverticulosis without acute inflammation. Electronically Signed   By: Franky Stanford M.D.   On: 06/10/2023 00:54        Courtez Twaddle, DO [facility] 06/11/2023, 5:05 PM    Dictation software may have been used to generate the above note. Typos may occur and escape review in typed/dictated notes. Please contact Dr Marsa directly for clarity if needed.  Staff may message me via secure chat in Epic  but this may not receive an immediate response,  please page me for urgent matters!  If 7PM-7AM, please contact night coverage [email or web address]

## 2023-06-11 NOTE — Plan of Care (Signed)
   Problem: Clinical Measurements: Goal: Ability to maintain clinical measurements within normal limits will improve Outcome: Progressing   Problem: Activity: Goal: Risk for activity intolerance will decrease Outcome: Progressing   Problem: Elimination: Goal: Will not experience complications related to urinary retention Outcome: Progressing   Problem: Pain Managment: Goal: General experience of comfort will improve and/or be controlled Outcome: Progressing

## 2023-06-12 ENCOUNTER — Telehealth (HOSPITAL_COMMUNITY): Payer: Self-pay | Admitting: Pharmacy Technician

## 2023-06-12 ENCOUNTER — Inpatient Hospital Stay: Payer: Medicare HMO

## 2023-06-12 ENCOUNTER — Other Ambulatory Visit (HOSPITAL_COMMUNITY): Payer: Self-pay

## 2023-06-12 ENCOUNTER — Encounter: Payer: Self-pay | Admitting: Cardiovascular Disease

## 2023-06-12 DIAGNOSIS — I480 Paroxysmal atrial fibrillation: Secondary | ICD-10-CM

## 2023-06-12 DIAGNOSIS — Z9889 Other specified postprocedural states: Secondary | ICD-10-CM

## 2023-06-12 DIAGNOSIS — R112 Nausea with vomiting, unspecified: Secondary | ICD-10-CM

## 2023-06-12 DIAGNOSIS — G4733 Obstructive sleep apnea (adult) (pediatric): Secondary | ICD-10-CM

## 2023-06-12 DIAGNOSIS — K529 Noninfective gastroenteritis and colitis, unspecified: Secondary | ICD-10-CM | POA: Diagnosis not present

## 2023-06-12 DIAGNOSIS — R7881 Bacteremia: Secondary | ICD-10-CM

## 2023-06-12 DIAGNOSIS — I330 Acute and subacute infective endocarditis: Secondary | ICD-10-CM

## 2023-06-12 DIAGNOSIS — E876 Hypokalemia: Secondary | ICD-10-CM | POA: Diagnosis not present

## 2023-06-12 LAB — CBC
HCT: 28.5 % — ABNORMAL LOW (ref 36.0–46.0)
Hemoglobin: 9.1 g/dL — ABNORMAL LOW (ref 12.0–15.0)
MCH: 26.7 pg (ref 26.0–34.0)
MCHC: 31.9 g/dL (ref 30.0–36.0)
MCV: 83.6 fL (ref 80.0–100.0)
Platelets: 160 10*3/uL (ref 150–400)
RBC: 3.41 MIL/uL — ABNORMAL LOW (ref 3.87–5.11)
RDW: 16.4 % — ABNORMAL HIGH (ref 11.5–15.5)
WBC: 12 10*3/uL — ABNORMAL HIGH (ref 4.0–10.5)
nRBC: 0 % (ref 0.0–0.2)

## 2023-06-12 LAB — HEPARIN LEVEL (UNFRACTIONATED)
Heparin Unfractionated: 0.36 [IU]/mL (ref 0.30–0.70)
Heparin Unfractionated: <0.1 [IU]/mL — ABNORMAL LOW (ref 0.30–0.70)

## 2023-06-12 LAB — APTT: aPTT: 90 s — ABNORMAL HIGH (ref 24–36)

## 2023-06-12 MED ORDER — ACETAMINOPHEN 325 MG PO TABS
650.0000 mg | ORAL_TABLET | Freq: Four times a day (QID) | ORAL | Status: AC
  Administered 2023-06-12 – 2023-06-14 (×8): 650 mg via ORAL
  Filled 2023-06-12 (×8): qty 2

## 2023-06-12 MED ORDER — FENTANYL CITRATE PF 50 MCG/ML IJ SOSY: 12.5000 ug | PREFILLED_SYRINGE | INTRAMUSCULAR | Status: DC | PRN

## 2023-06-12 MED ORDER — KETOROLAC TROMETHAMINE 15 MG/ML IJ SOLN
15.0000 mg | Freq: Once | INTRAMUSCULAR | Status: AC
  Administered 2023-06-12: 15 mg via INTRAVENOUS
  Filled 2023-06-12: qty 1

## 2023-06-12 MED ORDER — CYCLOBENZAPRINE HCL 5 MG PO TABS
7.5000 mg | ORAL_TABLET | Freq: Three times a day (TID) | ORAL | Status: DC | PRN
  Administered 2023-06-17: 7.5 mg via ORAL
  Filled 2023-06-12 (×2): qty 1.5

## 2023-06-12 MED ORDER — AMIODARONE HCL 200 MG PO TABS
400.0000 mg | ORAL_TABLET | Freq: Two times a day (BID) | ORAL | Status: DC
  Administered 2023-06-12 – 2023-06-16 (×10): 400 mg via ORAL
  Filled 2023-06-12 (×10): qty 2

## 2023-06-12 MED ORDER — POTASSIUM CHLORIDE CRYS ER 20 MEQ PO TBCR
40.0000 meq | EXTENDED_RELEASE_TABLET | Freq: Once | ORAL | Status: AC
  Administered 2023-06-12: 40 meq via ORAL
  Filled 2023-06-12: qty 2

## 2023-06-12 MED ORDER — METOPROLOL SUCCINATE ER 25 MG PO TB24: 25.0000 mg | ORAL_TABLET | Freq: Two times a day (BID) | ORAL | Status: DC

## 2023-06-12 MED ORDER — METOPROLOL SUCCINATE ER 25 MG PO TB24
25.0000 mg | ORAL_TABLET | Freq: Every day | ORAL | Status: DC
  Administered 2023-06-12: 25 mg via ORAL
  Filled 2023-06-12: qty 1

## 2023-06-12 MED ORDER — TRAMADOL HCL 50 MG PO TABS
50.0000 mg | ORAL_TABLET | Freq: Four times a day (QID) | ORAL | Status: DC | PRN
  Administered 2023-06-13 – 2023-06-17 (×6): 50 mg via ORAL
  Filled 2023-06-12 (×7): qty 1

## 2023-06-12 NOTE — Progress Notes (Signed)
 Upon entering the patient's room, the IV was found disconnected from the hub and running heparin  onto the floor. The floor had a considerable amount of liquid on it so the heparin  may have been running disconnected for hours. The heparin  was reconnected to the patient after cleaning the IV hubs on the tube and on the patient side. Heparin  restarted at 1725. Pharmacy notified of missed dose.

## 2023-06-12 NOTE — Progress Notes (Signed)
 Progress Note  Patient Name: Tina Peters Date of Encounter: 06/12/2023  Primary Cardiologist: Darron  Subjective   Developed Afib around 21:00 on 2/5 with ventricular rates in the low 100s bpm predominantly, rates briefly in the 120s to 140s bpm. Continues to note back pain and diarrhea. No chest pain, dyspnea, or palpitations. Hgb low, though stable at 9.1. BP sable.   Inpatient Medications    Scheduled Meds:  amiodarone   200 mg Oral Daily   aspirin  EC  81 mg Oral Daily   metoprolol  succinate  25 mg Oral Daily   sertraline   50 mg Oral Daily   Continuous Infusions:  cefTRIAXone  (ROCEPHIN )  IV 2 g (06/12/23 0039)   heparin  1,850 Units/hr (06/11/23 2227)   metronidazole  500 mg (06/11/23 2233)   PRN Meds: acetaminophen  **OR** acetaminophen , ondansetron  **OR** ondansetron  (ZOFRAN ) IV, oxyCODONE    Vital Signs    Vitals:   06/11/23 2059 06/11/23 2325 06/12/23 0352 06/12/23 0819  BP: (!) 123/47 (!) 103/58 92/65 (!) 146/58  Pulse: 100 74 93 (!) 108  Resp: 19 19 19 20   Temp: 98 F (36.7 C) 98 F (36.7 C) 97.8 F (36.6 C) 98.1 F (36.7 C)  TempSrc: Oral Oral Oral Oral  SpO2: 97% 94% 96% 98%  Weight:      Height:        Intake/Output Summary (Last 24 hours) at 06/12/2023 0847 Last data filed at 06/12/2023 0300 Gross per 24 hour  Intake 1055.29 ml  Output 7 ml  Net 1048.29 ml   Filed Weights   06/09/23 2135 06/11/23 1205  Weight: 102 kg 102 kg    Telemetry    Developed Afib at 21:00 on 2/5 with ventricular rates in the low 100s bpm predominantly, with brief episodes into the 120s to 140s bpm - Personally Reviewed  ECG    No new tracings - Personally Reviewed  Physical Exam   GEN: No acute distress.   Neck: No JVD. Cardiac: IRIR, II/VI systolic murmur LSB, no rubs, or gallops.  Respiratory: Clear to auscultation bilaterally.  GI: Soft, nontender, non-distended.   MS: No edema; No deformity. Neuro:  Alert and oriented x 3; Nonfocal.  Psych: Normal  affect.  Labs    Chemistry Recent Labs  Lab 06/09/23 2153 06/10/23 1531 06/11/23 0635  NA 133* 137 135  K 2.8* 3.7 3.6  CL 97* 103 106  CO2 20* 24 22  GLUCOSE 139* 130* 99  BUN 17 15 13   CREATININE 1.14* 1.12* 1.02*  CALCIUM  8.1* 7.9* 7.8*  PROT 6.8  --  5.7*  ALBUMIN 2.9*  --  2.4*  AST 53*  --  27  ALT 27  --  19  ALKPHOS 107  --  77  BILITOT 1.8*  --  1.0  GFRNONAA 51* 52* 58*  ANIONGAP 16* 10 7     Hematology Recent Labs  Lab 06/10/23 1531 06/11/23 0635 06/12/23 0723  WBC 22.2* 16.8* 12.0*  RBC 3.50* 3.27* 3.41*  HGB 9.4* 8.5* 9.1*  HCT 29.7* 27.5* 28.5*  MCV 84.9 84.1 83.6  MCH 26.9 26.0 26.7  MCHC 31.6 30.9 31.9  RDW 15.9* 16.1* 16.4*  PLT 178 160 160    Cardiac EnzymesNo results for input(s): TROPONINI in the last 168 hours. No results for input(s): TROPIPOC in the last 168 hours.   BNP Recent Labs  Lab 06/09/23 2153  BNP 720.2*     DDimer No results for input(s): DDIMER in the last 168 hours.   Radiology  Cardiac Studies   2D echo 06/10/2023: 1. Left ventricular ejection fraction, by estimation, is 50 to 55%. The  left ventricle has low normal function. The left ventricle has no regional  wall motion abnormalities. Left ventricular diastolic function could not  be evaluated.   2. Right ventricular systolic function is mildly reduced. The right  ventricular size is mildly enlarged.   3. Left atrial size was severely dilated.   4. Right atrial size was mildly dilated.   5. The mitral valve has been repaired/replaced. No evidence of mitral  valve regurgitation. Moderate to severe mitral stenosis. The mean mitral  valve gradient is 11.0 mmHg.   6. Tricuspid valve regurgitation is mild to moderate.   7. The aortic valve is tricuspid. There is mild thickening of the aortic  valve. Aortic valve regurgitation is not visualized. Aortic valve  sclerosis/calcification is present, without any evidence of aortic  stenosis.    Conclusion(s)/Recommendation(s): Mitral valve prosthesis appears thickened  with increased mean gradient since last echo shortly after implantation in  10/2021 at [facility] (mean gratient 5 -> 11 mmHg). Endocarditis and/or pannus  formation cannot be excluded,  though a mobile vegetation is not clearly seen. Consider further  evaluation by transesophageal echocardiogram.  ________  TEE 06/11/2023: 1. Left ventricular ejection fraction, by estimation, is 55 to 60%. The  left ventricle has normal function. The left ventricle has no regional  wall motion abnormalities.   2. Right ventricular systolic function is mildly reduced. The right  ventricular size is normal.   3. Left atrial size was severely dilated. No left atrial/left atrial  appendage thrombus was detected.   4. Right atrial size was mildly dilated.   5. The mitral valve has been repaired/replaced. No evidence of mitral  valve regurgitation. Mild mitral stenosis. The mean mitral valve gradient  is 7.0 mmHg. There is a bioprosthetic valve present in the mitral  position. Echo findings are consistent with   vegetation of the posterior mitral prosthesis, with significant  thickening and a small mobile opacity coming from the posterior leaflet.   6. Tricuspid valve regurgitation is moderate.   7. The aortic valve is normal in structure. There is mild calcification  of the aortic valve. Aortic valve regurgitation is not visualized. Aortic  valve sclerosis is present, with no evidence of aortic valve stenosis.   8. The inferior vena cava is normal in size with greater than 50%  respiratory variability, suggesting right atrial pressure of 3 mmHg.   9. Agitated saline contrast bubble study was negative, with no evidence  of any interatrial shunt.   Conclusion(s)/Recommendation(s): Thickening of MV posterior leflet with  mobile vegetation noted   Patient Profile     73 y.o. 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  Assessment & Plan    1.  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  PAF: -Presented in sinus rhythm with development of A-fib at 2100 on 06/11/2023, remains in A-fib with ventricular rates largely in the low 100s bpm with brief episodes into the 120s to 140s bpm -Asymptomatic -Reload with amiodarone  400 mg twice daily -Now on Toprol -XL 25 mg -Continue heparin  infusion  3.  Elevated troponin: -Suspected to be supply/demand ischemia in the setting of bacteremia -Echo this admission with preserved LV systolic function and no focal wall motion abnormality -Continue heparin , aspirin , and beta-blocker  4.  Anemia: -Hemoglobin improved to 9.1 from 8.5, though remains down from a baseline around 12 -Denies symptoms concerning for bleeding -Monitor on heparin  drip  5.  Hypokalemia: -Likely from GI loss -Improving       For  questions or updates, please contact [facility] Please consult [email or web address] for contact info under Cardiology/STEMI.    Signed, Bernardino Bring, PA-C [facility] Pager: [phone] 06/12/2023, 8:47 AM

## 2023-06-12 NOTE — Telephone Encounter (Signed)
 Pharmacy Patient Advocate Encounter  Insurance verification completed.    The patient is insured through [employer]. Patient has Medicare and is not eligible for a copay card, but may be able to apply for patient assistance or Medicare RX Payment Plan (Patient Must reach out to their plan, if eligible for payment plan), if available.    Ran test claim for JARDIANCE  10MG  and the current 30 day co-pay is $0.00.  Ran test claim for FARXIGA 10MG  and the current 30 day co-pay is $0.00.  This test claim was processed through [facility]- copay amounts may vary at other pharmacies due to pharmacy/plan contracts, or as the patient moves through the different stages of their insurance plan.

## 2023-06-12 NOTE — Progress Notes (Signed)
 PROGRESS NOTE    ROSAMUND NYLAND   FMW:[id number] DOB: January 21, 1951  DOA: 06/09/2023 Date of Service: 06/12/23 which is hospital day 2  PCP: Myrla Jon HERO, MD    Hospital course / significant events:   HPI: 73 year old female history of A-fib on Xarelto , hypertension, CKD stage IIIa, prior history of stroke, history of systolic heart failure EF of 45%, hypertension, presents to the ER with a 1-week history of nausea, vomiting and diarrhea.  Patient states that she got sick last week.  Was seen in the ER on January 26.  She had palpitations that time.  Workup was negative.  Patient discharged to home.  She followed up in the office and was diagnosed with flulike symptoms.  She had a respiratory viral panel that was negative.  She continued to have diarrhea along with nausea and vomiting.  She states that she felt some tightness in her chest and her abdomen.  She has been vomiting.  She felt some palpitations in the middle of the night.  She came to the ER 02/03 for evaluation.  02/03: to ED evening - elevated BNP, procalcitonin, troponin 283 --> 331 started on heparin , lactate 2.6 -(1L IVF)--> 1.7, started on broad spectrum abx cefepime  + vanc + flagyl .  02/04: admitted to hospitalist service w/ concern for gastroenteritis w/ concern for associated sepsis (reasonably ruled out UTI, PNA, colitis or other intraabdominal bacterial infection).  02/05: unfortunately (+)TEE for mitral valve vegetation.  Blood culture (+)gram positive cocci in anaerobic bottle. ID consulted --> await final BCx results, continue ceftriaxone  + flagyl , checking CDiff and may need colonoscopy, recs for MRI spine d/t pain/bacteremia. Pt reports diarrhea has improved a lot today. CDiff neg.      Consultants:  Cardiology   Procedures/Surgeries: TEE 06/11/23       ASSESSMENT & PLAN:   Endocarditis  Mitral valve endocarditis, bioprosthetic valve  Gram positive cocci bacteremia  Confirmed on TEE 02/05 showing  mobile opacity on posterior leaflet mitral valve prosthetic valve.  Infection likely caught relatively early given no significant valve regurgitation, grossly no abscess formation though will need to be monitored closely  Hx: June 2023 admitted to [facility] Streptococcus sanguinous bacteremia from infected tooth with mitral valve vegetation, at that time underwent mitral valve replacement with bioprosthetic valve ID following Cardiology following, expect will need serial Echo's likely outpatient  MRI spine L and T spine given back pain in both areas   Following for BCx ID/Susc  For any worsening valve damage, will need CT surgery consultation  PICC line for long term abx Contact precautions  Acute gastroenteritis - improving  Clinically, concern for norovirus vs other viral illness, CDiff engative  check stool studies --> negative / nothing detected  Enteric/contact precautions either way  Diet adv as tolerated    Sepsis with acute organ dysfunction without septic shock  On admission, she has sepsis with acute organ dysfunction without septic shock.  Evidenced by a white count of 43,000, elevated lactic acid of 2.5, a weeks worth of diarrhea.   empirically started on cefepime , vancomycin  and Flagyl .  Continue for now on cefepim/flagyl  pending ID consult  Follow BCx Follow stool panel.   Back pain MRI given concern for osteo w/ bacteremia Tylenol  scheduled for next 1-2 days Tramadol  prn mod-severe Fentanyl  IV prn breakthrough Pt educated on goal to avoid IV pain meds if at all able to  Demand ischemia  Elevated troponin Favor demand ischarmia over NSTEMI trend troponins.   IV heparin , hold  home xarelto   Cardiology consulted- Until we have a better understanding of her leukocytosis and potential infection, I would be reluctant to pursue cardiac catheterization unless Ms. Sutherland has angina with dynamic EKG changes or significant rise in troponin.  Abnormal Transthoracic  Echocardiogram Echo (TTE) while not definitive for prosthetic valve endocarditis, is concerning with apparent leaflet thickening as well as increased gradient compared to her initial postoperative echocardiogram at [facility] Follow BCx  transesophageal echocardiogram to better understand her mitral valve prosthesis and ensure there is not evidence of a vegetation or pannus formation check an ESR and CRP.    Hypokalemia D/t diarrhea Replace as needed Monitor BMP   Chronic a-fib  On admission, EKG shows normal sinus rhythm.   Xarelto  for anticoagulation at home - held while on heparin   Continue home Toprol -XL rate control with hold parameters Follow w/ cardiology - discussion about possible ablation given her chronic A-fib. continue home amiodarone .     On amiodarone  Thyroid  panel per cardiology   HFrEF (heart failure with reduced ejection fraction)  On admission, patient has actually intra vascular volume depleted.   Continue with IV fluids.   Hold her Aldactone , Cozaar  for now given soft BP    Stage 3a chronic kidney disease (HCC) - Baseline creatinine 1.1-1.4 On admission, patient has a history of CKD stage IIIa.  Baseline creatinine 1.1-1.4.  Cr on admission is close to baseline, no AKI Hold her Aldactone  and her losartan  for now.   OSA (obstructive sleep apnea) On admission continue CPAP   Essential hypertension Home blood pressure medications include Aldactone , Toprol -XL, Cozaar .   hold her Cozaar  and Aldactone  due to dehydration.   continue Toprol -XL with hold parameters    Class 2 obesity based on BMI: Body mass index is 37.42 kg/m.  Underweight - under 18  overweight - 25 to 29 obese - 30 or more Class 1 obesity: BMI of 30.0 to 34 Class 2 obesity: BMI of 35.0 to 39 Class 3 obesity: BMI of 40.0 to 49 Super Morbid Obesity: BMI 50-59 Super-super Morbid Obesity: BMI 60+ Significantly low or high BMI is associated with higher medical risk.  Weight management advised  as adjunct to other disease management and risk reduction treatments    DVT prophylaxis: heparin  IV fluids: no continuous IV fluids  Nutrition: NPO for procedure, advance as tolerated following back to floor  Central lines / invasive devices: none  Code Status: FULL CODE ACP documentation reviewed:  none on file in [facility]  TOC needs: TBD Barriers to dispo / significant pending items: IV abx, PICC line, expect will be here several days pending ID clearance and abx plan prior to discharge              Subjective / Brief ROS:  Patient reports diarrhea has improved overall but still present Reports back pain - middle and lower pain  Denies CP/SOB.  Denies new weakness. .   Family Communication: no new updates at this time     Objective Findings:  Vitals:   06/11/23 2325 06/12/23 0352 06/12/23 0819 06/12/23 1136  BP: (!) 103/58 92/65 (!) 146/58 (!) 91/48  Pulse: 74 93 (!) 108 99  Resp: 19 19 20 18   Temp: 98 F (36.7 C) 97.8 F (36.6 C) 98.1 F (36.7 C) 97.7 F (36.5 C)  TempSrc: Oral Oral Oral Oral  SpO2: 94% 96% 98% 91%  Weight:      Height:        Intake/Output Summary (Last 24 hours) at  06/12/2023 1438 Last data filed at 06/12/2023 1006 Gross per 24 hour  Intake 621.56 ml  Output 209 ml  Net 412.56 ml   Filed Weights   06/09/23 2135 06/11/23 1205  Weight: 102 kg 102 kg    Examination:  Physical Exam Constitutional:      General: She is not in acute distress. Cardiovascular:     Rate and Rhythm: Normal rate and regular rhythm.  Pulmonary:     Effort: Pulmonary effort is normal.     Breath sounds: Normal breath sounds.  Neurological:     General: No focal deficit present.     Mental Status: She is alert. Mental status is at baseline.          Scheduled Medications:   acetaminophen   650 mg Oral Q6H   amiodarone   400 mg Oral BID   aspirin  EC  81 mg Oral Daily   metoprolol  succinate  25 mg Oral Daily   sertraline   50 mg Oral Daily     Continuous Infusions:  cefTRIAXone  (ROCEPHIN )  IV 2 g (06/12/23 0039)   heparin  1,850 Units/hr (06/12/23 0921)   metronidazole  500 mg (06/12/23 0943)    PRN Medications:  cyclobenzaprine , fentaNYL  (SUBLIMAZE ) injection, ondansetron  **OR** ondansetron  (ZOFRAN ) IV, traMADol   Antimicrobials from admission:  Anti-infectives (From admission, onward)    Start     Dose/Rate Route Frequency Ordered Stop   06/11/23 2200  metroNIDAZOLE  (FLAGYL ) IVPB 500 mg        500 mg 100 mL/hr over 60 Minutes Intravenous Every 12 hours 06/11/23 1628     06/11/23 0015  cefTRIAXone  (ROCEPHIN ) 2 g in sodium chloride  0.9 % 100 mL IVPB        2 g 200 mL/hr over 30 Minutes Intravenous Every 24 hours 06/10/23 2320     06/11/23 0000  metroNIDAZOLE  (FLAGYL ) tablet 500 mg  Status:  Discontinued        500 mg Oral Every 12 hours 06/10/23 2320 06/11/23 1628   06/09/23 2245  ceFEPIme  (MAXIPIME ) 2 g in sodium chloride  0.9 % 100 mL IVPB        2 g 200 mL/hr over 30 Minutes Intravenous  Once 06/09/23 2238 06/09/23 2324   06/09/23 2245  metroNIDAZOLE  (FLAGYL ) IVPB 500 mg        500 mg 100 mL/hr over 60 Minutes Intravenous  Once 06/09/23 2238 06/10/23 0035   06/09/23 2245  vancomycin  (VANCOCIN ) IVPB 1000 mg/200 mL premix        1,000 mg 200 mL/hr over 60 Minutes Intravenous  Once 06/09/23 2238 06/10/23 0035           Data Reviewed:  I have personally reviewed the following...  CBC: Recent Labs  Lab 06/09/23 2153 06/10/23 1531 06/11/23 0635 06/12/23 0723  WBC 43.3* 22.2* 16.8* 12.0*  NEUTROABS 38.8* 20.1* 13.8*  --   HGB 10.2* 9.4* 8.5* 9.1*  HCT 31.4* 29.7* 27.5* 28.5*  MCV 82.2 84.9 84.1 83.6  PLT 186 178 160 160   Basic Metabolic Panel: Recent Labs  Lab 06/09/23 2153 06/10/23 1531 06/11/23 0635  NA 133* 137 135  K 2.8* 3.7 3.6  CL 97* 103 106  CO2 20* 24 22  GLUCOSE 139* 130* 99  BUN 17 15 13   CREATININE 1.14* 1.12* 1.02*  CALCIUM  8.1* 7.9* 7.8*  MG 1.8 2.3  --    GFR: Estimated  Creatinine Clearance: 59 mL/min (A) (by C-G formula based on SCr of 1.02 mg/dL (H)). Liver Function Tests: Recent Labs  Lab 06/09/23 2153 06/11/23 0635  AST 53* 27  ALT 27 19  ALKPHOS 107 77  BILITOT 1.8* 1.0  PROT 6.8 5.7*  ALBUMIN 2.9* 2.4*   Recent Labs  Lab 06/09/23 2153  LIPASE 22   No results for input(s): AMMONIA in the last 168 hours. Coagulation Profile: Recent Labs  Lab 06/10/23 0359  INR 1.6*   Cardiac Enzymes: No results for input(s): CKTOTAL, CKMB, CKMBINDEX, TROPONINI in the last 168 hours. BNP (last 3 results) No results for input(s): PROBNP in the last 8760 hours. HbA1C: No results for input(s): HGBA1C in the last 72 hours. CBG: No results for input(s): GLUCAP in the last 168 hours. Lipid Profile: No results for input(s): CHOL, HDL, LDLCALC, TRIG, CHOLHDL, LDLDIRECT in the last 72 hours. Thyroid  Function Tests: Recent Labs    06/10/23 1553 06/10/23 2253  TSH 0.890 1.423  FREET4 2.01*  --    Anemia Panel: No results for input(s): VITAMINB12, FOLATE, FERRITIN, TIBC, IRON, RETICCTPCT in the last 72 hours. Most Recent Urinalysis On File:     Component Value Date/Time   COLORURINE YELLOW (A) 06/09/2023 2153   APPEARANCEUR CLEAR (A) 06/09/2023 2153   LABSPEC 1.028 06/09/2023 2153   PHURINE 6.0 06/09/2023 2153   GLUCOSEU NEGATIVE 06/09/2023 2153   HGBUR SMALL (A) 06/09/2023 2153   BILIRUBINUR NEGATIVE 06/09/2023 2153   KETONESUR NEGATIVE 06/09/2023 2153   PROTEINUR NEGATIVE 06/09/2023 2153   NITRITE NEGATIVE 06/09/2023 2153   LEUKOCYTESUR NEGATIVE 06/09/2023 2153   Sepsis Labs: @LABRCNTIP (procalcitonin:4,lacticidven:4) Microbiology: Recent Results (from the past 240 hours)  Respiratory virus panel     Status: None   Collection Time: 06/03/23  3:57 PM   Specimen: Nasal Swab; Respiratory  Result Value Ref Range Status   Adenovirus B Not Detected Not Detected Final   Rhinovirus Not Detected Not Detected  Final   Influenza A Not Detected Not Detected Final   INFLUENZA A SUBTYPE H1 Not Detected Not Detected Final   INFLUENZA A SUBTYPE H3 Not Detected Not Detected Final   Influenza B Not Detected Not Detected Final   Metapneumovirus Not Detected Not Detected Final   Respiratory Syncytial Virus A Not Detected Not Detected Final   Respiratory Syncytial Virus B Not Detected Not Detected Final   HUMAN PARAINFLU VIRUS 1 Not Detected Not Detected Final   HUMAN PARAINFLU VIRUS 2 Not Detected Not Detected Final   HUMAN PARAINFLU VIRUS 3 Not Detected Not Detected Final   Comment see note  Final    Comment: THIS ASSAY WILL NOT DETECT SARS-CoV-2 (COVID-19) . This test is performed using the [employer]. . . A positive Influenza A associated with negative H1 or H3 subtyping results do not preclude influenza A virus H1/ H3 infections. Low level positive Influenza A may result in unsubtypeable seasonal Influenza. Samples can be submitted for further testing at state public health laboratories if available. . The manufacturer of a reagent used for this assay has informed laboratories that human metapneumovirus positive results obtained performing this test, especially when detected in a co-infection, should be confirmed with an alternative molecular human metapneumovirus assay, if medically indicated. The results of this test should not be used as the sole basis for diagnosis, treatment, or other patient management decisions. For inquiries, please contact [phone] ([phone]). . A positive Influenza A associated with negative H1 or H3 subtypin g results do not preclude influenza A virus H1/H3 infections. Low level positive Influenza A may result in unsubtypeable seasonal Influenza. Samples can be submitted  for further testing at state public health laboratories if available. . Limitations: A negative result does not rule out respiratory pathogen infection below the  sensitivity limit of the assay. The sensitivity depends on pathogen and sample type. . This assay cannot reliably distinguish between Rhinovirus and Enterovirus due to genetic similarities between the viruses. .   Gastrointestinal Panel by PCR , Stool     Status: None   Collection Time: 06/09/23 12:42 PM   Specimen: Stool  Result Value Ref Range Status   Campylobacter species NOT DETECTED NOT DETECTED Final   Plesimonas shigelloides NOT DETECTED NOT DETECTED Final   Salmonella species NOT DETECTED NOT DETECTED Final   Yersinia enterocolitica NOT DETECTED NOT DETECTED Final   Vibrio species NOT DETECTED NOT DETECTED Final   Vibrio cholerae NOT DETECTED NOT DETECTED Final   Enteroaggregative E coli (EAEC) NOT DETECTED NOT DETECTED Final   Enteropathogenic E coli (EPEC) NOT DETECTED NOT DETECTED Final   Enterotoxigenic E coli (ETEC) NOT DETECTED NOT DETECTED Final   Shiga like toxin producing E coli (STEC) NOT DETECTED NOT DETECTED Final   Shigella/Enteroinvasive E coli (EIEC) NOT DETECTED NOT DETECTED Final   Cryptosporidium NOT DETECTED NOT DETECTED Final   Cyclospora cayetanensis NOT DETECTED NOT DETECTED Final   Entamoeba histolytica NOT DETECTED NOT DETECTED Final   Giardia lamblia NOT DETECTED NOT DETECTED Final   Adenovirus F40/41 NOT DETECTED NOT DETECTED Final   Astrovirus NOT DETECTED NOT DETECTED Final   Norovirus GI/GII NOT DETECTED NOT DETECTED Final   Rotavirus A NOT DETECTED NOT DETECTED Final   Sapovirus (I, II, IV, and V) NOT DETECTED NOT DETECTED Final    Comment: Performed at [facility], [address] [place], [place] [address]  Resp panel by RT-PCR (RSV, Flu A&B, Covid) Anterior Nasal Swab     Status: None   Collection Time: 06/09/23  9:53 PM   Specimen: Anterior Nasal Swab  Result Value Ref Range Status   SARS Coronavirus 2 by RT PCR NEGATIVE NEGATIVE Final    Comment: (NOTE) SARS-CoV-2 target nucleic acids are NOT DETECTED.  The SARS-CoV-2  RNA is generally detectable in upper respiratory specimens during the acute phase of infection. The lowest concentration of SARS-CoV-2 viral copies this assay can detect is 138 copies/mL. A negative result does not preclude SARS-Cov-2 infection and should not be used as the sole basis for treatment or other patient management decisions. A negative result may occur with  improper specimen collection/handling, submission of specimen other than nasopharyngeal swab, presence of viral mutation(s) within the areas targeted by this assay, and inadequate number of viral copies(<138 copies/mL). A negative result must be combined with clinical observations, patient history, and epidemiological information. The expected result is Negative.  Fact Sheet for Patients:  bloggercourse.com  Fact Sheet for Healthcare Providers:  seriousbroker.it  This test is no t yet approved or cleared by the [place]  FDA and  has been authorized for detection and/or diagnosis of SARS-CoV-2 by FDA under an Emergency Use Authorization (EUA). This EUA will remain  in effect (meaning this test can be used) for the duration of the COVID-19 declaration under Section 564(b)(1) of the Act, 21 U.S.C.section 360bbb-3(b)(1), unless the authorization is terminated  or revoked sooner.       Influenza A by PCR NEGATIVE NEGATIVE Final   Influenza B by PCR NEGATIVE NEGATIVE Final    Comment: (NOTE) The Xpert Xpress SARS-CoV-2/FLU/RSV plus assay is intended as an aid in the diagnosis of influenza from  Nasopharyngeal swab specimens and should not be used as a sole basis for treatment. Nasal washings and aspirates are unacceptable for Xpert Xpress SARS-CoV-2/FLU/RSV testing.  Fact Sheet for Patients: bloggercourse.com  Fact Sheet for Healthcare Providers: seriousbroker.it  This test is not yet approved or cleared by the  [place]  FDA and has been authorized for detection and/or diagnosis of SARS-CoV-2 by FDA under an Emergency Use Authorization (EUA). This EUA will remain in effect (meaning this test can be used) for the duration of the COVID-19 declaration under Section 564(b)(1) of the Act, 21 U.S.C. section 360bbb-3(b)(1), unless the authorization is terminated or revoked.     Resp Syncytial Virus by PCR NEGATIVE NEGATIVE Final    Comment: (NOTE) Fact Sheet for Patients: bloggercourse.com  Fact Sheet for Healthcare Providers: seriousbroker.it  This test is not yet approved or cleared by the [place]  FDA and has been authorized for detection and/or diagnosis of SARS-CoV-2 by FDA under an Emergency Use Authorization (EUA). This EUA will remain in effect (meaning this test can be used) for the duration of the COVID-19 declaration under Section 564(b)(1) of the Act, 21 U.S.C. section 360bbb-3(b)(1), unless the authorization is terminated or revoked.  Performed at [facility], [address] [place], [place] [address]   Culture, blood (single)     Status: None (Preliminary result)   Collection Time: 06/09/23 10:59 PM   Specimen: BLOOD LEFT ARM  Result Value Ref Range Status   Specimen Description   Final    BLOOD LEFT ARM Performed at [facility], [address] [place], [place] [address]    Special Requests   Final    BOTTLES DRAWN AEROBIC AND ANAEROBIC Blood Culture results may not be optimal due to an inadequate volume of blood received in culture bottles Performed at [facility], [address] [place], [place] [address]    Culture  Setup Time   Final    GRAM POSITIVE COCCI IN BOTH AEROBIC AND ANAEROBIC BOTTLES CRITICAL RESULT CALLED TO, READ BACK BY AND VERIFIED WITH: SHEEMA HALLAJI PHARMD [id number] 06/11/23 HNM GRAM STAIN REVIEWED-AGREE WITH RESULT DRT    Culture   Final    GRAM POSITIVE COCCI TOO  YOUNG TO READ Performed at [facility], [address] [place], [place] [address]    Report Status PENDING  Incomplete  Blood Culture ID Panel (Reflexed)     Status: None   Collection Time: 06/09/23 10:59 PM  Result Value Ref Range Status   Enterococcus faecalis NOT DETECTED NOT DETECTED Final   Enterococcus Faecium NOT DETECTED NOT DETECTED Final   Listeria monocytogenes NOT DETECTED NOT DETECTED Final   Staphylococcus species NOT DETECTED NOT DETECTED Final   Staphylococcus aureus (BCID) NOT DETECTED NOT DETECTED Final   Staphylococcus epidermidis NOT DETECTED NOT DETECTED Final   Staphylococcus lugdunensis NOT DETECTED NOT DETECTED Final   Streptococcus species NOT DETECTED NOT DETECTED Final   Streptococcus agalactiae NOT DETECTED NOT DETECTED Final   Streptococcus pneumoniae NOT DETECTED NOT DETECTED Final   Streptococcus pyogenes NOT DETECTED NOT DETECTED Final   A.calcoaceticus-baumannii NOT DETECTED NOT DETECTED Final   Bacteroides fragilis NOT DETECTED NOT DETECTED Final   Enterobacterales NOT DETECTED NOT DETECTED Final   Enterobacter cloacae complex NOT DETECTED NOT DETECTED Final   Escherichia coli NOT DETECTED NOT DETECTED Final   Klebsiella aerogenes NOT DETECTED NOT DETECTED Final   Klebsiella oxytoca NOT DETECTED NOT DETECTED Final   Klebsiella pneumoniae NOT DETECTED NOT DETECTED Final  Proteus species NOT DETECTED NOT DETECTED Final   Salmonella species NOT DETECTED NOT DETECTED Final   Serratia marcescens NOT DETECTED NOT DETECTED Final   Haemophilus influenzae NOT DETECTED NOT DETECTED Final   Neisseria meningitidis NOT DETECTED NOT DETECTED Final   Pseudomonas aeruginosa NOT DETECTED NOT DETECTED Final   Stenotrophomonas maltophilia NOT DETECTED NOT DETECTED Final   Candida albicans NOT DETECTED NOT DETECTED Final   Candida auris NOT DETECTED NOT DETECTED Final   Candida glabrata NOT DETECTED NOT DETECTED Final   Candida krusei NOT DETECTED NOT  DETECTED Final   Candida parapsilosis NOT DETECTED NOT DETECTED Final   Candida tropicalis NOT DETECTED NOT DETECTED Final   Cryptococcus neoformans/gattii NOT DETECTED NOT DETECTED Final    Comment: Performed at [facility], [address] [place], [place] [address]  Blood culture (single)     Status: None (Preliminary result)   Collection Time: 06/10/23 12:53 AM   Specimen: BLOOD  Result Value Ref Range Status   Specimen Description BLOOD RIGHT ARM  Final   Special Requests   Final    BOTTLES DRAWN AEROBIC AND ANAEROBIC Blood Culture results may not be optimal due to an inadequate volume of blood received in culture bottles   Culture   Final    NO GROWTH 2 DAYS Performed at [facility], [address] [place], [place] [address]    Report Status PENDING  Incomplete  C Difficile Quick Screen w PCR reflex     Status: None   Collection Time: 06/11/23  9:28 PM   Specimen: STOOL  Result Value Ref Range Status   C Diff antigen NEGATIVE NEGATIVE Final   C Diff toxin NEGATIVE NEGATIVE Final   C Diff interpretation No C. difficile detected.  Final    Comment: Performed at [facility], [address] [place], [place] [address]      Radiology Studies last 3 days: ECHO TEE Result Date: 06/11/2023    TRANSESOPHOGEAL ECHO REPORT   Patient Name:   KEMIAH BOOZ Date of Exam: 06/11/2023 Medical Rec #:  [id number]       Height:       65.0 in Accession #:    [id number]      Weight:       224.9 lb Date of Birth:  1951/02/20       BSA:          2.079 m Patient Age:    72 years        BP:           95/59 mmHg Patient Gender: F               HR:           67 bpm. 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 Risk  Factors:Hypertension. S/P Bovine mitral                  valve replacement.                   Mitral Valve: bioprosthetic valve valve is present  in the                  mitral position.  Sonographer:     Christopher Furnace Referring Phys:  [id number] CHRISTOPHER RONALD BERGE Diagnosing Phys: Evalene Lunger MD PROCEDURE: After discussion of the risks and benefits of a TEE, an informed consent was obtained from the patient. TEE procedure time was 30 minutes. The transesophogeal probe was passed without difficulty through the esophogus of the patient. Local oropharyngeal anesthetic was provided with Cetacaine  and viscous lidocaine . Sedation performed by different physician. Image quality was excellent. The patient's vital signs; including heart rate, blood pressure, and oxygen saturation; remained stable throughout the procedure. The patient developed no complications during the procedure.  IMPRESSIONS  1. Left ventricular ejection fraction, by estimation, is 55 to 60%. The left ventricle has normal function. The left ventricle has no regional wall motion abnormalities.  2. Right ventricular systolic function is mildly reduced. The right ventricular size is normal.  3. Left atrial size was severely dilated. No left atrial/left atrial appendage thrombus was detected.  4. Right atrial size was mildly dilated.  5. The mitral valve has been repaired/replaced. No evidence of mitral valve regurgitation. Mild mitral stenosis. The mean mitral valve gradient is 7.0 mmHg. There is a bioprosthetic valve present in the mitral position. Echo findings are consistent with  vegetation of the posterior mitral prosthesis, with significant thickening and a small mobile opacity coming from the posterior leaflet.  6. Tricuspid valve regurgitation is moderate.  7. The aortic valve is normal in structure. There is mild calcification of the aortic valve. Aortic valve regurgitation is not visualized. Aortic valve sclerosis is present, with no evidence of  aortic valve stenosis.  8. The inferior vena cava is normal in size with greater than 50% respiratory variability, suggesting right atrial pressure of 3 mmHg.  9. Agitated saline contrast bubble study was negative, with no evidence of any interatrial shunt. Conclusion(s)/Recommendation(s): Thickening of MV posterior leflet with mobile vegetation noted FINDINGS  Left Ventricle: Left ventricular ejection fraction, by estimation, is 55 to 60%. The left ventricle has normal function. The left ventricle has no regional wall motion abnormalities. The left ventricular internal cavity size was normal in size. There is  no left ventricular hypertrophy. Right Ventricle: The right ventricular size is normal. No increase in right ventricular wall thickness. Right ventricular systolic function is mildly reduced. Left Atrium: Left atrial size was severely dilated. No left atrial/left atrial appendage thrombus was detected. Right Atrium: Right atrial size was mildly dilated. Pericardium: There is no evidence of pericardial effusion. Mitral Valve: The mitral valve has been repaired/replaced. There is severe thickening of the mitral valve leaflet(s). No evidence of mitral valve regurgitation. There is a bioprosthetic valve present in the mitral position. Echo findings are consistent with vegetation on the mitral prosthesis. Mild mitral valve stenosis. MV peak gradient, 22.3 mmHg. The mean mitral valve gradient is 7.0 mmHg. Tricuspid Valve: The tricuspid valve is normal in structure. Tricuspid valve regurgitation is moderate . No evidence of tricuspid stenosis. Aortic Valve: The aortic valve is normal in structure. There is mild calcification of the aortic valve. Aortic valve regurgitation is not visualized. Aortic valve sclerosis is present, with no evidence of aortic valve stenosis. Pulmonic Valve: The pulmonic valve was normal in structure. Pulmonic valve regurgitation is not visualized. No evidence of pulmonic stenosis. Aorta:  The aortic root is normal in size and structure. Venous: The inferior vena cava is normal in size with greater  than 50% respiratory variability, suggesting right atrial pressure of 3 mmHg. IAS/Shunts: No atrial level shunt detected by color flow Doppler. Agitated saline contrast was given intravenously to evaluate for intracardiac shunting. Agitated saline contrast bubble study was negative, with no evidence of any interatrial shunt. There  is no evidence of a patent foramen ovale. There is no evidence of an atrial septal defect.  MITRAL VALVE MV Area (PHT): 1.46 cm MV Peak grad:  22.3 mmHg MV Mean grad:  7.0 mmHg MV Vmax:       2.36 m/s MV Vmean:      107.0 cm/s MV Decel Time: 518 msec MV E velocity: 198.00 cm/s MV A velocity: 41.20 cm/s MV E/A ratio:  4.81 Evalene Lunger MD Electronically signed by Evalene Lunger MD Signature Date/Time: 06/11/2023/4:44:40 PM    Final    ECHOCARDIOGRAM COMPLETE Result Date: 06/10/2023    ECHOCARDIOGRAM REPORT   Patient Name:   LYNZEE LINDQUIST Date of Exam: 06/10/2023 Medical Rec #:  [id number]       Height:       65.0 in Accession #:    [id number]      Weight:       224.9 lb Date of Birth:  [birth year]       BSA:          2.079 m Patient Age:    72 years        BP:           97/55 mmHg Patient Gender: F               HR:           72 bpm. Exam Location:  [facility] Procedure: 2D Echo, Cardiac Doppler and Color Doppler Indications:     Chest pain R07.9  History:         Patient has prior history of Echocardiogram examinations, most                  recent 07/24/2022. Risk Factors:Hypertension and Dyslipidemia.                  S/P Bovine mitral valve replacement.  Sonographer:     Christopher Furnace Referring Phys:  [id number] CHRISTOPHER END Diagnosing Phys: Lonni Hanson MD IMPRESSIONS  1. Left ventricular ejection fraction, by estimation, is 50 to 55%. The left ventricle has low normal function. The left ventricle has no regional wall motion abnormalities. Left ventricular diastolic function could not  be evaluated.  2. Right ventricular systolic function is mildly reduced. The right ventricular size is mildly enlarged.  3. Left atrial size was severely dilated.  4. Right atrial size was mildly dilated.  5. The mitral valve has been repaired/replaced. No evidence of mitral valve regurgitation. Moderate to severe mitral stenosis. The mean mitral valve gradient is 11.0 mmHg.  6. Tricuspid valve regurgitation is mild to moderate.  7. The aortic valve is tricuspid. There is mild thickening of the aortic valve. Aortic valve regurgitation is not visualized. Aortic valve sclerosis/calcification is present, without any evidence of aortic stenosis. Conclusion(s)/Recommendation(s): Mitral valve prosthesis appears thickened with increased mean gradient since last echo shortly after implantation in 10/2021 at [facility] (mean gratient 5 -> 11 mmHg). Endocarditis and/or pannus formation cannot be excluded, though a mobile vegetation is not clearly seen. Consider further evaluation by transesophageal echocardiogram. FINDINGS  Left Ventricle: Left ventricular ejection fraction, by estimation, is 50 to 55%. The left ventricle has low normal function. The left ventricle has no  regional wall motion abnormalities. The left ventricular internal cavity size was normal in size. There is borderline left ventricular hypertrophy. Left ventricular diastolic function could not be evaluated due to mitral valve replacement. Left ventricular diastolic function could not be evaluated. Right Ventricle: The right ventricular size is mildly enlarged. No increase in right ventricular wall thickness. Right ventricular systolic function is mildly reduced. Left Atrium: Left atrial size was severely dilated. Right Atrium: Right atrial size was mildly dilated. Pericardium: There is no evidence of pericardial effusion. Mitral Valve: The mitral valve has been repaired/replaced. No evidence of mitral valve regurgitation. There is a 27 mm Mitris bioprosthetic valve  present in the mitral position. Moderate to severe mitral valve stenosis. MV peak gradient, 25.8 mmHg. The mean mitral valve gradient is 11.0 mmHg. Tricuspid Valve: The tricuspid valve is normal in structure. Tricuspid valve regurgitation is mild to moderate. Aortic Valve: The aortic valve is tricuspid. There is mild thickening of the aortic valve. There is mild aortic valve annular calcification. Aortic valve regurgitation is not visualized. Aortic valve sclerosis/calcification is present, without any evidence of aortic stenosis. Aortic valve mean gradient measures 5.0 mmHg. Aortic valve peak gradient measures 10.1 mmHg. Aortic valve area, by VTI measures 1.53 cm. Pulmonic Valve: The pulmonic valve was normal in structure. Pulmonic valve regurgitation is mild to moderate. No evidence of pulmonic stenosis. Aorta: The aortic root is normal in size and structure. Pulmonary Artery: The pulmonary artery is of normal size. IAS/Shunts: No atrial level shunt detected by color flow Doppler.  LEFT VENTRICLE PLAX 2D LVIDd:         5.00 cm LVIDs:         3.20 cm LV PW:         1.10 cm LV IVS:        1.00 cm LVOT diam:     2.00 cm LV SV:         45 LV SV Index:   22 LVOT Area:     3.14 cm  RIGHT VENTRICLE RV Basal diam:  4.30 cm RV Mid diam:    3.60 cm RV S prime:     11.00 cm/s TAPSE (M-mode): 2.0 cm LEFT ATRIUM              Index        RIGHT ATRIUM           Index LA diam:        4.60 cm  2.21 cm/m   RA Area:     23.30 cm LA Vol (A2C):   107.0 ml 51.46 ml/m  RA Volume:   73.70 ml  35.45 ml/m LA Vol (A4C):   119.0 ml 57.23 ml/m LA Biplane Vol: 114.0 ml 54.83 ml/m  AORTIC VALVE                     PULMONIC VALVE AV Area (Vmax):    1.55 cm      PR End Diast Vel: 11.16 msec AV Area (Vmean):   1.62 cm AV Area (VTI):     1.53 cm AV Vmax:           158.67 cm/s AV Vmean:          103.633 cm/s AV VTI:            0.293 m AV Peak Grad:      10.1 mmHg AV Mean Grad:      5.0 mmHg LVOT Vmax:  78.20 cm/s LVOT Vmean:         53.300 cm/s LVOT VTI:          0.143 m LVOT/AV VTI ratio: 0.49  AORTA Ao Root diam: 3.00 cm MITRAL VALVE                TRICUSPID VALVE MV Area (PHT): 1.26 cm     TR Peak grad:   35.5 mmHg MV Area VTI:   0.52 cm     TR Vmax:        298.00 cm/s MV Peak grad:  25.8 mmHg MV Mean grad:  11.0 mmHg    SHUNTS MV Vmax:       2.54 m/s     Systemic VTI:  0.14 m MV Vmean:      152.0 cm/s   Systemic Diam: 2.00 cm MV Decel Time: 604 msec MV E velocity: 188.00 cm/s Lonni Hanson MD Electronically signed by Lonni Hanson MD Signature Date/Time: 06/10/2023/10:36:59 AM    Final    US  ABDOMEN LIMITED RUQ (LIVER/GB) Result Date: 06/10/2023 CLINICAL DATA:  [id number]. Gallstones on CT today. Patient undergoing workup for sepsis. EXAM: ULTRASOUND ABDOMEN LIMITED RIGHT UPPER QUADRANT COMPARISON:  CT earlier today. FINDINGS: Gallbladder: There is slight gallbladder dilatation. Multiple layering subcentimeter stones are noted in the proximal lumen intermixed with sludge. There is no free wall thickening or positive sonographic Murphy's sign, no pericholecystic fluid. Common bile duct: Diameter: 1.4 mm with no intrahepatic bile duct dilatation. Liver: No focal lesion identified. There is mild increased hepatic echogenicity consistent with steatosis. Portal vein is patent on color Doppler imaging with normal direction of blood flow towards the liver. Other: None. IMPRESSION: 1. Cholelithiasis and gallbladder sludge with slight gallbladder dilatation. No free wall thickening or positive sonographic Murphy's sign. 2. No biliary ductal dilatation. 3. Mild hepatic steatosis. Electronically Signed   By: Francis Quam M.D.   On: 06/10/2023 03:02   CT CHEST ABDOMEN PELVIS W CONTRAST Result Date: 06/10/2023 CLINICAL DATA:  Sepsis EXAM: CT CHEST, ABDOMEN, AND PELVIS WITH CONTRAST TECHNIQUE: Multidetector CT imaging of the chest, abdomen and pelvis was performed following the standard protocol during bolus administration of intravenous contrast.  RADIATION DOSE REDUCTION: This exam was performed according to the departmental dose-optimization program which includes automated exposure control, adjustment of the mA and/or kV according to patient size and/or use of iterative reconstruction technique. CONTRAST:  100mL OMNIPAQUE  IOHEXOL  300 MG/ML  SOLN COMPARISON:  None Available. FINDINGS: CT CHEST FINDINGS Cardiovascular: Mild cardiomegaly. Aortic valve replacement. Mild calcific aortic atherosclerosis. No pericardial effusion. Mediastinum/Nodes: No enlarged mediastinal, hilar, or axillary lymph nodes. Thyroid  gland, trachea, and esophagus demonstrate no significant findings. Lungs/Pleura: Mild left basilar atelectasis. Otherwise unremarkable. Musculoskeletal: No chest wall mass or suspicious bone lesions identified. CT ABDOMEN PELVIS FINDINGS Hepatobiliary: Normal hepatic contours. No intra- or extrahepatic biliary dilatation. There is cholelithiasis without acute inflammation. Pancreas: Normal pancreas. No ductal dilatation or peripancreatic fluid collection. Spleen: Normal. Adrenals/Urinary Tract: The adrenal glands are normal. No hydronephrosis, nephroureterolithiasis or solid renal mass. The urinary bladder is normal for degree of distention Stomach/Bowel: There is no hiatal hernia. Normal duodenal course and caliber. No small bowel dilatation or inflammation. Rectosigmoid diverticulosis without acute inflammation. Normal appendix. Vascular/Lymphatic: There is calcific atherosclerosis of the abdominal aorta. No lymphadenopathy. Reproductive: Normal prostate size with symmetric seminal vesicles. Other: None. Musculoskeletal: No bony spinal canal stenosis or focal osseous abnormality. IMPRESSION: 1. No acute abnormality of the chest, abdomen or pelvis. 2. Cholelithiasis without acute inflammation  3. Rectosigmoid diverticulosis without acute inflammation. Electronically Signed   By: Franky Stanford M.D.   On: 06/10/2023 00:54        Shevonne Wolf,  DO [facility] 06/12/2023, 2:38 PM    Dictation software may have been used to generate the above note. Typos may occur and escape review in typed/dictated notes. Please contact Dr Marsa directly for clarity if needed.  Staff may message me via secure chat in Epic  but this may not receive an immediate response,  please page me for urgent matters!  If 7PM-7AM, please contact night coverage [email or web address]

## 2023-06-12 NOTE — Anesthesia Postprocedure Evaluation (Signed)
 Anesthesia Post Note  Patient: Tina Peters  Procedure(s) Performed: TRANSESOPHAGEAL ECHOCARDIOGRAM (TEE)  Patient location during evaluation: Specials Recovery Anesthesia Type: General Level of consciousness: awake and alert Pain management: pain level controlled Vital Signs Assessment: post-procedure vital signs reviewed and stable Respiratory status: spontaneous breathing, nonlabored ventilation, respiratory function stable and patient connected to nasal cannula oxygen Cardiovascular status: blood pressure returned to baseline and stable Postop Assessment: no apparent nausea or vomiting Anesthetic complications: no   No notable events documented.   Last Vitals:  Vitals:   06/11/23 2325 06/12/23 0352  BP: (!) 103/58 92/65  Pulse: 74 93  Resp: 19 19  Temp: 36.7 C 36.6 C  SpO2: 94% 96%    Last Pain:  Vitals:   06/12/23 0352  TempSrc: Oral  PainSc:                  [name]

## 2023-06-12 NOTE — Progress Notes (Signed)
 Date of Admission:  06/09/2023      ID: Tina Peters is a 73 y.o. female Principal Problem:   Acute gastroenteritis Active Problems:   Essential hypertension   Hypokalemia   PAF (paroxysmal atrial fibrillation) (HCC)   OSA (obstructive sleep apnea)   Chronic anticoagulation   Stage 3a chronic kidney disease (HCC) - Baseline creatinine 1.1-1.4   Acute bacterial endocarditis   HFrEF (heart failure with reduced ejection fraction) (HCC)   Chronic a-fib (HCC)   Bacteremia   Demand ischemia (HCC)   Acute sepsis (HCC)   Obesity, Class II, BMI 35-39.9   Nausea vomiting and diarrhea   S/P MVR (mitral valve repair)   Pt is in MRI  Medications:   acetaminophen   650 mg Oral Q6H   amiodarone   400 mg Oral BID   aspirin  EC  81 mg Oral Daily   metoprolol  succinate  25 mg Oral Daily   sertraline   50 mg Oral Daily    Objective: Vital signs in last 24 hours: Patient Vitals for the past 24 hrs:  BP Temp Temp src Pulse Resp SpO2  06/12/23 1136 (!) 91/48 97.7 F (36.5 C) Oral 99 18 91 %  06/12/23 0819 (!) 146/58 98.1 F (36.7 C) Oral (!) 108 20 98 %  06/12/23 0352 92/65 97.8 F (36.6 C) Oral 93 19 96 %  06/11/23 2325 (!) 103/58 98 F (36.7 C) Oral 74 19 94 %  06/11/23 2059 (!) 123/47 98 F (36.7 C) Oral 100 19 97 %  06/11/23 1800 (!) 136/55 -- -- 64 -- --  06/11/23 1503 (!) 104/35 98.3 F (36.8 C) -- (!) 59 18 97 %    In MRI  Lab Results    Latest Ref Rng & Units 06/12/2023    7:23 AM 06/11/2023    6:35 AM 06/10/2023    3:31 PM  CBC  WBC 4.0 - 10.5 K/uL 12.0  16.8  22.2   Hemoglobin 12.0 - 15.0 g/dL 9.1  8.5  9.4   Hematocrit 36.0 - 46.0 % 28.5  27.5  29.7   Platelets 150 - 400 K/uL 160  160  178        Latest Ref Rng & Units 06/11/2023    6:35 AM 06/10/2023    3:31 PM 06/09/2023    9:53 PM  CMP  Glucose 70 - 99 mg/dL 99  869  860   BUN 8 - 23 mg/dL 13  15  17    Creatinine 0.44 - 1.00 mg/dL 8.97  8.87  8.85   Sodium 135 - 145 mmol/L 135  137  133   Potassium 3.5 - 5.1  mmol/L 3.6  3.7  2.8   Chloride 98 - 111 mmol/L 106  103  97   CO2 22 - 32 mmol/L 22  24  20    Calcium  8.9 - 10.3 mg/dL 7.8  7.9  8.1   Total Protein 6.5 - 8.1 g/dL 5.7   6.8   Total Bilirubin 0.0 - 1.2 mg/dL 1.0   1.8   Alkaline Phos 38 - 126 U/L 77   107   AST 15 - 41 U/L 27   53   ALT 0 - 44 U/L 19   27       Microbiology: Gram positive cocci bacteremia  Studies/Results: ECHO TEE Result Date: 06/11/2023    TRANSESOPHOGEAL ECHO REPORT   Patient Name:   Tina Peters Date of Exam: 06/11/2023 Medical Rec #:  [id number]  Height:       65.0 in Accession #:    [id number]      Weight:       224.9 lb Date of Birth:  Aug 12, 1950       BSA:          2.079 m Patient Age:    72 years        BP:           95/59 mmHg Patient Gender: F               HR:           67 bpm. 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 Risk Factors:Hypertension. S/P Bovine mitral                  valve replacement.                   Mitral Valve: bioprosthetic valve valve is present in the                  mitral position.  Sonographer:     Christopher Furnace Referring Phys:  [id number] CHRISTOPHER RONALD BERGE Diagnosing Phys: Evalene Lunger MD PROCEDURE: After discussion of the risks and benefits of a TEE, an informed consent was obtained from the patient. TEE procedure time was 30 minutes. The transesophogeal probe was passed without difficulty through the esophogus of the patient. Local oropharyngeal anesthetic was provided with Cetacaine  and viscous lidocaine . Sedation performed by different physician. Image quality was excellent. The patient's vital signs; including heart rate, blood pressure, and oxygen saturation; remained stable throughout the procedure. The patient  developed no complications during the procedure.  IMPRESSIONS  1. Left ventricular ejection fraction, by estimation, is 55 to 60%. The left ventricle has normal function. The left ventricle has no regional wall motion abnormalities.  2. Right ventricular systolic function is mildly reduced. The right ventricular size is normal.  3. Left atrial size was severely dilated. No left atrial/left atrial appendage thrombus was detected.  4. Right atrial size was mildly dilated.  5. The mitral valve has been repaired/replaced. No evidence of mitral valve regurgitation. Mild mitral stenosis. The mean mitral valve gradient is 7.0 mmHg. There is a bioprosthetic valve present in the mitral position. Echo findings are consistent with  vegetation of the posterior mitral prosthesis, with significant thickening and a small mobile opacity coming from the posterior leaflet.  6. Tricuspid valve regurgitation is moderate.  7. The aortic valve is normal in structure. There is mild calcification of the aortic valve. Aortic valve regurgitation is not visualized. Aortic valve sclerosis is present, with no evidence of aortic valve stenosis.  8. The inferior vena cava is normal in size with greater than 50% respiratory variability, suggesting right atrial pressure of 3 mmHg.  9. Agitated saline contrast bubble study was negative, with no evidence of any interatrial shunt. Conclusion(s)/Recommendation(s): Thickening of MV posterior leflet with mobile vegetation noted FINDINGS  Left Ventricle: Left ventricular ejection fraction, by estimation, is 55 to 60%. The left ventricle has normal function. The left ventricle  has no regional wall motion abnormalities. The left ventricular internal cavity size was normal in size. There is  no left ventricular hypertrophy. Right Ventricle: The right ventricular size is normal. No increase in right ventricular wall thickness. Right ventricular systolic function is mildly reduced. Left Atrium: Left atrial  size was severely dilated. No left atrial/left atrial appendage thrombus was detected. Right Atrium: Right atrial size was mildly dilated. Pericardium: There is no evidence of pericardial effusion. Mitral Valve: The mitral valve has been repaired/replaced. There is severe thickening of the mitral valve leaflet(s). No evidence of mitral valve regurgitation. There is a bioprosthetic valve present in the mitral position. Echo findings are consistent with vegetation on the mitral prosthesis. Mild mitral valve stenosis. MV peak gradient, 22.3 mmHg. The mean mitral valve gradient is 7.0 mmHg. Tricuspid Valve: The tricuspid valve is normal in structure. Tricuspid valve regurgitation is moderate . No evidence of tricuspid stenosis. Aortic Valve: The aortic valve is normal in structure. There is mild calcification of the aortic valve. Aortic valve regurgitation is not visualized. Aortic valve sclerosis is present, with no evidence of aortic valve stenosis. Pulmonic Valve: The pulmonic valve was normal in structure. Pulmonic valve regurgitation is not visualized. No evidence of pulmonic stenosis. Aorta: The aortic root is normal in size and structure. Venous: The inferior vena cava is normal in size with greater than 50% respiratory variability, suggesting right atrial pressure of 3 mmHg. IAS/Shunts: No atrial level shunt detected by color flow Doppler. Agitated saline contrast was given intravenously to evaluate for intracardiac shunting. Agitated saline contrast bubble study was negative, with no evidence of any interatrial shunt. There  is no evidence of a patent foramen ovale. There is no evidence of an atrial septal defect.  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 Check cdiff Could this be secondary to the above infection She may need colonoscopy   Will need MRI thoracic spine because of pain and bacteremia   AKI-iimproving   Anemia   Leucocytosis- improving   H/o mitral valve endocarditis H/o streptococcus bacteremia   H/o left leg acute ischemia, compartment syndrome in 2023

## 2023-06-12 NOTE — Progress Notes (Signed)
 Heart Failure Stewardship Pharmacy Note  PCP: Myrla Jon HERO, MD PCP-Cardiologist: Deatrice Cage, MD  HPI: Tina Peters is a 73 y.o. female with A-fib on Xarelto , hypertension, CKD stage IIIa, prior history of stroke, history of systolic heart failure EF of 45%, hypertension who presented with nausea, vomiting, and diarrhea for 1-2 weeks prior to presentation. BNP on admission was 720. PCT on admission was 31.85. HS-tropnonin was 283 > 331. Lactic acid was 2.6 on admission, improving to 1.7 with re-hydration. Free T4 is elevated and TSH is normal. No acute findings on chest XR.   Pertinent cardiac history: Echo in 08/2014 noted LVEF 60-65% with mild-moderate MR. Echo in 02/2020 showed LVEF of 60-65% with grade II diastolic dysfunction, mild MR. TEE 09/2021 noted vegetation on mitral valve. Cath in 10/2021 with nonobstructive coronaries. Bioprosthetic mitral valve replacement in 10/2021. Echo 07/2022 with LVEF of 45%, low normal RV function, stable bioprosthetic mitral valve. Echo 06/11/23 showed LVEF of 50-55% with mildly reduced RV function, moderate-severe mitral stenosis, and mild-moderate TR. TEE 06/11/23 showed subacute bacterial endocarditis with mobile vegetation on the bioprosthetic mitral valve.  Pertinent Lab Values: Creatinine, Ser  Date Value Ref Range Status  06/11/2023 1.02 (H) 0.44 - 1.00 mg/dL Final   BUN  Date Value Ref Range Status  06/11/2023 13 8 - 23 mg/dL Final  98/89/7974 13 8 - 27 mg/dL Final   Potassium  Date Value Ref Range Status  06/11/2023 3.6 3.5 - 5.1 mmol/L Final   Sodium  Date Value Ref Range Status  06/11/2023 135 135 - 145 mmol/L Final  05/16/2023 146 (H) 134 - 144 mmol/L Final   B Natriuretic Peptide  Date Value Ref Range Status  06/09/2023 720.2 (H) 0.0 - 100.0 pg/mL Final    Comment:    Performed at [facility], [address] [place], [place] [address]   Magnesium   Date Value Ref Range Status  06/10/2023 2.3 1.7 - 2.4  mg/dL Final    Comment:    Performed at [facility], [address] [place], [place] [address]   Hgb A1c MFr Bld  Date Value Ref Range Status  08/16/2021 5.9 (H) 4.8 - 5.6 % Final    Comment:             Prediabetes: 5.7 - 6.4          Diabetes: >6.4          Glycemic control for adults with diabetes: <7.0    TSH  Date Value Ref Range Status  06/10/2023 1.423 0.350 - 4.500 uIU/mL Final    Comment:    Performed by a 3rd Generation assay with a functional sensitivity of <=0.01 uIU/mL. Performed at [facility], [address] [place], [place] [address]   05/16/2023 1.880 0.450 - 4.500 uIU/mL Final    Vital Signs:  Temp:  [97.8 F (36.6 C)-98.3 F (36.8 C)] 98.1 F (36.7 C) (02/06 0819) Pulse Rate:  [55-108] 108 (02/06 0819) Cardiac Rhythm: Atrial fibrillation (02/06 0700) Resp:  [18-40] 20 (02/06 0819) BP: (81-149)/(32-67) 146/58 (02/06 0819) SpO2:  [75 %-99 %] 98 % (02/06 0819) Weight:  [102 kg (224 lb 13.9 oz)] 102 kg (224 lb 13.9 oz) (02/05 1205)  Intake/Output Summary (Last 24 hours) at 06/12/2023 1120 Last data filed at 06/12/2023 1006 Gross per 24 hour  Intake 1171.56 ml  Output 209 ml  Net 962.56 ml   Current Heart Failure Medications:  Loop diuretic: none Beta-Blocker: metoprolol  25 mg daily ACEI/ARB/ARNI:  none MRA: none SGLT2i: none  Prior to admission Heart Failure Medications:  Loop diuretic: none Beta-Blocker: metoprolol  succinate 50 mg daily ACEI/ARB/ARNI: losartan  25 mg daily MRA: none SGLT2i: none Other: none  Assessment: 1. Acute on chronic diastolic heart failure (LVEF 50-55%) with mildly reduced RV function, due to NICM. NYHA class II-III symptoms.  -Symptoms: Reports feeling slightly worse than yesterdau. Has some shortness of breath, fatigue, and poor appetite.  -Volume: Hypovolemic on admission, appears to be closer to euvolemic today.  -Hemodynamics: BP is labile with several low and high readings. Heart rate is also  variable, currently in the 100s, though several during admission in 50-60s. -BB: Home metoprolol  reduce to 25 mg daily, though not given yesterday.   -ACEI/ARB/ARNI: Held at this time given lower BP on admission. Can consider resuming at a later date. -MRA: Not filling spironolactone  for several months. Can consider resuming when infection is stable, -SGLT2i: Would not initiate during acute infectious process. Last UTI was in 2021. -Thyroid  hormones abnormal. On amiodarone  for AF. -Cardiology noted concern for vegetation of prosthetic mitral valve, planning TEE.   Plan: 1) Medication changes recommended at this time: -None  2) Patient assistance: -Pending  3) Education: - Patient has been educated on current HF medications and potential additions to HF medication regimen - Patient verbalizes understanding that over the next few months, these medication doses may change and more medications may be added to optimize HF regimen - Patient has been educated on basic disease state pathophysiology and goals of therapy  Medication Assistance / Insurance Benefits Check: Does the patient have prescription insurance?    Type of insurance plan:  Does the patient qualify for medication assistance through manufacturers or grants? Pending  Outpatient Pharmacy: Prior to admission outpatient pharmacy: [facility]      Please do not hesitate to reach out with questions or concerns,  Jaun Bash, PharmD, CPP, BCPS Heart Failure Pharmacist  Phone - [phone] 06/12/2023 11:20 AM

## 2023-06-12 NOTE — Progress Notes (Signed)
 ANTICOAGULATION CONSULT NOTE  Pharmacy Consult for heparin  infusion Indication: afib  No Known Allergies  Patient Measurements: Height: 5' 5 (165.1 cm) Weight: 102 kg (224 lb 13.9 oz) IBW/kg (Calculated) : 57 Heparin  Dosing Weight: 80.5 kg  Vital Signs: Temp: 98.1 F (36.7 C) (02/06 0819) Temp Source: Oral (02/06 0819) BP: 146/58 (02/06 0819) Pulse Rate: 108 (02/06 0819)  Labs: Recent Labs    06/09/23 2153 06/10/23 0053 06/10/23 0359 06/10/23 0731 06/10/23 1242 06/10/23 1531 06/10/23 2253 06/11/23 0635 06/11/23 1123 06/11/23 2008 06/12/23 0723  HGB 10.2*  --   --   --   --  9.4*  --  8.5*  --   --  9.1*  HCT 31.4*  --   --   --   --  29.7*  --  27.5*  --   --  28.5*  PLT 186  --   --   --   --  178  --  160  --   --  160  APTT  --   --  37*  --    < >  --    < >  --  78* 55* 90*  LABPROT  --   --  18.9*  --   --   --   --   --   --   --   --   INR  --   --  1.6*  --   --   --   --   --   --   --   --   HEPARINUNFRC  --   --  >1.10*  --   --   --   --   --   --   --  0.36  CREATININE 1.14*  --   --   --   --  1.12*  --  1.02*  --   --   --   TROPONINIHS 283* 331*  --  362*  --  228*  --   --   --   --   --    < > = values in this interval not displayed.    Estimated Creatinine Clearance: 59 mL/min (A) (by C-G formula based on SCr of 1.02 mg/dL (H)).   Medical History: Past Medical History:  Diagnosis Date   Allergy    Anxiety    Arthritis    Asthma    Chronic back pain    Chronic combined systolic (congestive) and diastolic (congestive) heart failure (HCC)    a. 02/2020 Echo: EF 60-65%, no rwma. Gr2 DD. Nl RV size/fxn. Mild LAE. Mild MR; b. 10/2021 Echo: [facility] [place] following MVR) EF 40 to 45%.   Depression    Diverticulitis    Endocarditis    a. 10/2021 S sanguinous bacteremia w/ MV veg-->5mm Mitris bioprosthetic valve, Maze, and LAA clip.   Hyperlipidemia    Hypertension    Lipoma    Mild mitral regurgitation    a. 08/2016 Echo: mild MR; b. 02/2020 Echo:  Mild MR.   Neuromuscular disorder (HCC)    Obesity    Obstructive sleep apnea    PAF (paroxysmal atrial fibrillation) (HCC)    a. Dx 05/2016--> Xarelto  (CHA2DS2VASc = 3); b. 08/2016 Echo: EF 55-60%, no rwma, mild MR, mod dil LA; c. 03/2020 [facility]: Avg HR 61 w/ Afib burden of 24% - max rate 189 (avg 93). 2 pauses - longest 4.7 secs post-conversion pauses (occurred @ 5:42 am); d. 04/2022 s/p DCCV; e. 08/2022 s/p DCCV.  Paroxysmal SVT (supraventricular tachycardia) (HCC)    S/P mitral valve replacement    a. 10/2021 [facility] - Endocarditis/MV Veg-->8mm Mitris bioprosthetic valve, Maze, and LAA clip.   Sleep apnea    Stroke [facility])    a. 02/2020 MRI/A: multifocal acute ischemia in R MCA territory predominantly involving post insula and R middle frontal gyrus. Nl MRA.   Tachy-brady syndrome (HCC)    Tobacco abuse    Medications:  PTA Meds: Rivaroxaban  20 mg qpm, last dose unknown  Assessment: Pt is a 73 yo female presenting to ED c/o diarrhea x6 days and vomiting. Reports feeling of heart fluttering and pain that started x2 days ago, found with elevated BNP and Troponin I level trending up. Pt has hx of afib and is on rivaroxaban . CHADSVASc 6. Now found to have endocarditis with hx of mitral bioprosthetic valve.   Goal of Therapy:  Heparin  level 0.3-0.7 units/ml Monitor platelets by anticoagulation protocol: Yes  Monitoring: 2/4: aPTT @ 1242 was 39 Subtherapeutic 2/4 2253 aPTT 54, subtherapeutic 2/5 1123 aPTT 78 2/5 2008 aPTT 55  SUBtherapeutic 2/6 0723 aPTT 90 HL 0.36.   No issues with infusion reported, no signs/symptoms of bleeding noted.  Plan: aPTT level is therapeutic and heparin  level seems to be correlating. Will switch to heparin  level monitoring. Will continue heparin  infusion at 1850 units/hr. Recheck heparin  level in 8 hours. CBC daily while on heparin .    Thank you for involving pharmacy in this patient's care.   Cathaleen Blanch, PharmD Clinical Pharmacist 06/12/2023 9:45 AM

## 2023-06-12 NOTE — Progress Notes (Signed)
 Heart Failure Navigator Progress Note  Assessed for Heart & Vascular TOC clinic readiness.  Patient does not meet criteria due to current admission for A-Fib and Endocarditis. Patient goes to [facility]- Deatrice Cage, [facility].  Navigator will sign off at this time.  Charmaine Pines, RN, BSN [facility] Heart Failure Navigator Secure Chat Only

## 2023-06-13 ENCOUNTER — Inpatient Hospital Stay: Payer: Medicare HMO

## 2023-06-13 DIAGNOSIS — T826XXA Infection and inflammatory reaction due to cardiac valve prosthesis, initial encounter: Secondary | ICD-10-CM | POA: Diagnosis not present

## 2023-06-13 DIAGNOSIS — Z9889 Other specified postprocedural states: Secondary | ICD-10-CM | POA: Diagnosis not present

## 2023-06-13 DIAGNOSIS — I480 Paroxysmal atrial fibrillation: Secondary | ICD-10-CM | POA: Diagnosis not present

## 2023-06-13 DIAGNOSIS — K529 Noninfective gastroenteritis and colitis, unspecified: Secondary | ICD-10-CM | POA: Diagnosis not present

## 2023-06-13 DIAGNOSIS — R7881 Bacteremia: Secondary | ICD-10-CM | POA: Diagnosis not present

## 2023-06-13 DIAGNOSIS — B9689 Other specified bacterial agents as the cause of diseases classified elsewhere: Secondary | ICD-10-CM | POA: Diagnosis not present

## 2023-06-13 LAB — CBC
HCT: 33 % — ABNORMAL LOW (ref 36.0–46.0)
Hemoglobin: 10.2 g/dL — ABNORMAL LOW (ref 12.0–15.0)
MCH: 26.6 pg (ref 26.0–34.0)
MCHC: 30.9 g/dL (ref 30.0–36.0)
MCV: 86.2 fL (ref 80.0–100.0)
Platelets: 192 10*3/uL (ref 150–400)
RBC: 3.83 MIL/uL — ABNORMAL LOW (ref 3.87–5.11)
RDW: 16.8 % — ABNORMAL HIGH (ref 11.5–15.5)
WBC: 14.7 10*3/uL — ABNORMAL HIGH (ref 4.0–10.5)
nRBC: 0.1 % (ref 0.0–0.2)

## 2023-06-13 LAB — HEPARIN LEVEL (UNFRACTIONATED)
Heparin Unfractionated: <0.1 [IU]/mL — ABNORMAL LOW (ref 0.30–0.70)
Heparin Unfractionated: 0.48 [IU]/mL (ref 0.30–0.70)
Heparin Unfractionated: 0.65 [IU]/mL (ref 0.30–0.70)

## 2023-06-13 MED ORDER — HEPARIN BOLUS VIA INFUSION
2400.0000 [IU] | Freq: Once | INTRAVENOUS | Status: AC
  Administered 2023-06-13: 2400 [IU] via INTRAVENOUS
  Filled 2023-06-13: qty 2400

## 2023-06-13 MED ORDER — METOPROLOL TARTRATE 25 MG PO TABS
25.0000 mg | ORAL_TABLET | Freq: Two times a day (BID) | ORAL | Status: DC
  Administered 2023-06-13 – 2023-06-14 (×2): 25 mg via ORAL
  Filled 2023-06-13 (×3): qty 1

## 2023-06-13 NOTE — Evaluation (Signed)
 Occupational Therapy Evaluation Patient Details Name: Tina Peters MRN: [id number] DOB: [birth year] Today's Date: 06/13/2023   History of Present Illness 73 y/o female presented to ED on 06/09/23 for diarrhea x 6 days and vomitting along with heart fluttering and pain x 2 days. Admitted for acute gastroenteritis and sepsis. PMH: CHF, HTN, Afib on anticoagulation, hx of CVA, OSA, CKD stage IIIa   Clinical Impression   Tina Peters was seen for OT evaluation this date. Prior to hospital admission, pt was IND. Pt lives alone, can stay with her daughter as needed. Pt currently requires SBA sit<>stand with no AD. MIN A don B socks in sitting. SBA + RW for ADL t/f and standing grooming tasks, requires multiple standing and seated rest breaks. Max HR 143 with activity, SpO2 93% on RA. Pt would benefit from skilled OT to address noted impairments and functional limitations (see below for any additional details). Upon hospital discharge, recommend no OT follow up.     If plan is discharge home, recommend the following: Help with stairs or ramp for entrance    Functional Status Assessment  Patient has had a recent decline in their functional status and demonstrates the ability to make significant improvements in function in a reasonable and predictable amount of time.  Equipment Recommendations  Tub/shower seat    Recommendations for Other Services       Precautions / Restrictions Precautions Precautions: None Restrictions Weight Bearing Restrictions Per Provider Order: No      Mobility Bed Mobility               General bed mobility comments: not tested    Transfers Overall transfer level: Needs assistance Equipment used: None Transfers: Sit to/from Stand Sit to Stand: Contact guard assist                  Balance Overall balance assessment: Needs assistance Sitting-balance support: No upper extremity supported Sitting balance-Leahy Scale: Good     Standing balance  support: No upper extremity supported, During functional activity Standing balance-Leahy Scale: Fair                             ADL either performed or assessed with clinical judgement   ADL Overall ADL's : Needs assistance/impaired                                       General ADL Comments: MIN A don B socks in sitting. SBA + RW for ADL t/f and standing grooming tasks, requires multiple standing and seated rest breaks      Pertinent Vitals/Pain Pain Assessment Pain Assessment: No/denies pain     Extremity/Trunk Assessment Upper Extremity Assessment Upper Extremity Assessment: Overall WFL for tasks assessed   Lower Extremity Assessment Lower Extremity Assessment: Generalized weakness       Communication Communication Communication: No apparent difficulties   Cognition Arousal: Alert Behavior During Therapy: WFL for tasks assessed/performed Overall Cognitive Status: Within Functional Limits for tasks assessed                                                  Home Living Family/patient expects to be discharged to:: Private residence Living Arrangements: Alone Available Help  at Discharge: Family Type of Home: House Home Access: Stairs to enter [employer] of Steps: 3+1 Entrance Stairs-Rails: Can reach both Home Layout: Two level;Able to live on main level with bedroom/bathroom               Home Equipment: Rolling Walker (2 wheels);Cane - single point          Prior Functioning/Environment Prior Level of Function : Independent/Modified Independent;Driving             Mobility Comments: no AD use ADLs Comments: pt makes homemade candy        OT Problem List: Decreased strength;Decreased activity tolerance      OT Treatment/Interventions: Self-care/ADL training;Therapeutic exercise;Energy conservation;DME and/or AE instruction;Therapeutic activities;Patient/family education    OT  Goals(Current goals can be found in the care plan section) Acute Rehab OT Goals Patient Stated Goal: to go home OT Goal Formulation: With patient Time For Goal Achievement: 06/27/23 Potential to Achieve Goals: Good ADL Goals Pt Will Perform Grooming: Independently;standing Pt Will Perform Lower Body Dressing: Independently;sit to/from stand Pt Will Transfer to Toilet: Independently;ambulating;regular height toilet  OT Frequency: Min 1X/week    Co-evaluation              AM-PAC OT 6 Clicks Daily Activity     Outcome Measure Help from another person eating meals?: None Help from another person taking care of personal grooming?: None Help from another person toileting, which includes using toliet, bedpan, or urinal?: A Little Help from another person bathing (including washing, rinsing, drying)?: A Little Help from another person to put on and taking off regular upper body clothing?: None Help from another person to put on and taking off regular lower body clothing?: A Little 6 Click Score: 21   End of Session Equipment Utilized During Treatment: Rolling walker (2 wheels)  Activity Tolerance: Patient tolerated treatment well Patient left: in chair;with call bell/phone within reach  OT Visit Diagnosis: Other abnormalities of gait and mobility (R26.89);Muscle weakness (generalized) (M62.81)                Time: 1130-1150 OT Time Calculation (min): 20 min Charges:  OT General Charges $OT Visit: 1 Visit OT Evaluation $OT Eval Moderate Complexity: 1 Mod  Tina Peters, M.S. OTR/L  06/13/23, 12:31 PM  ascom [phone]

## 2023-06-13 NOTE — Progress Notes (Signed)
 PROGRESS NOTE    Tina Peters   FMW:[id number] DOB: 1950/05/23  DOA: 06/09/2023 Date of Service: 06/13/23 which is hospital day 3  PCP: Myrla Jon HERO, MD    Hospital course / significant events:   HPI: 73 year old female history of A-fib on Xarelto , hypertension, CKD stage IIIa, prior history of stroke, history of systolic heart failure EF of 45%, hypertension, presents to the ER with a 1-week history of nausea, vomiting and diarrhea.  Patient states that she got sick last week.  Was seen in the ER on January 26.  She had palpitations that time.  Workup was negative.  Patient discharged to home.  She followed up in the office and was diagnosed with flulike symptoms.  She had a respiratory viral panel that was negative.  She continued to have diarrhea along with nausea and vomiting.  She states that she felt some tightness in her chest and her abdomen.  She has been vomiting.  She felt some palpitations in the middle of the night.  She came to the ER 02/03 for evaluation.  02/03: to ED evening - elevated BNP, procalcitonin, troponin 283 --> 331 started on heparin , lactate 2.6 -(1L IVF)--> 1.7, started on broad spectrum abx cefepime  + vanc + flagyl .  02/04: admitted to hospitalist service w/ concern for gastroenteritis w/ concern for associated sepsis (reasonably ruled out UTI, PNA, colitis or other intraabdominal bacterial infection).  02/05: unfortunately (+)TEE for mitral valve vegetation.  Blood culture (+)gram positive cocci in anaerobic bottle. ID consulted --> await final BCx results, continue ceftriaxone  + flagyl , checking CDiff and may need colonoscopy, recs for MRI spine d/t pain/bacteremia. Pt reports diarrhea has improved but still present. CDiff neg. Presented in sinus rhythm with development of A-fib at 21:00 on 06/11/2023, remains in A-fib with ventricular rates largely in the low 100s bpm with brief episodes into the 120s to 140s bpm  02/06: cardiology adjusting amio and beta  blocker. MRI T/L spine: no findings to indicate osteomyelitis/discitis. GPC culture reincubated for better growth 02/07: repeat BCx draw this morning       Consultants:  Cardiology  Infectious disease   Procedures/Surgeries: TEE 06/11/23       ASSESSMENT & PLAN:   Sepsis with acute organ dysfunction without septic shock  Initially thought d/t GI illness, now w/ dx endocarditis, sepsis likely multifactorial   Endocarditis  Mitral valve endocarditis, s/p bioprosthetic AVR in 10/2021  Gram positive cocci bacteremia  Confirmed on TEE 02/05 showing mobile opacity on posterior leaflet mitral valve prosthetic valve.  Infection likely caught relatively early given no significant valve regurgitation, grossly no abscess formation though will need to be monitored closely  Hx: June 2023 admitted to [facility] Streptococcus sanguinous bacteremia from infected tooth with mitral valve vegetation, at that time underwent mitral valve replacement with bioprosthetic valve ID following Expect long-term IV Abx, PICC line MRI spine L and T spine given back pain in both areas --> no evidence osteo/discitis Following for BCx ID/Susc  Following second set BCx Cardiology following, tentative plan for repeat TEE in 6 weeks to reevaluate mitral valve  For any worsening valve damage, will need CT surgery consultation   Acute gastroenteritis - improving  Clinically, concern for norovirus vs other viral illness, CDiff engative  check stool studies --> negative / nothing detected  Enteric/contact precautions either way  Diet adv as tolerated     Back pain Ruled out osteomyelitis/discitis Tylenol  scheduled for next 1-2 days Tramadol  prn mod-severe Fentanyl  IV prn breakthrough  Pt educated on goal to avoid IV pain meds if at all able to  Demand ischemia  Elevated troponin Favor demand ischarmia over NSTEMI Monitoring for chest pain   Hypokalemia D/t diarrhea Replace as needed Monitor BMP   Chronic  paroxysmal A-fib  On admission, EKG shows normal sinus rhythm.   Had more rapid rate here transiently Xarelto  for anticoagulation at home - held while on heparin  here and heparin  here in case needing further procedure but anticipate xarelto  prior to dc  Reloaded w/ amiodarone  po  Toprol  switched to lopressor  Follow w/ cardiology    On amiodarone  Thyroid  panel per cardiology   HFrEF (heart failure with reduced ejection fraction)  On admission, patient has actually intra vascular volume depleted.   Hold her Aldactone , Cozaar  for now given soft BP  Cardiology following    Stage 3a chronic kidney disease (HCC) - Baseline creatinine 1.1-1.4 On admission, patient has a history of CKD stage IIIa.  Baseline creatinine 1.1-1.4.  Cr on admission is close to baseline, no AKI Hold her Aldactone  and her losartan  for now.   OSA (obstructive sleep apnea) On admission continue CPAP   Essential hypertension Home blood pressure medications include Aldactone , Toprol -XL, Cozaar .   hold her Cozaar  and Aldactone  due to dehydration.   continue Toprol -XL with hold parameters    Class 2 obesity based on BMI: Body mass index is 37.42 kg/m.  Underweight - under 18  overweight - 25 to 29 obese - 30 or more Class 1 obesity: BMI of 30.0 to 34 Class 2 obesity: BMI of 35.0 to 39 Class 3 obesity: BMI of 40.0 to 49 Super Morbid Obesity: BMI 50-59 Super-super Morbid Obesity: BMI 60+ Significantly low or high BMI is associated with higher medical risk.  Weight management advised as adjunct to other disease management and risk reduction treatments    DVT prophylaxis: heparin  IV fluids: no continuous IV fluids  Nutrition: regular diet Central lines / invasive devices: none  Code Status: FULL CODE ACP documentation reviewed:  none on file in [facility]  TOC needs: home health, PT/OT and IV abx  Barriers to dispo / significant pending items: IV abx, PICC line, repeat BCx, expect will be here several days  pending ID clearance and abx plan prior to discharge but expect this can be in place on Monday pending further complications and pending second blood cultures              Subjective / Brief ROS:  Patient reports diarrhea has improved overall but still present Pt reports low appetitie but no abd pain, no nausea Reports low urine output no feeling of bladder fullness  Reports back pain - middle and lower pain better than yesterday  Denies CP/SOB.  Denies new weakness. .   Family Communication: no new updates at this time     Objective Findings:  Vitals:   06/13/23 0406 06/13/23 0903 06/13/23 1314 06/13/23 1400  BP: 115/82 (!) 143/119 123/65   Pulse: 70 66 (!) 49   Resp: 19  18   Temp: 98.5 F (36.9 C)  97.9 F (36.6 C)   TempSrc: Oral  Oral   SpO2: 91%  97% 99%  Weight:      Height:        Intake/Output Summary (Last 24 hours) at 06/13/2023 1521 Last data filed at 06/13/2023 1418 Gross per 24 hour  Intake 975.75 ml  Output 100 ml  Net 875.75 ml   Filed Weights   06/09/23 2135 06/11/23 1205  Weight: 102 kg 102 kg    Examination:  Physical Exam Constitutional:      General: She is not in acute distress. Cardiovascular:     Rate and Rhythm: Normal rate and regular rhythm.  Pulmonary:     Effort: Pulmonary effort is normal.     Breath sounds: Normal breath sounds.  Neurological:     General: No focal deficit present.     Mental Status: She is alert. Mental status is at baseline.          Scheduled Medications:   acetaminophen   650 mg Oral Q6H   amiodarone   400 mg Oral BID   aspirin  EC  81 mg Oral Daily   metoprolol  tartrate  25 mg Oral BID   sertraline   50 mg Oral Daily    Continuous Infusions:  cefTRIAXone  (ROCEPHIN )  IV Stopped (06/13/23 0106)   heparin  2,100 Units/hr (06/13/23 1212)   metronidazole  500 mg (06/13/23 0903)    PRN Medications:  cyclobenzaprine , fentaNYL  (SUBLIMAZE ) injection, ondansetron  **OR** ondansetron  (ZOFRAN ) IV,  traMADol   Antimicrobials from admission:  Anti-infectives (From admission, onward)    Start     Dose/Rate Route Frequency Ordered Stop   06/11/23 2200  metroNIDAZOLE  (FLAGYL ) IVPB 500 mg        500 mg 100 mL/hr over 60 Minutes Intravenous Every 12 hours 06/11/23 1628     06/11/23 0015  cefTRIAXone  (ROCEPHIN ) 2 g in sodium chloride  0.9 % 100 mL IVPB        2 g 200 mL/hr over 30 Minutes Intravenous Every 24 hours 06/10/23 2320     06/11/23 0000  metroNIDAZOLE  (FLAGYL ) tablet 500 mg  Status:  Discontinued        500 mg Oral Every 12 hours 06/10/23 2320 06/11/23 1628   06/09/23 2245  ceFEPIme  (MAXIPIME ) 2 g in sodium chloride  0.9 % 100 mL IVPB        2 g 200 mL/hr over 30 Minutes Intravenous  Once 06/09/23 2238 06/09/23 2324   06/09/23 2245  metroNIDAZOLE  (FLAGYL ) IVPB 500 mg        500 mg 100 mL/hr over 60 Minutes Intravenous  Once 06/09/23 2238 06/10/23 0035   06/09/23 2245  vancomycin  (VANCOCIN ) IVPB 1000 mg/200 mL premix        1,000 mg 200 mL/hr over 60 Minutes Intravenous  Once 06/09/23 2238 06/10/23 0035           Data Reviewed:  I have personally reviewed the following...  CBC: Recent Labs  Lab 06/09/23 2153 06/10/23 1531 06/11/23 0635 06/12/23 0723 06/13/23 0206  WBC 43.3* 22.2* 16.8* 12.0* 14.7*  NEUTROABS 38.8* 20.1* 13.8*  --   --   HGB 10.2* 9.4* 8.5* 9.1* 10.2*  HCT 31.4* 29.7* 27.5* 28.5* 33.0*  MCV 82.2 84.9 84.1 83.6 86.2  PLT 186 178 160 160 192   Basic Metabolic Panel: Recent Labs  Lab 06/09/23 2153 06/10/23 1531 06/11/23 0635  NA 133* 137 135  K 2.8* 3.7 3.6  CL 97* 103 106  CO2 20* 24 22  GLUCOSE 139* 130* 99  BUN 17 15 13   CREATININE 1.14* 1.12* 1.02*  CALCIUM  8.1* 7.9* 7.8*  MG 1.8 2.3  --    GFR: Estimated Creatinine Clearance: 59 mL/min (A) (by C-G formula based on SCr of 1.02 mg/dL (H)). Liver Function Tests: Recent Labs  Lab 06/09/23 2153 06/11/23 0635  AST 53* 27  ALT 27 19  ALKPHOS 107 77  BILITOT 1.8* 1.0  PROT 6.8  5.7*  ALBUMIN 2.9* 2.4*   Recent Labs  Lab 06/09/23 2153  LIPASE 22   No results for input(s): AMMONIA in the last 168 hours. Coagulation Profile: Recent Labs  Lab 06/10/23 0359  INR 1.6*   Cardiac Enzymes: No results for input(s): CKTOTAL, CKMB, CKMBINDEX, TROPONINI in the last 168 hours. BNP (last 3 results) No results for input(s): PROBNP in the last 8760 hours. HbA1C: No results for input(s): HGBA1C in the last 72 hours. CBG: No results for input(s): GLUCAP in the last 168 hours. Lipid Profile: No results for input(s): CHOL, HDL, LDLCALC, TRIG, CHOLHDL, LDLDIRECT in the last 72 hours. Thyroid  Function Tests: Recent Labs    06/10/23 1553 06/10/23 2253  TSH 0.890 1.423  FREET4 2.01*  --    Anemia Panel: No results for input(s): VITAMINB12, FOLATE, FERRITIN, TIBC, IRON, RETICCTPCT in the last 72 hours. Most Recent Urinalysis On File:     Component Value Date/Time   COLORURINE YELLOW (A) 06/09/2023 2153   APPEARANCEUR CLEAR (A) 06/09/2023 2153   LABSPEC 1.028 06/09/2023 2153   PHURINE 6.0 06/09/2023 2153   GLUCOSEU NEGATIVE 06/09/2023 2153   HGBUR SMALL (A) 06/09/2023 2153   BILIRUBINUR NEGATIVE 06/09/2023 2153   KETONESUR NEGATIVE 06/09/2023 2153   PROTEINUR NEGATIVE 06/09/2023 2153   NITRITE NEGATIVE 06/09/2023 2153   LEUKOCYTESUR NEGATIVE 06/09/2023 2153   Sepsis Labs: @LABRCNTIP (procalcitonin:4,lacticidven:4) Microbiology: Recent Results (from the past 240 hours)  Respiratory virus panel     Status: None   Collection Time: 06/03/23  3:57 PM   Specimen: Nasal Swab; Respiratory  Result Value Ref Range Status   Adenovirus B Not Detected Not Detected Final   Rhinovirus Not Detected Not Detected Final   Influenza A Not Detected Not Detected Final   INFLUENZA A SUBTYPE H1 Not Detected Not Detected Final   INFLUENZA A SUBTYPE H3 Not Detected Not Detected Final   Influenza B Not Detected Not Detected Final    Metapneumovirus Not Detected Not Detected Final   Respiratory Syncytial Virus A Not Detected Not Detected Final   Respiratory Syncytial Virus B Not Detected Not Detected Final   HUMAN PARAINFLU VIRUS 1 Not Detected Not Detected Final   HUMAN PARAINFLU VIRUS 2 Not Detected Not Detected Final   HUMAN PARAINFLU VIRUS 3 Not Detected Not Detected Final   Comment see note  Final    Comment: THIS ASSAY WILL NOT DETECT SARS-CoV-2 (COVID-19) . This test is performed using the [employer]. . . A positive Influenza A associated with negative H1 or H3 subtyping results do not preclude influenza A virus H1/ H3 infections. Low level positive Influenza A may result in unsubtypeable seasonal Influenza. Samples can be submitted for further testing at state public health laboratories if available. . The manufacturer of a reagent used for this assay has informed laboratories that human metapneumovirus positive results obtained performing this test, especially when detected in a co-infection, should be confirmed with an alternative molecular human metapneumovirus assay, if medically indicated. The results of this test should not be used as the sole basis for diagnosis, treatment, or other patient management decisions. For inquiries, please contact [phone] ([phone]). . A positive Influenza A associated with negative H1 or H3 subtypin g results do not preclude influenza A virus H1/H3 infections. Low level positive Influenza A may result in unsubtypeable seasonal Influenza. Samples can be submitted for further testing at state public health laboratories if available. . Limitations: A negative result does not rule out respiratory pathogen infection below the sensitivity limit  of the assay. The sensitivity depends on pathogen and sample type. . This assay cannot reliably distinguish between Rhinovirus and Enterovirus due to genetic similarities between the  viruses. .   Gastrointestinal Panel by PCR , Stool     Status: None   Collection Time: 06/09/23 12:42 PM   Specimen: Stool  Result Value Ref Range Status   Campylobacter species NOT DETECTED NOT DETECTED Final   Plesimonas shigelloides NOT DETECTED NOT DETECTED Final   Salmonella species NOT DETECTED NOT DETECTED Final   Yersinia enterocolitica NOT DETECTED NOT DETECTED Final   Vibrio species NOT DETECTED NOT DETECTED Final   Vibrio cholerae NOT DETECTED NOT DETECTED Final   Enteroaggregative E coli (EAEC) NOT DETECTED NOT DETECTED Final   Enteropathogenic E coli (EPEC) NOT DETECTED NOT DETECTED Final   Enterotoxigenic E coli (ETEC) NOT DETECTED NOT DETECTED Final   Shiga like toxin producing E coli (STEC) NOT DETECTED NOT DETECTED Final   Shigella/Enteroinvasive E coli (EIEC) NOT DETECTED NOT DETECTED Final   Cryptosporidium NOT DETECTED NOT DETECTED Final   Cyclospora cayetanensis NOT DETECTED NOT DETECTED Final   Entamoeba histolytica NOT DETECTED NOT DETECTED Final   Giardia lamblia NOT DETECTED NOT DETECTED Final   Adenovirus F40/41 NOT DETECTED NOT DETECTED Final   Astrovirus NOT DETECTED NOT DETECTED Final   Norovirus GI/GII NOT DETECTED NOT DETECTED Final   Rotavirus A NOT DETECTED NOT DETECTED Final   Sapovirus (I, II, IV, and V) NOT DETECTED NOT DETECTED Final    Comment: Performed at [facility], [address] [place], [place] [address]  Resp panel by RT-PCR (RSV, Flu A&B, Covid) Anterior Nasal Swab     Status: None   Collection Time: 06/09/23  9:53 PM   Specimen: Anterior Nasal Swab  Result Value Ref Range Status   SARS Coronavirus 2 by RT PCR NEGATIVE NEGATIVE Final    Comment: (NOTE) SARS-CoV-2 target nucleic acids are NOT DETECTED.  The SARS-CoV-2 RNA is generally detectable in upper respiratory specimens during the acute phase of infection. The lowest concentration of SARS-CoV-2 viral copies this assay can detect is 138 copies/mL. A negative result  does not preclude SARS-Cov-2 infection and should not be used as the sole basis for treatment or other patient management decisions. A negative result may occur with  improper specimen collection/handling, submission of specimen other than nasopharyngeal swab, presence of viral mutation(s) within the areas targeted by this assay, and inadequate number of viral copies(<138 copies/mL). A negative result must be combined with clinical observations, patient history, and epidemiological information. The expected result is Negative.  Fact Sheet for Patients:  bloggercourse.com  Fact Sheet for Healthcare Providers:  seriousbroker.it  This test is no t yet approved or cleared by the [place]  FDA and  has been authorized for detection and/or diagnosis of SARS-CoV-2 by FDA under an Emergency Use Authorization (EUA). This EUA will remain  in effect (meaning this test can be used) for the duration of the COVID-19 declaration under Section 564(b)(1) of the Act, 21 U.S.C.section 360bbb-3(b)(1), unless the authorization is terminated  or revoked sooner.       Influenza A by PCR NEGATIVE NEGATIVE Final   Influenza B by PCR NEGATIVE NEGATIVE Final    Comment: (NOTE) The Xpert Xpress SARS-CoV-2/FLU/RSV plus assay is intended as an aid in the diagnosis of influenza from Nasopharyngeal swab specimens and should not be used as a sole basis for treatment. Nasal washings and aspirates are unacceptable for Xpert Xpress SARS-CoV-2/FLU/RSV testing.  Fact Sheet for Patients: bloggercourse.com  Fact Sheet for Healthcare Providers: seriousbroker.it  This test is not yet approved or cleared by the [place]  FDA and has been authorized for detection and/or diagnosis of SARS-CoV-2 by FDA under an Emergency Use Authorization (EUA). This EUA will remain in effect (meaning this test can be used) for  the duration of the COVID-19 declaration under Section 564(b)(1) of the Act, 21 U.S.C. section 360bbb-3(b)(1), unless the authorization is terminated or revoked.     Resp Syncytial Virus by PCR NEGATIVE NEGATIVE Final    Comment: (NOTE) Fact Sheet for Patients: bloggercourse.com  Fact Sheet for Healthcare Providers: seriousbroker.it  This test is not yet approved or cleared by the [place]  FDA and has been authorized for detection and/or diagnosis of SARS-CoV-2 by FDA under an Emergency Use Authorization (EUA). This EUA will remain in effect (meaning this test can be used) for the duration of the COVID-19 declaration under Section 564(b)(1) of the Act, 21 U.S.C. section 360bbb-3(b)(1), unless the authorization is terminated or revoked.  Performed at [facility], [address] [place], [place] [address]   Culture, blood (single)     Status: None   Collection Time: 06/09/23 10:59 PM   Specimen: BLOOD LEFT ARM  Result Value Ref Range Status   Specimen Description   Final    BLOOD LEFT ARM Performed at [facility], [facility], [address], [place], [place] [address]    Special Requests   Final    BOTTLES DRAWN AEROBIC AND ANAEROBIC Blood Culture results may not be optimal due to an inadequate volume of blood received in culture bottles Performed at [facility], [address] [place], [place] [address]    Culture  Setup Time   Final    GRAM POSITIVE COCCI IN BOTH AEROBIC AND ANAEROBIC BOTTLES CRITICAL RESULT CALLED TO, READ BACK BY AND VERIFIED WITH: SHEEMA HALLAJI PHARMD [id number] 06/11/23 HNM GRAM STAIN REVIEWED-AGREE WITH RESULT DRT    Culture   Final    GAMMA HEMOLYSIS Standardized susceptibility testing for this organism is not available. Performed at [facility], [address] [place], [place] [address]    Report Status 06/13/2023 FINAL  Final  Blood Culture ID Panel (Reflexed)     Status:  None   Collection Time: 06/09/23 10:59 PM  Result Value Ref Range Status   Enterococcus faecalis NOT DETECTED NOT DETECTED Final   Enterococcus Faecium NOT DETECTED NOT DETECTED Final   Listeria monocytogenes NOT DETECTED NOT DETECTED Final   Staphylococcus species NOT DETECTED NOT DETECTED Final   Staphylococcus aureus (BCID) NOT DETECTED NOT DETECTED Final   Staphylococcus epidermidis NOT DETECTED NOT DETECTED Final   Staphylococcus lugdunensis NOT DETECTED NOT DETECTED Final   Streptococcus species NOT DETECTED NOT DETECTED Final   Streptococcus agalactiae NOT DETECTED NOT DETECTED Final   Streptococcus pneumoniae NOT DETECTED NOT DETECTED Final   Streptococcus pyogenes NOT DETECTED NOT DETECTED Final   A.calcoaceticus-baumannii NOT DETECTED NOT DETECTED Final   Bacteroides fragilis NOT DETECTED NOT DETECTED Final   Enterobacterales NOT DETECTED NOT DETECTED Final   Enterobacter cloacae complex NOT DETECTED NOT DETECTED Final   Escherichia coli NOT DETECTED NOT DETECTED Final   Klebsiella aerogenes NOT DETECTED NOT DETECTED Final   Klebsiella oxytoca NOT DETECTED NOT DETECTED Final   Klebsiella pneumoniae NOT DETECTED NOT DETECTED Final   Proteus species NOT DETECTED NOT DETECTED Final   Salmonella species NOT DETECTED NOT DETECTED Final   Serratia marcescens NOT  DETECTED NOT DETECTED Final   Haemophilus influenzae NOT DETECTED NOT DETECTED Final   Neisseria meningitidis NOT DETECTED NOT DETECTED Final   Pseudomonas aeruginosa NOT DETECTED NOT DETECTED Final   Stenotrophomonas maltophilia NOT DETECTED NOT DETECTED Final   Candida albicans NOT DETECTED NOT DETECTED Final   Candida auris NOT DETECTED NOT DETECTED Final   Candida glabrata NOT DETECTED NOT DETECTED Final   Candida krusei NOT DETECTED NOT DETECTED Final   Candida parapsilosis NOT DETECTED NOT DETECTED Final   Candida tropicalis NOT DETECTED NOT DETECTED Final   Cryptococcus neoformans/gattii NOT DETECTED NOT DETECTED  Final    Comment: Performed at [facility], [address] [place], [place] [address]  Blood culture (single)     Status: None (Preliminary result)   Collection Time: 06/10/23 12:53 AM   Specimen: BLOOD  Result Value Ref Range Status   Specimen Description BLOOD RIGHT ARM  Final   Special Requests   Final    BOTTLES DRAWN AEROBIC AND ANAEROBIC Blood Culture results may not be optimal due to an inadequate volume of blood received in culture bottles   Culture   Final    NO GROWTH 3 DAYS Performed at [facility], [address] [place], [place] [address]    Report Status PENDING  Incomplete  C Difficile Quick Screen w PCR reflex     Status: None   Collection Time: 06/11/23  9:28 PM   Specimen: STOOL  Result Value Ref Range Status   C Diff antigen NEGATIVE NEGATIVE Final   C Diff toxin NEGATIVE NEGATIVE Final   C Diff interpretation No C. difficile detected.  Final    Comment: Performed at [facility], [address] [place], [place] [address]      Radiology Studies last 3 days: MR LUMBAR SPINE WO CONTRAST Result Date: 06/12/2023 CLINICAL DATA:  Initial evaluation for possible osteomyelitis. History of bacteremia, back pain. EXAM: MRI LUMBAR SPINE WITHOUT CONTRAST TECHNIQUE: Multiplanar, multisequence MR imaging of the lumbar spine was performed. No intravenous contrast was administered. COMPARISON:  Comparison made with prior CT from 06/10/2023. FINDINGS: Segmentation: Standard. Lowest well-formed disc space labeled the L5-S1 level. Alignment: Trace 2 mm anterolisthesis of L4 on L5. Alignment otherwise normal with preservation of the normal lumbar lordosis. Vertebrae: Complete obliteration of the L5-S1 interspace with bony ankylosis the L5 and S1 vertebral bodies. No associated abnormal marrow edema at this level. No worrisome or aggressive osseous lesions seen at this location on prior CT. Finding is nonspecific, and could be related to prior surgery or  possibly remote infection. Again, no associated marrow edema to suggest acute infection at this time. No other evidence for acute infection elsewhere within the lumbar spine. Vertebral body height maintained. Bone marrow signal intensity within normal limits. Few scattered benign hemangiomata noted. No worrisome osseous lesions. Conus medullaris and cauda equina: Conus extends to the L1 level. Conus and cauda equina appear normal. No epidural collections. Paraspinal and other soft tissues: Paraspinous soft tissues demonstrate no acute finding. Multifocal cortical scarring noted about the visualized kidneys. Disc levels: L1-2: Disc desiccation with mild diffuse disc bulge. Mild reactive endplate spurring. Mild bilateral facet hypertrophy. No spinal stenosis. Foramina remain patent. L2-3: Disc desiccation with mild disc bulge. Mild reactive endplate spurring. Mild to moderate facet and ligament flavum hypertrophy. No significant spinal stenosis. Mild left greater than right L2 foraminal stenosis. L3-4: Disc desiccation with mild disc bulge. Moderate bilateral facet hypertrophy. No more than mild spinal stenosis. Mild to  moderate left with mild right L3 foraminal narrowing. L4-5: Trace anterolisthesis. Disc desiccation with mild disc bulge. Severe bilateral facet arthrosis. Resultant moderate spinal stenosis. Mild bilateral L4 foraminal narrowing. 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 Associated mild endplate spurring, slightly asymmetric to the right. Prior right hemi laminectomy. Mild to moderate left greater than right facet hypertrophy. No spinal stenosis. Mild to moderate bilateral L5 foraminal narrowing. IMPRESSION: 1. No MRI evidence for acute infection within the lumbar spine. 2. 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 Finding is nonspecific, and could be related to prior surgery or  possibly remotely healed infection. 3. Multifactorial degenerative changes at L4-5 with resultant moderate spinal stenosis. Mild to moderate bilateral L2 through L5 foraminal narrowing as above. Electronically Signed   By: Morene Hoard M.D.   On: 06/12/2023 22:38   MR THORACIC SPINE WO CONTRAST Result Date: 06/12/2023 CLINICAL DATA:  Initial evaluation for possible osteomyelitis, bacteremia with back pain. EXAM: MRI THORACIC SPINE WITHOUT CONTRAST TECHNIQUE: Multiplanar, multisequence MR imaging of the thoracic spine was performed. No intravenous contrast was administered. COMPARISON:  Comparison made with prior CT from 06/10/2023. FINDINGS: Alignment: Mild exaggeration of the normal thoracic kyphosis. Trace stepwise degenerative anterolisthesis of T1 on T2 through T3 on T4. Vertebrae: Vertebral body height maintained without acute or chronic fracture. Bone marrow signal intensity within normal limits. Subcentimeter benign hemangioma noted within the T6 vertebral body. No worrisome osseous lesions. No findings to suggest osteomyelitis discitis or septic arthritis. Cord:  Normal signal and morphology.  No epidural collections. Paraspinal and other soft tissues: Paraspinous soft tissues demonstrate no acute finding. Moderate to large layering bilateral pleural effusions noted. Disc levels: Or narrowing for age multilevel disc desiccation with mild degenerative endplate spurring noted throughout the thoracic spine. No significant disc bulge or focal disc herniation. No spinal stenosis. Foramina remain patent. No neural impingement. IMPRESSION: 1. No MRI evidence for infection within the thoracic spine. 2. Ordinary for age multilevel thoracic spondylosis without significant stenosis or neural impingement. 3. Moderate to large layering bilateral pleural effusions. Electronically Signed   By: Morene Hoard M.D.   On: 06/12/2023 22:24   ECHO TEE Result Date: 06/11/2023    TRANSESOPHOGEAL ECHO REPORT    Patient Name:   Tina Peters Date of Exam: 06/11/2023 Medical Rec #:  [id number]       Height:       65.0 in Accession #:    [id number]      Weight:       224.9 lb Date of Birth:  [birth year]       BSA:          2.079 m Patient Age:    72 years        BP:           95/59 mmHg Patient Gender: F               HR:           67 bpm. 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 Risk Factors:Hypertension. S/P Bovine mitral                  valve replacement.                   Mitral Valve: bioprosthetic valve valve is present in the                  mitral position.  Sonographer:     Christopher Furnace Referring Phys:  [id number] CHRISTOPHER RONALD BERGE Diagnosing Phys: Evalene Lunger MD PROCEDURE: After discussion of the risks and benefits of a TEE, an informed consent was obtained from the patient. TEE procedure time was 30 minutes. The transesophogeal probe was passed without difficulty through the esophogus of the patient. Local oropharyngeal anesthetic was provided with Cetacaine  and viscous lidocaine . Sedation performed by different physician. Image quality was excellent. The patient's vital signs; including heart rate, blood pressure, and oxygen saturation; remained stable throughout the procedure. The patient developed no complications during the procedure.  IMPRESSIONS  1. Left ventricular ejection fraction, by estimation, is 55 to 60%. The left ventricle has normal function. The left ventricle has no regional wall motion abnormalities.  2. Right ventricular systolic function is mildly reduced. The right ventricular size is normal.  3. Left atrial size was severely dilated. No left atrial/left atrial appendage thrombus was detected.  4. Right atrial size was mildly  dilated.  5. The mitral valve has been repaired/replaced. No evidence of mitral valve regurgitation. Mild mitral stenosis. The mean mitral valve gradient is 7.0 mmHg. There is a bioprosthetic valve present in the mitral position. Echo findings are consistent with  vegetation of the posterior mitral prosthesis, with significant thickening and a small mobile opacity coming from the posterior leaflet.  6. Tricuspid valve regurgitation is moderate.  7. The aortic valve is normal in structure. There is mild calcification of the aortic valve. Aortic valve regurgitation is not visualized. Aortic valve sclerosis is present, with no evidence of aortic valve stenosis.  8. The inferior vena cava is normal in size with greater than 50% respiratory variability, suggesting right atrial pressure of 3 mmHg.  9. Agitated saline contrast bubble study was negative, with no evidence of any interatrial shunt. Conclusion(s)/Recommendation(s): Thickening of MV posterior leflet with mobile vegetation noted FINDINGS  Left Ventricle: Left ventricular ejection fraction, by estimation, is 55 to 60%. The left ventricle has normal function. The left ventricle has no regional wall motion abnormalities. The left ventricular internal cavity size was normal in size. There is  no left ventricular hypertrophy. Right Ventricle: The right ventricular size is normal. No increase in right ventricular wall thickness. Right ventricular systolic function is mildly reduced. Left Atrium: Left atrial size was severely dilated. No left atrial/left atrial appendage thrombus was detected. Right Atrium: Right atrial size was mildly dilated. Pericardium: There is no evidence of pericardial effusion. Mitral Valve: The mitral valve has been repaired/replaced. There is severe thickening of the mitral valve leaflet(s). No evidence of mitral valve regurgitation. There is a bioprosthetic valve present in the mitral position. Echo findings are consistent with vegetation on  the mitral prosthesis. Mild mitral valve stenosis. MV peak gradient, 22.3 mmHg. The mean mitral valve gradient is 7.0 mmHg. Tricuspid Valve: The tricuspid valve is normal in structure. Tricuspid valve regurgitation is moderate . No evidence of tricuspid stenosis. Aortic Valve: The aortic valve is normal in structure. There is mild calcification of the aortic valve. Aortic valve regurgitation is not visualized. Aortic valve  sclerosis is present, with no evidence of aortic valve stenosis. Pulmonic Valve: The pulmonic valve was normal in structure. Pulmonic valve regurgitation is not visualized. No evidence of pulmonic stenosis. Aorta: The aortic root is normal in size and structure. Venous: The inferior vena cava is normal in size with greater than 50% respiratory variability, suggesting right atrial pressure of 3 mmHg. IAS/Shunts: No atrial level shunt detected by color flow Doppler. Agitated saline contrast was given intravenously to evaluate for intracardiac shunting. Agitated saline contrast bubble study was negative, with no evidence of any interatrial shunt. There  is no evidence of a patent foramen ovale. There is no evidence of an atrial septal defect.  MITRAL VALVE MV Area (PHT): 1.46 cm MV Peak grad:  22.3 mmHg MV Mean grad:  7.0 mmHg MV Vmax:       2.36 m/s MV Vmean:      107.0 cm/s MV Decel Time: 518 msec MV E velocity: 198.00 cm/s MV A velocity: 41.20 cm/s MV E/A ratio:  4.81 Evalene Lunger MD Electronically signed by Evalene Lunger MD Signature Date/Time: 06/11/2023/4:44:40 PM    Final    ECHOCARDIOGRAM COMPLETE Result Date: 06/10/2023    ECHOCARDIOGRAM REPORT   Patient Name:   Tina Peters Date of Exam: 06/10/2023 Medical Rec #:  [id number]       Height:       65.0 in Accession #:    [id number]      Weight:       224.9 lb Date of Birth:  [birth year]       BSA:          2.079 m Patient Age:    72 years        BP:           97/55 mmHg Patient Gender: F               HR:           72 bpm. Exam Location:   [facility] Procedure: 2D Echo, Cardiac Doppler and Color Doppler Indications:     Chest pain R07.9  History:         Patient has prior history of Echocardiogram examinations, most                  recent 07/24/2022. Risk Factors:Hypertension and Dyslipidemia.                  S/P Bovine mitral valve replacement.  Sonographer:     Christopher Furnace Referring Phys:  [id number] CHRISTOPHER END Diagnosing Phys: Lonni Hanson MD IMPRESSIONS  1. Left ventricular ejection fraction, by estimation, is 50 to 55%. The left ventricle has low normal function. The left ventricle has no regional wall motion abnormalities. Left ventricular diastolic function could not be evaluated.  2. Right ventricular systolic function is mildly reduced. The right ventricular size is mildly enlarged.  3. Left atrial size was severely dilated.  4. Right atrial size was mildly dilated.  5. The mitral valve has been repaired/replaced. No evidence of mitral valve regurgitation. Moderate to severe mitral stenosis. The mean mitral valve gradient is 11.0 mmHg.  6. Tricuspid valve regurgitation is mild to moderate.  7. The aortic valve is tricuspid. There is mild thickening of the aortic valve. Aortic valve regurgitation is not visualized. Aortic valve sclerosis/calcification is present, without any evidence of aortic stenosis. Conclusion(s)/Recommendation(s): Mitral valve prosthesis appears thickened with increased mean gradient since last echo shortly after implantation in 10/2021 at [facility] (mean gratient  5 -> 11 mmHg). Endocarditis and/or pannus formation cannot be excluded, though a mobile vegetation is not clearly seen. Consider further evaluation by transesophageal echocardiogram. FINDINGS  Left Ventricle: Left ventricular ejection fraction, by estimation, is 50 to 55%. The left ventricle has low normal function. The left ventricle has no regional wall motion abnormalities. The left ventricular internal cavity size was normal in size. There is borderline left  ventricular hypertrophy. Left ventricular diastolic function could not be evaluated due to mitral valve replacement. Left ventricular diastolic function could not be evaluated. Right Ventricle: The right ventricular size is mildly enlarged. No increase in right ventricular wall thickness. Right ventricular systolic function is mildly reduced. Left Atrium: Left atrial size was severely dilated. Right Atrium: Right atrial size was mildly dilated. Pericardium: There is no evidence of pericardial effusion. Mitral Valve: The mitral valve has been repaired/replaced. No evidence of mitral valve regurgitation. There is a 27 mm Mitris bioprosthetic valve present in the mitral position. Moderate to severe mitral valve stenosis. MV peak gradient, 25.8 mmHg. The mean mitral valve gradient is 11.0 mmHg. Tricuspid Valve: The tricuspid valve is normal in structure. Tricuspid valve regurgitation is mild to moderate. Aortic Valve: The aortic valve is tricuspid. There is mild thickening of the aortic valve. There is mild aortic valve annular calcification. Aortic valve regurgitation is not visualized. Aortic valve sclerosis/calcification is present, without any evidence of aortic stenosis. Aortic valve mean gradient measures 5.0 mmHg. Aortic valve peak gradient measures 10.1 mmHg. Aortic valve area, by VTI measures 1.53 cm. Pulmonic Valve: The pulmonic valve was normal in structure. Pulmonic valve regurgitation is mild to moderate. No evidence of pulmonic stenosis. Aorta: The aortic root is normal in size and structure. Pulmonary Artery: The pulmonary artery is of normal size. IAS/Shunts: No atrial level shunt detected by color flow Doppler.  LEFT VENTRICLE PLAX 2D LVIDd:         5.00 cm LVIDs:         3.20 cm LV PW:         1.10 cm LV IVS:        1.00 cm LVOT diam:     2.00 cm LV SV:         45 LV SV Index:   22 LVOT Area:     3.14 cm  RIGHT VENTRICLE RV Basal diam:  4.30 cm RV Mid diam:    3.60 cm RV S prime:     11.00 cm/s TAPSE  (M-mode): 2.0 cm LEFT ATRIUM              Index        RIGHT ATRIUM           Index LA diam:        4.60 cm  2.21 cm/m   RA Area:     23.30 cm LA Vol (A2C):   107.0 ml 51.46 ml/m  RA Volume:   73.70 ml  35.45 ml/m LA Vol (A4C):   119.0 ml 57.23 ml/m LA Biplane Vol: 114.0 ml 54.83 ml/m  AORTIC VALVE                     PULMONIC VALVE AV Area (Vmax):    1.55 cm      PR End Diast Vel: 11.16 msec AV Area (Vmean):   1.62 cm AV Area (VTI):     1.53 cm AV Vmax:           158.67 cm/s AV Vmean:  103.633 cm/s AV VTI:            0.293 m AV Peak Grad:      10.1 mmHg AV Mean Grad:      5.0 mmHg LVOT Vmax:         78.20 cm/s LVOT Vmean:        53.300 cm/s LVOT VTI:          0.143 m LVOT/AV VTI ratio: 0.49  AORTA Ao Root diam: 3.00 cm MITRAL VALVE                TRICUSPID VALVE MV Area (PHT): 1.26 cm     TR Peak grad:   35.5 mmHg MV Area VTI:   0.52 cm     TR Vmax:        298.00 cm/s MV Peak grad:  25.8 mmHg MV Mean grad:  11.0 mmHg    SHUNTS MV Vmax:       2.54 m/s     Systemic VTI:  0.14 m MV Vmean:      152.0 cm/s   Systemic Diam: 2.00 cm MV Decel Time: 604 msec MV E velocity: 188.00 cm/s Lonni Hanson MD Electronically signed by Lonni Hanson MD Signature Date/Time: 06/10/2023/10:36:59 AM    Final    US  ABDOMEN LIMITED RUQ (LIVER/GB) Result Date: 06/10/2023 CLINICAL DATA:  [id number]. Gallstones on CT today. Patient undergoing workup for sepsis. EXAM: ULTRASOUND ABDOMEN LIMITED RIGHT UPPER QUADRANT COMPARISON:  CT earlier today. FINDINGS: Gallbladder: There is slight gallbladder dilatation. Multiple layering subcentimeter stones are noted in the proximal lumen intermixed with sludge. There is no free wall thickening or positive sonographic Murphy's sign, no pericholecystic fluid. Common bile duct: Diameter: 1.4 mm with no intrahepatic bile duct dilatation. Liver: No focal lesion identified. There is mild increased hepatic echogenicity consistent with steatosis. Portal vein is patent on color Doppler imaging  with normal direction of blood flow towards the liver. Other: None. IMPRESSION: 1. Cholelithiasis and gallbladder sludge with slight gallbladder dilatation. No free wall thickening or positive sonographic Murphy's sign. 2. No biliary ductal dilatation. 3. Mild hepatic steatosis. Electronically Signed   By: Francis Quam M.D.   On: 06/10/2023 03:02   CT CHEST ABDOMEN PELVIS W CONTRAST Result Date: 06/10/2023 CLINICAL DATA:  Sepsis EXAM: CT CHEST, ABDOMEN, AND PELVIS WITH CONTRAST TECHNIQUE: Multidetector CT imaging of the chest, abdomen and pelvis was performed following the standard protocol during bolus administration of intravenous contrast. RADIATION DOSE REDUCTION: This exam was performed according to the departmental dose-optimization program which includes automated exposure control, adjustment of the mA and/or kV according to patient size and/or use of iterative reconstruction technique. CONTRAST:  100mL OMNIPAQUE  IOHEXOL  300 MG/ML  SOLN COMPARISON:  None Available. FINDINGS: CT CHEST FINDINGS Cardiovascular: Mild cardiomegaly. Aortic valve replacement. Mild calcific aortic atherosclerosis. No pericardial effusion. Mediastinum/Nodes: No enlarged mediastinal, hilar, or axillary lymph nodes. Thyroid  gland, trachea, and esophagus demonstrate no significant findings. Lungs/Pleura: Mild left basilar atelectasis. Otherwise unremarkable. Musculoskeletal: No chest wall mass or suspicious bone lesions identified. CT ABDOMEN PELVIS FINDINGS Hepatobiliary: Normal hepatic contours. No intra- or extrahepatic biliary dilatation. There is cholelithiasis without acute inflammation. Pancreas: Normal pancreas. No ductal dilatation or peripancreatic fluid collection. Spleen: Normal. Adrenals/Urinary Tract: The adrenal glands are normal. No hydronephrosis, nephroureterolithiasis or solid renal mass. The urinary bladder is normal for degree of distention Stomach/Bowel: There is no hiatal hernia. Normal duodenal course and  caliber. No small bowel dilatation or inflammation. Rectosigmoid diverticulosis without acute inflammation. Normal appendix.  Vascular/Lymphatic: There is calcific atherosclerosis of the abdominal aorta. No lymphadenopathy. Reproductive: Normal prostate size with symmetric seminal vesicles. Other: None. Musculoskeletal: No bony spinal canal stenosis or focal osseous abnormality. IMPRESSION: 1. No acute abnormality of the chest, abdomen or pelvis. 2. Cholelithiasis without acute inflammation 3. Rectosigmoid diverticulosis without acute inflammation. Electronically Signed   By: Franky Stanford M.D.   On: 06/10/2023 00:54        Kayelee Herbig, DO [facility] 06/13/2023, 3:21 PM    Dictation software may have been used to generate the above note. Typos may occur and escape review in typed/dictated notes. Please contact Dr Marsa directly for clarity if needed.  Staff may message me via secure chat in Epic  but this may not receive an immediate response,  please page me for urgent matters!  If 7PM-7AM, please contact night coverage [email or web address]

## 2023-06-13 NOTE — Progress Notes (Signed)
 Progress Note  Patient Name: Tina Peters Date of Encounter: 06/13/2023  Primary Cardiologist: Darron  Subjective   Developed Afib around 21:00 on 2/5, remains in Afib with ventricular rates largely in the low 100s bpm with episodes into the 120s bpm, overall a slight uptrend in ventricular rates over the past 24 hours. No chest pain, dyspnea, or palpitations.  Reports diarrhea is improving.  Hgb low, though stable at 9.1. BP sable.   Inpatient Medications    Scheduled Meds:  acetaminophen   650 mg Oral Q6H   amiodarone   400 mg Oral BID   aspirin  EC  81 mg Oral Daily   metoprolol  tartrate  25 mg Oral BID   sertraline   50 mg Oral Daily   Continuous Infusions:  cefTRIAXone  (ROCEPHIN )  IV Stopped (06/13/23 0106)   heparin  2,100 Units/hr (06/13/23 0358)   metronidazole  500 mg (06/12/23 2140)   PRN Meds: cyclobenzaprine , fentaNYL  (SUBLIMAZE ) injection, ondansetron  **OR** ondansetron  (ZOFRAN ) IV, traMADol    Vital Signs    Vitals:   06/12/23 1554 06/12/23 1952 06/12/23 2323 06/13/23 0406  BP: 121/85 129/85 131/75 115/82  Pulse: (!) 51 71 (!) 104 70  Resp: 18 19 19 19   Temp: 98.6 F (37 C) 98.5 F (36.9 C) 98.7 F (37.1 C) 98.5 F (36.9 C)  TempSrc:  Oral Oral Oral  SpO2: (!) 88% 94% 96% 91%  Weight:      Height:        Intake/Output Summary (Last 24 hours) at 06/13/2023 0902 Last data filed at 06/13/2023 0841 Gross per 24 hour  Intake 372.02 ml  Output 300 ml  Net 72.02 ml   Filed Weights   06/09/23 2135 06/11/23 1205  Weight: 102 kg 102 kg    Telemetry    Developed Afib at 21:00 on 2/5 with ventricular rates in the low 100s bpm predominantly, with brief episodes into the 120s to 140s bpm - Personally Reviewed  ECG    No new tracings - Personally Reviewed  Physical Exam   GEN: No acute distress.   Neck: No JVD. Cardiac: Mildly tachycardic, IRIR, II/VI systolic murmur LSB, no rubs, or gallops.  Respiratory: Clear to auscultation bilaterally.  GI: Soft,  nontender, non-distended.   MS: No edema; No deformity. Neuro:  Alert and oriented x 3; Nonfocal.  Psych: Normal affect.  Labs    Chemistry Recent Labs  Lab 06/09/23 2153 06/10/23 1531 06/11/23 0635  NA 133* 137 135  K 2.8* 3.7 3.6  CL 97* 103 106  CO2 20* 24 22  GLUCOSE 139* 130* 99  BUN 17 15 13   CREATININE 1.14* 1.12* 1.02*  CALCIUM  8.1* 7.9* 7.8*  PROT 6.8  --  5.7*  ALBUMIN 2.9*  --  2.4*  AST 53*  --  27  ALT 27  --  19  ALKPHOS 107  --  77  BILITOT 1.8*  --  1.0  GFRNONAA 51* 52* 58*  ANIONGAP 16* 10 7     Hematology Recent Labs  Lab 06/11/23 0635 06/12/23 0723 06/13/23 0206  WBC 16.8* 12.0* 14.7*  RBC 3.27* 3.41* 3.83*  HGB 8.5* 9.1* 10.2*  HCT 27.5* 28.5* 33.0*  MCV 84.1 83.6 86.2  MCH 26.0 26.7 26.6  MCHC 30.9 31.9 30.9  RDW 16.1* 16.4* 16.8*  PLT 160 160 192    Cardiac EnzymesNo results for input(s): TROPONINI in the last 168 hours. No results for input(s): TROPIPOC in the last 168 hours.   BNP Recent Labs  Lab 06/09/23 2153  BNP 720.2*     DDimer No results for input(s): DDIMER in the last 168 hours.   Radiology       Cardiac Studies   2D echo 06/10/2023: 1. Left ventricular ejection fraction, by estimation, is 50 to 55%. The  left ventricle has low normal function. The left ventricle has no regional  wall motion abnormalities. Left ventricular diastolic function could not  be evaluated.   2. Right ventricular systolic function is mildly reduced. The right  ventricular size is mildly enlarged.   3. Left atrial size was severely dilated.   4. Right atrial size was mildly dilated.   5. The mitral valve has been repaired/replaced. No evidence of mitral  valve regurgitation. Moderate to severe mitral stenosis. The mean mitral  valve gradient is 11.0 mmHg.   6. Tricuspid valve regurgitation is mild to moderate.   7. The aortic valve is tricuspid. There is mild thickening of the aortic  valve. Aortic valve regurgitation is not  visualized. Aortic valve  sclerosis/calcification is present, without any evidence of aortic  stenosis.   Conclusion(s)/Recommendation(s): Mitral valve prosthesis appears thickened  with increased mean gradient since last echo shortly after implantation in  10/2021 at [facility] (mean gratient 5 -> 11 mmHg). Endocarditis and/or pannus  formation cannot be excluded,  though a mobile vegetation is not clearly seen. Consider further  evaluation by transesophageal echocardiogram.  ________  TEE 06/11/2023: 1. Left ventricular ejection fraction, by estimation, is 55 to 60%. The  left ventricle has normal function. The left ventricle has no regional  wall motion abnormalities.   2. Right ventricular systolic function is mildly reduced. The right  ventricular size is normal.   3. Left atrial size was severely dilated. No left atrial/left atrial  appendage thrombus was detected.   4. Right atrial size was mildly dilated.   5. The mitral valve has been repaired/replaced. No evidence of mitral  valve regurgitation. Mild mitral stenosis. The mean mitral valve gradient  is 7.0 mmHg. There is a bioprosthetic valve present in the mitral  position. Echo findings are consistent with   vegetation of the posterior mitral prosthesis, with significant  thickening and a small mobile opacity coming from the posterior leaflet.   6. Tricuspid valve regurgitation is moderate.   7. The aortic valve is normal in structure. There is mild calcification  of the aortic valve. Aortic valve regurgitation is not visualized. Aortic  valve sclerosis is present, with no evidence of aortic valve stenosis.   8. The inferior vena cava is normal in size with greater than 50%  respiratory variability, suggesting right atrial pressure of 3 mmHg.   9. Agitated saline contrast bubble study was negative, with no evidence  of any interatrial shunt.   Conclusion(s)/Recommendation(s): Thickening of MV posterior leflet with  mobile  vegetation noted   Patient Profile     73 y.o. 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  Assessment & Plan    1.  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  PAF: -Presented in sinus rhythm with development of A-fib at 21:00 on 06/11/2023, remains in A-fib with ventricular rates largely in the low 100s bpm with brief episodes into the 120s to 140s bpm -Asymptomatic -Reload with amiodarone  400 mg twice daily -For added ventricular rate control, transition from Toprol  XL 25 mg daily to Lopressor  at titrated dose of 25 mg bid -Continue heparin  infusion with recommendation to transition to back to PTA rivaroxaban  prior to discharge  3.  Elevated troponin: -Suspected to be supply/demand ischemia in the setting of bacteremia -Echo this admission with preserved LV systolic function and no focal wall motion  abnormality -Continue heparin , aspirin , and beta-blocker  4.  Anemia: -Hemoglobin improved to 10.2 from 9.1, remains down from a baseline around 12 -Denies symptoms concerning for bleeding -Monitor on heparin  drip  5.  Hypokalemia: -Likely from GI loss -Improving       For questions or updates, please contact [facility] Please consult [email or web address] for contact info under Cardiology/STEMI.    Signed, Bernardino Bring, PA-C [facility] Pager: [phone] 06/13/2023, 9:02 AM

## 2023-06-13 NOTE — Progress Notes (Signed)
 ANTICOAGULATION CONSULT NOTE  Pharmacy Consult for heparin  infusion Indication: afib  No Known Allergies  Patient Measurements: Height: 5' 5 (165.1 cm) Weight: 102 kg (224 lb 13.9 oz) IBW/kg (Calculated) : 57 Heparin  Dosing Weight: 80.5 kg  Vital Signs: Temp: 98.7 F (37.1 C) (02/06 2323) Temp Source: Oral (02/06 2323) BP: 131/75 (02/06 2323) Pulse Rate: 104 (02/06 2323)  Labs: Recent Labs    06/10/23 0359 06/10/23 0731 06/10/23 1242 06/10/23 1531 06/10/23 2253 06/11/23 0635 06/11/23 1123 06/11/23 2008 06/12/23 0723 06/12/23 1557 06/13/23 0206  HGB  --   --    < > 9.4*  --  8.5*  --   --  9.1*  --  10.2*  HCT  --   --    < > 29.7*  --  27.5*  --   --  28.5*  --  33.0*  PLT  --   --    < > 178  --  160  --   --  160  --  192  APTT 37*  --    < >  --    < >  --  78* 55* 90*  --   --   LABPROT 18.9*  --   --   --   --   --   --   --   --   --   --   INR 1.6*  --   --   --   --   --   --   --   --   --   --   HEPARINUNFRC >1.10*  --   --   --   --   --   --   --  0.36 <0.10* >0.10*  CREATININE  --   --   --  1.12*  --  1.02*  --   --   --   --   --   TROPONINIHS  --  362*  --  228*  --   --   --   --   --   --   --    < > = values in this interval not displayed.    Estimated Creatinine Clearance: 59 mL/min (A) (by C-G formula based on SCr of 1.02 mg/dL (H)).   Medical History: Past Medical History:  Diagnosis Date   Allergy    Anxiety    Arthritis    Asthma    Chronic back pain    Chronic combined systolic (congestive) and diastolic (congestive) heart failure (HCC)    a. 02/2020 Echo: EF 60-65%, no rwma. Gr2 DD. Nl RV size/fxn. Mild LAE. Mild MR; b. 10/2021 Echo: [facility] following MVR) EF 40 to 45%.   Depression    Diverticulitis    Endocarditis    a. 10/2021 S sanguinous bacteremia w/ MV veg-->73mm Mitris bioprosthetic valve, Maze, and LAA clip.   Hyperlipidemia    Hypertension    Lipoma    Mild mitral regurgitation    a. 08/2016 Echo: mild MR; b. 02/2020  Echo: Mild MR.   Neuromuscular disorder (HCC)    Obesity    Obstructive sleep apnea    PAF (paroxysmal atrial fibrillation) (HCC)    a. Dx 05/2016--> Xarelto  (CHA2DS2VASc = 3); b. 08/2016 Echo: EF 55-60%, no rwma, mild MR, mod dil LA; c. 03/2020 Zio: Avg HR 61 w/ Afib burden of 24% - max rate 189 (avg 93). 2 pauses - longest 4.7 secs post-conversion pauses (occurred @ 5:42 am); d. 04/2022 s/p  DCCV; e. 08/2022 s/p DCCV.   Paroxysmal SVT (supraventricular tachycardia) (HCC)    S/P mitral valve replacement    a. 10/2021 [facility] - Endocarditis/MV Veg-->67mm Mitris bioprosthetic valve, Maze, and LAA clip.   Sleep apnea    Stroke [facility])    a. 02/2020 MRI/A: multifocal acute ischemia in R MCA territory predominantly involving post insula and R middle frontal gyrus. Nl MRA.   Tachy-brady syndrome (HCC)    Tobacco abuse    Medications:  PTA Meds: Rivaroxaban  20 mg qpm, last dose unknown  Assessment: Pt is a 73 yo female presenting to ED c/o diarrhea x6 days and vomiting. Reports feeling of heart fluttering and pain that started x2 days ago, found with elevated BNP and Troponin I level trending up. Pt has hx of afib and is on rivaroxaban . CHADSVASc 6. Now found to have endocarditis with hx of mitral bioprosthetic valve.   Goal of Therapy:  Heparin  level 0.3-0.7 units/ml Monitor platelets by anticoagulation protocol: Yes  Monitoring: 2/4: aPTT @ 1242 was 39 Subtherapeutic 2/4 2253 aPTT 54, subtherapeutic 2/5 1123 aPTT 78 2/5 2008 aPTT 55  SUBtherapeutic 2/6 0723 aPTT 90 HL 0.36 2/7 0206 HL < 0.10 subtherapeutic (confirmed level with lab)  No issues with infusion reported, no signs/symptoms of bleeding noted.  Plan: Bolus 2400 units x 1 Increase heparin  infusion rate to 2100 units/hr Recheck HL in 8 hrs after rate change CBC daily while on heparin .    Thank you for involving pharmacy in this patient's care.   Rankin CANDIE Dills, PharmD, [facility] 06/13/2023 3:46 AM

## 2023-06-13 NOTE — Plan of Care (Signed)

## 2023-06-13 NOTE — Evaluation (Signed)
 Physical Therapy Evaluation Patient Details Name: Tina Peters MRN: [id number] DOB: 10-Mar-1951 Today's Date: 06/13/2023  History of Present Illness  73 y/o female presented to ED on 06/09/23 for diarrhea x 6 days and vomitting along with heart fluttering and pain x 2 days. Admitted for acute gastroenteritis and sepsis. PMH: CHF, HTN, Afib on anticoagulation, hx of CVA, OSA, CKD stage IIIa  Clinical Impression  Patient admitted with the above. PTA, patient lives alone and reports independence with no AD at baseline. Patient currently requires CGA for sit to stand and ambulation with RW within room. HR up to 143 during activity with spO2 93%. Complaining of dizziness throughout. Patient will benefit from skilled PT services during acute stay to address listed deficits. Patient will benefit from ongoing therapy at discharge to maximize functional independence and safety.         If plan is discharge home, recommend the following: A little help with walking and/or transfers;A little help with bathing/dressing/bathroom;Assistance with cooking/housework;Assist for transportation;Help with stairs or ramp for entrance   Can travel by private vehicle        Equipment Recommendations Rolling Auden Tatar (2 wheels)  Recommendations for Other Services       Functional Status Assessment Patient has had a recent decline in their functional status and demonstrates the ability to make significant improvements in function in a reasonable and predictable amount of time.     Precautions / Restrictions Precautions Precautions: None Restrictions Weight Bearing Restrictions Per Provider Order: No      Mobility  Bed Mobility               General bed mobility comments: not tested    Transfers Overall transfer level: Needs assistance Equipment used: None Transfers: Sit to/from Stand Sit to Stand: Contact guard assist                Ambulation/Gait Ambulation/Gait assistance: Contact  guard assist Gait Distance (Feet): 25 Feet Assistive device: Rolling Quantavius Humm (2 wheels) Gait Pattern/deviations: Step-through pattern, Decreased stride length Gait velocity: decreased     General Gait Details: CGA for safety. No overt LOB noted. Complaining of dizziness  Stairs            Wheelchair Mobility     Tilt Bed    Modified Rankin (Stroke Patients Only)       Balance Overall balance assessment: Needs assistance Sitting-balance support: No upper extremity supported Sitting balance-Leahy Scale: Good     Standing balance support: No upper extremity supported, During functional activity Standing balance-Leahy Scale: Fair                               Pertinent Vitals/Pain      Home Living Family/patient expects to be discharged to:: Private residence Living Arrangements: Alone Available Help at Discharge: Family Type of Home: House Home Access: Stairs to enter Entrance Stairs-Rails: Can reach both Entrance Stairs-Number of Steps: 3+1   Home Layout: Two level;Able to live on main level with bedroom/bathroom Home Equipment: Rolling Etter Royall (2 wheels);Cane - single point      Prior Function Prior Level of Function : Independent/Modified Independent;Driving             Mobility Comments: no AD use ADLs Comments: pt makes homemade candy     Extremity/Trunk Assessment   Upper Extremity Assessment Upper Extremity Assessment: Overall WFL for tasks assessed    Lower Extremity Assessment Lower Extremity Assessment: Generalized  weakness       Communication   Communication Communication: No apparent difficulties  Cognition Arousal: Alert Behavior During Therapy: WFL for tasks assessed/performed Overall Cognitive Status: Within Functional Limits for tasks assessed                                          General Comments General comments (skin integrity, edema, etc.): HR up to 144    Exercises      Assessment/Plan    PT Assessment Patient needs continued PT services  PT Problem List Decreased strength;Decreased activity tolerance;Decreased mobility;Decreased balance;Decreased knowledge of precautions;Decreased knowledge of use of DME       PT Treatment Interventions DME instruction;Gait training;Stair training;Functional mobility training;Therapeutic activities;Therapeutic exercise;Balance training;Patient/family education    PT Goals (Current goals can be found in the Care Plan section)  Acute Rehab PT Goals Patient Stated Goal: to get better and go home PT Goal Formulation: With patient Time For Goal Achievement: 06/27/23 Potential to Achieve Goals: Good    Frequency Min 1X/week     Co-evaluation               AM-PAC PT 6 Clicks Mobility  Outcome Measure Help needed turning from your back to your side while in a flat bed without using bedrails?: A Little Help needed moving from lying on your back to sitting on the side of a flat bed without using bedrails?: A Little Help needed moving to and from a bed to a chair (including a wheelchair)?: A Little Help needed standing up from a chair using your arms (e.g., wheelchair or bedside chair)?: A Little Help needed to walk in hospital room?: A Little Help needed climbing 3-5 steps with a railing? : A Little 6 Click Score: 18    End of Session   Activity Tolerance: Patient tolerated treatment well;Other (comment) (Limited by dizziness) Patient left: in chair;with call bell/phone within reach Nurse Communication: Mobility status PT Visit Diagnosis: Unsteadiness on feet (R26.81);Muscle weakness (generalized) (M62.81);Other abnormalities of gait and mobility (R26.89)    Time: 1130-1146 PT Time Calculation (min) (ACUTE ONLY): 16 min   Charges:   PT Evaluation $PT Eval Moderate Complexity: 1 Mod   PT General Charges $$ ACUTE PT VISIT: 1 Visit         Maryanne Finder, PT, DPT Physical Therapist - [facility]   [facility]   Zipporah Finamore A Tracye Szuch 06/13/2023, 1:07 PM

## 2023-06-13 NOTE — TOC Progression Note (Addendum)
 Transition of Care [facility]) - Progression Note    Patient Details  Name: Tina Peters MRN: [id number] Date of Birth: 06-16-1950  [facility] CM/SW Contact  Shiva Sahagian C Mikhail Hallenbeck, RN Phone Number: 06/13/2023, 3:48 PM  Clinical Narrative:    Spoke with patient regarding discharge regarding therapy's recommendation. Patient is agreeable to [facility] and does not have a choice of agency. She has been advised the accepting agency will contact her within 48 hours of discharge to schedule SOC.   Referral sent and accepted  Dorothe from [place].          Expected Discharge Plan and Services                                               Social Determinants of Health (SDOH) Interventions SDOH Screenings   Food Insecurity: No Food Insecurity (06/10/2023)  Housing: Low Risk  (06/10/2023)  Transportation Needs: No Transportation Needs (06/10/2023)  Utilities: Not At Risk (06/10/2023)  Alcohol Screen: Low Risk  (01/27/2023)  Depression (PHQ2-9): Medium Risk (01/27/2023)  Financial Resource Strain: Medium Risk (01/27/2023)  Physical Activity: Insufficiently Active (01/27/2023)  Social Connections: Socially Isolated (06/10/2023)  Stress: Stress Concern Present (01/27/2023)  Tobacco Use: Medium Risk (06/09/2023)  Health Literacy: Adequate Health Literacy (01/27/2023)    Readmission Risk Interventions     No data to display

## 2023-06-13 NOTE — Progress Notes (Signed)
 Date of Admission:  06/09/2023      ID: Tina Peters is a 73 y.o. female Principal Problem:   Acute gastroenteritis Active Problems:   Essential hypertension   Hypokalemia   PAF (paroxysmal atrial fibrillation) (HCC)   OSA (obstructive sleep apnea)   Chronic anticoagulation   Stage 3a chronic kidney disease (HCC) - Baseline creatinine 1.1-1.4   Acute bacterial endocarditis   HFrEF (heart failure with reduced ejection fraction) (HCC)   Chronic a-fib (HCC)   Bacteremia   Demand ischemia (HCC)   Acute sepsis (HCC)   Obesity, Class II, BMI 35-39.9   Nausea vomiting and diarrhea   S/P MVR (mitral valve repair)   Pt feeling better  Medications:   acetaminophen   650 mg Oral Q6H   amiodarone   400 mg Oral BID   aspirin  EC  81 mg Oral Daily   metoprolol  tartrate  25 mg Oral BID   sertraline   50 mg Oral Daily    Objective: Vital signs in last 24 hours: Patient Vitals for the past 24 hrs:  BP Temp Temp src Pulse Resp SpO2  06/13/23 1801 (!) 108/50 97.9 F (36.6 C) Oral 91 18 97 %  06/13/23 1545 -- -- -- -- -- 99 %  06/13/23 1400 -- -- -- -- -- 99 %  06/13/23 1314 123/65 97.9 F (36.6 C) Oral (!) 49 18 97 %  06/13/23 0903 (!) 143/119 -- -- 66 -- --  06/13/23 0406 115/82 98.5 F (36.9 C) Oral 70 19 91 %  06/12/23 2323 131/75 98.7 F (37.1 C) Oral (!) 104 19 96 %  06/12/23 1952 129/85 98.5 F (36.9 C) Oral 71 19 94 %    Awake and alert Chest b/l air entry [id number] systolic murmur Abd soft CNS non focal  Lab Results    Latest Ref Rng & Units 06/13/2023    2:06 AM 06/12/2023    7:23 AM 06/11/2023    6:35 AM  CBC  WBC 4.0 - 10.5 K/uL 14.7  12.0  16.8   Hemoglobin 12.0 - 15.0 g/dL 89.7  9.1  8.5   Hematocrit 36.0 - 46.0 % 33.0  28.5  27.5   Platelets 150 - 400 K/uL 192  160  160        Latest Ref Rng & Units 06/11/2023    6:35 AM 06/10/2023    3:31 PM 06/09/2023    9:53 PM  CMP  Glucose 70 - 99 mg/dL 99  869  860   BUN 8 - 23 mg/dL 13  15  17    Creatinine 0.44 - 1.00  mg/dL 8.97  8.87  8.85   Sodium 135 - 145 mmol/L 135  137  133   Potassium 3.5 - 5.1 mmol/L 3.6  3.7  2.8   Chloride 98 - 111 mmol/L 106  103  97   CO2 22 - 32 mmol/L 22  24  20    Calcium  8.9 - 10.3 mg/dL 7.8  7.9  8.1   Total Protein 6.5 - 8.1 g/dL 5.7   6.8   Total Bilirubin 0.0 - 1.2 mg/dL 1.0   1.8   Alkaline Phos 38 - 126 U/L 77   107   AST 15 - 41 U/L 27   53   ALT 0 - 44 U/L 19   27       Microbiology: Gram positive cocci bacteremia- Gemella bacteremia  Studies/Results: MR LUMBAR SPINE WO CONTRAST Result Date: 06/12/2023 CLINICAL DATA:  Initial evaluation for  possible osteomyelitis. History of bacteremia, back pain. EXAM: MRI LUMBAR SPINE WITHOUT CONTRAST TECHNIQUE: Multiplanar, multisequence MR imaging of the lumbar spine was performed. No intravenous contrast was administered. COMPARISON:  Comparison made with prior CT from 06/10/2023. FINDINGS: Segmentation: Standard. Lowest well-formed disc space labeled the L5-S1 level. Alignment: Trace 2 mm anterolisthesis of L4 on L5. Alignment otherwise normal with preservation of the normal lumbar lordosis. Vertebrae: Complete obliteration of the L5-S1 interspace with bony ankylosis the L5 and S1 vertebral bodies. No associated abnormal marrow edema at this level. No worrisome or aggressive osseous lesions seen at this location on prior CT. Finding is nonspecific, and could be related to prior surgery or possibly remote infection. Again, no associated marrow edema to suggest acute infection at this time. No other evidence for acute infection elsewhere within the lumbar spine. Vertebral body height maintained. Bone marrow signal intensity within normal limits. Few scattered benign hemangiomata noted. No worrisome osseous lesions. Conus medullaris and cauda equina: Conus extends to the L1 level. Conus and cauda equina appear normal. No epidural collections. Paraspinal and other soft tissues: Paraspinous soft tissues demonstrate no acute finding.  Multifocal cortical scarring noted about the visualized kidneys. Disc levels: L1-2: Disc desiccation with mild diffuse disc bulge. Mild reactive endplate spurring. Mild bilateral facet hypertrophy. No spinal stenosis. Foramina remain patent. L2-3: Disc desiccation with mild disc bulge. Mild reactive endplate spurring. Mild to moderate facet and ligament flavum hypertrophy. No significant spinal stenosis. Mild left greater than right L2 foraminal stenosis. L3-4: Disc desiccation with mild disc bulge. Moderate bilateral facet hypertrophy. No more than mild spinal stenosis. Mild to moderate left with mild right L3 foraminal narrowing. L4-5: Trace anterolisthesis. Disc desiccation with mild disc bulge. Severe bilateral facet arthrosis. Resultant moderate spinal stenosis. Mild bilateral L4 foraminal narrowing. 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 Associated mild endplate spurring, slightly asymmetric to the right. Prior right hemi laminectomy. Mild to moderate left greater than right facet hypertrophy. No spinal stenosis. Mild to moderate bilateral L5 foraminal narrowing. IMPRESSION: 1. No MRI evidence for acute infection within the lumbar spine. 2. 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 Finding is nonspecific, and could be related to prior surgery or possibly remotely healed infection. 3. Multifactorial degenerative changes at L4-5 with resultant moderate spinal stenosis. Mild to moderate bilateral L2 through L5 foraminal narrowing as above. Electronically Signed   By: Morene Hoard M.D.   On: 06/12/2023 22:38   MR THORACIC SPINE WO CONTRAST Result Date: 06/12/2023 CLINICAL DATA:  Initial evaluation for possible osteomyelitis, bacteremia with back pain. EXAM: MRI THORACIC SPINE WITHOUT CONTRAST TECHNIQUE: Multiplanar, multisequence MR imaging of the thoracic spine was performed. No  intravenous contrast was administered. COMPARISON:  Comparison made with prior CT from 06/10/2023. FINDINGS: Alignment: Mild exaggeration of the normal thoracic kyphosis. Trace stepwise degenerative anterolisthesis of T1 on T2 through T3 on T4. Vertebrae: Vertebral body height maintained without acute or chronic fracture. Bone marrow signal intensity within normal limits. Subcentimeter benign hemangioma noted within the T6 vertebral body. No worrisome osseous lesions. No findings to suggest osteomyelitis discitis or septic arthritis. Cord:  Normal signal and morphology.  No epidural collections. Paraspinal and other soft tissues: Paraspinous soft tissues demonstrate no acute finding. Moderate to large layering bilateral pleural effusions noted. Disc levels: Or narrowing for age multilevel disc desiccation with mild degenerative endplate spurring noted throughout the thoracic spine. No significant disc  bulge or focal disc herniation. No spinal stenosis. Foramina remain patent. No neural impingement. IMPRESSION: 1. No MRI evidence for infection within the thoracic spine. 2. Ordinary for age multilevel thoracic spondylosis without significant stenosis or neural impingement. 3. Moderate to large layering bilateral pleural effusions. Electronically Signed   By: Morene Hoard M.D.   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 Cdiff neg Could this be secondary to the above infection   MRI thoracic spine no lesions   AKI-iimproving   Anemia   Leucocytosis- improving   H/o mitral valve endocarditis H/o streptococcus bacteremia   H/o left leg acute ischemia, compartment syndrome in 2023  Discussed the management of the patient

## 2023-06-13 NOTE — Progress Notes (Signed)
 ANTICOAGULATION CONSULT NOTE  Pharmacy Consult for heparin  infusion Indication: afib  No Known Allergies  Patient Measurements: Height: 5' 5 (165.1 cm) Weight: 102 kg (224 lb 13.9 oz) IBW/kg (Calculated) : 57 Heparin  Dosing Weight: 80.5 kg  Vital Signs: Temp: 98 F (36.7 C) (02/07 1952) Temp Source: Oral (02/07 1952) BP: 116/64 (02/07 1952) Pulse Rate: 47 (02/07 1952)  Labs: Recent Labs    06/11/23 0635 06/11/23 1123 06/11/23 2008 06/12/23 0723 06/12/23 1557 06/13/23 0206 06/13/23 1236 06/13/23 2054  HGB 8.5*  --   --  9.1*  --  10.2*  --   --   HCT 27.5*  --   --  28.5*  --  33.0*  --   --   PLT 160  --   --  160  --  192  --   --   APTT  --  78* 55* 90*  --   --   --   --   HEPARINUNFRC  --   --   --  0.36   < > <0.10* 0.48 0.65  CREATININE 1.02*  --   --   --   --   --   --   --    < > = values in this interval not displayed.    Estimated Creatinine Clearance: 59 mL/min (A) (by C-G formula based on SCr of 1.02 mg/dL (H)).   Medical History: Past Medical History:  Diagnosis Date   Allergy    Anxiety    Arthritis    Asthma    Chronic back pain    Chronic combined systolic (congestive) and diastolic (congestive) heart failure (HCC)    a. 02/2020 Echo: EF 60-65%, no rwma. Gr2 DD. Nl RV size/fxn. Mild LAE. Mild MR; b. 10/2021 Echo: [facility] following MVR) EF 40 to 45%.   Depression    Diverticulitis    Endocarditis    a. 10/2021 S sanguinous bacteremia w/ MV veg-->32mm Mitris bioprosthetic valve, Maze, and LAA clip.   Hyperlipidemia    Hypertension    Lipoma    Mild mitral regurgitation    a. 08/2016 Echo: mild MR; b. 02/2020 Echo: Mild MR.   Neuromuscular disorder (HCC)    Obesity    Obstructive sleep apnea    PAF (paroxysmal atrial fibrillation) (HCC)    a. Dx 05/2016--> Xarelto  (CHA2DS2VASc = 3); b. 08/2016 Echo: EF 55-60%, no rwma, mild MR, mod dil LA; c. 03/2020 Zio: Avg HR 61 w/ Afib burden of 24% - max rate 189 (avg 93). 2 pauses - longest 4.7 secs  post-conversion pauses (occurred @ 5:42 am); d. 04/2022 s/p DCCV; e. 08/2022 s/p DCCV.   Paroxysmal SVT (supraventricular tachycardia) (HCC)    S/P mitral valve replacement    a. 10/2021 [facility] - Endocarditis/MV Veg-->91mm Mitris bioprosthetic valve, Maze, and LAA clip.   Sleep apnea    Stroke [facility])    a. 02/2020 MRI/A: multifocal acute ischemia in R MCA territory predominantly involving post insula and R middle frontal gyrus. Nl MRA.   Tachy-brady syndrome (HCC)    Tobacco abuse    Medications:  PTA Meds: Rivaroxaban  20 mg qpm, last dose unknown  Assessment: Pt is a 73 yo female presenting to ED c/o diarrhea x6 days and vomiting. Reports feeling of heart fluttering and pain that started x2 days ago, found with elevated BNP and Troponin I level trending up. Pt has hx of afib and is on rivaroxaban . CHADSVASc 6. Now found to have endocarditis with hx of mitral  bioprosthetic valve.   Goal of Therapy:  Heparin  level 0.3-0.7 units/ml Monitor platelets by anticoagulation protocol: Yes  Monitoring: 2/4: aPTT @ 1242 was 39 Subtherapeutic 2/4 2253 aPTT 54, subtherapeutic 2/5 1123 aPTT 78 2/5 2008 aPTT 55  SUBtherapeutic 2/6 0723 aPTT 90 HL 0.36 2/7 0206 HL < 0.10 subtherapeutic (confirmed level with lab) 2/7 1236 HL 0.48, therapeutic at 2100 units/hour 2/7 2054 HL 0.91m therapeutic x 2   Plan: Heparin  level remains therapeutic. Will continue heparin  infusion at 2100 unit/hr. Recheck heparin  level with AM labs. CBC daily while on heparin .   Yonah Tangeman Rodriguez-Guzman PharmD, BCPS 06/13/2023 9:20 PM

## 2023-06-13 NOTE — Progress Notes (Signed)
 ANTICOAGULATION CONSULT NOTE  Pharmacy Consult for heparin  infusion Indication: afib  No Known Allergies  Patient Measurements: Height: 5' 5 (165.1 cm) Weight: 102 kg (224 lb 13.9 oz) IBW/kg (Calculated) : 57 Heparin  Dosing Weight: 80.5 kg  Vital Signs: Temp: 97.9 F (36.6 C) (02/07 1314) Temp Source: Oral (02/07 1314) BP: 123/65 (02/07 1314) Pulse Rate: 49 (02/07 1314)  Labs: Recent Labs    06/10/23 1531 06/10/23 2253 06/11/23 9364 06/11/23 0635 06/11/23 1123 06/11/23 2008 06/12/23 0723 06/12/23 1557 06/13/23 0206 06/13/23 1236  HGB 9.4*  --  8.5*  --   --   --  9.1*  --  10.2*  --   HCT 29.7*  --  27.5*  --   --   --  28.5*  --  33.0*  --   PLT 178  --  160  --   --   --  160  --  192  --   APTT  --    < >  --   --  78* 55* 90*  --   --   --   HEPARINUNFRC  --   --   --    < >  --   --  0.36 <0.10* <0.10* 0.48  CREATININE 1.12*  --  1.02*  --   --   --   --   --   --   --   TROPONINIHS 228*  --   --   --   --   --   --   --   --   --    < > = values in this interval not displayed.    Estimated Creatinine Clearance: 59 mL/min (A) (by C-G formula based on SCr of 1.02 mg/dL (H)).   Medical History: Past Medical History:  Diagnosis Date   Allergy    Anxiety    Arthritis    Asthma    Chronic back pain    Chronic combined systolic (congestive) and diastolic (congestive) heart failure (HCC)    a. 02/2020 Echo: EF 60-65%, no rwma. Gr2 DD. Nl RV size/fxn. Mild LAE. Mild MR; b. 10/2021 Echo: [facility] following MVR) EF 40 to 45%.   Depression    Diverticulitis    Endocarditis    a. 10/2021 S sanguinous bacteremia w/ MV veg-->21mm Mitris bioprosthetic valve, Maze, and LAA clip.   Hyperlipidemia    Hypertension    Lipoma    Mild mitral regurgitation    a. 08/2016 Echo: mild MR; b. 02/2020 Echo: Mild MR.   Neuromuscular disorder (HCC)    Obesity    Obstructive sleep apnea    PAF (paroxysmal atrial fibrillation) (HCC)    a. Dx 05/2016--> Xarelto  (CHA2DS2VASc = 3); b.  08/2016 Echo: EF 55-60%, no rwma, mild MR, mod dil LA; c. 03/2020 Zio: Avg HR 61 w/ Afib burden of 24% - max rate 189 (avg 93). 2 pauses - longest 4.7 secs post-conversion pauses (occurred @ 5:42 am); d. 04/2022 s/p DCCV; e. 08/2022 s/p DCCV.   Paroxysmal SVT (supraventricular tachycardia) (HCC)    S/P mitral valve replacement    a. 10/2021 [facility] - Endocarditis/MV Veg-->24mm Mitris bioprosthetic valve, Maze, and LAA clip.   Sleep apnea    Stroke [facility])    a. 02/2020 MRI/A: multifocal acute ischemia in R MCA territory predominantly involving post insula and R middle frontal gyrus. Nl MRA.   Tachy-brady syndrome (HCC)    Tobacco abuse    Medications:  PTA Meds: Rivaroxaban  20 mg  qpm, last dose unknown  Assessment: Pt is a 73 yo female presenting to ED c/o diarrhea x6 days and vomiting. Reports feeling of heart fluttering and pain that started x2 days ago, found with elevated BNP and Troponin I level trending up. Pt has hx of afib and is on rivaroxaban . CHADSVASc 6. Now found to have endocarditis with hx of mitral bioprosthetic valve.   Goal of Therapy:  Heparin  level 0.3-0.7 units/ml Monitor platelets by anticoagulation protocol: Yes  Monitoring: 2/4: aPTT @ 1242 was 39 Subtherapeutic 2/4 2253 aPTT 54, subtherapeutic 2/5 1123 aPTT 78 2/5 2008 aPTT 55  SUBtherapeutic 2/6 0723 aPTT 90 HL 0.36 2/7 0206 HL < 0.10 subtherapeutic (confirmed level with lab) 2/7 1236 HL 0.48.    Plan: Heparin  level is therapeutic. Will continue heparin  infusion at 2100 unit/hr. Recheck heparin  level in 8 hours. CBC daily while on heparin .   Thank you for involving pharmacy in this patient's care.   Cathaleen Blanch, PharmD, BCPS 06/13/2023 1:23 PM

## 2023-06-14 DIAGNOSIS — I059 Rheumatic mitral valve disease, unspecified: Secondary | ICD-10-CM | POA: Diagnosis present

## 2023-06-14 DIAGNOSIS — R7989 Other specified abnormal findings of blood chemistry: Secondary | ICD-10-CM | POA: Diagnosis not present

## 2023-06-14 DIAGNOSIS — I480 Paroxysmal atrial fibrillation: Secondary | ICD-10-CM | POA: Diagnosis not present

## 2023-06-14 DIAGNOSIS — I330 Acute and subacute infective endocarditis: Secondary | ICD-10-CM | POA: Diagnosis not present

## 2023-06-14 DIAGNOSIS — R7881 Bacteremia: Secondary | ICD-10-CM | POA: Diagnosis not present

## 2023-06-14 LAB — HEPARIN LEVEL (UNFRACTIONATED): Heparin Unfractionated: 0.6 [IU]/mL (ref 0.30–0.70)

## 2023-06-14 LAB — BASIC METABOLIC PANEL
Anion gap: 10 (ref 5–15)
BUN: 21 mg/dL (ref 8–23)
CO2: 21 mmol/L — ABNORMAL LOW (ref 22–32)
Calcium: 8.2 mg/dL — ABNORMAL LOW (ref 8.9–10.3)
Chloride: 106 mmol/L (ref 98–111)
Creatinine, Ser: 2.42 mg/dL — ABNORMAL HIGH (ref 0.44–1.00)
GFR, Estimated: 21 mL/min — ABNORMAL LOW (ref >60)
Glucose, Bld: 111 mg/dL — ABNORMAL HIGH (ref 70–99)
Potassium: 3.8 mmol/L (ref 3.5–5.1)
Sodium: 137 mmol/L (ref 135–145)

## 2023-06-14 LAB — MAGNESIUM: Magnesium: 2.1 mg/dL (ref 1.7–2.4)

## 2023-06-14 LAB — CBC
HCT: 28.1 % — ABNORMAL LOW (ref 36.0–46.0)
Hemoglobin: 8.9 g/dL — ABNORMAL LOW (ref 12.0–15.0)
MCH: 26.6 pg (ref 26.0–34.0)
MCHC: 31.7 g/dL (ref 30.0–36.0)
MCV: 84.1 fL (ref 80.0–100.0)
Platelets: 168 10*3/uL (ref 150–400)
RBC: 3.34 MIL/uL — ABNORMAL LOW (ref 3.87–5.11)
RDW: 17.2 % — ABNORMAL HIGH (ref 11.5–15.5)
WBC: 12.7 10*3/uL — ABNORMAL HIGH (ref 4.0–10.5)
nRBC: 0.2 % (ref 0.0–0.2)

## 2023-06-14 LAB — RENAL FUNCTION PANEL
Albumin: 2.6 g/dL — ABNORMAL LOW (ref 3.5–5.0)
Anion gap: 9 (ref 5–15)
BUN: 22 mg/dL (ref 8–23)
CO2: 21 mmol/L — ABNORMAL LOW (ref 22–32)
Calcium: 8 mg/dL — ABNORMAL LOW (ref 8.9–10.3)
Chloride: 105 mmol/L (ref 98–111)
Creatinine, Ser: 2.45 mg/dL — ABNORMAL HIGH (ref 0.44–1.00)
GFR, Estimated: 20 mL/min — ABNORMAL LOW (ref >60)
Glucose, Bld: 111 mg/dL — ABNORMAL HIGH (ref 70–99)
Phosphorus: 4.3 mg/dL (ref 2.5–4.6)
Potassium: 3.8 mmol/L (ref 3.5–5.1)
Sodium: 135 mmol/L (ref 135–145)

## 2023-06-14 MED ORDER — METOPROLOL TARTRATE 25 MG PO TABS
25.0000 mg | ORAL_TABLET | Freq: Four times a day (QID) | ORAL | Status: DC
  Administered 2023-06-14 – 2023-06-15 (×3): 25 mg via ORAL
  Filled 2023-06-14 (×3): qty 1

## 2023-06-14 NOTE — Plan of Care (Signed)
  Problem: Education: Goal: Knowledge of General Education information will improve Description: Including pain rating scale, medication(s)/side effects and non-pharmacologic comfort measures Outcome: Progressing   Problem: Health Behavior/Discharge Planning: Goal: Ability to manage health-related needs will improve Outcome: Progressing   Problem: Clinical Measurements: Goal: Diagnostic test results will improve Outcome: Progressing Goal: Cardiovascular complication will be avoided Outcome: Progressing   Problem: Activity: Goal: Risk for activity intolerance will decrease Outcome: Progressing   Problem: Nutrition: Goal: Adequate nutrition will be maintained Outcome: Progressing

## 2023-06-14 NOTE — Progress Notes (Signed)
 Progress Note  Patient Name: Tina Peters Date of Encounter: 06/14/2023  Primary Cardiologist: Darron  Subjective   Developed Afib around 21:00 on 2/5, remains in Afib with ventricular rates largely in the low 100s bpm with episodes into the 120s bpm. No chest pain, dyspnea, or palpitations.  Diarrhea is improving.  Hgb trend 9.1-10.2-8.9 over the past 48 hours. BP stable. Did not sleep well last night.   Inpatient Medications    Scheduled Meds:  amiodarone   400 mg Oral BID   aspirin  EC  81 mg Oral Daily   metoprolol  tartrate  25 mg Oral Q6H   sertraline   50 mg Oral Daily   Continuous Infusions:  cefTRIAXone  (ROCEPHIN )  IV 2 g (06/14/23 0015)   heparin  2,100 Units/hr (06/13/23 1212)   PRN Meds: cyclobenzaprine , fentaNYL  (SUBLIMAZE ) injection, ondansetron  **OR** ondansetron  (ZOFRAN ) IV, traMADol    Vital Signs    Vitals:   06/13/23 1952 06/14/23 0000 06/14/23 0403 06/14/23 0748  BP: 116/64 (!) 127/92 (!) 114/57 119/89  Pulse: (!) 47 95 (!) 108 72  Resp: 18 18 18    Temp: 98 F (36.7 C) 97.9 F (36.6 C) 98.2 F (36.8 C) 98.2 F (36.8 C)  TempSrc: Oral Oral    SpO2: 96% 99% 99% 94%  Weight:      Height:        Intake/Output Summary (Last 24 hours) at 06/14/2023 1121 Last data filed at 06/13/2023 1418 Gross per 24 hour  Intake 240 ml  Output --  Net 240 ml   Filed Weights   06/09/23 2135 06/11/23 1205  Weight: 102 kg 102 kg    Telemetry    Developed Afib at 21:00 on 2/5 with ventricular rates in the low 100s bpm predominantly, with brief episodes into the 120s bpm - Personally Reviewed  ECG    No new tracings - Personally Reviewed  Physical Exam   GEN: No acute distress.   Neck: No JVD. Cardiac: Mildly tachycardic, IRIR, II/VI systolic murmur LSB, no rubs, or gallops.  Respiratory: Clear to auscultation bilaterally.  GI: Soft, nontender, non-distended.   MS: No edema; No deformity. Neuro:  Alert and oriented x 3; Nonfocal.  Psych: Normal  affect.  Labs    Chemistry Recent Labs  Lab 06/09/23 2153 06/10/23 1531 06/11/23 0635 06/14/23 0341 06/14/23 0344  NA 133*   < > 135 137 135  K 2.8*   < > 3.6 3.8 3.8  CL 97*   < > 106 106 105  CO2 20*   < > 22 21* 21*  GLUCOSE 139*   < > 99 111* 111*  BUN 17   < > 13 21 22   CREATININE 1.14*   < > 1.02* 2.42* 2.45*  CALCIUM  8.1*   < > 7.8* 8.2* 8.0*  PROT 6.8  --  5.7*  --   --   ALBUMIN 2.9*  --  2.4*  --  2.6*  AST 53*  --  27  --   --   ALT 27  --  19  --   --   ALKPHOS 107  --  77  --   --   BILITOT 1.8*  --  1.0  --   --   GFRNONAA 51*   < > 58* 21* 20*  ANIONGAP 16*   < > 7 10 9    < > = values in this interval not displayed.     Hematology Recent Labs  Lab 06/12/23 9276 06/13/23 0206 06/14/23 9658  WBC 12.0* 14.7* 12.7*  RBC 3.41* 3.83* 3.34*  HGB 9.1* 10.2* 8.9*  HCT 28.5* 33.0* 28.1*  MCV 83.6 86.2 84.1  MCH 26.7 26.6 26.6  MCHC 31.9 30.9 31.7  RDW 16.4* 16.8* 17.2*  PLT 160 192 168    Cardiac EnzymesNo results for input(s): TROPONINI in the last 168 hours. No results for input(s): TROPIPOC in the last 168 hours.   BNP Recent Labs  Lab 06/09/23 2153  BNP 720.2*     DDimer No results for input(s): DDIMER in the last 168 hours.   Radiology       Cardiac Studies   2D echo 06/10/2023: 1. Left ventricular ejection fraction, by estimation, is 50 to 55%. The  left ventricle has low normal function. The left ventricle has no regional  wall motion abnormalities. Left ventricular diastolic function could not  be evaluated.   2. Right ventricular systolic function is mildly reduced. The right  ventricular size is mildly enlarged.   3. Left atrial size was severely dilated.   4. Right atrial size was mildly dilated.   5. The mitral valve has been repaired/replaced. No evidence of mitral  valve regurgitation. Moderate to severe mitral stenosis. The mean mitral  valve gradient is 11.0 mmHg.   6. Tricuspid valve regurgitation is mild to  moderate.   7. The aortic valve is tricuspid. There is mild thickening of the aortic  valve. Aortic valve regurgitation is not visualized. Aortic valve  sclerosis/calcification is present, without any evidence of aortic  stenosis.   Conclusion(s)/Recommendation(s): Mitral valve prosthesis appears thickened  with increased mean gradient since last echo shortly after implantation in  10/2021 at [facility] (mean gratient 5 -> 11 mmHg). Endocarditis and/or pannus  formation cannot be excluded,  though a mobile vegetation is not clearly seen. Consider further  evaluation by transesophageal echocardiogram.  ________  TEE 06/11/2023: 1. Left ventricular ejection fraction, by estimation, is 55 to 60%. The  left ventricle has normal function. The left ventricle has no regional  wall motion abnormalities.   2. Right ventricular systolic function is mildly reduced. The right  ventricular size is normal.   3. Left atrial size was severely dilated. No left atrial/left atrial  appendage thrombus was detected.   4. Right atrial size was mildly dilated.   5. The mitral valve has been repaired/replaced. No evidence of mitral  valve regurgitation. Mild mitral stenosis. The mean mitral valve gradient  is 7.0 mmHg. There is a bioprosthetic valve present in the mitral  position. Echo findings are consistent with   vegetation of the posterior mitral prosthesis, with significant  thickening and a small mobile opacity coming from the posterior leaflet.   6. Tricuspid valve regurgitation is moderate.   7. The aortic valve is normal in structure. There is mild calcification  of the aortic valve. Aortic valve regurgitation is not visualized. Aortic  valve sclerosis is present, with no evidence of aortic valve stenosis.   8. The inferior vena cava is normal in size with greater than 50%  respiratory variability, suggesting right atrial pressure of 3 mmHg.   9. Agitated saline contrast bubble study was negative, with no  evidence  of any interatrial shunt.   Conclusion(s)/Recommendation(s): Thickening of MV posterior leflet with  mobile vegetation noted   Patient Profile     73 y.o. 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  Assessment & Plan    1.  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  PAF: -Presented in sinus rhythm with development of A-fib at 21:00 on 06/11/2023, remains in A-fib with ventricular rates largely in the low 100s bpm with brief episodes into the 120s bpm -Asymptomatic -Continue reload with amiodarone  400 mg twice daily -For added ventricular rate control, she was transitioned from Toprol  XL 25 mg daily to Lopressor  at titrated dose of 25 mg bid on 2/7 -Titrate Lopressor  to 25 mg q 6 hours for added rate control -Continue heparin  infusion with recommendation to transition to back to PTA rivaroxaban  prior to discharge  3.  Elevated troponin: -Suspected to be  supply/demand ischemia in the setting of bacteremia -Echo this admission with preserved LV systolic function and no focal wall motion abnormality -Continue heparin , aspirin , and beta-blocker  4.  Anemia: -Hemoglobin trend from 9.1 to 10.2 to 8.9 over the past 48 hours, remains down from a baseline around 12 -Denies symptoms concerning for bleeding -Monitor on heparin  drip -Per IM  5.  Hypokalemia: -Likely from GI loss -Improving       For questions or updates, please contact [facility] Please consult [email or web address] for contact info under Cardiology/STEMI.    Signed, Bernardino Bring, PA-C [facility] Pager: [phone] 06/14/2023, 11:21 AM

## 2023-06-14 NOTE — Progress Notes (Signed)
 ANTICOAGULATION CONSULT NOTE  Pharmacy Consult for heparin  infusion Indication: afib  No Known Allergies  Patient Measurements: Height: 5' 5 (165.1 cm) Weight: 102 kg (224 lb 13.9 oz) IBW/kg (Calculated) : 57 Heparin  Dosing Weight: 80.5 kg  Vital Signs: Temp: 98.2 F (36.8 C) (02/08 0403) Temp Source: Oral (02/08 0000) BP: 114/57 (02/08 0403) Pulse Rate: 108 (02/08 0403)  Labs: Recent Labs    06/11/23 9364 06/11/23 1123 06/11/23 2008 06/12/23 0723 06/12/23 1557 06/13/23 0206 06/13/23 1236 06/13/23 2054 06/14/23 0341 06/14/23 0344  HGB 8.5*  --   --  9.1*  --  10.2*  --   --  8.9*  --   HCT 27.5*  --   --  28.5*  --  33.0*  --   --  28.1*  --   PLT 160  --   --  160  --  192  --   --  168  --   APTT  --  78* 55* 90*  --   --   --   --   --   --   HEPARINUNFRC  --   --   --  0.36   < > <0.10* 0.48 0.65 0.60  --   CREATININE 1.02*  --   --   --   --   --   --   --  2.42* 2.45*   < > = values in this interval not displayed.    Estimated Creatinine Clearance: 24.6 mL/min (A) (by C-G formula based on SCr of 2.45 mg/dL (H)).   Medical History: Past Medical History:  Diagnosis Date   Allergy    Anxiety    Arthritis    Asthma    Chronic back pain    Chronic combined systolic (congestive) and diastolic (congestive) heart failure (HCC)    a. 02/2020 Echo: EF 60-65%, no rwma. Gr2 DD. Nl RV size/fxn. Mild LAE. Mild MR; b. 10/2021 Echo: [facility] following MVR) EF 40 to 45%.   Depression    Diverticulitis    Endocarditis    a. 10/2021 S sanguinous bacteremia w/ MV veg-->40mm Mitris bioprosthetic valve, Maze, and LAA clip.   Hyperlipidemia    Hypertension    Lipoma    Mild mitral regurgitation    a. 08/2016 Echo: mild MR; b. 02/2020 Echo: Mild MR.   Neuromuscular disorder (HCC)    Obesity    Obstructive sleep apnea    PAF (paroxysmal atrial fibrillation) (HCC)    a. Dx 05/2016--> Xarelto  (CHA2DS2VASc = 3); b. 08/2016 Echo: EF 55-60%, no rwma, mild MR, mod dil LA; c.  03/2020 [facility]: Avg HR 61 w/ Afib burden of 24% - max rate 189 (avg 93). 2 pauses - longest 4.7 secs post-conversion pauses (occurred @ 5:42 am); d. 04/2022 s/p DCCV; e. 08/2022 s/p DCCV.   Paroxysmal SVT (supraventricular tachycardia) (HCC)    S/P mitral valve replacement    a. 10/2021 [facility] - Endocarditis/MV Veg-->86mm Mitris bioprosthetic valve, Maze, and LAA clip.   Sleep apnea    Stroke [facility])    a. 02/2020 MRI/A: multifocal acute ischemia in R MCA territory predominantly involving post insula and R middle frontal gyrus. Nl MRA.   Tachy-brady syndrome (HCC)    Tobacco abuse    Medications:  PTA Meds: Rivaroxaban  20 mg qpm, last dose unknown  Assessment: Pt is a 73 yo female presenting to ED c/o diarrhea x6 days and vomiting. Reports feeling of heart fluttering and pain that started x2 days ago, found with  elevated BNP and Troponin I level trending up. Pt has hx of afib and is on rivaroxaban . CHADSVASc 6. Now found to have endocarditis with hx of mitral bioprosthetic valve.   Goal of Therapy:  Heparin  level 0.3-0.7 units/ml Monitor platelets by anticoagulation protocol: Yes  Monitoring: 2/4: aPTT @ 1242 was 39 Subtherapeutic 2/4 2253 aPTT 54, subtherapeutic 2/5 1123 aPTT 78 2/5 2008 aPTT 55  SUBtherapeutic 2/6 0723 aPTT 90 HL 0.36 2/7 0206 HL < 0.10 subtherapeutic (confirmed level with lab) 2/7 1236 HL 0.48, therapeutic at 2100 units/hour 2/7 2054 HL 0.75m therapeutic x 2 2/8 0341 HL 0.60, therapeutic x 3   Plan: Heparin  level remains therapeutic. Will continue heparin  infusion at 2100 unit/hr. Recheck heparin  level with AM labs. CBC daily while on heparin .   Rankin CANDIE Dills, PharmD, [facility] 06/14/2023 5:39 AM

## 2023-06-14 NOTE — Progress Notes (Signed)
 PROGRESS NOTE    Tina Peters   FMW:[id number] DOB: [birth year]  DOA: 06/09/2023 Date of Service: 06/14/23 which is hospital day 4  PCP: Myrla Jon HERO, MD    Hospital course / significant events:   HPI: 73 year old female history of A-fib on Xarelto , hypertension, CKD stage IIIa, prior history of stroke, history of systolic heart failure EF of 45%, hypertension, presents to the ER with a 1-week history of nausea, vomiting and diarrhea.  Patient states that she got sick last week.  Was seen in the ER on January 26.  She had palpitations that time.  Workup was negative.  Patient discharged to home.  She followed up in the office and was diagnosed with flulike symptoms.  She had a respiratory viral panel that was negative.  She continued to have diarrhea along with nausea and vomiting.  She states that she felt some tightness in her chest and her abdomen.  She has been vomiting.  She felt some palpitations in the middle of the night.  She came to the ER 02/03 for evaluation.  02/03: to ED evening - elevated BNP, procalcitonin, troponin 283 --> 331 started on heparin , lactate 2.6 -(1L IVF)--> 1.7, started on broad spectrum abx cefepime  + vanc + flagyl .  02/04: admitted to hospitalist service w/ concern for gastroenteritis w/ concern for associated sepsis (reasonably ruled out UTI, PNA, colitis or other intraabdominal bacterial infection).  02/05: unfortunately (+)TEE for mitral valve vegetation.  Blood culture (+)gram positive cocci in anaerobic bottle. ID consulted --> await final BCx results, continue ceftriaxone  + flagyl , checking CDiff and may need colonoscopy, recs for MRI spine d/t pain/bacteremia. Pt reports diarrhea has improved but still present. CDiff neg. Presented in sinus rhythm with development of A-fib at 21:00 on 06/11/2023, remains in A-fib with ventricular rates largely in the low 100s bpm with brief episodes into the 120s to 140s bpm  02/06: cardiology adjusting amio and beta  blocker. MRI T/L spine: no findings to indicate osteomyelitis/discitis. GPC culture reincubated for better growth 02/07: repeat BCx draw this morning   02/08: no growth thus far on cultures. Await final likely tomorrow      Consultants:  Cardiology  Infectious disease   Procedures/Surgeries: TEE 06/11/23       ASSESSMENT & PLAN:   Sepsis with acute organ dysfunction without septic shock  Initially thought d/t GI illness, now w/ dx endocarditis, sepsis likely multifactorial   Endocarditis  Mitral valve endocarditis, s/p bioprosthetic AVR in 10/2021  Gram positive cocci bacteremia  Confirmed on TEE 02/05 showing mobile opacity on posterior leaflet mitral valve prosthetic valve.  Infection likely caught relatively early given no significant valve regurgitation, grossly no abscess formation though will need to be monitored closely  Hx: June 2023 admitted to [facility] Streptococcus sanguinous bacteremia from infected tooth with mitral valve vegetation, at that time underwent mitral valve replacement with bioprosthetic valve MRI spine L and T spine given back pain in both areas --> no evidence osteo/discitis CT maxillofacial no dental abscess  ID following Expect long-term IV Abx, PICC line Following for BCx ID/Susc  Following second set BCx Cardiology following, tentative plan for repeat TEE in 6 weeks to reevaluate mitral valve  For any worsening valve damage, will need CT surgery consultation   Anemia No s/s bleeding Caution on heparin  Monitor Hgb/CBC  Acute gastroenteritis - improving  Clinically, concern for norovirus vs other viral illness, CDiff engative  check stool studies --> negative / nothing detected  Enteric/contact precautions either  way  Diet adv as tolerated     Back pain Ruled out osteomyelitis/discitis Tylenol  prn mild Tramadol  prn mod-severe Fentanyl  IV prn breakthrough Pt educated on goal to avoid IV pain meds if at all able   Demand ischemia   Elevated troponin Favor demand ischarmia over NSTEMI Monitoring for chest pain   Hypokalemia D/t diarrhea Replace as needed Monitor BMP   Chronic paroxysmal A-fib  On admission, EKG shows normal sinus rhythm.   Had more rapid rate here transiently Echo this admission with preserved LV systolic function and no focal wall motion abnormality  Xarelto  for anticoagulation at home - held while on heparin  (in case needing further procedure but anticipate xarelto  prior to dc) Reloaded w/ amiodarone  po  Toprol  switched to lopressor  - cardiology is titrating this  Cardiology following    On amiodarone  Thyroid  panel per cardiology   History HFrEF (heart failure with reduced ejection fraction) NOT in exacerbation  On admission, patient has actually intra vascular volume depleted.   Echo this admission with preserved LV systolic function and no focal wall motion abnormality  Cardiology following    Stage 3a chronic kidney disease (HCC) - Baseline creatinine 1.1-1.4 On admission, patient has a history of CKD stage IIIa.  Baseline creatinine 1.1-1.4.  Cr on admission is close to baseline, no AKI Hold her Aldactone  and her losartan  for now.   OSA (obstructive sleep apnea) On admission continue CPAP   Essential hypertension Home blood pressure medications include Aldactone , Toprol -XL, Cozaar .   hold her Cozaar  and Aldactone  due to dehydration.   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  Underweight - under 18  overweight - 25 to 29 obese - 30 or more Class 1 obesity: BMI of 30.0 to 34 Class 2 obesity: BMI of 35.0 to 39 Class 3 obesity: BMI of 40.0 to 49 Super Morbid Obesity: BMI 50-59 Super-super Morbid Obesity: BMI 60+ Significantly low or high BMI is associated with higher medical risk.  Weight  management advised as adjunct to other disease management and risk reduction treatments    DVT prophylaxis: heparin  IV fluids: no continuous IV fluids  Nutrition: regular diet Central lines / invasive devices: none  Code Status: FULL CODE ACP documentation reviewed:  none on file in [facility]  TOC needs: home health, PT/OT and IV abx  Barriers to dispo / significant pending items: IV abx, PICC line, repeat BCx, expect will be here couple more days pending ID clearance and abx plan prior to discharge but expect this can be in place on Monday pending further complications and pending second blood cultures              Subjective / Brief ROS:  Patient reports diarrhea has improved overall but still present Pt reports low appetitie but no abd pain, no nausea Reports low urine output no feeling of bladder fullness  Reports back pain - middle and lower pain better than yesterday  Denies CP/SOB.  Denies new weakness. .   Family Communication: no new updates at this time     Objective Findings:  Vitals:   06/14/23 0000 06/14/23 0403 06/14/23 0748 06/14/23 1149  BP: (!) 127/92 (!) 114/57 119/89 114/80  Pulse: 95 (!) 108 72 (!) 106  Resp: 18 18    Temp: 97.9  F (36.6 C) 98.2 F (36.8 C) 98.2 F (36.8 C) 98.2 F (36.8 C)  TempSrc: Oral     SpO2: 99% 99% 94% 98%  Weight:      Height:        Intake/Output Summary (Last 24 hours) at 06/14/2023 1353 Last data filed at 06/13/2023 1418 Gross per 24 hour  Intake 240 ml  Output --  Net 240 ml   Filed Weights   06/09/23 2135 06/11/23 1205  Weight: 102 kg 102 kg    Examination:  Physical Exam Constitutional:      General: She is not in acute distress. Cardiovascular:     Rate and Rhythm: Normal rate and regular rhythm.  Pulmonary:     Effort: Pulmonary effort is normal.     Breath sounds: Normal breath sounds.  Neurological:     General: No focal deficit present.     Mental Status: She is alert and oriented to  person, place, and time. Mental status is at baseline.  Psychiatric:        Mood and Affect: Mood normal.        Behavior: Behavior normal.          Scheduled Medications:   amiodarone   400 mg Oral BID   aspirin  EC  81 mg Oral Daily   metoprolol  tartrate  25 mg Oral Q6H   sertraline   50 mg Oral Daily    Continuous Infusions:  cefTRIAXone  (ROCEPHIN )  IV 2 g (06/14/23 0015)   heparin  2,100 Units/hr (06/13/23 1212)    PRN Medications:  cyclobenzaprine , fentaNYL  (SUBLIMAZE ) injection, ondansetron  **OR** ondansetron  (ZOFRAN ) IV, traMADol   Antimicrobials from admission:  Anti-infectives (From admission, onward)    Start     Dose/Rate Route Frequency Ordered Stop   06/11/23 2200  metroNIDAZOLE  (FLAGYL ) IVPB 500 mg  Status:  Discontinued        500 mg 100 mL/hr over 60 Minutes Intravenous Every 12 hours 06/11/23 1628 06/13/23 1541   06/11/23 0015  cefTRIAXone  (ROCEPHIN ) 2 g in sodium chloride  0.9 % 100 mL IVPB        2 g 200 mL/hr over 30 Minutes Intravenous Every 24 hours 06/10/23 2320     06/11/23 0000  metroNIDAZOLE  (FLAGYL ) tablet 500 mg  Status:  Discontinued        500 mg Oral Every 12 hours 06/10/23 2320 06/11/23 1628   06/09/23 2245  ceFEPIme  (MAXIPIME ) 2 g in sodium chloride  0.9 % 100 mL IVPB        2 g 200 mL/hr over 30 Minutes Intravenous  Once 06/09/23 2238 06/09/23 2324   06/09/23 2245  metroNIDAZOLE  (FLAGYL ) IVPB 500 mg        500 mg 100 mL/hr over 60 Minutes Intravenous  Once 06/09/23 2238 06/10/23 0035   06/09/23 2245  vancomycin  (VANCOCIN ) IVPB 1000 mg/200 mL premix        1,000 mg 200 mL/hr over 60 Minutes Intravenous  Once 06/09/23 2238 06/10/23 0035           Data Reviewed:  I have personally reviewed the following...  CBC: Recent Labs  Lab 06/09/23 2153 06/10/23 1531 06/11/23 0635 06/12/23 0723 06/13/23 0206 06/14/23 0341  WBC 43.3* 22.2* 16.8* 12.0* 14.7* 12.7*  NEUTROABS 38.8* 20.1* 13.8*  --   --   --   HGB 10.2* 9.4* 8.5* 9.1*  10.2* 8.9*  HCT 31.4* 29.7* 27.5* 28.5* 33.0* 28.1*  MCV 82.2 84.9 84.1 83.6 86.2 84.1  PLT 186 178 160 160 192  168   Basic Metabolic Panel: Recent Labs  Lab 06/09/23 2153 06/10/23 1531 06/11/23 0635 06/14/23 0341 06/14/23 0344  NA 133* 137 135 137 135  K 2.8* 3.7 3.6 3.8 3.8  CL 97* 103 106 106 105  CO2 20* 24 22 21* 21*  GLUCOSE 139* 130* 99 111* 111*  BUN 17 15 13 21 22   CREATININE 1.14* 1.12* 1.02* 2.42* 2.45*  CALCIUM  8.1* 7.9* 7.8* 8.2* 8.0*  MG 1.8 2.3  --  2.1  --   PHOS  --   --   --   --  4.3   GFR: Estimated Creatinine Clearance: 24.6 mL/min (A) (by C-G formula based on SCr of 2.45 mg/dL (H)). Liver Function Tests: Recent Labs  Lab 06/09/23 2153 06/11/23 0635 06/14/23 0344  AST 53* 27  --   ALT 27 19  --   ALKPHOS 107 77  --   BILITOT 1.8* 1.0  --   PROT 6.8 5.7*  --   ALBUMIN 2.9* 2.4* 2.6*   Recent Labs  Lab 06/09/23 2153  LIPASE 22   No results for input(s): AMMONIA in the last 168 hours. Coagulation Profile: Recent Labs  Lab 06/10/23 0359  INR 1.6*   Cardiac Enzymes: No results for input(s): CKTOTAL, CKMB, CKMBINDEX, TROPONINI in the last 168 hours. BNP (last 3 results) No results for input(s): PROBNP in the last 8760 hours. HbA1C: No results for input(s): HGBA1C in the last 72 hours. CBG: No results for input(s): GLUCAP in the last 168 hours. Lipid Profile: No results for input(s): CHOL, HDL, LDLCALC, TRIG, CHOLHDL, LDLDIRECT in the last 72 hours. Thyroid  Function Tests: No results for input(s): TSH, T4TOTAL, FREET4, T3FREE, THYROIDAB in the last 72 hours.  Anemia Panel: No results for input(s): VITAMINB12, FOLATE, FERRITIN, TIBC, IRON, RETICCTPCT in the last 72 hours. Most Recent Urinalysis On File:     Component Value Date/Time   COLORURINE YELLOW (A) 06/09/2023 2153   APPEARANCEUR CLEAR (A) 06/09/2023 2153   LABSPEC 1.028 06/09/2023 2153   PHURINE 6.0 06/09/2023 2153    GLUCOSEU NEGATIVE 06/09/2023 2153   HGBUR SMALL (A) 06/09/2023 2153   BILIRUBINUR NEGATIVE 06/09/2023 2153   KETONESUR NEGATIVE 06/09/2023 2153   PROTEINUR NEGATIVE 06/09/2023 2153   NITRITE NEGATIVE 06/09/2023 2153   LEUKOCYTESUR NEGATIVE 06/09/2023 2153   Sepsis Labs: @LABRCNTIP (procalcitonin:4,lacticidven:4) Microbiology: Recent Results (from the past 240 hours)  Gastrointestinal Panel by PCR , Stool     Status: None   Collection Time: 06/09/23 12:42 PM   Specimen: Stool  Result Value Ref Range Status   Campylobacter species NOT DETECTED NOT DETECTED Final   Plesimonas shigelloides NOT DETECTED NOT DETECTED Final   Salmonella species NOT DETECTED NOT DETECTED Final   Yersinia enterocolitica NOT DETECTED NOT DETECTED Final   Vibrio species NOT DETECTED NOT DETECTED Final   Vibrio cholerae NOT DETECTED NOT DETECTED Final   Enteroaggregative E coli (EAEC) NOT DETECTED NOT DETECTED Final   Enteropathogenic E coli (EPEC) NOT DETECTED NOT DETECTED Final   Enterotoxigenic E coli (ETEC) NOT DETECTED NOT DETECTED Final   Shiga like toxin producing E coli (STEC) NOT DETECTED NOT DETECTED Final   Shigella/Enteroinvasive E coli (EIEC) NOT DETECTED NOT DETECTED Final   Cryptosporidium NOT DETECTED NOT DETECTED Final   Cyclospora cayetanensis NOT DETECTED NOT DETECTED Final   Entamoeba histolytica NOT DETECTED NOT DETECTED Final   Giardia lamblia NOT DETECTED NOT DETECTED Final   Adenovirus F40/41 NOT DETECTED NOT DETECTED Final   Astrovirus NOT DETECTED NOT  DETECTED Final   Norovirus GI/GII NOT DETECTED NOT DETECTED Final   Rotavirus A NOT DETECTED NOT DETECTED Final   Sapovirus (I, II, IV, and V) NOT DETECTED NOT DETECTED Final    Comment: Performed at [facility], [address] [place], [place] [address]  Resp panel by RT-PCR (RSV, Flu A&B, Covid) Anterior Nasal Swab     Status: None   Collection Time: 06/09/23  9:53 PM   Specimen: Anterior Nasal Swab  Result Value Ref  Range Status   SARS Coronavirus 2 by RT PCR NEGATIVE NEGATIVE Final    Comment: (NOTE) SARS-CoV-2 target nucleic acids are NOT DETECTED.  The SARS-CoV-2 RNA is generally detectable in upper respiratory specimens during the acute phase of infection. The lowest concentration of SARS-CoV-2 viral copies this assay can detect is 138 copies/mL. A negative result does not preclude SARS-Cov-2 infection and should not be used as the sole basis for treatment or other patient management decisions. A negative result may occur with  improper specimen collection/handling, submission of specimen other than nasopharyngeal swab, presence of viral mutation(s) within the areas targeted by this assay, and inadequate number of viral copies(<138 copies/mL). A negative result must be combined with clinical observations, patient history, and epidemiological information. The expected result is Negative.  Fact Sheet for Patients:  bloggercourse.com  Fact Sheet for Healthcare Providers:  seriousbroker.it  This test is no t yet approved or cleared by the [place]  FDA and  has been authorized for detection and/or diagnosis of SARS-CoV-2 by FDA under an Emergency Use Authorization (EUA). This EUA will remain  in effect (meaning this test can be used) for the duration of the COVID-19 declaration under Section 564(b)(1) of the Act, 21 U.S.C.section 360bbb-3(b)(1), unless the authorization is terminated  or revoked sooner.       Influenza A by PCR NEGATIVE NEGATIVE Final   Influenza B by PCR NEGATIVE NEGATIVE Final    Comment: (NOTE) The Xpert Xpress SARS-CoV-2/FLU/RSV plus assay is intended as an aid in the diagnosis of influenza from Nasopharyngeal swab specimens and should not be used as a sole basis for treatment. Nasal washings and aspirates are unacceptable for Xpert Xpress SARS-CoV-2/FLU/RSV testing.  Fact Sheet for  Patients: bloggercourse.com  Fact Sheet for Healthcare Providers: seriousbroker.it  This test is not yet approved or cleared by the [place]  FDA and has been authorized for detection and/or diagnosis of SARS-CoV-2 by FDA under an Emergency Use Authorization (EUA). This EUA will remain in effect (meaning this test can be used) for the duration of the COVID-19 declaration under Section 564(b)(1) of the Act, 21 U.S.C. section 360bbb-3(b)(1), unless the authorization is terminated or revoked.     Resp Syncytial Virus by PCR NEGATIVE NEGATIVE Final    Comment: (NOTE) Fact Sheet for Patients: bloggercourse.com  Fact Sheet for Healthcare Providers: seriousbroker.it  This test is not yet approved or cleared by the [place]  FDA and has been authorized for detection and/or diagnosis of SARS-CoV-2 by FDA under an Emergency Use Authorization (EUA). This EUA will remain in effect (meaning this test can be used) for the duration of the COVID-19 declaration under Section 564(b)(1) of the Act, 21 U.S.C. section 360bbb-3(b)(1), unless the authorization is terminated or revoked.  Performed at [facility], [address] [place], [place] [address]   Culture, blood (single)     Status: None (Preliminary result)   Collection Time: 06/09/23 10:59 PM   Specimen: BLOOD LEFT ARM  Result Value Ref Range Status  Specimen Description   Final    BLOOD LEFT ARM Performed at [facility], [address] [place], [place] [address]    Special Requests   Final    BOTTLES DRAWN AEROBIC AND ANAEROBIC Blood Culture results may not be optimal due to an inadequate volume of blood received in culture bottles Performed at [facility], [address] [place], [place] [address]    Culture  Setup Time   Final    GRAM POSITIVE COCCI IN BOTH AEROBIC AND ANAEROBIC  BOTTLES CRITICAL RESULT CALLED TO, READ BACK BY AND VERIFIED WITH: SHEEMA HALLAJI PHARMD 0911 06/11/23 HNM GRAM STAIN REVIEWED-AGREE WITH RESULT DRT    Culture   Final    CORRECTED RESULTS GAMELLA HAEMOLYSANS PREVIOUSLY REPORTED AS: GAMMA HEMOLYSIS CORRECTED RESULTS CALLED TO: DR FREDIA FRESH [id number] AT 1524, ADC Performed at [facility], [address], [place], [place] [address]    Report Status PENDING  Incomplete  Blood Culture ID Panel (Reflexed)     Status: None   Collection Time: 06/09/23 10:59 PM  Result Value Ref Range Status   Enterococcus faecalis NOT DETECTED NOT DETECTED Final   Enterococcus Faecium NOT DETECTED NOT DETECTED Final   Listeria monocytogenes NOT DETECTED NOT DETECTED Final   Staphylococcus species NOT DETECTED NOT DETECTED Final   Staphylococcus aureus (BCID) NOT DETECTED NOT DETECTED Final   Staphylococcus epidermidis NOT DETECTED NOT DETECTED Final   Staphylococcus lugdunensis NOT DETECTED NOT DETECTED Final   Streptococcus species NOT DETECTED NOT DETECTED Final   Streptococcus agalactiae NOT DETECTED NOT DETECTED Final   Streptococcus pneumoniae NOT DETECTED NOT DETECTED Final   Streptococcus pyogenes NOT DETECTED NOT DETECTED Final   A.calcoaceticus-baumannii NOT DETECTED NOT DETECTED Final   Bacteroides fragilis NOT DETECTED NOT DETECTED Final   Enterobacterales NOT DETECTED NOT DETECTED Final   Enterobacter cloacae complex NOT DETECTED NOT DETECTED Final   Escherichia coli NOT DETECTED NOT DETECTED Final   Klebsiella aerogenes NOT DETECTED NOT DETECTED Final   Klebsiella oxytoca NOT DETECTED NOT DETECTED Final   Klebsiella pneumoniae NOT DETECTED NOT DETECTED Final   Proteus species NOT DETECTED NOT DETECTED Final   Salmonella species NOT DETECTED NOT DETECTED Final   Serratia marcescens NOT DETECTED NOT DETECTED Final   Haemophilus influenzae NOT DETECTED NOT DETECTED Final   Neisseria meningitidis NOT DETECTED NOT DETECTED Final    Pseudomonas aeruginosa NOT DETECTED NOT DETECTED Final   Stenotrophomonas maltophilia NOT DETECTED NOT DETECTED Final   Candida albicans NOT DETECTED NOT DETECTED Final   Candida auris NOT DETECTED NOT DETECTED Final   Candida glabrata NOT DETECTED NOT DETECTED Final   Candida krusei NOT DETECTED NOT DETECTED Final   Candida parapsilosis NOT DETECTED NOT DETECTED Final   Candida tropicalis NOT DETECTED NOT DETECTED Final   Cryptococcus neoformans/gattii NOT DETECTED NOT DETECTED Final    Comment: Performed at [facility], [address] [place], [place] [address]  Blood culture (single)     Status: None (Preliminary result)   Collection Time: 06/10/23 12:53 AM   Specimen: BLOOD  Result Value Ref Range Status   Specimen Description BLOOD RIGHT ARM  Final   Special Requests   Final    BOTTLES DRAWN AEROBIC AND ANAEROBIC Blood Culture results may not be optimal due to an inadequate volume of blood received in culture bottles   Culture   Final    NO GROWTH 4 DAYS Performed at [facility], [address] [place], [place]  [address]    Report Status PENDING  Incomplete  C Difficile Quick Screen w PCR reflex     Status: None   Collection Time: 06/11/23  9:28 PM   Specimen: STOOL  Result Value Ref Range Status   C Diff antigen NEGATIVE NEGATIVE Final   C Diff toxin NEGATIVE NEGATIVE Final   C Diff interpretation No C. difficile detected.  Final    Comment: Performed at [facility], [address] [place], [place] [address]  Culture, blood (Routine X 2) w Reflex to ID Panel     Status: None (Preliminary result)   Collection Time: 06/13/23  6:26 AM   Specimen: BLOOD LEFT ARM  Result Value Ref Range Status   Specimen Description   Final    BLOOD LEFT ARM Performed at [facility], [address] [place], [place] [address]    Special Requests   Final    BOTTLES DRAWN AEROBIC AND ANAEROBIC Blood Culture adequate volume   Culture  Setup Time PENDING   Incomplete   Culture   Final    NO GROWTH < 24 HOURS Performed at [facility], [address] [place], [place] [address]    Report Status PENDING  Incomplete  Culture, blood (Routine X 2) w Reflex to ID Panel     Status: None (Preliminary result)   Collection Time: 06/13/23  6:29 AM   Specimen: BLOOD  Result Value Ref Range Status   Specimen Description BLOOD BLOOD LEFT HAND  Final   Special Requests   Final    BOTTLES DRAWN AEROBIC AND ANAEROBIC Blood Culture results may not be optimal due to an inadequate volume of blood received in culture bottles   Culture   Final    NO GROWTH < 24 HOURS Performed at [facility], [address], [place], [place] [address]    Report Status PENDING  Incomplete      Radiology Studies last 3 days: CT MAXILLOFACIAL WO CONTRAST Result Date: 06/13/2023 CLINICAL DATA:  Family concerned for dental abscess. History of dental disease causing endocarditis. EXAM: CT MAXILLOFACIAL WITHOUT CONTRAST TECHNIQUE: Multidetector CT imaging of the maxillofacial structures was performed. Multiplanar CT image reconstructions were also generated. RADIATION DOSE REDUCTION: This exam was performed according to the departmental dose-optimization program which includes automated exposure control, adjustment of the mA and/or kV according to patient size and/or use of iterative reconstruction technique. COMPARISON:  Facial CT 03/31/2023. FINDINGS: Osseous: No fracture or mandibular dislocation. No destructive process. Again noted the upper teeth have all been removed as well as the left mandibular molars and left mandibular second bicuspid. There is metallic artifact from fillings in the right mandibular molars. There are no periapical lucencies around the roots of the remaining teeth and no advanced decay is seen. Orbits: Negative. No traumatic or inflammatory finding. 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 Small retention cyst or polyp  posteriorly in the left maxillary sinus. Remaining paranasal sinuses, bilateral mastoid air cells, and bilateral middle ears are clear. The nasal passages and both ostiomeatal complexes are patent. There is an S shaped nasal septum with left-sided spurring. Soft tissues: No abscess or focal inflammatory process is seen. Limited intracranial: Mild cerebral atrophy and small-vessel disease. Calcific plaque in the carotid siphons. No acute findings involving the visualized brain. IMPRESSION: 1. No evidence of dental abscess or advanced decay. 2. No acute facial bone findings. 3. Chronic right frontal sinus osteoma. 4. Small retention cyst or polyp posteriorly in  the left maxillary sinus. 5. S shaped nasal septum with left-sided spurring. 6. Mild cerebral atrophy and small-vessel disease. 7. Carotid atherosclerosis. Electronically Signed   By: Francis Quam M.D.   On: 06/13/2023 23:22   MR LUMBAR SPINE WO CONTRAST Result Date: 06/12/2023 CLINICAL DATA:  Initial evaluation for possible osteomyelitis. History of bacteremia, back pain. EXAM: MRI LUMBAR SPINE WITHOUT CONTRAST TECHNIQUE: Multiplanar, multisequence MR imaging of the lumbar spine was performed. No intravenous contrast was administered. COMPARISON:  Comparison made with prior CT from 06/10/2023. FINDINGS: Segmentation: Standard. Lowest well-formed disc space labeled the L5-S1 level. Alignment: Trace 2 mm anterolisthesis of L4 on L5. Alignment otherwise normal with preservation of the normal lumbar lordosis. Vertebrae: Complete obliteration of the L5-S1 interspace with bony ankylosis the L5 and S1 vertebral bodies. No associated abnormal marrow edema at this level. No worrisome or aggressive osseous lesions seen at this location on prior CT. Finding is nonspecific, and could be related to prior surgery or possibly remote infection. Again, no associated marrow edema to suggest acute infection at this time. No other evidence for acute infection elsewhere within  the lumbar spine. Vertebral body height maintained. Bone marrow signal intensity within normal limits. Few scattered benign hemangiomata noted. No worrisome osseous lesions. Conus medullaris and cauda equina: Conus extends to the L1 level. Conus and cauda equina appear normal. No epidural collections. Paraspinal and other soft tissues: Paraspinous soft tissues demonstrate no acute finding. Multifocal cortical scarring noted about the visualized kidneys. Disc levels: L1-2: Disc desiccation with mild diffuse disc bulge. Mild reactive endplate spurring. Mild bilateral facet hypertrophy. No spinal stenosis. Foramina remain patent. L2-3: Disc desiccation with mild disc bulge. Mild reactive endplate spurring. Mild to moderate facet and ligament flavum hypertrophy. No significant spinal stenosis. Mild left greater than right L2 foraminal stenosis. L3-4: Disc desiccation with mild disc bulge. Moderate bilateral facet hypertrophy. No more than mild spinal stenosis. Mild to moderate left with mild right L3 foraminal narrowing. L4-5: Trace anterolisthesis. Disc desiccation with mild disc bulge. Severe bilateral facet arthrosis. Resultant moderate spinal stenosis. Mild bilateral L4 foraminal narrowing. 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 Associated mild endplate spurring, slightly asymmetric to the right. Prior right hemi laminectomy. Mild to moderate left greater than right facet hypertrophy. No spinal stenosis. Mild to moderate bilateral L5 foraminal narrowing. IMPRESSION: 1. No MRI evidence for acute infection within the lumbar spine. 2. 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 Finding is nonspecific, and could be related to prior surgery or possibly remotely healed infection. 3. Multifactorial degenerative changes at L4-5 with resultant moderate spinal stenosis. Mild to moderate bilateral L2  through L5 foraminal narrowing as above. Electronically Signed   By: Morene Hoard M.D.   On: 06/12/2023 22:38   MR THORACIC SPINE WO CONTRAST Result Date: 06/12/2023 CLINICAL DATA:  Initial evaluation for possible osteomyelitis, bacteremia with back pain. EXAM: MRI THORACIC SPINE WITHOUT CONTRAST TECHNIQUE: Multiplanar, multisequence MR imaging of the thoracic spine was performed. No intravenous contrast was administered. COMPARISON:  Comparison made with prior CT from 06/10/2023. FINDINGS: Alignment: Mild exaggeration of the normal thoracic kyphosis. Trace stepwise degenerative anterolisthesis of T1 on T2 through T3 on T4. Vertebrae: Vertebral body height maintained without acute or chronic fracture. Bone marrow signal intensity within normal limits. Subcentimeter benign hemangioma noted within the T6 vertebral body. No worrisome osseous lesions. No findings to suggest osteomyelitis discitis or septic arthritis.  Cord:  Normal signal and morphology.  No epidural collections. Paraspinal and other soft tissues: Paraspinous soft tissues demonstrate no acute finding. Moderate to large layering bilateral pleural effusions noted. Disc levels: Or narrowing for age multilevel disc desiccation with mild degenerative endplate spurring noted throughout the thoracic spine. No significant disc bulge or focal disc herniation. No spinal stenosis. Foramina remain patent. No neural impingement. IMPRESSION: 1. No MRI evidence for infection within the thoracic spine. 2. Ordinary for age multilevel thoracic spondylosis without significant stenosis or neural impingement. 3. Moderate to large layering bilateral pleural effusions. Electronically Signed   By: Morene Hoard M.D.   On: 06/12/2023 22:24   ECHO TEE Result Date: 06/11/2023    TRANSESOPHOGEAL ECHO REPORT   Patient Name:   Tina Peters Date of Exam: 06/11/2023 Medical Rec #:  [id number]       Height:       65.0 in Accession #:    [id number]      Weight:        224.9 lb Date of Birth:  01-03-1951       BSA:          2.079 m Patient Age:    72 years        BP:           95/59 mmHg Patient Gender: F               HR:           67 bpm. 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 Risk Factors:Hypertension. S/P Bovine mitral                  valve replacement.                   Mitral Valve: bioprosthetic valve valve is present in the                  mitral position.  Sonographer:     Christopher Furnace Referring Phys:  [id number] CHRI[address] RONALD BERGE Diagnosing Phys: Evalene Lunger MD PROCEDURE: After discussion of the risks and benefits of a TEE, an informed consent was obtained from the patient. TEE procedure time was 30 minutes. The transesophogeal probe was passed without difficulty through the esophogus of the patient. Local oropharyngeal anesthetic was provided with Cetacaine  and viscous lidocaine . Sedation performed by different physician. Image quality was excellent. The patient's vital signs; including heart rate, blood pressure, and oxygen saturation; remained stable throughout the procedure. The patient developed no complications during the procedure.  IMPRESSIONS  1. Left ventricular ejection fraction, by estimation, is 55 to 60%. The left ventricle has normal function. The left ventricle has no regional wall motion abnormalities.  2. Right ventricular systolic function is mildly reduced. The right ventricular size is normal.  3. Left atrial size was severely dilated. No left atrial/left atrial appendage thrombus was detected.  4. Right atrial size was mildly dilated.  5. The mitral valve has been repaired/replaced. No evidence of mitral valve regurgitation. Mild mitral stenosis. The mean mitral valve gradient  is 7.0  mmHg. There is a bioprosthetic valve present in the mitral position. Echo findings are consistent with  vegetation of the posterior mitral prosthesis, with significant thickening and a small mobile opacity coming from the posterior leaflet.  6. Tricuspid valve regurgitation is moderate.  7. The aortic valve is normal in structure. There is mild calcification of the aortic valve. Aortic valve regurgitation is not visualized. Aortic valve sclerosis is present, with no evidence of aortic valve stenosis.  8. The inferior vena cava is normal in size with greater than 50% respiratory variability, suggesting right atrial pressure of 3 mmHg.  9. Agitated saline contrast bubble study was negative, with no evidence of any interatrial shunt. Conclusion(s)/Recommendation(s): Thickening of MV posterior leflet with mobile vegetation noted FINDINGS  Left Ventricle: Left ventricular ejection fraction, by estimation, is 55 to 60%. The left ventricle has normal function. The left ventricle has no regional wall motion abnormalities. The left ventricular internal cavity size was normal in size. There is  no left ventricular hypertrophy. Right Ventricle: The right ventricular size is normal. No increase in right ventricular wall thickness. Right ventricular systolic function is mildly reduced. Left Atrium: Left atrial size was severely dilated. No left atrial/left atrial appendage thrombus was detected. Right Atrium: Right atrial size was mildly dilated. Pericardium: There is no evidence of pericardial effusion. Mitral Valve: The mitral valve has been repaired/replaced. There is severe thickening of the mitral valve leaflet(s). No evidence of mitral valve regurgitation. There is a bioprosthetic valve present in the mitral position. Echo findings are consistent with vegetation on the mitral prosthesis. Mild mitral valve stenosis. MV peak gradient, 22.3 mmHg. The mean mitral valve gradient is 7.0 mmHg. Tricuspid Valve: The  tricuspid valve is normal in structure. Tricuspid valve regurgitation is moderate . No evidence of tricuspid stenosis. Aortic Valve: The aortic valve is normal in structure. There is mild calcification of the aortic valve. Aortic valve regurgitation is not visualized. Aortic valve sclerosis is present, with no evidence of aortic valve stenosis. Pulmonic Valve: The pulmonic valve was normal in structure. Pulmonic valve regurgitation is not visualized. No evidence of pulmonic stenosis. Aorta: The aortic root is normal in size and structure. Venous: The inferior vena cava is normal in size with greater than 50% respiratory variability, suggesting right atrial pressure of 3 mmHg. IAS/Shunts: No atrial level shunt detected by color flow Doppler. Agitated saline contrast was given intravenously to evaluate for intracardiac shunting. Agitated saline contrast bubble study was negative, with no evidence of any interatrial shunt. There  is no evidence of a patent foramen ovale. There is no evidence of an atrial septal defect.  MITRAL VALVE MV Area (PHT): 1.46 cm MV Peak grad:  22.3 mmHg MV Mean grad:  7.0 mmHg MV Vmax:       2.36 m/s MV Vmean:      107.0 cm/s MV Decel Time: 518 msec MV E velocity: 198.00 cm/s MV A velocity: 41.20 cm/s MV E/A ratio:  4.81 Evalene Lunger MD Electronically signed by Evalene Lunger MD Signature Date/Time: 06/11/2023/4:44:40 PM    Final         Laneta Blunt, DO [facility] 06/14/2023, 1:53 PM    Dictation software may have been used to generate the above note. Typos may occur and escape review in typed/dictated notes. Please contact Dr Blunt directly for clarity if needed.  Staff may message me via secure chat in Epic  but this may not receive an immediate response,  please page me for urgent matters!  If 7PM-7AM, please contact night coverage [email or web address]

## 2023-06-15 DIAGNOSIS — I330 Acute and subacute infective endocarditis: Secondary | ICD-10-CM | POA: Diagnosis not present

## 2023-06-15 DIAGNOSIS — T826XXA Infection and inflammatory reaction due to cardiac valve prosthesis, initial encounter: Secondary | ICD-10-CM

## 2023-06-15 DIAGNOSIS — N179 Acute kidney failure, unspecified: Secondary | ICD-10-CM | POA: Diagnosis not present

## 2023-06-15 DIAGNOSIS — I4891 Unspecified atrial fibrillation: Secondary | ICD-10-CM | POA: Diagnosis not present

## 2023-06-15 DIAGNOSIS — K529 Noninfective gastroenteritis and colitis, unspecified: Secondary | ICD-10-CM | POA: Diagnosis not present

## 2023-06-15 DIAGNOSIS — R7881 Bacteremia: Secondary | ICD-10-CM | POA: Diagnosis not present

## 2023-06-15 DIAGNOSIS — B9689 Other specified bacterial agents as the cause of diseases classified elsewhere: Secondary | ICD-10-CM | POA: Diagnosis not present

## 2023-06-15 DIAGNOSIS — N189 Chronic kidney disease, unspecified: Secondary | ICD-10-CM

## 2023-06-15 DIAGNOSIS — R7989 Other specified abnormal findings of blood chemistry: Secondary | ICD-10-CM | POA: Diagnosis not present

## 2023-06-15 DIAGNOSIS — I059 Rheumatic mitral valve disease, unspecified: Secondary | ICD-10-CM | POA: Diagnosis not present

## 2023-06-15 LAB — CBC
HCT: 29 % — ABNORMAL LOW (ref 36.0–46.0)
Hemoglobin: 9.1 g/dL — ABNORMAL LOW (ref 12.0–15.0)
MCH: 26.9 pg (ref 26.0–34.0)
MCHC: 31.4 g/dL (ref 30.0–36.0)
MCV: 85.8 fL (ref 80.0–100.0)
Platelets: 200 10*3/uL (ref 150–400)
RBC: 3.38 MIL/uL — ABNORMAL LOW (ref 3.87–5.11)
RDW: 17.6 % — ABNORMAL HIGH (ref 11.5–15.5)
WBC: 12.7 10*3/uL — ABNORMAL HIGH (ref 4.0–10.5)
nRBC: 0.7 % — ABNORMAL HIGH (ref 0.0–0.2)

## 2023-06-15 LAB — BASIC METABOLIC PANEL
Anion gap: 10 (ref 5–15)
BUN: 22 mg/dL (ref 8–23)
CO2: 20 mmol/L — ABNORMAL LOW (ref 22–32)
Calcium: 8.3 mg/dL — ABNORMAL LOW (ref 8.9–10.3)
Chloride: 104 mmol/L (ref 98–111)
Creatinine, Ser: 2.19 mg/dL — ABNORMAL HIGH (ref 0.44–1.00)
GFR, Estimated: 23 mL/min — ABNORMAL LOW (ref >60)
Glucose, Bld: 104 mg/dL — ABNORMAL HIGH (ref 70–99)
Potassium: 4.1 mmol/L (ref 3.5–5.1)
Sodium: 134 mmol/L — ABNORMAL LOW (ref 135–145)

## 2023-06-15 LAB — CULTURE, BLOOD (SINGLE): Culture: NO GROWTH

## 2023-06-15 LAB — HEPARIN LEVEL (UNFRACTIONATED): Heparin Unfractionated: 0.59 [IU]/mL (ref 0.30–0.70)

## 2023-06-15 MED ORDER — METOPROLOL TARTRATE 50 MG PO TABS
50.0000 mg | ORAL_TABLET | Freq: Two times a day (BID) | ORAL | Status: DC
  Administered 2023-06-15 – 2023-06-17 (×5): 50 mg via ORAL
  Filled 2023-06-15 (×5): qty 1

## 2023-06-15 NOTE — Progress Notes (Signed)
 PROGRESS NOTE    Tina Peters   FMW:[id number] DOB: [birth year]  DOA: 06/09/2023 Date of Service: 06/15/23 which is hospital day 5  PCP: Myrla Jon HERO, MD    Hospital course / significant events:   HPI: 73 year old female history of A-fib on Xarelto , hypertension, CKD stage IIIa, prior history of stroke, history of systolic heart failure EF of 45%, hypertension, presents to the ER with a 1-week history of nausea, vomiting and diarrhea.  Patient states that she got sick last week.  Was seen in the ER on January 26.  She had palpitations that time.  Workup was negative.  Patient discharged to home.  She followed up in the office and was diagnosed with flulike symptoms.  She had a respiratory viral panel that was negative.  She continued to have diarrhea along with nausea and vomiting.  She states that she felt some tightness in her chest and her abdomen.  She has been vomiting.  She felt some palpitations in the middle of the night.  She came to the ER 02/03 for evaluation.  02/03: to ED evening - elevated BNP, procalcitonin, troponin 283 --> 331 started on heparin , lactate 2.6 -(1L IVF)--> 1.7, started on broad spectrum abx cefepime  + vanc + flagyl .  02/04: admitted to hospitalist service w/ concern for gastroenteritis w/ concern for associated sepsis (reasonably ruled out UTI, PNA, colitis or other intraabdominal bacterial infection).  02/05: unfortunately (+)TEE for mitral valve vegetation.  Blood culture (+)gram positive cocci in anaerobic bottle. ID consulted --> await final BCx results, continue ceftriaxone  + flagyl , checking CDiff and may need colonoscopy, recs for MRI spine d/t pain/bacteremia. Pt reports diarrhea has improved but still present. CDiff neg. Presented in sinus rhythm with development of A-fib at 21:00 on 06/11/2023, remains in A-fib with ventricular rates largely in the low 100s bpm with brief episodes into the 120s to 140s bpm  02/06: cardiology adjusting amio and beta  blocker. MRI T/L spine: no findings to indicate osteomyelitis/discitis. GPC culture reincubated for better growth 02/07: repeat BCx draw this morning   02/08-02/09: no growth thus far on new cultures.. Await final abx recs and outpatient abx / PICC expect for Mon 02/10      Consultants:  Cardiology  Infectious disease   Procedures/Surgeries: TEE 06/11/23       ASSESSMENT & PLAN:   Sepsis with acute organ dysfunction without septic shock  Initially thought d/t GI illness, now w/ dx endocarditis, sepsis likely multifactorial   Endocarditis  Mitral valve endocarditis, s/p bioprosthetic AVR in 10/2021  Gram positive cocci bacteremia  Confirmed on TEE 02/05 showing mobile opacity on posterior leaflet mitral valve prosthetic valve.  Infection likely caught relatively early given no significant valve regurgitation, grossly no abscess formation though will need to be monitored closely  Hx: June 2023 admitted to [facility] Streptococcus sanguinous bacteremia from infected tooth with mitral valve vegetation, at that time underwent mitral valve replacement with bioprosthetic valve MRI spine L and T spine given back pain in both areas --> no evidence osteo/discitis CT maxillofacial no dental abscess  ID following Expect long-term IV Abx, PICC line Following for BCx ID/Susc  Following second set BCx Cardiology following, tentative plan for repeat TEE in 6 weeks to reevaluate mitral valve  Expect will need CT surgery consultation   Anemia No s/s bleeding Caution on heparin  Monitor Hgb/CBC  Acute gastroenteritis - improving  Clinically, concern for norovirus vs other viral illness, CDiff engative  check stool studies --> negative /  nothing detected  Enteric/contact precautions have been dc as sx improved as well Diet adv as tolerated     Back pain Ruled out osteomyelitis/discitis Tylenol  prn mild Tramadol  prn mod-severe Fentanyl  IV prn breakthrough Pt educated on goal to avoid IV pain  meds if at all able   Demand ischemia  Elevated troponin Favor demand ischarmia over NSTEMI Monitoring for chest pain   Hypokalemia D/t diarrhea Replace as needed Monitor BMP   Chronic paroxysmal A-fib  On admission, EKG shows normal sinus rhythm.   Had more rapid rate here transiently Echo this admission with preserved LV systolic function and no focal wall motion abnormality  Xarelto  for anticoagulation at home - held while on heparin  (in case needing further procedure but anticipate xarelto  prior to dc) Reloaded w/ amiodarone  po  Toprol  switched to lopressor  - cardiology is titrating this  Cardiology following    On amiodarone  Thyroid  panel per cardiology   History HFrEF (heart failure with reduced ejection fraction) NOT in exacerbation  On admission, patient has actually intra vascular volume depleted.   Echo this admission with preserved LV systolic function and no focal wall motion abnormality  Cardiology following    Stage 3a chronic kidney disease (HCC) - Baseline creatinine 1.1-1.4 On admission, patient has a history of CKD stage IIIa.  Baseline creatinine 1.1-1.4.  Cr on admission is close to baseline, no AKI Hold her Aldactone  and her losartan  for now.   OSA (obstructive sleep apnea) On admission continue CPAP   Essential hypertension Home blood pressure medications include Aldactone , Toprol -XL, Cozaar .   hold her Cozaar  and Aldactone  due to dehydration.   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  Underweight - under 18  overweight - 25 to 29 obese - 30 or more Class 1 obesity: BMI of 30.0 to 34 Class 2 obesity: BMI of 35.0 to 39 Class 3 obesity: BMI of 40.0 to 49 Super Morbid Obesity: BMI 50-59 Super-super Morbid Obesity: BMI 60+ Significantly low or high BMI is  associated with higher medical risk.  Weight management advised as adjunct to other disease management and risk reduction treatments    DVT prophylaxis: heparin  IV fluids: no continuous IV fluids  Nutrition: regular diet Central lines / invasive devices: none  Code Status: FULL CODE ACP documentation reviewed:  none on file in [facility]  TOC needs: home health, PT/OT and IV abx  Barriers to dispo / significant pending items: IV abx, PICC line, repeat BCx, expect will be here couple more days pending ID clearance and abx plan prior to discharge but expect this can be in place on Monday pending further complications and pending second blood cultures              Subjective / Brief ROS:  Patient reports no concerns today  Denies pain Tolerating diet Ambulating independently   Family Communication: no new updates at this time     Objective Findings:  Vitals:   06/14/23 2353 06/15/23 0345 06/15/23 0849 06/15/23 1233  BP: 125/67 101/85 132/77 122/70  Pulse: 73 95 96 81  Resp: 19 18  20   Temp: 98.7 F (37.1 C) 98.2 F (36.8 C) 98.6 F (37 C) 98.4 F (36.9 C)  TempSrc: Oral Oral  Oral  SpO2: 95% 98% 97% 96%  Weight:  Height:        Intake/Output Summary (Last 24 hours) at 06/15/2023 1255 Last data filed at 06/15/2023 1030 Gross per 24 hour  Intake 1417.87 ml  Output 300 ml  Net 1117.87 ml   Filed Weights   06/09/23 2135 06/11/23 1205  Weight: 102 kg 102 kg    Examination:  Physical Exam Constitutional:      General: She is not in acute distress. Cardiovascular:     Rate and Rhythm: Normal rate and regular rhythm.  Pulmonary:     Effort: Pulmonary effort is normal.     Breath sounds: Normal breath sounds.  Neurological:     General: No focal deficit present.     Mental Status: She is alert and oriented to person, place, and time. Mental status is at baseline.  Psychiatric:        Mood and Affect: Mood normal.        Behavior: Behavior normal.           Scheduled Medications:   amiodarone   400 mg Oral BID   aspirin  EC  81 mg Oral Daily   metoprolol  tartrate  50 mg Oral BID   sertraline   50 mg Oral Daily    Continuous Infusions:  cefTRIAXone  (ROCEPHIN )  IV Stopped (06/15/23 0026)   heparin  2,100 Units/hr (06/15/23 0700)    PRN Medications:  cyclobenzaprine , fentaNYL  (SUBLIMAZE ) injection, ondansetron  **OR** ondansetron  (ZOFRAN ) IV, traMADol   Antimicrobials from admission:  Anti-infectives (From admission, onward)    Start     Dose/Rate Route Frequency Ordered Stop   06/11/23 2200  metroNIDAZOLE  (FLAGYL ) IVPB 500 mg  Status:  Discontinued        500 mg 100 mL/hr over 60 Minutes Intravenous Every 12 hours 06/11/23 1628 06/13/23 1541   06/11/23 0015  cefTRIAXone  (ROCEPHIN ) 2 g in sodium chloride  0.9 % 100 mL IVPB        2 g 200 mL/hr over 30 Minutes Intravenous Every 24 hours 06/10/23 2320     06/11/23 0000  metroNIDAZOLE  (FLAGYL ) tablet 500 mg  Status:  Discontinued        500 mg Oral Every 12 hours 06/10/23 2320 06/11/23 1628   06/09/23 2245  ceFEPIme  (MAXIPIME ) 2 g in sodium chloride  0.9 % 100 mL IVPB        2 g 200 mL/hr over 30 Minutes Intravenous  Once 06/09/23 2238 06/09/23 2324   06/09/23 2245  metroNIDAZOLE  (FLAGYL ) IVPB 500 mg        500 mg 100 mL/hr over 60 Minutes Intravenous  Once 06/09/23 2238 06/10/23 0035   06/09/23 2245  vancomycin  (VANCOCIN ) IVPB 1000 mg/200 mL premix        1,000 mg 200 mL/hr over 60 Minutes Intravenous  Once 06/09/23 2238 06/10/23 0035           Data Reviewed:  I have personally reviewed the following...  CBC: Recent Labs  Lab 06/09/23 2153 06/10/23 1531 06/11/23 9364 06/12/23 0723 06/13/23 0206 06/14/23 0341 06/15/23 0342  WBC 43.3* 22.2* 16.8* 12.0* 14.7* 12.7* 12.7*  NEUTROABS 38.8* 20.1* 13.8*  --   --   --   --   HGB 10.2* 9.4* 8.5* 9.1* 10.2* 8.9* 9.1*  HCT 31.4* 29.7* 27.5* 28.5* 33.0* 28.1* 29.0*  MCV 82.2 84.9 84.1 83.6 86.2 84.1 85.8  PLT 186  178 160 160 192 168 200   Basic Metabolic Panel: Recent Labs  Lab 06/09/23 2153 06/10/23 1531 06/11/23 0635 06/14/23 0341 06/14/23 0344 06/15/23 0342  NA  133* 137 135 137 135 134*  K 2.8* 3.7 3.6 3.8 3.8 4.1  CL 97* 103 106 106 105 104  CO2 20* 24 22 21* 21* 20*  GLUCOSE 139* 130* 99 111* 111* 104*  BUN 17 15 13 21 22 22   CREATININE 1.14* 1.12* 1.02* 2.42* 2.45* 2.19*  CALCIUM  8.1* 7.9* 7.8* 8.2* 8.0* 8.3*  MG 1.8 2.3  --  2.1  --   --   PHOS  --   --   --   --  4.3  --    GFR: Estimated Creatinine Clearance: 27.5 mL/min (A) (by C-G formula based on SCr of 2.19 mg/dL (H)). Liver Function Tests: Recent Labs  Lab 06/09/23 2153 06/11/23 0635 06/14/23 0344  AST 53* 27  --   ALT 27 19  --   ALKPHOS 107 77  --   BILITOT 1.8* 1.0  --   PROT 6.8 5.7*  --   ALBUMIN 2.9* 2.4* 2.6*   Recent Labs  Lab 06/09/23 2153  LIPASE 22   No results for input(s): AMMONIA in the last 168 hours. Coagulation Profile: Recent Labs  Lab 06/10/23 0359  INR 1.6*   Cardiac Enzymes: No results for input(s): CKTOTAL, CKMB, CKMBINDEX, TROPONINI in the last 168 hours. BNP (last 3 results) No results for input(s): PROBNP in the last 8760 hours. HbA1C: No results for input(s): HGBA1C in the last 72 hours. CBG: No results for input(s): GLUCAP in the last 168 hours. Lipid Profile: No results for input(s): CHOL, HDL, LDLCALC, TRIG, CHOLHDL, LDLDIRECT in the last 72 hours. Thyroid  Function Tests: No results for input(s): TSH, T4TOTAL, FREET4, T3FREE, THYROIDAB in the last 72 hours.  Anemia Panel: No results for input(s): VITAMINB12, FOLATE, FERRITIN, TIBC, IRON, RETICCTPCT in the last 72 hours. Most Recent Urinalysis On File:     Component Value Date/Time   COLORURINE YELLOW (A) 06/09/2023 2153   APPEARANCEUR CLEAR (A) 06/09/2023 2153   LABSPEC 1.028 06/09/2023 2153   PHURINE 6.0 06/09/2023 2153   GLUCOSEU NEGATIVE 06/09/2023 2153    HGBUR SMALL (A) 06/09/2023 2153   BILIRUBINUR NEGATIVE 06/09/2023 2153   KETONESUR NEGATIVE 06/09/2023 2153   PROTEINUR NEGATIVE 06/09/2023 2153   NITRITE NEGATIVE 06/09/2023 2153   LEUKOCYTESUR NEGATIVE 06/09/2023 2153   Sepsis Labs: @LABRCNTIP (procalcitonin:4,lacticidven:4) Microbiology: Recent Results (from the past 240 hours)  Gastrointestinal Panel by PCR , Stool     Status: None   Collection Time: 06/09/23 12:42 PM   Specimen: Stool  Result Value Ref Range Status   Campylobacter species NOT DETECTED NOT DETECTED Final   Plesimonas shigelloides NOT DETECTED NOT DETECTED Final   Salmonella species NOT DETECTED NOT DETECTED Final   Yersinia enterocolitica NOT DETECTED NOT DETECTED Final   Vibrio species NOT DETECTED NOT DETECTED Final   Vibrio cholerae NOT DETECTED NOT DETECTED Final   Enteroaggregative E coli (EAEC) NOT DETECTED NOT DETECTED Final   Enteropathogenic E coli (EPEC) NOT DETECTED NOT DETECTED Final   Enterotoxigenic E coli (ETEC) NOT DETECTED NOT DETECTED Final   Shiga like toxin producing E coli (STEC) NOT DETECTED NOT DETECTED Final   Shigella/Enteroinvasive E coli (EIEC) NOT DETECTED NOT DETECTED Final   Cryptosporidium NOT DETECTED NOT DETECTED Final   Cyclospora cayetanensis NOT DETECTED NOT DETECTED Final   Entamoeba histolytica NOT DETECTED NOT DETECTED Final   Giardia lamblia NOT DETECTED NOT DETECTED Final   Adenovirus F40/41 NOT DETECTED NOT DETECTED Final   Astrovirus NOT DETECTED NOT DETECTED Final   Norovirus GI/GII NOT DETECTED  NOT DETECTED Final   Rotavirus A NOT DETECTED NOT DETECTED Final   Sapovirus (I, II, IV, and V) NOT DETECTED NOT DETECTED Final    Comment: Performed at [facility], [address] [place], [place] [address]  Resp panel by RT-PCR (RSV, Flu A&B, Covid) Anterior Nasal Swab     Status: None   Collection Time: 06/09/23  9:53 PM   Specimen: Anterior Nasal Swab  Result Value Ref Range Status   SARS Coronavirus 2 by  RT PCR NEGATIVE NEGATIVE Final    Comment: (NOTE) SARS-CoV-2 target nucleic acids are NOT DETECTED.  The SARS-CoV-2 RNA is generally detectable in upper respiratory specimens during the acute phase of infection. The lowest concentration of SARS-CoV-2 viral copies this assay can detect is 138 copies/mL. A negative result does not preclude SARS-Cov-2 infection and should not be used as the sole basis for treatment or other patient management decisions. A negative result may occur with  improper specimen collection/handling, submission of specimen other than nasopharyngeal swab, presence of viral mutation(s) within the areas targeted by this assay, and inadequate number of viral copies(<138 copies/mL). A negative result must be combined with clinical observations, patient history, and epidemiological information. The expected result is Negative.  Fact Sheet for Patients:  bloggercourse.com  Fact Sheet for Healthcare Providers:  seriousbroker.it  This test is no t yet approved or cleared by the [place]  FDA and  has been authorized for detection and/or diagnosis of SARS-CoV-2 by FDA under an Emergency Use Authorization (EUA). This EUA will remain  in effect (meaning this test can be used) for the duration of the COVID-19 declaration under Section 564(b)(1) of the Act, 21 U.S.C.section 360bbb-3(b)(1), unless the authorization is terminated  or revoked sooner.       Influenza A by PCR NEGATIVE NEGATIVE Final   Influenza B by PCR NEGATIVE NEGATIVE Final    Comment: (NOTE) The Xpert Xpress SARS-CoV-2/FLU/RSV plus assay is intended as an aid in the diagnosis of influenza from Nasopharyngeal swab specimens and should not be used as a sole basis for treatment. Nasal washings and aspirates are unacceptable for Xpert Xpress SARS-CoV-2/FLU/RSV testing.  Fact Sheet for Patients: bloggercourse.com  Fact Sheet  for Healthcare Providers: seriousbroker.it  This test is not yet approved or cleared by the [place]  FDA and has been authorized for detection and/or diagnosis of SARS-CoV-2 by FDA under an Emergency Use Authorization (EUA). This EUA will remain in effect (meaning this test can be used) for the duration of the COVID-19 declaration under Section 564(b)(1) of the Act, 21 U.S.C. section 360bbb-3(b)(1), unless the authorization is terminated or revoked.     Resp Syncytial Virus by PCR NEGATIVE NEGATIVE Final    Comment: (NOTE) Fact Sheet for Patients: bloggercourse.com  Fact Sheet for Healthcare Providers: seriousbroker.it  This test is not yet approved or cleared by the [place]  FDA and has been authorized for detection and/or diagnosis of SARS-CoV-2 by FDA under an Emergency Use Authorization (EUA). This EUA will remain in effect (meaning this test can be used) for the duration of the COVID-19 declaration under Section 564(b)(1) of the Act, 21 U.S.C. section 360bbb-3(b)(1), unless the authorization is terminated or revoked.  Performed at [facility], [address] [place], [place] [address]   Culture, blood (single)     Status: None (Preliminary result)   Collection Time: 06/09/23 10:59 PM   Specimen: BLOOD LEFT ARM  Result Value Ref Range Status   Specimen Description   Final  BLOOD LEFT ARM Performed at [facility], [address], [place], [place] [address]    Special Requests   Final    BOTTLES DRAWN AEROBIC AND ANAEROBIC Blood Culture results may not be optimal due to an inadequate volume of blood received in culture bottles Performed at [facility], [address] [place], [place] [address]    Culture  Setup Time   Final    GRAM POSITIVE COCCI IN BOTH AEROBIC AND ANAEROBIC BOTTLES CRITICAL RESULT CALLED TO, READ BACK BY AND VERIFIED WITH: SHEEMA  HALLAJI PHARMD 0911 06/11/23 HNM GRAM STAIN REVIEWED-AGREE WITH RESULT DRT    Culture   Final    CORRECTED RESULTS GAMELLA HAEMOLYSANS PREVIOUSLY REPORTED AS: GAMMA HEMOLYSIS CORRECTED RESULTS CALLED TO: DR FREDIA FRESH [id number] AT 1524, ADC ISOLATE REFERED FOR SUSCEPTIBILITY Performed at [facility], [address] [place], [place] [address]    Report Status PENDING  Incomplete  Blood Culture ID Panel (Reflexed)     Status: None   Collection Time: 06/09/23 10:59 PM  Result Value Ref Range Status   Enterococcus faecalis NOT DETECTED NOT DETECTED Final   Enterococcus Faecium NOT DETECTED NOT DETECTED Final   Listeria monocytogenes NOT DETECTED NOT DETECTED Final   Staphylococcus species NOT DETECTED NOT DETECTED Final   Staphylococcus aureus (BCID) NOT DETECTED NOT DETECTED Final   Staphylococcus epidermidis NOT DETECTED NOT DETECTED Final   Staphylococcus lugdunensis NOT DETECTED NOT DETECTED Final   Streptococcus species NOT DETECTED NOT DETECTED Final   Streptococcus agalactiae NOT DETECTED NOT DETECTED Final   Streptococcus pneumoniae NOT DETECTED NOT DETECTED Final   Streptococcus pyogenes NOT DETECTED NOT DETECTED Final   A.calcoaceticus-baumannii NOT DETECTED NOT DETECTED Final   Bacteroides fragilis NOT DETECTED NOT DETECTED Final   Enterobacterales NOT DETECTED NOT DETECTED Final   Enterobacter cloacae complex NOT DETECTED NOT DETECTED Final   Escherichia coli NOT DETECTED NOT DETECTED Final   Klebsiella aerogenes NOT DETECTED NOT DETECTED Final   Klebsiella oxytoca NOT DETECTED NOT DETECTED Final   Klebsiella pneumoniae NOT DETECTED NOT DETECTED Final   Proteus species NOT DETECTED NOT DETECTED Final   Salmonella species NOT DETECTED NOT DETECTED Final   Serratia marcescens NOT DETECTED NOT DETECTED Final   Haemophilus influenzae NOT DETECTED NOT DETECTED Final   Neisseria meningitidis NOT DETECTED NOT DETECTED Final   Pseudomonas aeruginosa NOT DETECTED NOT  DETECTED Final   Stenotrophomonas maltophilia NOT DETECTED NOT DETECTED Final   Candida albicans NOT DETECTED NOT DETECTED Final   Candida auris NOT DETECTED NOT DETECTED Final   Candida glabrata NOT DETECTED NOT DETECTED Final   Candida krusei NOT DETECTED NOT DETECTED Final   Candida parapsilosis NOT DETECTED NOT DETECTED Final   Candida tropicalis NOT DETECTED NOT DETECTED Final   Cryptococcus neoformans/gattii NOT DETECTED NOT DETECTED Final    Comment: Performed at [facility], [address] [place], [place] [address]  Blood culture (single)     Status: None   Collection Time: 06/10/23 12:53 AM   Specimen: BLOOD  Result Value Ref Range Status   Specimen Description BLOOD RIGHT ARM  Final   Special Requests   Final    BOTTLES DRAWN AEROBIC AND ANAEROBIC Blood Culture results may not be optimal due to an inadequate volume of blood received in culture bottles   Culture   Final    NO GROWTH 5 DAYS Performed at [facility], [address], [place], [place] [address]    Report Status  06/15/2023 FINAL  Final  C Difficile Quick Screen w PCR reflex     Status: None   Collection Time: 06/11/23  9:28 PM   Specimen: STOOL  Result Value Ref Range Status   C Diff antigen NEGATIVE NEGATIVE Final   C Diff toxin NEGATIVE NEGATIVE Final   C Diff interpretation No C. difficile detected.  Final    Comment: Performed at [facility], [address] [place], [place] [address]  Culture, blood (Routine X 2) w Reflex to ID Panel     Status: None (Preliminary result)   Collection Time: 06/13/23  6:26 AM   Specimen: BLOOD LEFT ARM  Result Value Ref Range Status   Specimen Description   Final    BLOOD LEFT ARM Performed at [facility], [address], [place], [place] [address]    Special Requests   Final    BOTTLES DRAWN AEROBIC AND ANAEROBIC Blood Culture adequate volume   Culture  Setup Time PENDING  Incomplete   Culture   Final    NO GROWTH 2  DAYS Performed at [facility], [address] [place], [place] [address]    Report Status PENDING  Incomplete  Culture, blood (Routine X 2) w Reflex to ID Panel     Status: None (Preliminary result)   Collection Time: 06/13/23  6:29 AM   Specimen: BLOOD  Result Value Ref Range Status   Specimen Description BLOOD BLOOD LEFT HAND  Final   Special Requests   Final    BOTTLES DRAWN AEROBIC AND ANAEROBIC Blood Culture results may not be optimal due to an inadequate volume of blood received in culture bottles   Culture   Final    NO GROWTH 2 DAYS Performed at [facility], [address], [place], [place] [address]    Report Status PENDING  Incomplete      Radiology Studies last 3 days: CT MAXILLOFACIAL WO CONTRAST Result Date: 06/13/2023 CLINICAL DATA:  Family concerned for dental abscess. History of dental disease causing endocarditis. EXAM: CT MAXILLOFACIAL WITHOUT CONTRAST TECHNIQUE: Multidetector CT imaging of the maxillofacial structures was performed. Multiplanar CT image reconstructions were also generated. RADIATION DOSE REDUCTION: This exam was performed according to the departmental dose-optimization program which includes automated exposure control, adjustment of the mA and/or kV according to patient size and/or use of iterative reconstruction technique. COMPARISON:  Facial CT 03/31/2023. FINDINGS: Osseous: No fracture or mandibular dislocation. No destructive process. Again noted the upper teeth have all been removed as well as the left mandibular molars and left mandibular second bicuspid. There is metallic artifact from fillings in the right mandibular molars. There are no periapical lucencies around the roots of the remaining teeth and no advanced decay is seen. Orbits: Negative. No traumatic or inflammatory finding. 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 Small retention cyst or polyp posteriorly in the left maxillary sinus. Remaining paranasal  sinuses, bilateral mastoid air cells, and bilateral middle ears are clear. The nasal passages and both ostiomeatal complexes are patent. There is an S shaped nasal septum with left-sided spurring. Soft tissues: No abscess or focal inflammatory process is seen. Limited intracranial: Mild cerebral atrophy and small-vessel disease. Calcific plaque in the carotid siphons. No acute findings involving the visualized brain. IMPRESSION: 1. No evidence of dental abscess or advanced decay. 2. No acute facial bone findings. 3. Chronic right frontal sinus osteoma. 4. Small retention cyst or polyp posteriorly in the left maxillary sinus. 5. S shaped  nasal septum with left-sided spurring. 6. Mild cerebral atrophy and small-vessel disease. 7. Carotid atherosclerosis. Electronically Signed   By: Francis Quam M.D.   On: 06/13/2023 23:22   MR LUMBAR SPINE WO CONTRAST Result Date: 06/12/2023 CLINICAL DATA:  Initial evaluation for possible osteomyelitis. History of bacteremia, back pain. EXAM: MRI LUMBAR SPINE WITHOUT CONTRAST TECHNIQUE: Multiplanar, multisequence MR imaging of the lumbar spine was performed. No intravenous contrast was administered. COMPARISON:  Comparison made with prior CT from 06/10/2023. FINDINGS: Segmentation: Standard. Lowest well-formed disc space labeled the L5-S1 level. Alignment: Trace 2 mm anterolisthesis of L4 on L5. Alignment otherwise normal with preservation of the normal lumbar lordosis. Vertebrae: Complete obliteration of the L5-S1 interspace with bony ankylosis the L5 and S1 vertebral bodies. No associated abnormal marrow edema at this level. No worrisome or aggressive osseous lesions seen at this location on prior CT. Finding is nonspecific, and could be related to prior surgery or possibly remote infection. Again, no associated marrow edema to suggest acute infection at this time. No other evidence for acute infection elsewhere within the lumbar spine. Vertebral body height maintained. Bone  marrow signal intensity within normal limits. Few scattered benign hemangiomata noted. No worrisome osseous lesions. Conus medullaris and cauda equina: Conus extends to the L1 level. Conus and cauda equina appear normal. No epidural collections. Paraspinal and other soft tissues: Paraspinous soft tissues demonstrate no acute finding. Multifocal cortical scarring noted about the visualized kidneys. Disc levels: L1-2: Disc desiccation with mild diffuse disc bulge. Mild reactive endplate spurring. Mild bilateral facet hypertrophy. No spinal stenosis. Foramina remain patent. L2-3: Disc desiccation with mild disc bulge. Mild reactive endplate spurring. Mild to moderate facet and ligament flavum hypertrophy. No significant spinal stenosis. Mild left greater than right L2 foraminal stenosis. L3-4: Disc desiccation with mild disc bulge. Moderate bilateral facet hypertrophy. No more than mild spinal stenosis. Mild to moderate left with mild right L3 foraminal narrowing. L4-5: Trace anterolisthesis. Disc desiccation with mild disc bulge. Severe bilateral facet arthrosis. Resultant moderate spinal stenosis. Mild bilateral L4 foraminal narrowing. 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 Associated mild endplate spurring, slightly asymmetric to the right. Prior right hemi laminectomy. Mild to moderate left greater than right facet hypertrophy. No spinal stenosis. Mild to moderate bilateral L5 foraminal narrowing. IMPRESSION: 1. No MRI evidence for acute infection within the lumbar spine. 2. 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 Finding is nonspecific, and could be related to prior surgery or possibly remotely healed infection. 3. Multifactorial degenerative changes at L4-5 with resultant moderate spinal stenosis. Mild to moderate bilateral L2 through L5 foraminal narrowing as above. Electronically Signed    By: Morene Hoard M.D.   On: 06/12/2023 22:38   MR THORACIC SPINE WO CONTRAST Result Date: 06/12/2023 CLINICAL DATA:  Initial evaluation for possible osteomyelitis, bacteremia with back pain. EXAM: MRI THORACIC SPINE WITHOUT CONTRAST TECHNIQUE: Multiplanar, multisequence MR imaging of the thoracic spine was performed. No intravenous contrast was administered. COMPARISON:  Comparison made with prior CT from 06/10/2023. FINDINGS: Alignment: Mild exaggeration of the normal thoracic kyphosis. Trace stepwise degenerative anterolisthesis of T1 on T2 through T3 on T4. Vertebrae: Vertebral body height maintained without acute or chronic fracture. Bone marrow signal intensity within normal limits. Subcentimeter benign hemangioma noted within the T6 vertebral body. No worrisome osseous lesions. No findings to suggest osteomyelitis discitis or septic arthritis. Cord:  Normal signal and morphology.  No epidural collections. Paraspinal and other soft tissues: Paraspinous soft tissues demonstrate no acute finding. Moderate to large layering bilateral pleural effusions noted. Disc levels: Or narrowing for age multilevel disc desiccation with mild degenerative endplate spurring noted throughout the thoracic spine. No significant disc bulge or focal disc herniation. No spinal stenosis. Foramina remain patent. No neural impingement. IMPRESSION: 1. No MRI evidence for infection within the thoracic spine. 2. Ordinary for age multilevel thoracic spondylosis without significant stenosis or neural impingement. 3. Moderate to large layering bilateral pleural effusions. Electronically Signed   By: Morene Hoard M.D.   On: 06/12/2023 22:24   ECHO TEE Result Date: 06/11/2023    TRANSESOPHOGEAL ECHO REPORT   Patient Name:   JOCELYNE REINERTSEN Date of Exam: 06/11/2023 Medical Rec #:  [id number]       Height:       65.0 in Accession #:    [id number]      Weight:       224.9 lb Date of Birth:  09/07/1950       BSA:          2.079 m  Patient Age:    72 years        BP:           95/59 mmHg Patient Gender: F               HR:           67 bpm. 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 Risk Factors:Hypertension. S/P Bovine mitral                  valve replacement.                   Mitral Valve: bioprosthetic valve valve is present in the                  mitral position.  Sonographer:     Christopher Furnace Referring Phys:  [id number] CHRISTOPHER RONALD BERGE Diagnosing Phys: Evalene Lunger MD PROCEDURE: After discussion of the risks and benefits of a TEE, an informed consent was obtained from the patient. TEE procedure time was 30 minutes. The transesophogeal probe was passed without difficulty through the esophogus of the patient. Local oropharyngeal anesthetic was provided with Cetacaine  and viscous lidocaine . Sedation performed by different physician. Image quality was excellent. The patient's vital signs; including heart rate, blood pressure, and oxygen saturation; remained stable throughout the procedure. The patient developed no complications during the procedure.  IMPRESSIONS  1. Left ventricular ejection fraction, by estimation, is 55 to 60%. The left ventricle has normal function. The left ventricle has no regional wall motion abnormalities.  2. Right ventricular systolic function is mildly reduced. The right ventricular size is normal.  3. Left atrial size was severely dilated. No left atrial/left atrial appendage thrombus was detected.  4. Right atrial size was mildly dilated.  5. The mitral valve has been repaired/replaced. No evidence of mitral valve regurgitation. Mild mitral stenosis. The mean mitral valve gradient is 7.0 mmHg. There is a bioprosthetic valve present in  the  mitral position. Echo findings are consistent with  vegetation of the posterior mitral prosthesis, with significant thickening and a small mobile opacity coming from the posterior leaflet.  6. Tricuspid valve regurgitation is moderate.  7. The aortic valve is normal in structure. There is mild calcification of the aortic valve. Aortic valve regurgitation is not visualized. Aortic valve sclerosis is present, with no evidence of aortic valve stenosis.  8. The inferior vena cava is normal in size with greater than 50% respiratory variability, suggesting right atrial pressure of 3 mmHg.  9. Agitated saline contrast bubble study was negative, with no evidence of any interatrial shunt. Conclusion(s)/Recommendation(s): Thickening of MV posterior leflet with mobile vegetation noted FINDINGS  Left Ventricle: Left ventricular ejection fraction, by estimation, is 55 to 60%. The left ventricle has normal function. The left ventricle has no regional wall motion abnormalities. The left ventricular internal cavity size was normal in size. There is  no left ventricular hypertrophy. Right Ventricle: The right ventricular size is normal. No increase in right ventricular wall thickness. Right ventricular systolic function is mildly reduced. Left Atrium: Left atrial size was severely dilated. No left atrial/left atrial appendage thrombus was detected. Right Atrium: Right atrial size was mildly dilated. Pericardium: There is no evidence of pericardial effusion. Mitral Valve: The mitral valve has been repaired/replaced. There is severe thickening of the mitral valve leaflet(s). No evidence of mitral valve regurgitation. There is a bioprosthetic valve present in the mitral position. Echo findings are consistent with vegetation on the mitral prosthesis. Mild mitral valve stenosis. MV peak gradient, 22.3 mmHg. The mean mitral valve gradient is 7.0 mmHg. Tricuspid Valve: The tricuspid valve is normal in structure. Tricuspid valve  regurgitation is moderate . No evidence of tricuspid stenosis. Aortic Valve: The aortic valve is normal in structure. There is mild calcification of the aortic valve. Aortic valve regurgitation is not visualized. Aortic valve sclerosis is present, with no evidence of aortic valve stenosis. Pulmonic Valve: The pulmonic valve was normal in structure. Pulmonic valve regurgitation is not visualized. No evidence of pulmonic stenosis. Aorta: The aortic root is normal in size and structure. Venous: The inferior vena cava is normal in size with greater than 50% respiratory variability, suggesting right atrial pressure of 3 mmHg. IAS/Shunts: No atrial level shunt detected by color flow Doppler. Agitated saline contrast was given intravenously to evaluate for intracardiac shunting. Agitated saline contrast bubble study was negative, with no evidence of any interatrial shunt. There  is no evidence of a patent foramen ovale. There is no evidence of an atrial septal defect.  MITRAL VALVE MV Area (PHT): 1.46 cm MV Peak grad:  22.3 mmHg MV Mean grad:  7.0 mmHg MV Vmax:       2.36 m/s MV Vmean:      107.0 cm/s MV Decel Time: 518 msec MV E velocity: 198.00 cm/s MV A velocity: 41.20 cm/s MV E/A ratio:  4.81 Evalene Lunger MD Electronically signed by Evalene Lunger MD Signature Date/Time: 06/11/2023/4:44:40 PM    Final         Tina Blunt, DO [facility] 06/15/2023, 12:55 PM    Dictation software may have been used to generate the above note. Typos may occur and escape review in typed/dictated notes. Please contact Dr Peters directly for clarity if needed.  Staff may message me via secure chat in Epic  but this may not receive an immediate response,  please page me for urgent matters!  If 7PM-7AM, please contact night coverage [email or web address]

## 2023-06-15 NOTE — Progress Notes (Signed)
 Progress Note  Patient Name: Tina Peters Date of Encounter: 06/15/2023  Primary Cardiologist: Darron  Subjective   Developed Afib around 21:00 on 2/5, converted to sinus rhythm at 06:10 on 2/9. No chest pain, dyspnea, or palpitations.  Diarrhea is improved.  Hgb low, though stable at 9.1. BP stable.   Inpatient Medications    Scheduled Meds:  amiodarone   400 mg Oral BID   aspirin  EC  81 mg Oral Daily   metoprolol  tartrate  50 mg Oral BID   sertraline   50 mg Oral Daily   Continuous Infusions:  cefTRIAXone  (ROCEPHIN )  IV Stopped (06/15/23 0026)   heparin  2,100 Units/hr (06/15/23 0700)   PRN Meds: cyclobenzaprine , fentaNYL  (SUBLIMAZE ) injection, ondansetron  **OR** ondansetron  (ZOFRAN ) IV, traMADol    Vital Signs    Vitals:   06/14/23 2014 06/14/23 2014 06/14/23 2353 06/15/23 0345  BP: 105/72 105/72 125/67 101/85  Pulse: 78 91 73 95  Resp:  18 19 18   Temp:  98.5 F (36.9 C) 98.7 F (37.1 C) 98.2 F (36.8 C)  TempSrc:  Oral Oral Oral  SpO2:   95% 98%  Weight:      Height:        Intake/Output Summary (Last 24 hours) at 06/15/2023 0806 Last data filed at 06/15/2023 0540 Gross per 24 hour  Intake 1417.87 ml  Output 300 ml  Net 1117.87 ml   Filed Weights   06/09/23 2135 06/11/23 1205  Weight: 102 kg 102 kg    Telemetry    Developed Afib at 21:00 on 2/5, converting to sinus rhythm at 06:10 on 2/9 - Personally Reviewed  ECG    No new tracings - Personally Reviewed  Physical Exam   GEN: No acute distress.   Neck: No JVD. Cardiac: RRR, II/VI systolic murmur LSB, no rubs, or gallops.  Respiratory: Clear to auscultation bilaterally.  GI: Soft, nontender, non-distended.   MS: No edema; No deformity. Neuro:  Alert and oriented x 3; Nonfocal.  Psych: Normal affect.  Labs    Chemistry Recent Labs  Lab 06/09/23 2153 06/10/23 1531 06/11/23 0635 06/14/23 0341 06/14/23 0344 06/15/23 0342  NA 133*   < > 135 137 135 134*  K 2.8*   < > 3.6 3.8 3.8 4.1  CL  97*   < > 106 106 105 104  CO2 20*   < > 22 21* 21* 20*  GLUCOSE 139*   < > 99 111* 111* 104*  BUN 17   < > 13 21 22 22   CREATININE 1.14*   < > 1.02* 2.42* 2.45* 2.19*  CALCIUM  8.1*   < > 7.8* 8.2* 8.0* 8.3*  PROT 6.8  --  5.7*  --   --   --   ALBUMIN 2.9*  --  2.4*  --  2.6*  --   AST 53*  --  27  --   --   --   ALT 27  --  19  --   --   --   ALKPHOS 107  --  77  --   --   --   BILITOT 1.8*  --  1.0  --   --   --   GFRNONAA 51*   < > 58* 21* 20* 23*  ANIONGAP 16*   < > 7 10 9 10    < > = values in this interval not displayed.     Hematology Recent Labs  Lab 06/13/23 0206 06/14/23 0341 06/15/23 0342  WBC 14.7* 12.7* 12.7*  RBC 3.83* 3.34* 3.38*  HGB 10.2* 8.9* 9.1*  HCT 33.0* 28.1* 29.0*  MCV 86.2 84.1 85.8  MCH 26.6 26.6 26.9  MCHC 30.9 31.7 31.4  RDW 16.8* 17.2* 17.6*  PLT 192 168 200    Cardiac EnzymesNo results for input(s): TROPONINI in the last 168 hours. No results for input(s): TROPIPOC in the last 168 hours.   BNP Recent Labs  Lab 06/09/23 2153  BNP 720.2*     DDimer No results for input(s): DDIMER in the last 168 hours.   Radiology       Cardiac Studies   2D echo 06/10/2023: 1. Left ventricular ejection fraction, by estimation, is 50 to 55%. The  left ventricle has low normal function. The left ventricle has no regional  wall motion abnormalities. Left ventricular diastolic function could not  be evaluated.   2. Right ventricular systolic function is mildly reduced. The right  ventricular size is mildly enlarged.   3. Left atrial size was severely dilated.   4. Right atrial size was mildly dilated.   5. The mitral valve has been repaired/replaced. No evidence of mitral  valve regurgitation. Moderate to severe mitral stenosis. The mean mitral  valve gradient is 11.0 mmHg.   6. Tricuspid valve regurgitation is mild to moderate.   7. The aortic valve is tricuspid. There is mild thickening of the aortic  valve. Aortic valve regurgitation is  not visualized. Aortic valve  sclerosis/calcification is present, without any evidence of aortic  stenosis.   Conclusion(s)/Recommendation(s): Mitral valve prosthesis appears thickened  with increased mean gradient since last echo shortly after implantation in  10/2021 at [facility] (mean gratient 5 -> 11 mmHg). Endocarditis and/or pannus  formation cannot be excluded,  though a mobile vegetation is not clearly seen. Consider further  evaluation by transesophageal echocardiogram.  ________  TEE 06/11/2023: 1. Left ventricular ejection fraction, by estimation, is 55 to 60%. The  left ventricle has normal function. The left ventricle has no regional  wall motion abnormalities.   2. Right ventricular systolic function is mildly reduced. The right  ventricular size is normal.   3. Left atrial size was severely dilated. No left atrial/left atrial  appendage thrombus was detected.   4. Right atrial size was mildly dilated.   5. The mitral valve has been repaired/replaced. No evidence of mitral  valve regurgitation. Mild mitral stenosis. The mean mitral valve gradient  is 7.0 mmHg. There is a bioprosthetic valve present in the mitral  position. Echo findings are consistent with   vegetation of the posterior mitral prosthesis, with significant  thickening and a small mobile opacity coming from the posterior leaflet.   6. Tricuspid valve regurgitation is moderate.   7. The aortic valve is normal in structure. There is mild calcification  of the aortic valve. Aortic valve regurgitation is not visualized. Aortic  valve sclerosis is present, with no evidence of aortic valve stenosis.   8. The inferior vena cava is normal in size with greater than 50%  respiratory variability, suggesting right atrial pressure of 3 mmHg.   9. Agitated saline contrast bubble study was negative, with no evidence  of any interatrial shunt.   Conclusion(s)/Recommendation(s): Thickening of MV posterior leflet with  mobile  vegetation noted   Patient Profile     73 y.o. 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  Assessment & Plan    1.  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  PAF: -Presented in sinus rhythm with development of A-fib at 21:00 on 06/11/2023, converted to sinus rhythm at 06:10 on 2/9, remains in sinus rhythm at this time -Continue reload with amiodarone  400 mg twice daily -For added ventricular rate control, she was transitioned from Toprol  XL 25 mg daily to Lopressor  at titrated dose of 25 mg bid on 2/7, followed by further titration to 25 mg every 6 hours -Consolidate Lopressor  to 50 mg bid -Continue heparin  infusion with recommendation to transition to back to PTA rivaroxaban  prior to discharge  3.  Elevated troponin: -Suspected to be supply/demand ischemia in the setting of bacteremia -Echo this admission with preserved LV systolic function and no focal wall  motion abnormality -Continue heparin , aspirin , and beta-blocker  4.  Anemia: -Hemoglobin low though stable, remains down from a baseline around 12 -Denies symptoms concerning for bleeding -Monitor on heparin  drip -Per IM  5.  Hypokalemia: -Likely from GI loss -Improved      For questions or updates, please contact [facility] Please consult [email or web address] for contact info under Cardiology/STEMI.    Signed, Bernardino Bring, PA-C [facility] Pager: [phone] 06/15/2023, 8:06 AM

## 2023-06-15 NOTE — Progress Notes (Signed)
 ANTICOAGULATION CONSULT NOTE  Pharmacy Consult for heparin  infusion Indication: afib  No Known Allergies  Patient Measurements: Height: 5' 5 (165.1 cm) Weight: 102 kg (224 lb 13.9 oz) IBW/kg (Calculated) : 57 Heparin  Dosing Weight: 80.5 kg  Vital Signs: Temp: 98.2 F (36.8 C) (02/09 0345) Temp Source: Oral (02/09 0345) BP: 101/85 (02/09 0345) Pulse Rate: 95 (02/09 0345)  Labs: Recent Labs    06/12/23 0723 06/12/23 1557 06/13/23 0206 06/13/23 1236 06/13/23 2054 06/14/23 0341 06/14/23 0344 06/15/23 0342  HGB 9.1*  --  10.2*  --   --  8.9*  --  9.1*  HCT 28.5*  --  33.0*  --   --  28.1*  --  29.0*  PLT 160  --  192  --   --  168  --  200  APTT 90*  --   --   --   --   --   --   --   HEPARINUNFRC 0.36   < > <0.10*   < > 0.65 0.60  --  0.59  CREATININE  --   --   --   --   --  2.42* 2.45* 2.19*   < > = values in this interval not displayed.    Estimated Creatinine Clearance: 27.5 mL/min (A) (by C-G formula based on SCr of 2.19 mg/dL (H)).   Medical History: Past Medical History:  Diagnosis Date   Allergy    Anxiety    Arthritis    Asthma    Chronic back pain    Chronic combined systolic (congestive) and diastolic (congestive) heart failure (HCC)    a. 02/2020 Echo: EF 60-65%, no rwma. Gr2 DD. Nl RV size/fxn. Mild LAE. Mild MR; b. 10/2021 Echo: [facility] following MVR) EF 40 to 45%.   Depression    Diverticulitis    Endocarditis    a. 10/2021 S sanguinous bacteremia w/ MV veg-->57mm Mitris bioprosthetic valve, Maze, and LAA clip.   Hyperlipidemia    Hypertension    Lipoma    Mild mitral regurgitation    a. 08/2016 Echo: mild MR; b. 02/2020 Echo: Mild MR.   Neuromuscular disorder (HCC)    Obesity    Obstructive sleep apnea    PAF (paroxysmal atrial fibrillation) (HCC)    a. Dx 05/2016--> Xarelto  (CHA2DS2VASc = 3); b. 08/2016 Echo: EF 55-60%, no rwma, mild MR, mod dil LA; c. 03/2020 Zio: Avg HR 61 w/ Afib burden of 24% - max rate 189 (avg 93). 2 pauses - longest 4.7  secs post-conversion pauses (occurred @ 5:42 am); d. 04/2022 s/p DCCV; e. 08/2022 s/p DCCV.   Paroxysmal SVT (supraventricular tachycardia) (HCC)    S/P mitral valve replacement    a. 10/2021 [facility] - Endocarditis/MV Veg-->35mm Mitris bioprosthetic valve, Maze, and LAA clip.   Sleep apnea    Stroke [facility])    a. 02/2020 MRI/A: multifocal acute ischemia in R MCA territory predominantly involving post insula and R middle frontal gyrus. Nl MRA.   Tachy-brady syndrome (HCC)    Tobacco abuse    Medications:  PTA Meds: Rivaroxaban  20 mg qpm, last dose unknown  Assessment: Pt is a 73 yo female presenting to ED c/o diarrhea x6 days and vomiting. Reports feeling of heart fluttering and pain that started x2 days ago, found with elevated BNP and Troponin I level trending up. Pt has hx of afib and is on rivaroxaban . CHADSVASc 6. Now found to have endocarditis with hx of mitral bioprosthetic valve.   Goal of Therapy:  Heparin  level 0.3-0.7 units/ml Monitor platelets by anticoagulation protocol: Yes  Monitoring: 2/4: aPTT @ 1242 was 39 Subtherapeutic 2/4 2253 aPTT 54, subtherapeutic 2/5 1123 aPTT 78 2/5 2008 aPTT 55  SUBtherapeutic 2/6 0723 aPTT 90 HL 0.36 2/7 0206 HL < 0.10 subtherapeutic (confirmed level with lab) 2/7 1236 HL 0.48, therapeutic at 2100 units/hour 2/7 2054 HL 0.69m therapeutic x 2 2/8 0341 HL 0.60, therapeutic x 3 2/9 0342 HL 0.59, therapeutic x 4   Plan: Heparin  level remains therapeutic. Will continue heparin  infusion at 2100 unit/hr. Recheck heparin  level with AM labs. CBC daily while on heparin .   Rankin CANDIE Dills, PharmD, [facility] 06/15/2023 5:19 AM

## 2023-06-15 NOTE — Plan of Care (Signed)
  Problem: Education: Goal: Knowledge of General Education information will improve Description: Including pain rating scale, medication(s)/side effects and non-pharmacologic comfort measures Outcome: Progressing   Problem: Health Behavior/Discharge Planning: Goal: Ability to manage health-related needs will improve Outcome: Progressing   Problem: Clinical Measurements: Goal: Cardiovascular complication will be avoided Outcome: Progressing   Problem: Activity: Goal: Risk for activity intolerance will decrease Outcome: Progressing   Problem: Coping: Goal: Level of anxiety will decrease Outcome: Progressing   Problem: Pain Managment: Goal: General experience of comfort will improve and/or be controlled Outcome: Progressing   Problem: Safety: Goal: Ability to remain free from injury will improve Outcome: Progressing

## 2023-06-15 NOTE — Progress Notes (Signed)
 Date of Admission:  06/09/2023      ID: Tina Peters is a 73 y.o. female Principal Problem:   Endocarditis of mitral valve Active Problems:   Essential hypertension   Hypokalemia   PAF (paroxysmal atrial fibrillation) (HCC)   OSA (obstructive sleep apnea)   Chronic anticoagulation   CKD stage 3a, GFR 45-59 ml/min (HCC)   Acute bacterial endocarditis   HFrEF (heart failure with reduced ejection fraction) (HCC)   Chronic a-fib (HCC)   Bacteremia   Atrial fibrillation with RVR (HCC)   Demand ischemia (HCC)   Acute gastroenteritis   Acute sepsis (HCC)   Obesity, Class II, BMI 35-39.9   Nausea vomiting and diarrhea   S/P MVR (mitral valve repair)   Elevated troponin   AKI (acute kidney injury) (HCC)   Pt feeling better Ambulating on the floor with walker Some sob after that Medications:   amiodarone   400 mg Oral BID   aspirin  EC  81 mg Oral Daily   metoprolol  tartrate  50 mg Oral BID   sertraline   50 mg Oral Daily    Objective: Vital signs in last 24 hours: Patient Vitals for the past 24 hrs:  BP Temp Temp src Pulse Resp SpO2  06/15/23 1233 122/70 98.4 F (36.9 C) Oral 81 20 96 %  06/15/23 0849 132/77 98.6 F (37 C) -- 96 -- 97 %  06/15/23 0345 101/85 98.2 F (36.8 C) Oral 95 18 98 %  06/14/23 2353 125/67 98.7 F (37.1 C) Oral 73 19 95 %  06/14/23 2014 105/72 98.5 F (36.9 C) Oral 91 18 --  06/14/23 2014 105/72 -- -- 78 -- --  06/14/23 1541 108/69 98.5 F (36.9 C) Oral 82 -- 95 %    Awake and alert Chest b/l air entry Hss1s2 systolic murmur Abd soft CNS non focal  Lab Results    Latest Ref Rng & Units 06/15/2023    3:42 AM 06/14/2023    3:41 AM 06/13/2023    2:06 AM  CBC  WBC 4.0 - 10.5 K/uL 12.7  12.7  14.7   Hemoglobin 12.0 - 15.0 g/dL 9.1  8.9  89.7   Hematocrit 36.0 - 46.0 % 29.0  28.1  33.0   Platelets 150 - 400 K/uL 200  168  192        Latest Ref Rng & Units 06/15/2023    3:42 AM 06/14/2023    3:44 AM 06/14/2023    3:41 AM  CMP  Glucose 70 -  99 mg/dL 895  888  888   BUN 8 - 23 mg/dL 22  22  21    Creatinine 0.44 - 1.00 mg/dL 7.80  7.54  7.57   Sodium 135 - 145 mmol/L 134  135  137   Potassium 3.5 - 5.1 mmol/L 4.1  3.8  3.8   Chloride 98 - 111 mmol/L 104  105  106   CO2 22 - 32 mmol/L 20  21  21    Calcium  8.9 - 10.3 mg/dL 8.3  8.0  8.2       Microbiology: Gram positive cocci bacteremia- Gemella bacteremia  Studies/Results: CT MAXILLOFACIAL WO CONTRAST Result Date: 06/13/2023 CLINICAL DATA:  Family concerned for dental abscess. History of dental disease causing endocarditis. EXAM: CT MAXILLOFACIAL WITHOUT CONTRAST TECHNIQUE: Multidetector CT imaging of the maxillofacial structures was performed. Multiplanar CT image reconstructions were also generated. RADIATION DOSE REDUCTION: This exam was performed according to the departmental dose-optimization program which includes automated exposure control, adjustment  of the mA and/or kV according to patient size and/or use of iterative reconstruction technique. COMPARISON:  Facial CT 03/31/2023. FINDINGS: Osseous: No fracture or mandibular dislocation. No destructive process. Again noted the upper teeth have all been removed as well as the left mandibular molars and left mandibular second bicuspid. There is metallic artifact from fillings in the right mandibular molars. There are no periapical lucencies around the roots of the remaining teeth and no advanced decay is seen. Orbits: Negative. No traumatic or inflammatory finding. 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 Small retention cyst or polyp posteriorly in the left maxillary sinus. Remaining paranasal sinuses, bilateral mastoid air cells, and bilateral middle ears are clear. The nasal passages and both ostiomeatal complexes are patent. There is an S shaped nasal septum with left-sided spurring. Soft tissues: No abscess or focal inflammatory process is seen. Limited intracranial: Mild cerebral atrophy and small-vessel disease.  Calcific plaque in the carotid siphons. No acute findings involving the visualized brain. IMPRESSION: 1. No evidence of dental abscess or advanced decay. 2. No acute facial bone findings. 3. Chronic right frontal sinus osteoma. 4. Small retention cyst or polyp posteriorly in the left maxillary sinus. 5. S shaped nasal septum with left-sided spurring. 6. Mild cerebral atrophy and small-vessel disease. 7. Carotid atherosclerosis. Electronically Signed   By: Francis Quam M.D.   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 Will need 6 weeks of IV    Gastroenteritis- vomiting and diarrhea- resolved GI panel PCR negative. Cdiff neg Could this be secondary to the above infection   MRI thoracic spine no lesions   AKI-improving ( She got toradol , vnco early part of this admission)   Anemia   Leucocytosis- improving   H/o mitral valve endocarditis H/o streptococcus bacteremia   H/o left leg acute ischemia, compartment syndrome in 2023  Discussed the management of the patient and hospitalist

## 2023-06-16 ENCOUNTER — Inpatient Hospital Stay: Payer: Medicare HMO

## 2023-06-16 ENCOUNTER — Other Ambulatory Visit: Payer: Self-pay

## 2023-06-16 DIAGNOSIS — I059 Rheumatic mitral valve disease, unspecified: Secondary | ICD-10-CM

## 2023-06-16 DIAGNOSIS — R7881 Bacteremia: Secondary | ICD-10-CM | POA: Diagnosis not present

## 2023-06-16 DIAGNOSIS — R7989 Other specified abnormal findings of blood chemistry: Secondary | ICD-10-CM | POA: Diagnosis not present

## 2023-06-16 DIAGNOSIS — I480 Paroxysmal atrial fibrillation: Secondary | ICD-10-CM | POA: Diagnosis not present

## 2023-06-16 DIAGNOSIS — I330 Acute and subacute infective endocarditis: Secondary | ICD-10-CM | POA: Diagnosis not present

## 2023-06-16 LAB — CBC
HCT: 28.5 % — ABNORMAL LOW (ref 36.0–46.0)
Hemoglobin: 8.9 g/dL — ABNORMAL LOW (ref 12.0–15.0)
MCH: 27 pg (ref 26.0–34.0)
MCHC: 31.2 g/dL (ref 30.0–36.0)
MCV: 86.4 fL (ref 80.0–100.0)
Platelets: 201 10*3/uL (ref 150–400)
RBC: 3.3 MIL/uL — ABNORMAL LOW (ref 3.87–5.11)
RDW: 17.9 % — ABNORMAL HIGH (ref 11.5–15.5)
WBC: 10.9 10*3/uL — ABNORMAL HIGH (ref 4.0–10.5)
nRBC: 0.5 % — ABNORMAL HIGH (ref 0.0–0.2)

## 2023-06-16 LAB — CULTURE, BLOOD (ROUTINE X 2)

## 2023-06-16 LAB — HEPARIN LEVEL (UNFRACTIONATED): Heparin Unfractionated: 0.66 [IU]/mL (ref 0.30–0.70)

## 2023-06-16 MED ORDER — SODIUM CHLORIDE 0.9% FLUSH
10.0000 mL | Freq: Two times a day (BID) | INTRAVENOUS | Status: DC
  Administered 2023-06-16 – 2023-06-17 (×2): 10 mL

## 2023-06-16 MED ORDER — CHLORHEXIDINE GLUCONATE CLOTH 2 % EX PADS
6.0000 | MEDICATED_PAD | Freq: Every day | CUTANEOUS | Status: DC
  Administered 2023-06-16 – 2023-06-17 (×2): 6 via TOPICAL

## 2023-06-16 MED ORDER — SODIUM CHLORIDE 0.9% FLUSH: 10.0000 mL | INTRAVENOUS | Status: DC | PRN

## 2023-06-16 NOTE — TOC Progression Note (Addendum)
Transition of Care [facility]) - Progression Note    Patient Details  Name: Tina Peters MRN: [id number] Date of Birth: August 01, 1950  Transition of [facility]) CM/SW Contact  Truddie Hidden, RN Phone Number: 06/16/2023, 4:33 PM  Clinical Narrative:    Referral for [facility] sent [facility]. [facility] does not have RN available for her location.   Message sent to [facility] from [employer] advised referral was sent to [facility] for [facility] RN.           Expected Discharge Plan and Services                                               Social Determinants of Health (SDOH) Interventions SDOH Screenings   Food Insecurity: No Food Insecurity (06/10/2023)  Housing: Low Risk  (06/10/2023)  Transportation Needs: No Transportation Needs (06/10/2023)  Utilities: Not At Risk (06/10/2023)  Alcohol Screen: Low Risk  (01/27/2023)  Depression (PHQ2-9): Medium Risk (01/27/2023)  Financial Resource Strain: Medium Risk (01/27/2023)  Physical Activity: Insufficiently Active (01/27/2023)  Social Connections: Socially Isolated (06/10/2023)  Stress: Stress Concern Present (01/27/2023)  Tobacco Use: Medium Risk (06/09/2023)  Health Literacy: Adequate Health Literacy (01/27/2023)    Readmission Risk Interventions     No data to display

## 2023-06-16 NOTE — Progress Notes (Signed)
 PHARMACY CONSULT NOTE FOR:  OUTPATIENT  PARENTERAL ANTIBIOTIC THERAPY (OPAT)  Indication: Bacteremia with prosthetic mitral valve endocarditis  Regimen: Ceftriaxone  2 grams IV every 24 hours  End date: 07/22/2023 (6 weeks)  IV antibiotic discharge orders are pended. To discharging provider:  please sign these orders via discharge navigator,  Select New Orders & click on the button choice - Manage This Unsigned Work.     Thank you for allowing pharmacy to be a part of this patient's care.  Yulitza Shorts Rodriguez-Guzman PharmD, BCPS 06/16/2023 8:34 PM

## 2023-06-16 NOTE — Progress Notes (Signed)
 Occupational Therapy Treatment Patient Details Name: Tina Peters MRN: [id number] DOB: 1950/11/25 Today's Date: 06/16/2023   History of present illness 73 y/o female presented to ED on 06/09/23 for diarrhea x 6 days and vomitting along with heart fluttering and pain x 2 days. Admitted for acute gastroenteritis and sepsis. PMH: CHF, HTN, Afib on anticoagulation, hx of CVA, OSA, CKD stage IIIa   OT comments  Pt is supine in bed on arrival. Pleasant and agreeable to OT session. She denies pain. Pt performed bed mobility MOD I, STS SUP and SPT to [facility] for urgent urination as well as BM with SUP. She was able to perform hygiene via seated lateral leans with SUP. She was able to bathe sitting on BSC with set up assist for UB and SUP in standing for safety with LB ADLs. She stood at sink to perform oral care with SUP, then ambulated in room with no AD, but holding to bed with SUP/SBA to get to recliner. HR remained in 80's-90's during session. Per pt, she may be transferred to have valve replacement surgery. She was left with all needs in place and will cont to require skilled acute OT services to maximize her safety and IND to return to PLOF.       If plan is discharge home, recommend the following:  Help with stairs or ramp for entrance   Equipment Recommendations  Tub/shower seat    Recommendations for Other Services      Precautions / Restrictions Precautions Precautions: None Restrictions Weight Bearing Restrictions Per Provider Order: No       Mobility Bed Mobility Overal bed mobility: Modified Independent                  Transfers Overall transfer level: Needs assistance Equipment used: None Transfers: Sit to/from Stand, Bed to chair/wheelchair/BSC Sit to Stand: Supervision Stand pivot transfers: Supervision         General transfer comment: SUP for STS and SPT bed to [facility] for urination and BM urgently     Balance Overall balance assessment: Needs  assistance Sitting-balance support: No upper extremity supported Sitting balance-Leahy Scale: Good     Standing balance support: No upper extremity supported, During functional activity Standing balance-Leahy Scale: Fair Standing balance comment: SUP                           ADL either performed or assessed with clinical judgement   ADL Overall ADL's : Needs assistance/impaired     Grooming: Wash/dry face;Oral care;Sitting;Standing;Supervision/safety   Upper Body Bathing: Set up;Sitting   Lower Body Bathing: Supervison/ safety;Sit to/from stand   Upper Body Dressing : Supervision/safety;Sitting       Toilet Transfer: Supervision/safety;BSC/3in1   Toileting- Clothing Manipulation and Hygiene: Supervision/safety;Sitting/lateral lean         General ADL Comments: pt utilized BSC for toileting needs with SUP for transfer and hygiene; able to bathe sitting on BSC with set up assist; SUP in standing for safety; stood at sink to perform oral care with SUP in standing; ambulated in room with no AD holding to bed with SUP to get to recliner    Extremity/Trunk Assessment              Vision       Perception     Praxis      Cognition Arousal: Alert Behavior During Therapy: [facility] for tasks assessed/performed Overall Cognitive Status: Within Functional Limits for tasks assessed  Exercises      Shoulder Instructions       General Comments      Pertinent Vitals/ Pain       Pain Assessment Pain Assessment: No/denies pain  Home Living                                          Prior Functioning/Environment              Frequency  Min 1X/week        Progress Toward Goals  OT Goals(current goals can now be found in the care plan section)  Progress towards OT goals: Progressing toward goals  Acute Rehab OT Goals Patient Stated Goal: return home OT Goal  Formulation: With patient Time For Goal Achievement: 06/27/23 Potential to Achieve Goals: Good  Plan      Co-evaluation                 AM-PAC OT "6 Clicks" Daily Activity     Outcome Measure   Help from another person eating meals?: None Help from another person taking care of personal grooming?: None Help from another person toileting, which includes using toliet, bedpan, or urinal?: A Little Help from another person bathing (including washing, rinsing, drying)?: A Little Help from another person to put on and taking off regular upper body clothing?: None Help from another person to put on and taking off regular lower body clothing?: A Little 6 Click Score: 21    End of Session    OT Visit Diagnosis: Other abnormalities of gait and mobility (R26.89);Muscle weakness (generalized) (M62.81)   Activity Tolerance     Patient Left in chair;with call bell/phone within reach   Nurse Communication Mobility status        Time: 1017-1050 OT Time Calculation (min): 33 min  Charges: OT General Charges $OT Visit: 1 Visit OT Treatments $Self Care/Home Management : 8-22 mins $Therapeutic Activity: 8-22 mins  Skylor Schnapp, OTR/L  06/16/23, 1:29 PM   Babatunde Seago E Tenesia Escudero 06/16/2023, 1:26 PM

## 2023-06-16 NOTE — Progress Notes (Signed)
 Peripherally Inserted Central Catheter Placement  The IV Nurse has discussed with the patient and/or persons authorized to consent for the patient, the purpose of this procedure and the potential benefits and risks involved with this procedure.  The benefits include less needle sticks, lab draws from the catheter, and the patient may be discharged home with the catheter. Risks include, but not limited to, infection, bleeding, blood clot (thrombus formation), and puncture of an artery; nerve damage and irregular heartbeat and possibility to perform a PICC exchange if needed/ordered by physician.  Alternatives to this procedure were also discussed.  Bard Power PICC patient education guide, fact sheet on infection prevention and patient information card has been provided to patient /or left at bedside.    PICC Placement Documentation  PICC Single Lumen 06/16/23 Right Basilic 40 cm 0 cm (Active)  Indication for Insertion or Continuance of Line Prolonged intravenous therapies 06/16/23 1855  Exposed Catheter (cm) 0 cm 06/16/23 1855  Site Assessment Clean, Dry, Intact 06/16/23 1855  Line Status Flushed;Saline locked;Blood return noted 06/16/23 1855  Dressing Type Transparent;Securing device 06/16/23 1855  Dressing Status Antimicrobial disc/dressing in place;Clean, Dry, Intact 06/16/23 1855  Line Care Connections checked and tightened 06/16/23 1855  Line Adjustment (NICU/IV Team Only) No 06/16/23 1855  Dressing Intervention New dressing;Adhesive placed at insertion site (IV team only);Other (Comment) 06/16/23 1855  Dressing Change Due 06/23/23 06/16/23 1855       Nadean August 06/16/2023, 7:01 PM

## 2023-06-16 NOTE — Progress Notes (Addendum)
 Date of Admission:  06/09/2023      ID: Tina Peters is a 73 y.o. female Principal Problem:   Endocarditis of mitral valve Active Problems:   Essential hypertension   Hypokalemia   PAF (paroxysmal atrial fibrillation) (HCC)   OSA (obstructive sleep apnea)   Chronic anticoagulation   CKD stage 3a, GFR 45-59 ml/min (HCC)   Acute bacterial endocarditis   HFrEF (heart failure with reduced ejection fraction) (HCC)   Chronic a-fib (HCC)   Bacteremia   Atrial fibrillation with RVR (HCC)   Demand ischemia (HCC)   Acute gastroenteritis   Acute sepsis (HCC)   Obesity, Class II, BMI 35-39.9   Nausea vomiting and diarrhea   S/P MVR (mitral valve repair)   Elevated troponin   AKI (acute kidney injury) (HCC)   Prosthetic valve endocarditis (HCC)   Pt feeling better Pt doing okay Appetite has been poor, but improving Diarrhea improved PICC placed today Medications:   amiodarone   400 mg Oral BID   aspirin  EC  81 mg Oral Daily   metoprolol  tartrate  50 mg Oral BID   sertraline   50 mg Oral Daily    Objective: Vital signs in last 24 hours: Patient Vitals for the past 24 hrs:  BP Temp Temp src Pulse Resp SpO2  06/16/23 0748 (!) 126/52 98.6 F (37 C) -- 75 18 98 %  06/15/23 2327 131/64 99 F (37.2 C) Oral 73 18 97 %  06/15/23 1932 115/70 98.6 F (37 C) Oral 83 18 98 %  06/15/23 1605 111/70 98.2 F (36.8 C) -- 80 -- 96 %  06/15/23 1233 122/70 98.4 F (36.9 C) Oral 81 20 96 %    Awake and alert Chest b/l air entry Hss1s2 systolic murmur Abd soft CNS non focal Rt arm PICC  Lab Results    Latest Ref Rng & Units 06/16/2023    4:04 AM 06/15/2023    3:42 AM 06/14/2023    3:41 AM  CBC  WBC 4.0 - 10.5 K/uL 10.9  12.7  12.7   Hemoglobin 12.0 - 15.0 g/dL 8.9  9.1  8.9   Hematocrit 36.0 - 46.0 % 28.5  29.0  28.1   Platelets 150 - 400 K/uL 201  200  168        Latest Ref Rng & Units 06/15/2023    3:42 AM 06/14/2023    3:44 AM 06/14/2023    3:41 AM  CMP  Glucose 70 - 99 mg/dL  811  914  782   BUN 8 - 23 mg/dL 22  22  21    Creatinine 0.44 - 1.00 mg/dL 9.56  2.13  0.86   Sodium 135 - 145 mmol/L 134  135  137   Potassium 3.5 - 5.1 mmol/L 4.1  3.8  3.8   Chloride 98 - 111 mmol/L 104  105  106   CO2 22 - 32 mmol/L 20  21  21    Calcium  8.9 - 10.3 mg/dL 8.3  8.0  8.2       Microbiology: 06/09/23 Gram positive cocci bacteremia- Gamella bacteremia 06/13/23 Repeat blood culture Neg Studies/Results: No results found.    Assessment/Plan: Gemella Bacteremia - facultative anerobe ( found in oral cavity and GIT) with prosthetic mitral valve endocarditis No Surgical intervention as per CTS  Pt is currently on ceftriaxone  . Will need 6 weeks of IV  Would need suppressive therapy after that   Gastroenteritis- vomiting and diarrhea- resolved GI panel PCR negative. Cdiff neg  MRI thoracic spine no lesions   AKI-improving ( She got toradol , vnco early part of this admission)   Anemia   Leucocytosis- resolved   H/o mitral valve endocarditis H/o streptococcus bacteremia   H/o left leg acute ischemia, compartment syndrome in 2023  Discussed the management of the patient and hospitalist  Will place OPAT orders

## 2023-06-16 NOTE — Plan of Care (Signed)
  Problem: Clinical Measurements: Goal: Ability to maintain clinical measurements within normal limits will improve Outcome: Progressing   Problem: Pain Managment: Goal: General experience of comfort will improve and/or be controlled Outcome: Progressing   Problem: Safety: Goal: Ability to remain free from injury will improve Outcome: Progressing   Problem: Activity: Goal: Risk for activity intolerance will decrease Outcome: Progressing

## 2023-06-16 NOTE — Progress Notes (Signed)
 Physical Therapy Treatment Patient Details Name: Tina Peters MRN: [id number] DOB: Nov 25, 1950 Today's Date: 06/16/2023   History of Present Illness 73 y/o female presented to ED on 06/09/23 for diarrhea x 6 days and vomitting along with heart fluttering and pain x 2 days. Admitted for acute gastroenteritis and sepsis. PMH: CHF, HTN, Afib on anticoagulation, hx of CVA, OSA, CKD stage IIIa    PT Comments  Patient making progress towards physical therapy goals. Ambulated 80' x 2 with supervision and no AD. Seated rest break between bouts with VSS throughout. Encouraged continued mobility to improve activity tolerance. Education provided on energy conservation technique for discharge, patient verbalized understanding. Discharge plan remains appropriate. Patient plans to stay with daughter at discharge.     If plan is discharge home, recommend the following: A little help with walking and/or transfers;A little help with bathing/dressing/bathroom;Assistance with cooking/housework;Assist for transportation;Help with stairs or ramp for entrance   Can travel by private vehicle        Equipment Recommendations  Rolling Edis Huish (2 wheels)    Recommendations for Other Services       Precautions / Restrictions Precautions Precautions: Fall Restrictions Weight Bearing Restrictions Per Provider Order: No     Mobility  Bed Mobility Overal bed mobility: Modified Independent                  Transfers Overall transfer level: Needs assistance Equipment used: None Transfers: Sit to/from Stand Sit to Stand: Supervision                Ambulation/Gait Ambulation/Gait assistance: Supervision Gait Distance (Feet): 80 Feet (+80') Assistive device: None Gait Pattern/deviations: Step-through pattern, Decreased stride length Gait velocity: decreased     General Gait Details: supervision for safety. Seated rest break between each bout of mobility. VSS   Stairs              Wheelchair Mobility     Tilt Bed    Modified Rankin (Stroke Patients Only)       Balance Overall balance assessment: Needs assistance Sitting-balance support: No upper extremity supported Sitting balance-Leahy Scale: Good     Standing balance support: No upper extremity supported, During functional activity Standing balance-Leahy Scale: Fair                              Cognition Arousal: Alert Behavior During Therapy: WFL for tasks assessed/performed Overall Cognitive Status: Within Functional Limits for tasks assessed                                          Exercises      General Comments        Pertinent Vitals/Pain Pain Assessment Pain Assessment: No/denies pain    Home Living                          Prior Function            PT Goals (current goals can now be found in the care plan section) Acute Rehab PT Goals PT Goal Formulation: With patient Time For Goal Achievement: 06/27/23 Potential to Achieve Goals: Good Progress towards PT goals: Progressing toward goals    Frequency    Min 1X/week      PT Plan      Co-evaluation  AM-PAC PT "6 Clicks" Mobility   Outcome Measure  Help needed turning from your back to your side while in a flat bed without using bedrails?: A Little Help needed moving from lying on your back to sitting on the side of a flat bed without using bedrails?: A Little Help needed moving to and from a bed to a chair (including a wheelchair)?: A Little Help needed standing up from a chair using your arms (e.g., wheelchair or bedside chair)?: A Little Help needed to walk in hospital room?: A Little Help needed climbing 3-5 steps with a railing? : A Little 6 Click Score: 18    End of Session   Activity Tolerance: Patient tolerated treatment well Patient left: in bed;with call bell/phone within reach Nurse Communication: Mobility status PT Visit Diagnosis:  Unsteadiness on feet (R26.81);Muscle weakness (generalized) (M62.81);Other abnormalities of gait and mobility (R26.89)     Time: 5409-8119 PT Time Calculation (min) (ACUTE ONLY): 20 min  Charges:    $Therapeutic Activity: 8-22 mins PT General Charges $$ ACUTE PT VISIT: 1 Visit                     Janine Melbourne, PT, DPT Physical Therapist - [facility]  [facility]    Terrace Chiem A Eathan Groman 06/16/2023, 4:50 PM

## 2023-06-16 NOTE — Progress Notes (Signed)
   Patient Name: Tina Peters Date of Encounter: 06/16/2023 [facility] Cardiologist: Antionette Kirks, MD   Interval Summary  .    Feeling better today.  She notes sporadic sharp chest pain lasting only a second or two at a time.  No shortness of breath, palpitations, or lightheadedness.  Still having some nausea and loose stools.  Vital Signs .    Vitals:   06/15/23 1932 06/15/23 2327 06/16/23 0748 06/16/23 1149  BP: 115/70 131/64 (!) 126/52 99/64  Pulse: 83 73 75 77  Resp: 18 18 18 19   Temp: 98.6 F (37 C) 99 F (37.2 C) 98.6 F (37 C) 98.7 F (37.1 C)  TempSrc: Oral Oral  Oral  SpO2: 98% 97% 98% 98%  Weight:      Height:        Intake/Output Summary (Last 24 hours) at 06/16/2023 1522 Last data filed at 06/16/2023 0900 Gross per 24 hour  Intake 809.86 ml  Output 550 ml  Net 259.86 ml      06/11/2023   12:05 PM 06/09/2023    9:35 PM 06/03/2023    2:17 PM  Last 3 Weights  Weight (lbs) 224 lb 13.9 oz 224 lb 13.9 oz 226 lb  Weight (kg) 102 kg 102 kg 102.513 kg      Telemetry/ECG    Atrial fibrillation with ventricular rates 60-90 bpm - Personally Reviewed  Physical Exam .   GEN: No acute distress.   Neck: No JVD Cardiac: IRRR, 1/6 systolic murmur.  No rubs or gallops. Respiratory: Mildly diminished breath sounds throughout. GI: Soft, nontender, non-distended  MS: No edema  Assessment & Plan .     Bioprosthetic mitral valve endocarditis: No evidence of significant valve dysfunction though leaflet thickening and possible vegetation noted on TTE/TEE.  Case reviewed with Dr. Honey Lusty (cardiac surgery at [facility]); he recommends completion of 6 weeks of antibiotics per ID and repeat TTE at that time.  He would reserve valve intervention only for evidence of significant valve dysfunction and/or persistent bacteremia/sepsis.  Paroxysmal atrial fibrillation: Ventricular rates reasonably well-controlled at this time, with the patient having been in and out of  atrial fibrillation throughout this admission.  Recommend continued amiodarone  load and IV heparin  with transition to apixaban  prior to discharge.  Elevated troponin: Most likely due to supply/demand mismatch.  Ms. Garms notes sporadic atypical chest pain.  Recommend against catheterization at this time unless patient has persistent angina with other signs of ongoing myocardial ischemia, given [facility] at [facility] demonstrating mild CAD in 10/2021, acute kidney injury, and risk for further infectious complications in the setting of bacteremia/endocarditis.  For questions or updates, please contact [facility] Please consult [email or web address] for contact info under [facility].     Signed, Sammy Crisp, MD

## 2023-06-16 NOTE — Progress Notes (Signed)
 PROGRESS NOTE    Tina Peters   ZSW:[id number] DOB: [birth year]  DOA: 06/09/2023 Date of Service: 06/16/23 which is hospital day 6  PCP: Mazie Speed, MD    Hospital course / significant events:   HPI: 73 year old female history of A-fib on Xarelto , hypertension, CKD stage IIIa, prior history of stroke, history of systolic heart failure EF of 45%, hypertension, presents to the ER with a 1-week history of nausea, vomiting and diarrhea.  Patient states that she got sick last week.  Was seen in the ER on January 26.  She had palpitations that time.  Workup was negative.  Patient discharged to home.  She followed up in the office and was diagnosed with flulike symptoms.  She had a respiratory viral panel that was negative.  She continued to have diarrhea along with nausea and vomiting.  She states that she felt some tightness in her chest and her abdomen.  She has been vomiting.  She felt some palpitations in the middle of the night.  She came to the ER 02/03 for evaluation.  02/03: to ED evening - elevated BNP, procalcitonin, troponin 283 --> 331 started on heparin , lactate 2.6 -(1L IVF)--> 1.7, started on broad spectrum abx cefepime  + vanc + flagyl .  02/04: admitted to hospitalist service w/ concern for gastroenteritis w/ concern for associated sepsis (reasonably ruled out UTI, PNA, colitis or other intraabdominal bacterial infection).  02/05: unfortunately (+)TEE for mitral valve vegetation.  Blood culture (+)gram positive cocci in anaerobic bottle. ID consulted --> await final BCx results, continue ceftriaxone  + flagyl , checking CDiff and may need colonoscopy, recs for MRI spine d/t pain/bacteremia. Pt reports diarrhea has improved but still present. CDiff neg. Presented in sinus rhythm with development of A-fib at 21:00 on 06/11/2023, remains in A-fib with ventricular rates largely in the low 100s bpm with brief episodes into the 120s to 140s bpm  02/06: cardiology adjusting amio and beta  blocker. MRI T/L spine: no findings to indicate osteomyelitis/discitis. GPC culture reincubated for better growth 02/07: repeat BCx draw this morning   02/08-02/09: no growth thus far on new cultures.. Await final abx recs and outpatient abx / PICC expect for Mon 02/10  02/10: confirmed w/ CT surgery re: no plan for surgical intervention, f/u w/ TEE 6 weeks. 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  Infection likely caught relatively early given no significant valve regurgitation, grossly no abscess formation though will need to be monitored closely  Hx: June 2023 admitted to [facility] Streptococcus sanguinous bacteremia from infected tooth with mitral valve vegetation, at that time underwent mitral valve replacement with bioprosthetic valve MRI spine L and T spine given back pain in both areas --> no evidence osteo/discitis CT maxillofacial no dental abscess  ID following 6 weeks IV Abx w/ ceftriaxone   PICC line Following second set BCx Cardiology following, have discussed w/ CT surgery and recs for NO surgery/replacement at this time, favor plan for repeat TEE in 6 weeks to reevaluate mitral valve   Anemia No  s/s bleeding Caution on heparin  Monitor Hgb/CBC  Acute gastroenteritis - improving but still some diarrhea Clinically, concern for norovirus vs other viral illness, CDiff engative  check stool studies --> negative / nothing detected  Enteric/contact precautions have been dc as sx  improved as well Diet adv as tolerated     Back pain Ruled out osteomyelitis/discitis Tylenol  prn mild Tramadol  prn mod-severe Fentanyl  IV prn breakthrough Pt educated on goal to avoid IV pain meds if at all able   Demand ischemia  Elevated troponin Favor demand ischarmia over NSTEMI Monitoring for chest pain   Hypokalemia D/t diarrhea Replace as needed Monitor BMP   Chronic paroxysmal A-fib  On admission, EKG shows normal sinus rhythm.   Had more rapid rate here transiently Echo this admission with preserved LV systolic function and no focal wall motion abnormality  Xarelto  for anticoagulation at home - held while on heparin  (in case needing further procedure but anticipate xarelto  prior to dc) Reloaded w/ amiodarone  po  Toprol  switched to lopressor  - cardiology is titrating this  Cardiology following    On amiodarone  Thyroid  panel per cardiology   AKI Improving Follow BMP outpatient   History HFrEF (heart failure with reduced ejection fraction) NOT in exacerbation  On admission, patient has actually intra vascular volume depleted.   Echo this admission with preserved LV systolic function and no focal wall motion abnormality  Cardiology following    Stage 3a chronic kidney disease (HCC) - Baseline creatinine 1.1-1.4 On admission, patient has a history of CKD stage IIIa.  Baseline creatinine 1.1-1.4.  Cr on admission is close to baseline, no AKI Hold her Aldactone  and her losartan  for now.   OSA (obstructive sleep apnea) On admission continue CPAP   Essential hypertension Home blood pressure medications include Aldactone , Toprol -XL, Cozaar .   hold her Cozaar  and Aldactone  due to dehydration/AKI.   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  Underweight - under 18  overweight - 25  to 29 obese - 30 or more Class 1 obesity: BMI of 30.0 to 34 Class 2 obesity: BMI of 35.0 to 39 Class 3 obesity: BMI of 40.0 to 49 Super Morbid Obesity: BMI 50-59 Super-super Morbid Obesity: BMI 60+ Significantly low or high BMI is associated with higher medical risk.  Weight management advised as adjunct to other disease management and risk reduction treatments    DVT prophylaxis: heparin  IV fluids: no continuous IV fluids  Nutrition: regular diet Central lines / invasive devices: none  Code Status: FULL CODE ACP documentation reviewed:  none on file in [facility]  TOC needs: home health, PT/OT and IV abx  Barriers to dispo / significant pending items: IV abx, PICC line, fmaily education on IV abx              Subjective / Brief ROS:  Patient reports no concerns today  Denies pain Tolerating diet Ambulating independently   Family Communication: spoke on phone w/ daughter linda 06/16/23 2:38 PM     Objective Findings:  Vitals:   06/15/23 1932 06/15/23 2327 06/16/23 0748 06/16/23 1149  BP: 115/70 131/64 (!) 126/52 99/64  Pulse: 83 73 75 77  Resp: 18 18 18 19   Temp: 98.6 F (37 C) 99 F (37.2 C) 98.6 F (37 C) 98.7 F (37.1 C)  TempSrc: Oral Oral  Oral  SpO2: 98% 97% 98% 98%  Weight:      Height:        Intake/Output Summary (Last 24 hours) at 06/16/2023 1438 Last data filed at 06/16/2023 0900 Gross per 24 hour  Intake 809.86 ml  Output 550 ml  Net 259.86 ml   Filed Weights   06/09/23 2135 06/11/23 1205  Weight: 102 kg 102 kg    Examination:  Physical Exam Constitutional:      General: She is not in acute distress. Cardiovascular:     Rate and Rhythm: Normal rate and regular rhythm.  Pulmonary:     Effort: Pulmonary effort is normal.     Breath sounds: Normal breath sounds.  Neurological:     General: No focal deficit present.     Mental Status: She is alert and oriented to person, place, and time. Mental status is at baseline.  Psychiatric:         Mood and Affect: Mood normal.        Behavior: Behavior normal.          Scheduled Medications:   amiodarone   400 mg Oral BID   aspirin  EC  81 mg Oral Daily   metoprolol  tartrate  50 mg Oral BID   sertraline   50 mg Oral Daily    Continuous Infusions:  cefTRIAXone  (ROCEPHIN )  IV 2 g (06/16/23 1332)   heparin  2,100 Units/hr (06/16/23 0418)    PRN Medications:  cyclobenzaprine , fentaNYL  (SUBLIMAZE ) injection, ondansetron  **OR** ondansetron  (ZOFRAN ) IV, traMADol   Antimicrobials from admission:  Anti-infectives (From admission, onward)    Start     Dose/Rate Route Frequency Ordered Stop   06/11/23 2200  metroNIDAZOLE  (FLAGYL ) IVPB 500 mg  Status:  Discontinued        500 mg 100 mL/hr over 60 Minutes Intravenous Every 12 hours 06/11/23 1628 06/13/23 1541   06/11/23 0015  cefTRIAXone  (ROCEPHIN ) 2 g in sodium chloride  0.9 % 100 mL IVPB        2 g 200 mL/hr over 30 Minutes Intravenous Every 24 hours 06/10/23 2320     06/11/23 0000  metroNIDAZOLE  (FLAGYL ) tablet 500 mg  Status:  Discontinued        500 mg Oral Every 12 hours 06/10/23 2320 06/11/23 1628   06/09/23 2245  ceFEPIme  (MAXIPIME ) 2 g in sodium chloride  0.9 % 100 mL IVPB        2 g 200 mL/hr over 30 Minutes Intravenous  Once 06/09/23 2238 06/09/23 2324   06/09/23 2245  metroNIDAZOLE  (FLAGYL ) IVPB 500 mg        500 mg 100 mL/hr over 60 Minutes Intravenous  Once 06/09/23 2238 06/10/23 0035   06/09/23 2245  vancomycin  (VANCOCIN ) IVPB 1000 mg/200 mL premix        1,000 mg 200 mL/hr over 60 Minutes Intravenous  Once 06/09/23 2238 06/10/23 0035           Data Reviewed:  I have personally reviewed the following...  CBC: Recent Labs  Lab 06/09/23 2153 06/10/23 1531 06/11/23 2841 06/12/23 0723 06/13/23 0206 06/14/23 0341 06/15/23 0342 06/16/23 0404  WBC 43.3* 22.2* 16.8* 12.0* 14.7* 12.7* 12.7* 10.9*  NEUTROABS 38.8* 20.1* 13.8*  --   --   --   --   --   HGB 10.2* 9.4* 8.5* 9.1* 10.2* 8.9* 9.1* 8.9*   HCT 31.4* 29.7* 27.5* 28.5* 33.0* 28.1* 29.0* 28.5*  MCV 82.2 84.9 84.1 83.6 86.2 84.1 85.8 86.4  PLT 186 178  160 160 192 168 200 201   Basic Metabolic Panel: Recent Labs  Lab 06/09/23 2153 06/10/23 1531 06/11/23 0635 06/14/23 0341 06/14/23 0344 06/15/23 0342  NA 133* 137 135 137 135 134*  K 2.8* 3.7 3.6 3.8 3.8 4.1  CL 97* 103 106 106 105 104  CO2 20* 24 22 21* 21* 20*  GLUCOSE 139* 130* 99 111* 111* 104*  BUN 17 15 13 21 22 22   CREATININE 1.14* 1.12* 1.02* 2.42* 2.45* 2.19*  CALCIUM  8.1* 7.9* 7.8* 8.2* 8.0* 8.3*  MG 1.8 2.3  --  2.1  --   --   PHOS  --   --   --   --  4.3  --    GFR: Estimated Creatinine Clearance: 27.5 mL/min (A) (by C-G formula based on SCr of 2.19 mg/dL (H)). Liver Function Tests: Recent Labs  Lab 06/09/23 2153 06/11/23 0635 06/14/23 0344  AST 53* 27  --   ALT 27 19  --   ALKPHOS 107 77  --   BILITOT 1.8* 1.0  --   PROT 6.8 5.7*  --   ALBUMIN 2.9* 2.4* 2.6*   Recent Labs  Lab 06/09/23 2153  LIPASE 22   No results for input(s): "AMMONIA" in the last 168 hours. Coagulation Profile: Recent Labs  Lab 06/10/23 0359  INR 1.6*   Cardiac Enzymes: No results for input(s): "CKTOTAL", "CKMB", "CKMBINDEX", "TROPONINI" in the last 168 hours. BNP (last 3 results) No results for input(s): "PROBNP" in the last 8760 hours. HbA1C: No results for input(s): "HGBA1C" in the last 72 hours. CBG: No results for input(s): "GLUCAP" in the last 168 hours. Lipid Profile: No results for input(s): "CHOL", "HDL", "LDLCALC", "TRIG", "CHOLHDL", "LDLDIRECT" in the last 72 hours. Thyroid  Function Tests: No results for input(s): "TSH", "T4TOTAL", "FREET4", "T3FREE", "THYROIDAB" in the last 72 hours.  Anemia Panel: No results for input(s): "VITAMINB12", "FOLATE", "FERRITIN", "TIBC", "IRON", "RETICCTPCT" in the last 72 hours. Most Recent Urinalysis On File:     Component Value Date/Time   COLORURINE YELLOW (A) 06/09/2023 2153   APPEARANCEUR CLEAR (A) 06/09/2023  2153   LABSPEC 1.028 06/09/2023 2153   PHURINE 6.0 06/09/2023 2153   GLUCOSEU NEGATIVE 06/09/2023 2153   HGBUR SMALL (A) 06/09/2023 2153   BILIRUBINUR NEGATIVE 06/09/2023 2153   KETONESUR NEGATIVE 06/09/2023 2153   PROTEINUR NEGATIVE 06/09/2023 2153   NITRITE NEGATIVE 06/09/2023 2153   LEUKOCYTESUR NEGATIVE 06/09/2023 2153   Sepsis Labs: @LABRCNTIP (procalcitonin:4,lacticidven:4) Microbiology: Recent Results (from the past 240 hours)  Gastrointestinal Panel by PCR , Stool     Status: None   Collection Time: 06/09/23 12:42 PM   Specimen: Stool  Result Value Ref Range Status   Campylobacter species NOT DETECTED NOT DETECTED Final   Plesimonas shigelloides NOT DETECTED NOT DETECTED Final   Salmonella species NOT DETECTED NOT DETECTED Final   Yersinia enterocolitica NOT DETECTED NOT DETECTED Final   Vibrio species NOT DETECTED NOT DETECTED Final   Vibrio cholerae NOT DETECTED NOT DETECTED Final   Enteroaggregative E coli (EAEC) NOT DETECTED NOT DETECTED Final   Enteropathogenic E coli (EPEC) NOT DETECTED NOT DETECTED Final   Enterotoxigenic E coli (ETEC) NOT DETECTED NOT DETECTED Final   Shiga like toxin producing E coli (STEC) NOT DETECTED NOT DETECTED Final   Shigella/Enteroinvasive E coli (EIEC) NOT DETECTED NOT DETECTED Final   Cryptosporidium NOT DETECTED NOT DETECTED Final   Cyclospora cayetanensis NOT DETECTED NOT DETECTED Final   Entamoeba histolytica NOT DETECTED NOT DETECTED Final   Giardia  lamblia NOT DETECTED NOT DETECTED Final   Adenovirus F40/41 NOT DETECTED NOT DETECTED Final   Astrovirus NOT DETECTED NOT DETECTED Final   Norovirus GI/GII NOT DETECTED NOT DETECTED Final   Rotavirus A NOT DETECTED NOT DETECTED Final   Sapovirus (I, II, IV, and V) NOT DETECTED NOT DETECTED Final    Comment: Performed at [facility], [address], [place], [place] [address]  Resp panel by RT-PCR (RSV, Flu A&B, Covid) Anterior Nasal Swab     Status: None   Collection Time:  06/09/23  9:53 PM   Specimen: Anterior Nasal Swab  Result Value Ref Range Status   SARS Coronavirus 2 by RT PCR NEGATIVE NEGATIVE Final    Comment: (NOTE) SARS-CoV-2 target nucleic acids are NOT DETECTED.  The SARS-CoV-2 RNA is generally detectable in upper respiratory specimens during the acute phase of infection. The lowest concentration of SARS-CoV-2 viral copies this assay can detect is 138 copies/mL. A negative result does not preclude SARS-Cov-2 infection and should not be used as the sole basis for treatment or other patient management decisions. A negative result may occur with  improper specimen collection/handling, submission of specimen other than nasopharyngeal swab, presence of viral mutation(s) within the areas targeted by this assay, and inadequate number of viral copies(<138 copies/mL). A negative result must be combined with clinical observations, patient history, and epidemiological information. The expected result is Negative.  Fact Sheet for Patients:  BloggerCourse.com  Fact Sheet for Healthcare Providers:  SeriousBroker.it  This test is no t yet approved or cleared by the [place]  FDA and  has been authorized for detection and/or diagnosis of SARS-CoV-2 by FDA under an Emergency Use Authorization (EUA). This EUA will remain  in effect (meaning this test can be used) for the duration of the COVID-19 declaration under Section 564(b)(1) of the Act, 21 U.S.C.section 360bbb-3(b)(1), unless the authorization is terminated  or revoked sooner.       Influenza A by PCR NEGATIVE NEGATIVE Final   Influenza B by PCR NEGATIVE NEGATIVE Final    Comment: (NOTE) The Xpert Xpress SARS-CoV-2/FLU/RSV plus assay is intended as an aid in the diagnosis of influenza from Nasopharyngeal swab specimens and should not be used as a sole basis for treatment. Nasal washings and aspirates are unacceptable for Xpert Xpress  SARS-CoV-2/FLU/RSV testing.  Fact Sheet for Patients: BloggerCourse.com  Fact Sheet for Healthcare Providers: SeriousBroker.it  This test is not yet approved or cleared by the [place]  FDA and has been authorized for detection and/or diagnosis of SARS-CoV-2 by FDA under an Emergency Use Authorization (EUA). This EUA will remain in effect (meaning this test can be used) for the duration of the COVID-19 declaration under Section 564(b)(1) of the Act, 21 U.S.C. section 360bbb-3(b)(1), unless the authorization is terminated or revoked.     Resp Syncytial Virus by PCR NEGATIVE NEGATIVE Final    Comment: (NOTE) Fact Sheet for Patients: BloggerCourse.com  Fact Sheet for Healthcare Providers: SeriousBroker.it  This test is not yet approved or cleared by the [place]  FDA and has been authorized for detection and/or diagnosis of SARS-CoV-2 by FDA under an Emergency Use Authorization (EUA). This EUA will remain in effect (meaning this test can be used) for the duration of the COVID-19 declaration under Section 564(b)(1) of the Act, 21 U.S.C. section 360bbb-3(b)(1), unless the authorization is terminated or revoked.  Performed at [facility], [address] [place], [place] [address]   Culture, blood (single)     Status: None (  Preliminary result)   Collection Time: 06/09/23 10:59 PM   Specimen: BLOOD LEFT ARM  Result Value Ref Range Status   Specimen Description   Final    BLOOD LEFT ARM Performed at [facility], [address], [place], [place] [address]    Special Requests   Final    BOTTLES DRAWN AEROBIC AND ANAEROBIC Blood Culture results may not be optimal due to an inadequate volume of blood received in culture bottles Performed at [facility], [address] [place], [place] [address]    Culture  Setup Time   Final    GRAM POSITIVE  COCCI IN BOTH AEROBIC AND ANAEROBIC BOTTLES CRITICAL RESULT CALLED TO, READ BACK BY AND VERIFIED WITH: SHEEMA HALLAJI PHARMD 0911 06/11/23 HNM GRAM STAIN REVIEWED-AGREE WITH RESULT DRT    Culture   Final    CORRECTED RESULTS GAMELLA HAEMOLYSANS PREVIOUSLY REPORTED AS: GAMMA HEMOLYSIS CORRECTED RESULTS CALLED TO: DR Cassandria Clever [id number] AT 1524, ADC ISOLATE REFERED FOR SUSCEPTIBILITY Performed at [facility], [address] [place] [address] [place], [place] [address]    Report Status PENDING  Incomplete  Blood Culture ID Panel (Reflexed)     Status: None   Collection Time: 06/09/23 10:59 PM  Result Value Ref Range Status   Enterococcus faecalis NOT DETECTED NOT DETECTED Final   Enterococcus Faecium NOT DETECTED NOT DETECTED Final   Listeria monocytogenes NOT DETECTED NOT DETECTED Final   Staphylococcus species NOT DETECTED NOT DETECTED Final   Staphylococcus aureus (BCID) NOT DETECTED NOT DETECTED Final   Staphylococcus epidermidis NOT DETECTED NOT DETECTED Final   Staphylococcus lugdunensis NOT DETECTED NOT DETECTED Final   Streptococcus species NOT DETECTED NOT DETECTED Final   Streptococcus agalactiae NOT DETECTED NOT DETECTED Final   Streptococcus pneumoniae NOT DETECTED NOT DETECTED Final   Streptococcus pyogenes NOT DETECTED NOT DETECTED Final   A.calcoaceticus-baumannii NOT DETECTED NOT DETECTED Final   Bacteroides fragilis NOT DETECTED NOT DETECTED Final   Enterobacterales NOT DETECTED NOT DETECTED Final   Enterobacter cloacae complex NOT DETECTED NOT DETECTED Final   Escherichia coli NOT DETECTED NOT DETECTED Final   Klebsiella aerogenes NOT DETECTED NOT DETECTED Final   Klebsiella oxytoca NOT DETECTED NOT DETECTED Final   Klebsiella pneumoniae NOT DETECTED NOT DETECTED Final   Proteus species NOT DETECTED NOT DETECTED Final   Salmonella species NOT DETECTED NOT DETECTED Final   Serratia marcescens NOT DETECTED NOT DETECTED Final   Haemophilus influenzae NOT DETECTED NOT DETECTED  Final   Neisseria meningitidis NOT DETECTED NOT DETECTED Final   Pseudomonas aeruginosa NOT DETECTED NOT DETECTED Final   Stenotrophomonas maltophilia NOT DETECTED NOT DETECTED Final   Candida albicans NOT DETECTED NOT DETECTED Final   Candida auris NOT DETECTED NOT DETECTED Final   Candida glabrata NOT DETECTED NOT DETECTED Final   Candida krusei NOT DETECTED NOT DETECTED Final   Candida parapsilosis NOT DETECTED NOT DETECTED Final   Candida tropicalis NOT DETECTED NOT DETECTED Final   Cryptococcus neoformans/gattii NOT DETECTED NOT DETECTED Final    Comment: Performed at [facility], [address] [place], [place] [address]  Susceptibility, Aer + Anaerob     Status: Abnormal   Collection Time: 06/09/23 10:59 PM  Result Value Ref Range Status   Suscept, Aer + Anaerob Preliminary report (A)  Final    Comment: (NOTE) Performed At: [facility] [address] [place], [place] [id number] Pearlean Botts MD UU:[id number]    Source BLOOD ISOLATE  Final    Comment: Performed  at [facility], [address], [place], [place] [address]  Susceptibility Result     Status: Abnormal   Collection Time: 06/09/23 10:59 PM  Result Value Ref Range Status   Suscept Result 1 Gemella haemolysans (A)  Final    Comment: (NOTE) Identification performed by account, not confirmed by this laboratory. Performed At: [facility] [address] [place], [place] [id number] Pearlean Botts MD AO:[id number]   Blood culture (single)     Status: None   Collection Time: 06/10/23 12:53 AM   Specimen: BLOOD  Result Value Ref Range Status   Specimen Description BLOOD RIGHT ARM  Final   Special Requests   Final    BOTTLES DRAWN AEROBIC AND ANAEROBIC Blood Culture results may not be optimal due to an inadequate volume of blood received in culture bottles   Culture   Final    NO GROWTH 5 DAYS Performed at [facility], [address] [place], [place] [address]     Report Status 06/15/2023 FINAL  Final  C Difficile Quick Screen w PCR reflex     Status: None   Collection Time: 06/11/23  9:28 PM   Specimen: STOOL  Result Value Ref Range Status   C Diff antigen NEGATIVE NEGATIVE Final   C Diff toxin NEGATIVE NEGATIVE Final   C Diff interpretation No C. difficile detected.  Final    Comment: Performed at [facility], [address] [place], [place] [address]  Culture, blood (Routine X 2) w Reflex to ID Panel     Status: None (Preliminary result)   Collection Time: 06/13/23  6:26 AM   Specimen: BLOOD LEFT ARM  Result Value Ref Range Status   Specimen Description   Final    BLOOD LEFT ARM Performed at [facility], [address] [place], [place] [address]    Special Requests   Final    BOTTLES DRAWN AEROBIC AND ANAEROBIC Blood Culture adequate volume   Culture  Setup Time PENDING  Incomplete   Culture   Final    NO GROWTH 3 DAYS Performed at [facility], [address] [place], [place] [address]    Report Status PENDING  Incomplete  Culture, blood (Routine X 2) w Reflex to ID Panel     Status: None (Preliminary result)   Collection Time: 06/13/23  6:29 AM   Specimen: BLOOD  Result Value Ref Range Status   Specimen Description BLOOD BLOOD LEFT HAND  Final   Special Requests   Final    BOTTLES DRAWN AEROBIC AND ANAEROBIC Blood Culture results may not be optimal due to an inadequate volume of blood received in culture bottles   Culture   Final    NO GROWTH 3 DAYS Performed at [facility], [address] [place], [place] [address]    Report Status PENDING  Incomplete      Radiology Studies last 3 days: CT MAXILLOFACIAL WO CONTRAST Result Date: 06/13/2023 CLINICAL DATA:  Family concerned for dental abscess. History of dental disease causing endocarditis. EXAM: CT MAXILLOFACIAL WITHOUT CONTRAST TECHNIQUE: Multidetector CT imaging of the maxillofacial structures was performed. Multiplanar CT image  reconstructions were also generated. RADIATION DOSE REDUCTION: This exam was performed according to the departmental dose-optimization program which includes automated exposure control, adjustment of the mA and/or kV according to patient size and/or use of iterative reconstruction technique. COMPARISON:  Facial CT 03/31/2023. FINDINGS: Osseous: No fracture or mandibular dislocation. No destructive process. Again noted the upper  teeth have all been removed as well as the left mandibular molars and left mandibular second bicuspid. There is metallic artifact from fillings in the right mandibular molars. There are no periapical lucencies around the roots of the remaining teeth and no advanced decay is seen. Orbits: Negative. No traumatic or inflammatory finding. 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 Small retention cyst or polyp posteriorly in the left maxillary sinus. Remaining paranasal sinuses, bilateral mastoid air cells, and bilateral middle ears are clear. The nasal passages and both ostiomeatal complexes are patent. There is an S shaped nasal septum with left-sided spurring. Soft tissues: No abscess or focal inflammatory process is seen. Limited intracranial: Mild cerebral atrophy and small-vessel disease. Calcific plaque in the carotid siphons. No acute findings involving the visualized brain. IMPRESSION: 1. No evidence of dental abscess or advanced decay. 2. No acute facial bone findings. 3. Chronic right frontal sinus osteoma. 4. Small retention cyst or polyp posteriorly in the left maxillary sinus. 5. S shaped nasal septum with left-sided spurring. 6. Mild cerebral atrophy and small-vessel disease. 7. Carotid atherosclerosis. Electronically Signed   By: Denman Fischer M.D.   On: 06/13/2023 23:22   MR LUMBAR SPINE WO CONTRAST Result Date: 06/12/2023 CLINICAL DATA:  Initial evaluation for possible osteomyelitis. History of bacteremia, back pain. EXAM: MRI LUMBAR SPINE WITHOUT CONTRAST  TECHNIQUE: Multiplanar, multisequence MR imaging of the lumbar spine was performed. No intravenous contrast was administered. COMPARISON:  Comparison made with prior CT from 06/10/2023. FINDINGS: Segmentation: Standard. Lowest well-formed disc space labeled the L5-S1 level. Alignment: Trace 2 mm anterolisthesis of L4 on L5. Alignment otherwise normal with preservation of the normal lumbar lordosis. Vertebrae: Complete obliteration of the L5-S1 interspace with bony ankylosis the L5 and S1 vertebral bodies. No associated abnormal marrow edema at this level. No worrisome or aggressive osseous lesions seen at this location on prior CT. Finding is nonspecific, and could be related to prior surgery or possibly remote infection. Again, no associated marrow edema to suggest acute infection at this time. No other evidence for acute infection elsewhere within the lumbar spine. Vertebral body height maintained. Bone marrow signal intensity within normal limits. Few scattered benign hemangiomata noted. No worrisome osseous lesions. Conus medullaris and cauda equina: Conus extends to the L1 level. Conus and cauda equina appear normal. No epidural collections. Paraspinal and other soft tissues: Paraspinous soft tissues demonstrate no acute finding. Multifocal cortical scarring noted about the visualized kidneys. Disc levels: L1-2: Disc desiccation with mild diffuse disc bulge. Mild reactive endplate spurring. Mild bilateral facet hypertrophy. No spinal stenosis. Foramina remain patent. L2-3: Disc desiccation with mild disc bulge. Mild reactive endplate spurring. Mild to moderate facet and ligament flavum hypertrophy. No significant spinal stenosis. Mild left greater than right L2 foraminal stenosis. L3-4: Disc desiccation with mild disc bulge. Moderate bilateral facet hypertrophy. No more than mild spinal stenosis. Mild to moderate left with mild right L3 foraminal narrowing. L4-5: Trace anterolisthesis. Disc desiccation with  mild disc bulge. Severe bilateral facet arthrosis. Resultant moderate spinal stenosis. Mild bilateral L4 foraminal narrowing. 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 Associated mild endplate spurring, slightly asymmetric to the right. Prior right hemi laminectomy. Mild to moderate left greater than right facet hypertrophy. No spinal stenosis. Mild to moderate bilateral L5 foraminal narrowing. IMPRESSION: 1. No MRI evidence for acute infection within the lumbar spine. 2. Complete obliteration of the L5-S1 interspace with bony ankylosis of the L5 and S1  vertebral bodies. Finding is nonspecific, and could be related to prior surgery or possibly remotely healed infection. 3. Multifactorial degenerative changes at L4-5 with resultant moderate spinal stenosis. Mild to moderate bilateral L2 through L5 foraminal narrowing as above. Electronically Signed   By: Virgia Griffins M.D.   On: 06/12/2023 22:38   MR THORACIC SPINE WO CONTRAST Result Date: 06/12/2023 CLINICAL DATA:  Initial evaluation for possible osteomyelitis, bacteremia with back pain. EXAM: MRI THORACIC SPINE WITHOUT CONTRAST TECHNIQUE: Multiplanar, multisequence MR imaging of the thoracic spine was performed. No intravenous contrast was administered. COMPARISON:  Comparison made with prior CT from 06/10/2023. FINDINGS: Alignment: Mild exaggeration of the normal thoracic kyphosis. Trace stepwise degenerative anterolisthesis of T1 on T2 through T3 on T4. Vertebrae: Vertebral body height maintained without acute or chronic fracture. Bone marrow signal intensity within normal limits. Subcentimeter benign hemangioma noted within the T6 vertebral body. No worrisome osseous lesions. No findings to suggest osteomyelitis discitis or septic arthritis. Cord:  Normal signal and morphology.  No epidural collections. Paraspinal and other soft tissues: Paraspinous soft tissues demonstrate no acute finding.  Moderate to large layering bilateral pleural effusions noted. Disc levels: Or narrowing for age multilevel disc desiccation with mild degenerative endplate spurring noted throughout the thoracic spine. No significant disc bulge or focal disc herniation. No spinal stenosis. Foramina remain patent. No neural impingement. IMPRESSION: 1. No MRI evidence for infection within the thoracic spine. 2. Ordinary for age multilevel thoracic spondylosis without significant stenosis or neural impingement. 3. Moderate to large layering bilateral pleural effusions. Electronically Signed   By: Virgia Griffins M.D.   On: 06/12/2023 22:24        Nisreen Guise, DO [facility] 06/16/2023, 2:38 PM    Dictation software may have been used to generate the above note. Typos may occur and escape review in typed/dictated notes. Please contact Dr Authur Leghorn directly for clarity if needed.  Staff may message me via secure chat in Epic  but this may not receive an immediate response,  please page me for urgent matters!  If 7PM-7AM, please contact night coverage [email or web address]

## 2023-06-16 NOTE — Progress Notes (Signed)
 ANTICOAGULATION CONSULT NOTE  Pharmacy Consult for heparin  infusion Indication: afib  No Known Allergies  Patient Measurements: Height: 5\' 5"  (165.1 cm) Weight: 102 kg (224 lb 13.9 oz) IBW/kg (Calculated) : 57 Heparin  Dosing Weight: 80.5 kg  Vital Signs: Temp: 99 F (37.2 C) (02/09 2327) Temp Source: Oral (02/09 2327) BP: 131/64 (02/09 2327) Pulse Rate: 73 (02/09 2327)  Labs: Recent Labs    06/14/23 0341 06/14/23 0344 06/15/23 0342 06/16/23 0404  HGB 8.9*  --  9.1* 8.9*  HCT 28.1*  --  29.0* 28.5*  PLT 168  --  200 201  HEPARINUNFRC 0.60  --  0.59 0.66  CREATININE 2.42* 2.45* 2.19*  --     Estimated Creatinine Clearance: 27.5 mL/min (A) (by C-G formula based on SCr of 2.19 mg/dL (H)).   Medical History: Past Medical History:  Diagnosis Date   Allergy    Anxiety    Arthritis    Asthma    Chronic back pain    Chronic combined systolic (congestive) and diastolic (congestive) heart failure (HCC)    a. 02/2020 Echo: EF 60-65%, no rwma. Gr2 DD. Nl RV size/fxn. Mild LAE. Mild MR; b. 10/2021 Echo: [facility] following MVR) EF 40 to 45%.   Depression    Diverticulitis    Endocarditis    a. 10/2021 S sanguinous bacteremia w/ MV veg-->73mm Mitris bioprosthetic valve, Maze, and LAA clip.   Hyperlipidemia    Hypertension    Lipoma    Mild mitral regurgitation    a. 08/2016 Echo: mild MR; b. 02/2020 Echo: Mild MR.   Neuromuscular disorder (HCC)    Obesity    Obstructive sleep apnea    PAF (paroxysmal atrial fibrillation) (HCC)    a. Dx 05/2016--> Xarelto  (CHA2DS2VASc = 3); b. 08/2016 Echo: EF 55-60%, no rwma, mild MR, mod dil LA; c. 03/2020 [facility]: Avg HR 61 w/ Afib burden of 24% - max rate 189 (avg 93). 2 pauses - longest 4.7 secs post-conversion pauses (occurred @ 5:42 am); d. 04/2022 s/p DCCV; e. 08/2022 s/p DCCV.   Paroxysmal SVT (supraventricular tachycardia) (HCC)    S/P mitral valve replacement    a. 10/2021 [facility] - Endocarditis/MV Veg-->98mm Mitris bioprosthetic valve, Maze,  and LAA clip.   Sleep apnea    Stroke [facility])    a. 02/2020 MRI/A: multifocal acute ischemia in R MCA territory predominantly involving post insula and R middle frontal gyrus. Nl MRA.   Tachy-brady syndrome (HCC)    Tobacco abuse    Medications:  PTA Meds: Rivaroxaban  20 mg qpm, last dose unknown  Assessment: Pt is a 73 yo female presenting to ED c/o "diarrhea x6 days and vomiting. Reports feeling of heart fluttering and pain that started x2 days ago," found with elevated BNP and Troponin I level trending up. Pt has hx of afib and is on rivaroxaban . CHADSVASc 6. Now found to have endocarditis with hx of mitral bioprosthetic valve.   Goal of Therapy:  Heparin  level 0.3-0.7 units/ml Monitor platelets by anticoagulation protocol: Yes  Monitoring: 2/4: aPTT @ 1242 was 39 Subtherapeutic 2/4 2253 aPTT 54, subtherapeutic 2/5 1123 aPTT 78 2/5 2008 aPTT 55  SUBtherapeutic 2/6 0723 aPTT 90 HL 0.36 2/7 0206 HL < 0.10 subtherapeutic (confirmed level with lab) 2/7 1236 HL 0.48, therapeutic at 2100 units/hour 2/7 2054 HL 0.57m therapeutic x 2 2/8 0341 HL 0.60, therapeutic x 3 2/9 0342 HL 0.59, therapeutic x 4 2/10 0404 HL 0.66, therapeutic x 5   Plan: Heparin  level remains therapeutic. Will  continue heparin  infusion at 2100 unit/hr. Recheck heparin  level with AM labs. CBC daily while on heparin .   Coretta Dexter, PharmD, [facility] 06/16/2023 4:57 AM

## 2023-06-16 NOTE — Treatment Plan (Signed)
 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

## 2023-06-16 NOTE — Care Management Important Message (Signed)
 Important Message  Patient Details  Name: Tina Peters MRN: [id number] Date of Birth: May 30, 1950   Important Message Given:  Yes - Medicare IM     Felix Host 06/16/2023, 3:26 PM

## 2023-06-17 ENCOUNTER — Other Ambulatory Visit (HOSPITAL_COMMUNITY): Payer: Self-pay

## 2023-06-17 ENCOUNTER — Telehealth (HOSPITAL_COMMUNITY): Payer: Self-pay | Admitting: Pharmacy Technician

## 2023-06-17 ENCOUNTER — Telehealth: Payer: Self-pay | Admitting: Cardiology

## 2023-06-17 DIAGNOSIS — I4891 Unspecified atrial fibrillation: Secondary | ICD-10-CM | POA: Diagnosis not present

## 2023-06-17 DIAGNOSIS — I059 Rheumatic mitral valve disease, unspecified: Secondary | ICD-10-CM | POA: Diagnosis not present

## 2023-06-17 LAB — BASIC METABOLIC PANEL
Anion gap: 10 (ref 5–15)
BUN: 21 mg/dL (ref 8–23)
CO2: 21 mmol/L — ABNORMAL LOW (ref 22–32)
Calcium: 8.1 mg/dL — ABNORMAL LOW (ref 8.9–10.3)
Chloride: 101 mmol/L (ref 98–111)
Creatinine, Ser: 1.63 mg/dL — ABNORMAL HIGH (ref 0.44–1.00)
GFR, Estimated: 33 mL/min — ABNORMAL LOW (ref >60)
Glucose, Bld: 118 mg/dL — ABNORMAL HIGH (ref 70–99)
Potassium: 4.2 mmol/L (ref 3.5–5.1)
Sodium: 132 mmol/L — ABNORMAL LOW (ref 135–145)

## 2023-06-17 LAB — CBC
HCT: 29.2 % — ABNORMAL LOW (ref 36.0–46.0)
Hemoglobin: 8.9 g/dL — ABNORMAL LOW (ref 12.0–15.0)
MCH: 26.3 pg (ref 26.0–34.0)
MCHC: 30.5 g/dL (ref 30.0–36.0)
MCV: 86.4 fL (ref 80.0–100.0)
Platelets: 198 10*3/uL (ref 150–400)
RBC: 3.38 MIL/uL — ABNORMAL LOW (ref 3.87–5.11)
RDW: 17.9 % — ABNORMAL HIGH (ref 11.5–15.5)
WBC: 12.5 10*3/uL — ABNORMAL HIGH (ref 4.0–10.5)
nRBC: 0.2 % (ref 0.0–0.2)

## 2023-06-17 LAB — SEDIMENTATION RATE: Sed Rate: 52 mm/h — ABNORMAL HIGH (ref 0–30)

## 2023-06-17 LAB — C-REACTIVE PROTEIN: CRP: 5.3 mg/dL — ABNORMAL HIGH (ref <1.0)

## 2023-06-17 LAB — HEPARIN LEVEL (UNFRACTIONATED): Heparin Unfractionated: 0.59 [IU]/mL (ref 0.30–0.70)

## 2023-06-17 MED ORDER — AMIODARONE HCL 200 MG PO TABS: 200.0000 mg | ORAL_TABLET | Freq: Two times a day (BID) | ORAL | Status: DC

## 2023-06-17 MED ORDER — CEFTRIAXONE IV (FOR PTA / DISCHARGE USE ONLY)
2.0000 g | INTRAVENOUS | 0 refills | Status: DC
Start: 2023-06-18 — End: 2023-06-27

## 2023-06-17 MED ORDER — ASPIRIN 81 MG PO TBEC: 81.0000 mg | DELAYED_RELEASE_TABLET | Freq: Every day | ORAL | 12 refills | Status: DC

## 2023-06-17 MED ORDER — METOPROLOL TARTRATE 50 MG PO TABS
50.0000 mg | ORAL_TABLET | Freq: Two times a day (BID) | ORAL | 0 refills | Status: DC
Start: 2023-06-17 — End: 2023-10-24

## 2023-06-17 MED ORDER — APIXABAN 5 MG PO TABS: 5.0000 mg | ORAL_TABLET | Freq: Two times a day (BID) | ORAL | 0 refills | Status: DC

## 2023-06-17 MED ORDER — SODIUM CHLORIDE 0.9 % IV SOLN: 2.0000 g | INTRAVENOUS | Status: DC

## 2023-06-17 MED ORDER — PROBIOTIC (LACTOBACILLUS) PO CAPS: 1.0000 | ORAL_CAPSULE | Freq: Two times a day (BID) | ORAL | 0 refills | Status: DC

## 2023-06-17 MED ORDER — AMIODARONE HCL 200 MG PO TABS: 200.0000 mg | ORAL_TABLET | Freq: Every day | ORAL | Status: DC

## 2023-06-17 MED ORDER — AMIODARONE HCL 200 MG PO TABS
400.0000 mg | ORAL_TABLET | Freq: Two times a day (BID) | ORAL | Status: DC
Start: 2023-06-17 — End: 2023-06-17
  Administered 2023-06-17: 400 mg via ORAL
  Filled 2023-06-17: qty 2

## 2023-06-17 MED ORDER — ONDANSETRON HCL 4 MG PO TABS: 4.0000 mg | ORAL_TABLET | Freq: Four times a day (QID) | ORAL | 0 refills | Status: DC | PRN

## 2023-06-17 MED ORDER — APIXABAN 5 MG PO TABS
5.0000 mg | ORAL_TABLET | Freq: Two times a day (BID) | ORAL | Status: DC
  Administered 2023-06-17: 5 mg via ORAL
  Filled 2023-06-17: qty 1

## 2023-06-17 NOTE — Discharge Summary (Signed)
Physician Discharge Summary   Patient: Tina Peters MRN: [id number]  DOB: [birth year]   Admit:     Date of Admission: 06/09/2023 Admitted from: home   Discharge: Date of discharge: 06/17/23 Disposition: Home w/ home health Condition at discharge: good  CODE STATUS: FULL CODE     Discharge Physician: Sunnie Nielsen, DO [facility]     PCP: Erasmo Downer, MD  Recommendations for Outpatient Follow-up:  Follow up with PCP Beryle Flock Marzella Schlein, MD in 1-2 weeks for hospital follow up  Follow w/ cardiology and ID as directed   Discharge Instructions     [facility] pharmacist to adjust dose for Vancomycin, Aminoglycosides and other anti-infective therapies as requested by physician.   Complete by: As directed    [facility] to provide Cath Flo 2mg    Complete by: As directed    Administer for PICC line occlusion and as ordered by physician for other access device issues.   Anaphylaxis Kit: Provided to treat any anaphylactic reaction to the medication being provided to the patient if First Dose or when requested by physician   Complete by: As directed    Epinephrine 1mg /ml vial / amp: Administer 0.3mg  (0.31ml) subcutaneously once for moderate to severe anaphylaxis, nurse to call physician and pharmacy when reaction occurs and call 911 if needed for immediate care   Diphenhydramine 50mg /ml IV vial: Administer 25-50mg  IV/IM PRN for first dose reaction, rash, itching, mild reaction, nurse to call physician and pharmacy when reaction occurs   Sodium Chloride 0.9% NS IV: Administer if needed for hypovolemic blood pressure drop or as ordered by physician after call to physician with anaphylactic reaction   Change dressing on IV access line weekly and PRN   Complete by: As directed    Diet - low sodium heart healthy   Complete by: As directed    Flush IV access with Sodium Chloride 0.9% and Heparin 10 units/ml or 100 units/ml   Complete by:  As directed    Home infusion instructions - [facility]   Complete by: As directed    Instructions: Flush IV access with Sodium Chloride 0.9% and Heparin 10units/ml or 100units/ml   Change dressing on IV access line: Weekly and PRN   Instructions Cath Flo 2mg : Administer for PICC Line occlusion and as ordered by physician for other access device   [facility] pharmacist to adjust dose for: Vancomycin, Aminoglycosides and other anti-infective therapies as requested by physician   Increase activity slowly   Complete by: As directed    Method of administration may be changed at the discretion of home infusion pharmacist based upon assessment of the patient and/or caregiver's ability to self-administer the medication ordered   Complete by: As directed    No wound care   Complete by: As directed          Discharge Diagnoses: Principal Problem:   Endocarditis of mitral valve Active Problems:   Demand ischemia (HCC)   Acute gastroenteritis   Acute sepsis (HCC)   Hypokalemia   Essential hypertension   OSA (obstructive sleep apnea)   Chronic anticoagulation   CKD stage 3a, GFR 45-59 ml/min (HCC)   HFrEF (heart failure with reduced ejection fraction) (HCC)   Chronic a-fib (HCC)   PAF (paroxysmal atrial fibrillation) (HCC)   Acute bacterial endocarditis   Bacteremia   Atrial fibrillation with RVR (HCC)   Obesity, Class II, BMI 35-39.9   Nausea vomiting and diarrhea  S/P MVR (mitral valve repair)   Elevated troponin   AKI (acute kidney injury) (HCC)   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  Patient states that she got sick last week.  Was seen in the ER on January 26.  She  had palpitations that time.  Workup was negative.  Patient discharged to home.  She followed up in the office and was diagnosed with flulike symptoms.  She had a respiratory viral panel that was negative.  She continued to have diarrhea along with nausea and vomiting.  She states that she felt some tightness in her chest and her abdomen.  She has been vomiting.  She felt some palpitations in the middle of the night.  She came to the ER 02/03 for evaluation.  02/03: to ED evening - elevated BNP, procalcitonin, troponin 283 --> 331 started on heparin, lactate 2.6 -(1L IVF)--> 1.7, started on broad spectrum abx cefepime + vanc + flagyl.  02/04: admitted to hospitalist service w/ concern for gastroenteritis w/ concern for associated sepsis (reasonably ruled out UTI, PNA, colitis or other intraabdominal bacterial infection).  02/05: unfortunately (+)TEE for mitral valve vegetation.  Blood culture (+)gram positive cocci in anaerobic bottle. ID consulted --> await final BCx results, continue ceftriaxone + flagyl, checking CDiff and may need colonoscopy, recs for MRI spine d/t pain/bacteremia. Pt reports diarrhea has improved but still present. CDiff neg. Presented in sinus rhythm with development of A-fib at 21:00 on 06/11/2023, remains in A-fib with ventricular rates largely in the low 100s bpm with brief episodes into the 120s to 140s bpm  02/06: cardiology adjusting amio and beta blocker. MRI T/L spine: no findings to indicate osteomyelitis/discitis. GPC culture reincubated for better growth 02/07: repeat BCx draw this morning   02/08-02/09: no growth thus far on new cultures.. Await final abx recs and outpatient abx / PICC expect for Mon 02/10  02/10: confirmed w/ CT surgery re: no plan for surgical intervention, f/u w/ TEE 6 weeks. 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  Infection likely caught relatively early given no significant valve regurgitation, grossly no abscess formation though will need to be monitored closely  Hx: June 2023 admitted to [facility] Streptococcus sanguinous bacteremia from infected tooth with mitral valve vegetation, at that time underwent mitral valve replacement with bioprosthetic valve MRI spine L and T spine given back pain in both areas --> no evidence osteo/discitis CT maxillofacial no dental abscess  ID following 6 weeks IV Abx w/ ceftriaxone  PICC line placed 02/10 Cardiology following, have discussed w/ CT surgery and recs for NO surgery/replacement at this time, favor plan for repeat TEE in 6  weeks to reevaluate mitral valve   Anemia No s/s bleeding Monitor Hgb/CBC  Acute gastroenteritis - improving but still some diarrhea Clinically, concern for norovirus vs other viral illness, CDiff engative  check stool studies --> negative / nothing detected  Supportive care     Back pain Ruled out osteomyelitis/discitis Tylenol prn mild Tramadol prn mod-severe  Demand ischemia  Elevated troponin Favor demand ischarmia over NSTEMI Monitoring for chest pain   Hypokalemia D/t diarrhea Replace as needed Monitor BMP   Chronic paroxysmal A-fib  On admission, EKG shows normal sinus rhythm.   Had more rapid rate here transiently Reloaded w/ amiodarone po  Toprol switched to lopressor - cardiology is titrating this  Cardiology switched xarelto to  eliquis    On amiodarone Thyroid panel  AKI Improving Follow BMP outpatient   History HFrEF (heart failure with reduced ejection fraction) NOT in exacerbation  On admission, patient has actually intra vascular volume depleted.   Echo this admission with preserved LV systolic function and no focal wall motion abnormality  Cardiology following    Stage 3a chronic kidney disease (HCC) - Baseline creatinine 1.1-1.4 On admission, patient has a history of CKD stage IIIa.  Baseline creatinine 1.1-1.4.  Cr on admission is close to baseline, no AKI Hold her Aldactone and her losartan for now sine BP has been borderline low    OSA (obstructive sleep apnea) On admission continue CPAP   Essential hypertension Home blood pressure medications include Aldactone, Toprol-XL, Cozaar.   hold her Cozaar and Aldactone due to dehydration/AKI, soft BP.   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  Underweight - under 18  overweight - 25 to 29 obese - 30 or more Class 1 obesity: BMI of 30.0 to 34 Class 2 obesity: BMI of 35.0 to 39 Class 3 obesity: BMI of 40.0 to 49 Super Morbid Obesity: BMI 50-59 Super-super Morbid Obesity: BMI 60+ Significantly low or high BMI is associated with higher medical risk.  Weight management advised as adjunct to other disease management and risk reduction treatments              Discharge Instructions  Allergies as of 06/17/2023   No Known Allergies      Medication List     STOP taking these medications    losartan 25 MG tablet Commonly known as: COZAAR   metoprolol succinate 50 MG 24 hr tablet Commonly known as: TOPROL-XL   spironolactone 25 MG tablet Commonly known as: ALDACTONE   Xarelto 20 MG Tabs tablet Generic drug: rivaroxaban       TAKE these  medications    acetaminophen 325 MG tablet Commonly known as: TYLENOL Take by mouth. 4 tablets as needed   albuterol 108 (90 Base) MCG/ACT inhaler Commonly known as: VENTOLIN HFA Inhale 2 puffs into the lungs every 6 (six) hours as needed for wheezing or shortness of breath.   amiodarone 200 MG tablet Commonly known as: PACERONE Take 1 tablet (200 mg total) by mouth daily. Overdue follow up visit.  PLEASE CALL OFFICE TO SCHEDULE APPOINTMENT PRIOR TO NEXT REFILL   apixaban 5 MG Tabs tablet Commonly known as: ELIQUIS Take 1 tablet (5 mg total) by mouth 2 (two) times daily.   aspirin EC 81 MG tablet Take 1 tablet (81 mg  total) by mouth daily. Swallow whole. Start taking on: June 18, 2023   atorvastatin 40 MG tablet Commonly known as: LIPITOR TAKE 1 TABLET BY MOUTH EVERY DAY   cefTRIAXone 2 g in sodium chloride 0.9 % 100 mL Inject 2 g into the vein daily. Start taking on: June 18, 2023   cefTRIAXone IVPB Commonly known as: ROCEPHIN Inject 2 g into the vein daily. Indication:  Bacteremia with prosthetic mitral valve endocarditis First Dose: Yes Last Day of Therapy:  07/22/2023 Labs - Once weekly:  CBC/D, CMP, ESR and CRP Fax weekly lab results  promptly to [id number] Method of administration: IV Push Method of administration may be changed at the discretion of home infusion pharmacist based upon assessment of the patient and/or caregiver's ability to self-administer the medication ordered. Please pull PIC at completion of IV antibiotics Call [phone] twith any questions or critical value Start taking on: June 18, 2023   hydrocortisone 2.5 % cream Apply 1 application topically as needed.   methocarbamol 500 MG tablet Commonly known as: ROBAXIN TAKE 1 TABLET BY MOUTH EVERY 8 HOURS AS NEEDED FOR MUSCLE SPASMS.   metoprolol tartrate 50 MG tablet Commonly known as: LOPRESSOR Take 1 tablet (50 mg total) by mouth 2 (two) times daily.   ondansetron 4 MG  disintegrating tablet Commonly known as: ZOFRAN-ODT Take 1 tablet (4 mg total) by mouth every 8 (eight) hours as needed for nausea or vomiting.   ondansetron 4 MG tablet Commonly known as: ZOFRAN Take 1 tablet (4 mg total) by mouth every 6 (six) hours as needed for nausea.   Probiotic (Lactobacillus) Caps Take 1 capsule by mouth in the morning and at bedtime.   sertraline 50 MG tablet Commonly known as: ZOLOFT TAKE 1 TABLET BY MOUTH EVERY DAY               Discharge Care Instructions  (From admission, onward)           Start     Ordered   06/17/23 0000  Change dressing on IV access line weekly and PRN  (Home infusion instructions - [facility] )        06/17/23 0823             Follow-up Information     Bacigalupo, Marzella Schlein, MD. Schedule an appointment as soon as possible for a visit.   Specialty: Family Medicine Why: hostpial follow up Contact information: [address] [place] [address] [phone]         Yvonne Kendall, MD. Schedule an appointment as soon as possible for a visit.   Specialty: Cardiology Why: hosptial follow up  for endocarditis, will need repeat TEE in 6 weeks Contact information: [address] [place] [address] [phone]                 No Known Allergies   Subjective: pt reports some dyspnea on exertion which she notes has been stable since her heart surgery years ago, no change, she thinks just more tired since being in the hospital. No other concerns. Pain controlled, no CP   Discharge Exam: BP 118/69 (BP Location: Left Arm)   Pulse 94   Temp 98.2 F (36.8 C) (Oral)   Resp 18   Ht 5\' 5"  (1.651 m)   Wt 102 kg   SpO2 95%   BMI 37.42 kg/m  General: Pt is alert, awake, not in acute distress Cardiovascular: RRR, S1/S2 +, no rubs,  no gallops Respiratory: CTA bilaterally, no wheezing, no rhonchi Abdominal: Soft, NT, ND, bowel sounds + Extremities: no  edema, no cyanosis     The results of significant diagnostics from this hospitalization (including imaging, microbiology, ancillary and laboratory) are listed below for reference.     Microbiology: Recent Results (from the past 240 hours)  Gastrointestinal Panel by PCR , Stool     Status: None   Collection Time: 06/09/23 12:42 PM   Specimen: Stool  Result Value Ref Range Status   Campylobacter species NOT DETECTED NOT DETECTED Final   Plesimonas shigelloides NOT DETECTED NOT DETECTED Final   Salmonella species NOT DETECTED NOT DETECTED Final   Yersinia enterocolitica NOT DETECTED NOT DETECTED Final   Vibrio species NOT DETECTED NOT DETECTED Final   Vibrio cholerae NOT DETECTED NOT DETECTED Final   Enteroaggregative E coli (EAEC) NOT DETECTED NOT DETECTED Final   Enteropathogenic E coli (EPEC) NOT DETECTED NOT DETECTED Final   Enterotoxigenic E coli (ETEC) NOT DETECTED NOT DETECTED Final   Shiga like toxin producing E coli (STEC) NOT DETECTED NOT DETECTED Final   Shigella/Enteroinvasive E coli (EIEC) NOT DETECTED NOT DETECTED Final   Cryptosporidium NOT DETECTED NOT DETECTED Final   Cyclospora cayetanensis NOT DETECTED NOT DETECTED Final   Entamoeba histolytica NOT DETECTED NOT DETECTED Final   Giardia lamblia NOT DETECTED NOT DETECTED Final   Adenovirus F40/41 NOT DETECTED NOT DETECTED Final   Astrovirus NOT DETECTED NOT DETECTED Final   Norovirus GI/GII NOT DETECTED NOT DETECTED Final   Rotavirus A NOT DETECTED NOT DETECTED Final   Sapovirus (I, II, IV, and V) NOT DETECTED NOT DETECTED Final    Comment: Performed at [facility], [address] [place], [place] [address]  Resp panel by RT-PCR (RSV, Flu A&B, Covid) Anterior Nasal Swab     Status: None   Collection Time: 06/09/23  9:53 PM   Specimen: Anterior Nasal Swab  Result Value Ref Range Status   SARS Coronavirus 2 by RT PCR NEGATIVE NEGATIVE Final    Comment: (NOTE) SARS-CoV-2 target nucleic acids are NOT  DETECTED.  The SARS-CoV-2 RNA is generally detectable in upper respiratory specimens during the acute phase of infection. The lowest concentration of SARS-CoV-2 viral copies this assay can detect is 138 copies/mL. A negative result does not preclude SARS-Cov-2 infection and should not be used as the sole basis for treatment or other patient management decisions. A negative result may occur with  improper specimen collection/handling, submission of specimen other than nasopharyngeal swab, presence of viral mutation(s) within the areas targeted by this assay, and inadequate number of viral copies(<138 copies/mL). A negative result must be combined with clinical observations, patient history, and epidemiological information. The expected result is Negative.  Fact Sheet for Patients:  BloggerCourse.com  Fact Sheet for Healthcare Providers:  SeriousBroker.it  This test is no t yet approved or cleared by the [place] FDA and  has been authorized for detection and/or diagnosis of SARS-CoV-2 by FDA under an Emergency Use Authorization (EUA). This EUA will remain  in effect (meaning this test can be used) for the duration of the COVID-19 declaration under Section 564(b)(1) of the Act, 21 U.S.C.section 360bbb-3(b)(1), unless the authorization is terminated  or revoked sooner.       Influenza A by PCR NEGATIVE NEGATIVE Final   Influenza B by PCR NEGATIVE NEGATIVE Final    Comment: (NOTE) The Xpert Xpress SARS-CoV-2/FLU/RSV plus assay is intended as an aid in the diagnosis of influenza from  Nasopharyngeal swab specimens and should not be used as a sole basis for treatment. Nasal washings and aspirates are unacceptable for Xpert Xpress SARS-CoV-2/FLU/RSV testing.  Fact Sheet for Patients: BloggerCourse.com  Fact Sheet for Healthcare Providers: SeriousBroker.it  This test is not yet  approved or cleared by the [place] FDA and has been authorized for detection and/or diagnosis of SARS-CoV-2 by FDA under an Emergency Use Authorization (EUA). This EUA will remain in effect (meaning this test can be used) for the duration of the COVID-19 declaration under Section 564(b)(1) of the Act, 21 U.S.C. section 360bbb-3(b)(1), unless the authorization is terminated or revoked.     Resp Syncytial Virus by PCR NEGATIVE NEGATIVE Final    Comment: (NOTE) Fact Sheet for Patients: BloggerCourse.com  Fact Sheet for Healthcare Providers: SeriousBroker.it  This test is not yet approved or cleared by the [place] FDA and has been authorized for detection and/or diagnosis of SARS-CoV-2 by FDA under an Emergency Use Authorization (EUA). This EUA will remain in effect (meaning this test can be used) for the duration of the COVID-19 declaration under Section 564(b)(1) of the Act, 21 U.S.C. section 360bbb-3(b)(1), unless the authorization is terminated or revoked.  Performed at [facility], [address] [place], [place] [address]   Culture, blood (single)     Status: None (Preliminary result)   Collection Time: 06/09/23 10:59 PM   Specimen: BLOOD LEFT ARM  Result Value Ref Range Status   Specimen Description   Final    BLOOD LEFT ARM Performed at [facility], [address] [place], [place] [address]    Special Requests   Final    BOTTLES DRAWN AEROBIC AND ANAEROBIC Blood Culture results may not be optimal due to an inadequate volume of blood received in culture bottles Performed at [facility], [address] [place], [place] [address]    Culture  Setup Time   Final    GRAM POSITIVE COCCI IN BOTH AEROBIC AND ANAEROBIC BOTTLES CRITICAL RESULT CALLED TO, READ BACK BY AND VERIFIED WITH: SHEEMA HALLAJI PHARMD 0911 06/11/23 [facility] GRAM STAIN REVIEWED-AGREE WITH RESULT DRT    Culture   Final     CORRECTED RESULTS GAMELLA HAEMOLYSANS PREVIOUSLY REPORTED AS: GAMMA HEMOLYSIS CORRECTED RESULTS CALLED TO: DR Noralee Space [id number] AT 1524, ADC ISOLATE REFERED FOR SUSCEPTIBILITY Performed at [facility], [address], [place], [place] [address]    Report Status PENDING  Incomplete  Blood Culture ID Panel (Reflexed)     Status: None   Collection Time: 06/09/23 10:59 PM  Result Value Ref Range Status   Enterococcus faecalis NOT DETECTED NOT DETECTED Final   Enterococcus Faecium NOT DETECTED NOT DETECTED Final   Listeria monocytogenes NOT DETECTED NOT DETECTED Final   Staphylococcus species NOT DETECTED NOT DETECTED Final   Staphylococcus aureus (BCID) NOT DETECTED NOT DETECTED Final   Staphylococcus epidermidis NOT DETECTED NOT DETECTED Final   Staphylococcus lugdunensis NOT DETECTED NOT DETECTED Final   Streptococcus species NOT DETECTED NOT DETECTED Final   Streptococcus agalactiae NOT DETECTED NOT DETECTED Final   Streptococcus pneumoniae NOT DETECTED NOT DETECTED Final   Streptococcus pyogenes NOT DETECTED NOT DETECTED Final   A.calcoaceticus-baumannii NOT DETECTED NOT DETECTED Final   Bacteroides fragilis NOT DETECTED NOT DETECTED Final   Enterobacterales NOT DETECTED NOT DETECTED Final   Enterobacter cloacae complex NOT DETECTED NOT DETECTED Final   Escherichia coli NOT DETECTED NOT DETECTED Final   Klebsiella aerogenes NOT DETECTED NOT DETECTED Final  Klebsiella oxytoca NOT DETECTED NOT DETECTED Final   Klebsiella pneumoniae NOT DETECTED NOT DETECTED Final   Proteus species NOT DETECTED NOT DETECTED Final   Salmonella species NOT DETECTED NOT DETECTED Final   Serratia marcescens NOT DETECTED NOT DETECTED Final   Haemophilus influenzae NOT DETECTED NOT DETECTED Final   Neisseria meningitidis NOT DETECTED NOT DETECTED Final   Pseudomonas aeruginosa NOT DETECTED NOT DETECTED Final   Stenotrophomonas maltophilia NOT DETECTED NOT DETECTED Final   Candida albicans NOT  DETECTED NOT DETECTED Final   Candida auris NOT DETECTED NOT DETECTED Final   Candida glabrata NOT DETECTED NOT DETECTED Final   Candida krusei NOT DETECTED NOT DETECTED Final   Candida parapsilosis NOT DETECTED NOT DETECTED Final   Candida tropicalis NOT DETECTED NOT DETECTED Final   Cryptococcus neoformans/gattii NOT DETECTED NOT DETECTED Final    Comment: Performed at [facility], [address] [place], [place] [address]  Susceptibility, Aer + Anaerob     Status: Abnormal   Collection Time: 06/09/23 10:59 PM  Result Value Ref Range Status   Suscept, Aer + Anaerob Preliminary report (A)  Final    Comment: (NOTE) Performed At: [facility] [address] [place], [place] [id number] Jolene Schimke MD IE:[id number]    Source BLOOD ISOLATE  Final    Comment: Performed at [facility], [address] [place], [place] [address]  Susceptibility Result     Status: Abnormal   Collection Time: 06/09/23 10:59 PM  Result Value Ref Range Status   Suscept Result 1 Gemella haemolysans (A)  Final    Comment: (NOTE) Identification performed by account, not confirmed by this laboratory. Performed At: [facility] [address] [place], [place] [id number] Jolene Schimke MD TF:[id number]   Blood culture (single)     Status: None   Collection Time: 06/10/23 12:53 AM   Specimen: BLOOD  Result Value Ref Range Status   Specimen Description BLOOD RIGHT ARM  Final   Special Requests   Final    BOTTLES DRAWN AEROBIC AND ANAEROBIC Blood Culture results may not be optimal due to an inadequate volume of blood received in culture bottles   Culture   Final    NO GROWTH 5 DAYS Performed at [facility], [address], [place], [place] [address]    Report Status 06/15/2023 FINAL  Final  C Difficile Quick Screen w PCR reflex     Status: None   Collection Time: 06/11/23  9:28 PM   Specimen: STOOL  Result Value Ref Range Status   C Diff antigen NEGATIVE  NEGATIVE Final   C Diff toxin NEGATIVE NEGATIVE Final   C Diff interpretation No C. difficile detected.  Final    Comment: Performed at [facility], [address] [place], [place] [address]  Culture, blood (Routine X 2) w Reflex to ID Panel     Status: None (Preliminary result)   Collection Time: 06/13/23  6:26 AM   Specimen: BLOOD LEFT ARM  Result Value Ref Range Status   Specimen Description   Final    BLOOD LEFT ARM Performed at [facility], [address], [place], [place] [address]    Special Requests   Final    BOTTLES DRAWN AEROBIC AND ANAEROBIC Blood Culture adequate volume   Culture   Final    NO GROWTH 4 DAYS Performed at [facility], [address], [place], [place] [address]    Report Status PENDING  Incomplete  Culture,  blood (Routine X 2) w Reflex to ID Panel     Status: None (Preliminary result)   Collection Time: 06/13/23  6:29 AM   Specimen: BLOOD  Result Value Ref Range Status   Specimen Description BLOOD BLOOD LEFT HAND  Final   Special Requests   Final    BOTTLES DRAWN AEROBIC AND ANAEROBIC Blood Culture results may not be optimal due to an inadequate volume of blood received in culture bottles   Culture   Final    NO GROWTH 4 DAYS Performed at [facility], [address] [place], [place] [address]    Report Status PENDING  Incomplete     Labs: BNP (last 3 results) Recent Labs    06/09/23 2153  BNP 720.2*   Basic Metabolic Panel: Recent Labs  Lab 06/10/23 1531 06/11/23 0635 06/14/23 0341 06/14/23 0344 06/15/23 0342 06/17/23 0554  NA 137 135 137 135 134* 132*  K 3.7 3.6 3.8 3.8 4.1 4.2  CL 103 106 106 105 104 101  CO2 24 22 21* 21* 20* 21*  GLUCOSE 130* 99 111* 111* 104* 118*  BUN 15 13 21 22 22 21   CREATININE 1.12* 1.02* 2.42* 2.45* 2.19* 1.63*  CALCIUM 7.9* 7.8* 8.2* 8.0* 8.3* 8.1*  MG 2.3  --  2.1  --   --   --   PHOS  --   --   --  4.3  --   --    Liver Function Tests: Recent Labs  Lab  06/11/23 0635 06/14/23 0344  AST 27  --   ALT 19  --   ALKPHOS 77  --   BILITOT 1.0  --   PROT 5.7*  --   ALBUMIN 2.4* 2.6*   No results for input(s): "LIPASE", "AMYLASE" in the last 168 hours. No results for input(s): "AMMONIA" in the last 168 hours. CBC: Recent Labs  Lab 06/10/23 1531 06/11/23 6045 06/12/23 0723 06/13/23 0206 06/14/23 0341 06/15/23 0342 06/16/23 0404 06/17/23 0554  WBC 22.2* 16.8*   < > 14.7* 12.7* 12.7* 10.9* 12.5*  NEUTROABS 20.1* 13.8*  --   --   --   --   --   --   HGB 9.4* 8.5*   < > 10.2* 8.9* 9.1* 8.9* 8.9*  HCT 29.7* 27.5*   < > 33.0* 28.1* 29.0* 28.5* 29.2*  MCV 84.9 84.1   < > 86.2 84.1 85.8 86.4 86.4  PLT 178 160   < > 192 168 200 201 198   < > = values in this interval not displayed.   Cardiac Enzymes: No results for input(s): "CKTOTAL", "CKMB", "CKMBINDEX", "TROPONINI" in the last 168 hours. BNP: Invalid input(s): "POCBNP" CBG: No results for input(s): "GLUCAP" in the last 168 hours. D-Dimer No results for input(s): "DDIMER" in the last 72 hours. Hgb A1c No results for input(s): "HGBA1C" in the last 72 hours. Lipid Profile No results for input(s): "CHOL", "HDL", "LDLCALC", "TRIG", "CHOLHDL", "LDLDIRECT" in the last 72 hours. Thyroid function studies No results for input(s): "TSH", "T4TOTAL", "T3FREE", "THYROIDAB" in the last 72 hours.  Invalid input(s): "FREET3" Anemia work up No results for input(s): "VITAMINB12", "FOLATE", "FERRITIN", "TIBC", "IRON", "RETICCTPCT" in the last 72 hours. Urinalysis    Component Value Date/Time   COLORURINE YELLOW (A) 06/09/2023 2153   APPEARANCEUR CLEAR (A) 06/09/2023 2153   LABSPEC 1.028 06/09/2023 2153   PHURINE 6.0 06/09/2023 2153   GLUCOSEU NEGATIVE 06/09/2023 2153   HGBUR SMALL (A) 06/09/2023 2153   BILIRUBINUR NEGATIVE 06/09/2023  2153   KETONESUR NEGATIVE 06/09/2023 2153   PROTEINUR NEGATIVE 06/09/2023 2153   NITRITE NEGATIVE 06/09/2023 2153   LEUKOCYTESUR NEGATIVE 06/09/2023 2153    Sepsis Labs Recent Labs  Lab 06/14/23 0341 06/15/23 0342 06/16/23 0404 06/17/23 0554  WBC 12.7* 12.7* 10.9* 12.5*   Microbiology Recent Results (from the past 240 hours)  Gastrointestinal Panel by PCR , Stool     Status: None   Collection Time: 06/09/23 12:42 PM   Specimen: Stool  Result Value Ref Range Status   Campylobacter species NOT DETECTED NOT DETECTED Final   Plesimonas shigelloides NOT DETECTED NOT DETECTED Final   Salmonella species NOT DETECTED NOT DETECTED Final   Yersinia enterocolitica NOT DETECTED NOT DETECTED Final   Vibrio species NOT DETECTED NOT DETECTED Final   Vibrio cholerae NOT DETECTED NOT DETECTED Final   Enteroaggregative E coli (EAEC) NOT DETECTED NOT DETECTED Final   Enteropathogenic E coli (EPEC) NOT DETECTED NOT DETECTED Final   Enterotoxigenic E coli (ETEC) NOT DETECTED NOT DETECTED Final   Shiga like toxin producing E coli (STEC) NOT DETECTED NOT DETECTED Final   Shigella/Enteroinvasive E coli (EIEC) NOT DETECTED NOT DETECTED Final   Cryptosporidium NOT DETECTED NOT DETECTED Final   Cyclospora cayetanensis NOT DETECTED NOT DETECTED Final   Entamoeba histolytica NOT DETECTED NOT DETECTED Final   Giardia lamblia NOT DETECTED NOT DETECTED Final   Adenovirus F40/41 NOT DETECTED NOT DETECTED Final   Astrovirus NOT DETECTED NOT DETECTED Final   Norovirus GI/GII NOT DETECTED NOT DETECTED Final   Rotavirus A NOT DETECTED NOT DETECTED Final   Sapovirus (I, II, IV, and V) NOT DETECTED NOT DETECTED Final    Comment: Performed at [facility], [address] [place], [place] [address]  Resp panel by RT-PCR (RSV, Flu A&B, Covid) Anterior Nasal Swab     Status: None   Collection Time: 06/09/23  9:53 PM   Specimen: Anterior Nasal Swab  Result Value Ref Range Status   SARS Coronavirus 2 by RT PCR NEGATIVE NEGATIVE Final    Comment: (NOTE) SARS-CoV-2 target nucleic acids are NOT DETECTED.  The SARS-CoV-2 RNA is generally detectable in upper  respiratory specimens during the acute phase of infection. The lowest concentration of SARS-CoV-2 viral copies this assay can detect is 138 copies/mL. A negative result does not preclude SARS-Cov-2 infection and should not be used as the sole basis for treatment or other patient management decisions. A negative result may occur with  improper specimen collection/handling, submission of specimen other than nasopharyngeal swab, presence of viral mutation(s) within the areas targeted by this assay, and inadequate number of viral copies(<138 copies/mL). A negative result must be combined with clinical observations, patient history, and epidemiological information. The expected result is Negative.  Fact Sheet for Patients:  BloggerCourse.com  Fact Sheet for Healthcare Providers:  SeriousBroker.it  This test is no t yet approved or cleared by the [place] FDA and  has been authorized for detection and/or diagnosis of SARS-CoV-2 by FDA under an Emergency Use Authorization (EUA). This EUA will remain  in effect (meaning this test can be used) for the duration of the COVID-19 declaration under Section 564(b)(1) of the Act, 21 U.S.C.section 360bbb-3(b)(1), unless the authorization is terminated  or revoked sooner.       Influenza A by PCR NEGATIVE NEGATIVE Final   Influenza B by PCR NEGATIVE NEGATIVE Final    Comment: (NOTE) The Xpert Xpress SARS-CoV-2/FLU/RSV plus assay is intended as an aid in the diagnosis of influenza  from Nasopharyngeal swab specimens and should not be used as a sole basis for treatment. Nasal washings and aspirates are unacceptable for Xpert Xpress SARS-CoV-2/FLU/RSV testing.  Fact Sheet for Patients: BloggerCourse.com  Fact Sheet for Healthcare Providers: SeriousBroker.it  This test is not yet approved or cleared by the [place] FDA and has been  authorized for detection and/or diagnosis of SARS-CoV-2 by FDA under an Emergency Use Authorization (EUA). This EUA will remain in effect (meaning this test can be used) for the duration of the COVID-19 declaration under Section 564(b)(1) of the Act, 21 U.S.C. section 360bbb-3(b)(1), unless the authorization is terminated or revoked.     Resp Syncytial Virus by PCR NEGATIVE NEGATIVE Final    Comment: (NOTE) Fact Sheet for Patients: BloggerCourse.com  Fact Sheet for Healthcare Providers: SeriousBroker.it  This test is not yet approved or cleared by the [place] FDA and has been authorized for detection and/or diagnosis of SARS-CoV-2 by FDA under an Emergency Use Authorization (EUA). This EUA will remain in effect (meaning this test can be used) for the duration of the COVID-19 declaration under Section 564(b)(1) of the Act, 21 U.S.C. section 360bbb-3(b)(1), unless the authorization is terminated or revoked.  Performed at [facility], [address] [place], [place] [address]   Culture, blood (single)     Status: None (Preliminary result)   Collection Time: 06/09/23 10:59 PM   Specimen: BLOOD LEFT ARM  Result Value Ref Range Status   Specimen Description   Final    BLOOD LEFT ARM Performed at [facility], [address], [place], [place] [address]    Special Requests   Final    BOTTLES DRAWN AEROBIC AND ANAEROBIC Blood Culture results may not be optimal due to an inadequate volume of blood received in culture bottles Performed at [facility], [address] [place], [place] [address]    Culture  Setup Time   Final    GRAM POSITIVE COCCI IN BOTH AEROBIC AND ANAEROBIC BOTTLES CRITICAL RESULT CALLED TO, READ BACK BY AND VERIFIED WITH: SHEEMA HALLAJI PHARMD 0911 06/11/23 HNM GRAM STAIN REVIEWED-AGREE WITH RESULT DRT    Culture   Final    CORRECTED RESULTS GAMELLA HAEMOLYSANS PREVIOUSLY REPORTED  AS: GAMMA HEMOLYSIS CORRECTED RESULTS CALLED TO: DR Noralee Space [id number] AT 1524, ADC ISOLATE REFERED FOR SUSCEPTIBILITY Performed at [facility], [address], [place], [place] [address]    Report Status PENDING  Incomplete  Blood Culture ID Panel (Reflexed)     Status: None   Collection Time: 06/09/23 10:59 PM  Result Value Ref Range Status   Enterococcus faecalis NOT DETECTED NOT DETECTED Final   Enterococcus Faecium NOT DETECTED NOT DETECTED Final   Listeria monocytogenes NOT DETECTED NOT DETECTED Final   Staphylococcus species NOT DETECTED NOT DETECTED Final   Staphylococcus aureus (BCID) NOT DETECTED NOT DETECTED Final   Staphylococcus epidermidis NOT DETECTED NOT DETECTED Final   Staphylococcus lugdunensis NOT DETECTED NOT DETECTED Final   Streptococcus species NOT DETECTED NOT DETECTED Final   Streptococcus agalactiae NOT DETECTED NOT DETECTED Final   Streptococcus pneumoniae NOT DETECTED NOT DETECTED Final   Streptococcus pyogenes NOT DETECTED NOT DETECTED Final   A.calcoaceticus-baumannii NOT DETECTED NOT DETECTED Final   Bacteroides fragilis NOT DETECTED NOT DETECTED Final   Enterobacterales NOT DETECTED NOT DETECTED Final   Enterobacter cloacae complex NOT DETECTED NOT DETECTED Final   Escherichia coli NOT DETECTED NOT DETECTED Final   Klebsiella aerogenes NOT DETECTED NOT DETECTED Final  Klebsiella oxytoca NOT DETECTED NOT DETECTED Final   Klebsiella pneumoniae NOT DETECTED NOT DETECTED Final   Proteus species NOT DETECTED NOT DETECTED Final   Salmonella species NOT DETECTED NOT DETECTED Final   Serratia marcescens NOT DETECTED NOT DETECTED Final   Haemophilus influenzae NOT DETECTED NOT DETECTED Final   Neisseria meningitidis NOT DETECTED NOT DETECTED Final   Pseudomonas aeruginosa NOT DETECTED NOT DETECTED Final   Stenotrophomonas maltophilia NOT DETECTED NOT DETECTED Final   Candida albicans NOT DETECTED NOT DETECTED Final   Candida auris NOT DETECTED NOT  DETECTED Final   Candida glabrata NOT DETECTED NOT DETECTED Final   Candida krusei NOT DETECTED NOT DETECTED Final   Candida parapsilosis NOT DETECTED NOT DETECTED Final   Candida tropicalis NOT DETECTED NOT DETECTED Final   Cryptococcus neoformans/gattii NOT DETECTED NOT DETECTED Final    Comment: Performed at [facility], [address] [place], [place] [address]  Susceptibility, Aer + Anaerob     Status: Abnormal   Collection Time: 06/09/23 10:59 PM  Result Value Ref Range Status   Suscept, Aer + Anaerob Preliminary report (A)  Final    Comment: (NOTE) Performed At: [facility] [address] [place], [place] [id number] Jolene Schimke MD UU:[id number]    Source BLOOD ISOLATE  Final    Comment: Performed at [facility], [address], [place], [place] [address]  Susceptibility Result     Status: Abnormal   Collection Time: 06/09/23 10:59 PM  Result Value Ref Range Status   Suscept Result 1 Gemella haemolysans (A)  Final    Comment: (NOTE) Identification performed by account, not confirmed by this laboratory. Performed At: [facility] [address] [place], [place] [id number] Jolene Schimke MD PP:[id number]   Blood culture (single)     Status: None   Collection Time: 06/10/23 12:53 AM   Specimen: BLOOD  Result Value Ref Range Status   Specimen Description BLOOD RIGHT ARM  Final   Special Requests   Final    BOTTLES DRAWN AEROBIC AND ANAEROBIC Blood Culture results may not be optimal due to an inadequate volume of blood received in culture bottles   Culture   Final    NO GROWTH 5 DAYS Performed at [facility], [address] [place], [place] [address]    Report Status 06/15/2023 FINAL  Final  C Difficile Quick Screen w PCR reflex     Status: None   Collection Time: 06/11/23  9:28 PM   Specimen: STOOL  Result Value Ref Range Status   C Diff antigen NEGATIVE NEGATIVE Final   C Diff toxin NEGATIVE NEGATIVE Final   C Diff  interpretation No C. difficile detected.  Final    Comment: Performed at [facility], [address] [place], [place] [address]  Culture, blood (Routine X 2) w Reflex to ID Panel     Status: None (Preliminary result)   Collection Time: 06/13/23  6:26 AM   Specimen: BLOOD LEFT ARM  Result Value Ref Range Status   Specimen Description   Final    BLOOD LEFT ARM Performed at [facility], [address], [place], [place] [address]    Special Requests   Final    BOTTLES DRAWN AEROBIC AND ANAEROBIC Blood Culture adequate volume   Culture   Final    NO GROWTH 4 DAYS Performed at [facility], [address], [place], [place] [address]    Report Status PENDING  Incomplete  Culture,  blood (Routine X 2) w Reflex to ID Panel     Status: None (Preliminary result)   Collection Time: 06/13/23  6:29 AM   Specimen: BLOOD  Result Value Ref Range Status   Specimen Description BLOOD BLOOD LEFT HAND  Final   Special Requests   Final    BOTTLES DRAWN AEROBIC AND ANAEROBIC Blood Culture results may not be optimal due to an inadequate volume of blood received in culture bottles   Culture   Final    NO GROWTH 4 DAYS Performed at [facility], [address], [place], [place] [address]    Report Status PENDING  Incomplete   Imaging MR LUMBAR SPINE WO CONTRAST Result Date: 06/12/2023 CLINICAL DATA:  Initial evaluation for possible osteomyelitis. History of bacteremia, back pain. EXAM: MRI LUMBAR SPINE WITHOUT CONTRAST TECHNIQUE: Multiplanar, multisequence MR imaging of the lumbar spine was performed. No intravenous contrast was administered. COMPARISON:  Comparison made with prior CT from 06/10/2023. FINDINGS: Segmentation: Standard. Lowest well-formed disc space labeled the L5-S1 level. Alignment: Trace 2 mm anterolisthesis of L4 on L5. Alignment otherwise normal with preservation of the normal lumbar lordosis. Vertebrae: Complete obliteration of the L5-S1 interspace with  bony ankylosis the L5 and S1 vertebral bodies. No associated abnormal marrow edema at this level. No worrisome or aggressive osseous lesions seen at this location on prior CT. Finding is nonspecific, and could be related to prior surgery or possibly remote infection. Again, no associated marrow edema to suggest acute infection at this time. No other evidence for acute infection elsewhere within the lumbar spine. Vertebral body height maintained. Bone marrow signal intensity within normal limits. Few scattered benign hemangiomata noted. No worrisome osseous lesions. Conus medullaris and cauda equina: Conus extends to the L1 level. Conus and cauda equina appear normal. No epidural collections. Paraspinal and other soft tissues: Paraspinous soft tissues demonstrate no acute finding. Multifocal cortical scarring noted about the visualized kidneys. Disc levels: L1-2: Disc desiccation with mild diffuse disc bulge. Mild reactive endplate spurring. Mild bilateral facet hypertrophy. No spinal stenosis. Foramina remain patent. L2-3: Disc desiccation with mild disc bulge. Mild reactive endplate spurring. Mild to moderate facet and ligament flavum hypertrophy. No significant spinal stenosis. Mild left greater than right L2 foraminal stenosis. L3-4: Disc desiccation with mild disc bulge. Moderate bilateral facet hypertrophy. No more than mild spinal stenosis. Mild to moderate left with mild right L3 foraminal narrowing. L4-5: Trace anterolisthesis. Disc desiccation with mild disc bulge. Severe bilateral facet arthrosis. Resultant moderate spinal stenosis. Mild bilateral L4 foraminal narrowing. 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 Associated mild endplate spurring, slightly asymmetric to the right. Prior right hemi laminectomy. Mild to moderate left greater than right facet hypertrophy. No spinal stenosis. Mild to moderate bilateral L5 foraminal narrowing.  IMPRESSION: 1. No MRI evidence for acute infection within the lumbar spine. 2. 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 Finding is nonspecific, and could be related to prior surgery or possibly remotely healed infection. 3. Multifactorial degenerative changes at L4-5 with resultant moderate spinal stenosis. Mild to moderate bilateral L2 through L5 foraminal narrowing as above. Electronically Signed   By: Rise Mu M.D.   On: 06/12/2023 22:38   MR THORACIC SPINE WO CONTRAST Result Date: 06/12/2023 CLINICAL DATA:  Initial evaluation for possible osteomyelitis, bacteremia with back pain. EXAM: MRI THORACIC SPINE WITHOUT CONTRAST TECHNIQUE: Multiplanar, multisequence MR imaging of the thoracic spine  was performed. No intravenous contrast was administered. COMPARISON:  Comparison made with prior CT from 06/10/2023. FINDINGS: Alignment: Mild exaggeration of the normal thoracic kyphosis. Trace stepwise degenerative anterolisthesis of T1 on T2 through T3 on T4. Vertebrae: Vertebral body height maintained without acute or chronic fracture. Bone marrow signal intensity within normal limits. Subcentimeter benign hemangioma noted within the T6 vertebral body. No worrisome osseous lesions. No findings to suggest osteomyelitis discitis or septic arthritis. Cord:  Normal signal and morphology.  No epidural collections. Paraspinal and other soft tissues: Paraspinous soft tissues demonstrate no acute finding. Moderate to large layering bilateral pleural effusions noted. Disc levels: Or narrowing for age multilevel disc desiccation with mild degenerative endplate spurring noted throughout the thoracic spine. No significant disc bulge or focal disc herniation. No spinal stenosis. Foramina remain patent. No neural impingement. IMPRESSION: 1. No MRI evidence for infection within the thoracic spine. 2. Ordinary for age multilevel thoracic spondylosis without  significant stenosis or neural impingement. 3. Moderate to large layering bilateral pleural effusions. Electronically Signed   By: Rise Mu M.D.   On: 06/12/2023 22:24   ECHO TEE Result Date: 06/11/2023    TRANSESOPHOGEAL ECHO REPORT   Patient Name:   SANDREA BOER Date of Exam: 06/11/2023 Medical Rec #:  [id number]       Height:       65.0 in Accession #:    [id number]      Weight:       224.9 lb Date of Birth:  1951-04-20       BSA:          2.079 m Patient Age:    72 years        BP:           95/59 mmHg Patient Gender: F               HR:           67 bpm. 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 Risk Factors:Hypertension. S/P Bovine mitral                  valve replacement.                   Mitral Valve: bioprosthetic valve valve is present in the                  mitral position.  Sonographer:     Cristela Blue Referring Phys:  [id number] CHRISTOPHER RONALD BERGE Diagnosing Phys: Julien Nordmann MD PROCEDURE: After discussion of the risks and benefits of a TEE, an informed consent was obtained from the patient. TEE procedure time was 30 minutes. The transesophogeal probe was passed without difficulty through the esophogus of the patient. Local oropharyngeal anesthetic was provided with Cetacaine and viscous lidocaine. Sedation performed by different physician. Image quality was excellent. The patient's vital signs; including heart rate, blood pressure, and oxygen saturation; remained stable throughout the procedure. The patient developed no complications during the procedure.  IMPRESSIONS  1. Left ventricular ejection fraction, by estimation, is 55 to 60%. The left ventricle  has normal function. The left ventricle has no regional wall  motion abnormalities.  2. Right ventricular systolic function is mildly reduced. The right ventricular size is normal.  3. Left atrial size was severely dilated. No left atrial/left atrial appendage thrombus was detected.  4. Right atrial size was mildly dilated.  5. The mitral valve has been repaired/replaced. No evidence of mitral valve regurgitation. Mild mitral stenosis. The mean mitral valve gradient is 7.0 mmHg. There is a bioprosthetic valve present in the mitral position. Echo findings are consistent with  vegetation of the posterior mitral prosthesis, with significant thickening and a small mobile opacity coming from the posterior leaflet.  6. Tricuspid valve regurgitation is moderate.  7. The aortic valve is normal in structure. There is mild calcification of the aortic valve. Aortic valve regurgitation is not visualized. Aortic valve sclerosis is present, with no evidence of aortic valve stenosis.  8. The inferior vena cava is normal in size with greater than 50% respiratory variability, suggesting right atrial pressure of 3 mmHg.  9. Agitated saline contrast bubble study was negative, with no evidence of any interatrial shunt. Conclusion(s)/Recommendation(s): Thickening of MV posterior leflet with mobile vegetation noted FINDINGS  Left Ventricle: Left ventricular ejection fraction, by estimation, is 55 to 60%. The left ventricle has normal function. The left ventricle has no regional wall motion abnormalities. The left ventricular internal cavity size was normal in size. There is  no left ventricular hypertrophy. Right Ventricle: The right ventricular size is normal. No increase in right ventricular wall thickness. Right ventricular systolic function is mildly reduced. Left Atrium: Left atrial size was severely dilated. No left atrial/left atrial appendage thrombus was detected. Right Atrium: Right atrial size was mildly dilated. Pericardium: There is no evidence of pericardial effusion. Mitral Valve:  The mitral valve has been repaired/replaced. There is severe thickening of the mitral valve leaflet(s). No evidence of mitral valve regurgitation. There is a bioprosthetic valve present in the mitral position. Echo findings are consistent with vegetation on the mitral prosthesis. Mild mitral valve stenosis. MV peak gradient, 22.3 mmHg. The mean mitral valve gradient is 7.0 mmHg. Tricuspid Valve: The tricuspid valve is normal in structure. Tricuspid valve regurgitation is moderate . No evidence of tricuspid stenosis. Aortic Valve: The aortic valve is normal in structure. There is mild calcification of the aortic valve. Aortic valve regurgitation is not visualized. Aortic valve sclerosis is present, with no evidence of aortic valve stenosis. Pulmonic Valve: The pulmonic valve was normal in structure. Pulmonic valve regurgitation is not visualized. No evidence of pulmonic stenosis. Aorta: The aortic root is normal in size and structure. Venous: The inferior vena cava is normal in size with greater than 50% respiratory variability, suggesting right atrial pressure of 3 mmHg. IAS/Shunts: No atrial level shunt detected by color flow Doppler. Agitated saline contrast was given intravenously to evaluate for intracardiac shunting. Agitated saline contrast bubble study was negative, with no evidence of any interatrial shunt. There  is no evidence of a patent foramen ovale. There is no evidence of an atrial septal defect.  MITRAL VALVE MV Area (PHT): 1.46 cm MV Peak grad:  22.3 mmHg MV Mean grad:  7.0 mmHg MV Vmax:       2.36 m/s MV Vmean:      107.0 cm/s MV Decel Time: 518 msec MV E velocity: 198.00 cm/s MV A velocity: 41.20 cm/s MV E/A ratio:  4.81 Julien Nordmann MD Electronically signed by Julien Nordmann MD Signature Date/Time: 06/11/2023/4:44:40 PM  Final       Time coordinating discharge: over 30 minutes  SIGNED:  Sunnie Nielsen DO [facility]

## 2023-06-17 NOTE — TOC Progression Note (Addendum)
Transition of Care [facility]) - Progression Note    Patient Details  Name: Tina Peters MRN: [id number] Date of Birth: 03/11/1951  Transition of Care [facility]) CM/SW Contact  Truddie Hidden, RN Phone Number: 06/17/2023, 11:02 AM  Clinical Narrative:    Sherron Monday with Maralyn Sago from [facility]. Patient care has been accepted. Maralyn Sago advised patient will discharge home with her daughter.   Spoke with patient's daughter to advised care has been arranged with [facility]. Patient will discharge to [address] [place]. Patient daughter stated she had completed teaching with Pam from [employer].   TOC signing off            Expected Discharge Plan and Services                                               Social Determinants of Health (SDOH) Interventions SDOH Screenings   Food Insecurity: No Food Insecurity (06/10/2023)  Housing: Low Risk  (06/10/2023)  Transportation Needs: No Transportation Needs (06/10/2023)  Utilities: Not At Risk (06/10/2023)  Alcohol Screen: Low Risk  (01/27/2023)  Depression (PHQ2-9): Medium Risk (01/27/2023)  Financial Resource Strain: Medium Risk (01/27/2023)  Physical Activity: Insufficiently Active (01/27/2023)  Social Connections: Socially Isolated (06/10/2023)  Stress: Stress Concern Present (01/27/2023)  Tobacco Use: Medium Risk (06/09/2023)  Health Literacy: Adequate Health Literacy (01/27/2023)    Readmission Risk Interventions     No data to display

## 2023-06-17 NOTE — Telephone Encounter (Signed)
Cardiology post hours on call Coverage  Patient was just discharged from the hospital today after admission on 06/09/2023 for fatigue, malaise, transesophageal echo showed mitral valve endocarditis, was started on antibiotics and discharged home today.   Daughter reports since going home today patient has been very short of breath complaining of band in her chest unable to take deep breaths, oxygen level dropped to 70% via pulse ox check.  Sounds like patient is in heart failure, review of hospital records shows elevated BNP, chest x-ray shows pulmonary edema and I do not see any IV Lasix given to the patient. -Now she reports shortness of breath, hypoxia all points towards pulmonary edema   I have recommended them to come to hospital immediately, daughter is calling 911 and bring her to hospital for IV Lasix.

## 2023-06-17 NOTE — Progress Notes (Signed)
ANTICOAGULATION CONSULT NOTE  Pharmacy Consult for heparin infusion Indication: afib  No Known Allergies  Patient Measurements: Height: 5\' 5"  (165.1 cm) Weight: 102 kg (224 lb 13.9 oz) IBW/kg (Calculated) : 57 Heparin Dosing Weight: 80.5 kg  Vital Signs: Temp: 97.8 F (36.6 C) (02/10 2344) BP: 102/67 (02/11 0349) Pulse Rate: 94 (02/11 0349)  Labs: Recent Labs    06/15/23 0342 06/16/23 0404 06/17/23 0554  HGB 9.1* 8.9* 8.9*  HCT 29.0* 28.5* 29.2*  PLT 200 201 198  HEPARINUNFRC 0.59 0.66 0.59  CREATININE 2.19*  --   --     Estimated Creatinine Clearance: 27.5 mL/min (A) (by C-G formula based on SCr of 2.19 mg/dL (H)).   Medical History: Past Medical History:  Diagnosis Date   Allergy    Anxiety    Arthritis    Asthma    Chronic back pain    Chronic combined systolic (congestive) and diastolic (congestive) heart failure (HCC)    a. 02/2020 Echo: EF 60-65%, no rwma. Gr2 DD. Nl RV size/fxn. Mild LAE. Mild MR; b. 10/2021 Echo: [facility] following MVR) EF 40 to 45%.   Depression    Diverticulitis    Endocarditis    a. 10/2021 S sanguinous bacteremia w/ MV veg-->27mm Mitris bioprosthetic valve, Maze, and LAA clip.   Hyperlipidemia    Hypertension    Lipoma    Mild mitral regurgitation    a. 08/2016 Echo: mild MR; b. 02/2020 Echo: Mild MR.   Neuromuscular disorder (HCC)    Obesity    Obstructive sleep apnea    PAF (paroxysmal atrial fibrillation) (HCC)    a. Dx 05/2016--> Xarelto (CHA2DS2VASc = 3); b. 08/2016 Echo: EF 55-60%, no rwma, mild MR, mod dil LA; c. 03/2020 Zio: Avg HR 61 w/ Afib burden of 24% - max rate 189 (avg 93). 2 pauses - longest 4.7 secs post-conversion pauses (occurred @ 5:42 am); d. 04/2022 s/p DCCV; e. 08/2022 s/p DCCV.   Paroxysmal SVT (supraventricular tachycardia) (HCC)    S/P mitral valve replacement    a. 10/2021 [facility] - Endocarditis/MV Veg-->44mm Mitris bioprosthetic valve, Maze, and LAA clip.   Sleep apnea    Stroke [facility])    a. 02/2020 MRI/A:  multifocal acute ischemia in R MCA territory predominantly involving post insula and R middle frontal gyrus. Nl MRA.   Tachy-brady syndrome (HCC)    Tobacco abuse    Medications:  PTA Meds: Rivaroxaban 20 mg qpm, last dose unknown  Assessment: Pt is a 73 yo female presenting to ED c/o "diarrhea x6 days and vomiting. Reports feeling of heart fluttering and pain that started x2 days ago," found with elevated BNP and Troponin I level trending up. Pt has hx of afib and is on rivaroxaban. CHADSVASc 6. Now found to have endocarditis with hx of mitral bioprosthetic valve.   Goal of Therapy:  Heparin level 0.3-0.7 units/ml Monitor platelets by anticoagulation protocol: Yes  Monitoring: 2/4: aPTT @ 1242 was 39 Subtherapeutic 2/4 2253 aPTT 54, subtherapeutic 2/5 1123 aPTT 78 2/5 2008 aPTT 55  SUBtherapeutic 2/6 0723 aPTT 90 HL 0.36 2/7 0206 HL < 0.10 subtherapeutic (confirmed level with lab) 2/7 1236 HL 0.48, therapeutic at 2100 units/hour 2/7 2054 HL 0.4m therapeutic x 2 2/8 0341 HL 0.60, therapeutic x 3 2/9 0342 HL 0.59, therapeutic x 4 2/10 0404 HL 0.66, therapeutic x 5 2/11 0554 HL 0.59, therapeutic x 6   Plan: Heparin level remains therapeutic. Will continue heparin infusion at 2100 unit/hr. Recheck heparin level with AM  labs. CBC daily while on heparin.   Bettey Costa, PharmD Clinical Pharmacist 06/17/2023 6:48 AM

## 2023-06-17 NOTE — Progress Notes (Signed)
Rounding Note    Patient Name: Tina Peters Date of Encounter: 06/17/2023  [facility] Cardiologist: Lorine Bears, MD   Subjective   Patient is overall feeling OK. No chest pain or SOB. GI issues improving.   Inpatient Medications    Scheduled Meds:  amiodarone  400 mg Oral BID   aspirin EC  81 mg Oral Daily   Chlorhexidine Gluconate Cloth  6 each Topical Daily   metoprolol tartrate  50 mg Oral BID   sertraline  50 mg Oral Daily   sodium chloride flush  10-40 mL Intracatheter Q12H   Continuous Infusions:  cefTRIAXone (ROCEPHIN)  IV 2 g (06/16/23 1332)   heparin 2,100 Units/hr (06/16/23 2050)   PRN Meds: cyclobenzaprine, fentaNYL (SUBLIMAZE) injection, ondansetron **OR** ondansetron (ZOFRAN) IV, sodium chloride flush, traMADol   Vital Signs    Vitals:   06/16/23 2024 06/16/23 2344 06/17/23 0349 06/17/23 0738  BP: 102/63 127/89 102/67 127/65  Pulse: 98 93 94 93  Resp: 16 14  19   Temp: 98.7 F (37.1 C) 97.8 F (36.6 C)  98.6 F (37 C)  TempSrc:    Oral  SpO2: 97% 99% 96% 96%  Weight:      Height:        Intake/Output Summary (Last 24 hours) at 06/17/2023 0848 Last data filed at 06/17/2023 0415 Gross per 24 hour  Intake 10 ml  Output 1050 ml  Net -1040 ml      06/11/2023   12:05 PM 06/09/2023    9:35 PM 06/03/2023    2:17 PM  Last 3 Weights  Weight (lbs) 224 lb 13.9 oz 224 lb 13.9 oz 226 lb  Weight (kg) 102 kg 102 kg 102.513 kg      Telemetry    NSR> afib/flutter HR 90-110s on 2/5 - Personally Reviewed  ECG    No new - Personally Reviewed  Physical Exam   GEN: No acute distress.   Neck: No JVD Cardiac: Irreg IRreg, + murmurs, rubs, or gallops.  Respiratory: Clear to auscultation bilaterally. GI: Soft, nontender, non-distended  MS: No edema; No deformity. Neuro:  Nonfocal  Psych: Normal affect   Labs    High Sensitivity Troponin:   Recent Labs  Lab 06/01/23 1843 06/09/23 2153 06/10/23 0053 06/10/23 0731 06/10/23 1531   TROPONINIHS 45* 283* 331* 362* 228*     Chemistry Recent Labs  Lab 06/10/23 1531 06/11/23 0635 06/14/23 0341 06/14/23 0344 06/15/23 0342 06/17/23 0554  NA 137 135 137 135 134* 132*  K 3.7 3.6 3.8 3.8 4.1 4.2  CL 103 106 106 105 104 101  CO2 24 22 21* 21* 20* 21*  GLUCOSE 130* 99 111* 111* 104* 118*  BUN 15 13 21 22 22 21   CREATININE 1.12* 1.02* 2.42* 2.45* 2.19* 1.63*  CALCIUM 7.9* 7.8* 8.2* 8.0* 8.3* 8.1*  MG 2.3  --  2.1  --   --   --   PROT  --  5.7*  --   --   --   --   ALBUMIN  --  2.4*  --  2.6*  --   --   AST  --  27  --   --   --   --   ALT  --  19  --   --   --   --   ALKPHOS  --  77  --   --   --   --   BILITOT  --  1.0  --   --   --   --  GFRNONAA 52* 58* 21* 20* 23* 33*  ANIONGAP 10 7 10 9 10 10     Lipids No results for input(s): "CHOL", "TRIG", "HDL", "LABVLDL", "LDLCALC", "CHOLHDL" in the last 168 hours.  Hematology Recent Labs  Lab 06/15/23 0342 06/16/23 0404 06/17/23 0554  WBC 12.7* 10.9* 12.5*  RBC 3.38* 3.30* 3.38*  HGB 9.1* 8.9* 8.9*  HCT 29.0* 28.5* 29.2*  MCV 85.8 86.4 86.4  MCH 26.9 27.0 26.3  MCHC 31.4 31.2 30.5  RDW 17.6* 17.9* 17.9*  PLT 200 201 198   Thyroid  Recent Labs  Lab 06/10/23 1553 06/10/23 2253  TSH 0.890 1.423  FREET4 2.01*  --     BNPNo results for input(s): "BNP", "PROBNP" in the last 168 hours.  DDimer No results for input(s): "DDIMER" in the last 168 hours.   Radiology    DG Chest Port 1 View Result Date: 06/16/2023 CLINICAL DATA:  Status post PICC EXAM: PORTABLE CHEST 1 VIEW COMPARISON:  06/01/2023 x-ray.  CT 06/10/2023. FINDINGS: Status post median sternotomy. Atrial occlusion clip. Prosthetic valve. Enlarged cardiopericardial silhouette with vascular congestion. The right inferior costophrenic angle is clipped off the edge of the film. No clear pneumothorax or effusion. Overlapping cardiac leads. Right-sided PICC in place with tip at the SVC right atrial junction. Film is under penetrated IMPRESSION: New  right-sided PICC. Enlarged heart with pulmonary vascular congestion.  Postop changes. Electronically Signed   By: Karen Kays M.D.   On: 06/16/2023 20:17   [place] EKG SITE RITE Result Date: 06/16/2023 If Site Rite image not attached, placement could not be confirmed due to current cardiac rhythm.   Cardiac Studies   Echo TEE 06/11/2023  1. Left ventricular ejection fraction, by estimation, is 55 to 60%. The  left ventricle has normal function. The left ventricle has no regional  wall motion abnormalities.   2. Right ventricular systolic function is mildly reduced. The right  ventricular size is normal.   3. Left atrial size was severely dilated. No left atrial/left atrial  appendage thrombus was detected.   4. Right atrial size was mildly dilated.   5. The mitral valve has been repaired/replaced. No evidence of mitral  valve regurgitation. Mild mitral stenosis. The mean mitral valve gradient  is 7.0 mmHg. There is a bioprosthetic valve present in the mitral  position. Echo findings are consistent with   vegetation of the posterior mitral prosthesis, with significant  thickening and a small mobile opacity coming from the posterior leaflet.   6. Tricuspid valve regurgitation is moderate.   7. The aortic valve is normal in structure. There is mild calcification  of the aortic valve. Aortic valve regurgitation is not visualized. Aortic  valve sclerosis is present, with no evidence of aortic valve stenosis.   8. The inferior vena cava is normal in size with greater than 50%  respiratory variability, suggesting right atrial pressure of 3 mmHg.   9. Agitated saline contrast bubble study was negative, with no evidence  of any interatrial shunt.   Echo 06/10/23  1. Left ventricular ejection fraction, by estimation, is 50 to 55%. The  left ventricle has low normal function. The left ventricle has no regional  wall motion abnormalities. Left ventricular diastolic function could not  be evaluated.    2. Right ventricular systolic function is mildly reduced. The right  ventricular size is mildly enlarged.   3. Left atrial size was severely dilated.   4. Right atrial size was mildly dilated.   5. The mitral valve  has been repaired/replaced. No evidence of mitral  valve regurgitation. Moderate to severe mitral stenosis. The mean mitral  valve gradient is 11.0 mmHg.   6. Tricuspid valve regurgitation is mild to moderate.   7. The aortic valve is tricuspid. There is mild thickening of the aortic  valve. Aortic valve regurgitation is not visualized. Aortic valve  sclerosis/calcification is present, without any evidence of aortic  stenosis.   Echo 10/2021 Summary   1. The left ventricular systolic function is mildly to moderately decreased,  LVEF is visually estimated at 40-45%.    2. Mitral valve replacement ( 27 mm, bioprosthetic, implantation date:  10/11/2021).   3. Mitral valve Doppler indices are consistent with normal prosthetic valve  function.   4. The right ventricle is normal in size, with low normal systolic function.   Cardiac cath 10/2021  Coronary Findings:  Left Main:  - Ostial Left Main: No significant stenosis (large caliber)  - Left Main: No significant stenosis (large caliber)   Left Anterior Descending:  - Ostial LAD: No significant stenosis (large caliber)  - Proximal LAD: No significant stenosis (large caliber)  - Mid LAD: mild irregularity up to 25% (moderate to large caliber)  - Distal LAD: No significant stenosis (small to moderate caliber)  - First diagonal: No significant stenosis (small caliber)  - Second diagonal: No significant stenosis (moderate caliber)   Left Circumflex:  - Ostial LCx: No significant stenosis (large caliber)  - Proximal LCx: No significant stenosis (large caliber)  - Mid LCx: No significant stenosis (moderate caliber)  - Distal LCx: No significant stenosis (small to moderate caliber)  - OM1: No significant stenosis (large  caliber)  - OM2: No significant stenosis (small caliber)  - OM3: No significant stenosis (small caliber)  - OM4: No significant stenosis (small to moderate caliber)   Right coronary artery:  - Ostial RCA: No significant stenosis (large caliber)  - Proximal RCA: No significant stenosis (large caliber)  - Mid RCA: mild irregularity up to 25% (large caliber)  - Distal RCA: No significant stenosis (small to moderate caliber)  - PDA: No significant stenosis (small to moderate caliber)  - RCA continuation: No significant stenosis (small caliber)  - PL1: No significant stenosis  - PL2: No significant stenosis    Left Ventriculogram:  - Left ventriculogram was not performed.    Patient Profile     73 y.o. female with a h/o paroxysmal Afib, tachybradycardia syndrome, pSVT, HTN, HLD, stroke, OSA on  CPAP, CKD stage 3, endocarditis who is being seen for mitral valve endocarditis  Assessment & Plan    Bioprosthetic mitral valve endocarditis - CT surgery (Dr. Leafy Ro) recommended consevative management with antibiotics with follow-up TTE in 6 weeks. HE would reserve valve intervention for evidence of significant valve dysfunction and persistent bacteremia/sepsis - abx per ID/IM - we will arrange follow-up and TTE  Paroxysmal Afib/flutter - remains in afib/flutter - started on amiodarone 400mg  BID, we will change to amiodarone 200mg  BID x 1 week then 200mg  thereafter - stop IV heparin and start Eliquis 5mg  BID. Monitor Hgb - continue Lopressor 50mg  BID - we will have patient follow-up with EP in 1 week  Elevated troponin - suspect supply/demand mismatch  - [facility] 10/2021 showed mild CAD - no plan for cardiac cath unless the patient has persistent angina due to AKI and ongoing infectious process   For questions or updates, please contact [facility] Please consult [email or web address] for contact info under  Signed, Keyion Knack David Stall, PA-C  06/17/2023, 8:48 AM

## 2023-06-17 NOTE — Telephone Encounter (Signed)
Patient Product/process development scientist completed.    The patient is insured through [employer]. Patient has Medicare and is not eligible for a copay card, but may be able to apply for patient assistance or Medicare RX Payment Plan (Patient Must reach out to their plan, if eligible for payment plan), if available.    Ran test claim for Eliquis 5 mg and the current 30 day co-pay is $0.00.   This test claim was processed through [facility]- copay amounts may vary at other pharmacies due to pharmacy/plan contracts, or as the patient moves through the different stages of their insurance plan.     Roland Earl, CPHT Pharmacy Technician III Certified Patient Advocate [facility] Patient Advocate Team Direct Number: [phone]  Fax: [phone]

## 2023-06-18 DIAGNOSIS — T826XXA Infection and inflammatory reaction due to cardiac valve prosthesis, initial encounter: Secondary | ICD-10-CM | POA: Diagnosis not present

## 2023-06-18 LAB — CULTURE, BLOOD (ROUTINE X 2)
Culture: NO GROWTH
Special Requests: ADEQUATE

## 2023-06-19 ENCOUNTER — Telehealth: Payer: Self-pay

## 2023-06-19 NOTE — Transitions of Care (Post Inpatient/ED Visit) (Signed)
   06/19/2023  Name: Tina Peters MRN: [id number] DOB: [birth year]  Today's TOC FU Call Status:    Attempted to reach the patient regarding the most recent Inpatient/ED visit.  Follow Up Plan: Additional outreach attempts will be made to reach the patient to complete the Transitions of Care (Post Inpatient/ED visit) call.   Deidre Ala, BSN, RN [place]  VBCI - [employer] [facility] RN Care Manager [phone]

## 2023-06-20 ENCOUNTER — Emergency Department: Payer: Medicare HMO

## 2023-06-20 ENCOUNTER — Inpatient Hospital Stay
Admission: EM | Admit: 2023-06-20 | Discharge: 2023-06-27 | DRG: 291 | Disposition: A | Discharge status: Home Health | Payer: Medicare HMO | Attending: Osteopathic Medicine | Admitting: Osteopathic Medicine

## 2023-06-20 ENCOUNTER — Other Ambulatory Visit: Payer: Self-pay

## 2023-06-20 DIAGNOSIS — I2510 Atherosclerotic heart disease of native coronary artery without angina pectoris: Secondary | ICD-10-CM | POA: Diagnosis not present

## 2023-06-20 DIAGNOSIS — N1832 Chronic kidney disease, stage 3b: Secondary | ICD-10-CM

## 2023-06-20 DIAGNOSIS — Y831 Surgical operation with implant of artificial internal device as the cause of abnormal reaction of the patient, or of later complication, without mention of misadventure at the time of the procedure: Secondary | ICD-10-CM | POA: Diagnosis present

## 2023-06-20 DIAGNOSIS — E66813 Obesity, class 3: Secondary | ICD-10-CM | POA: Insufficient documentation

## 2023-06-20 DIAGNOSIS — I483 Typical atrial flutter: Secondary | ICD-10-CM | POA: Diagnosis present

## 2023-06-20 DIAGNOSIS — I2721 Secondary pulmonary arterial hypertension: Secondary | ICD-10-CM | POA: Diagnosis present

## 2023-06-20 DIAGNOSIS — N183 Chronic kidney disease, stage 3 unspecified: Secondary | ICD-10-CM | POA: Diagnosis not present

## 2023-06-20 DIAGNOSIS — I5A Non-ischemic myocardial injury (non-traumatic): Secondary | ICD-10-CM | POA: Diagnosis not present

## 2023-06-20 DIAGNOSIS — Z6841 Body Mass Index (BMI) 40.0 and over, adult: Secondary | ICD-10-CM

## 2023-06-20 DIAGNOSIS — N189 Chronic kidney disease, unspecified: Secondary | ICD-10-CM | POA: Diagnosis present

## 2023-06-20 DIAGNOSIS — I130 Hypertensive heart and chronic kidney disease with heart failure and stage 1 through stage 4 chronic kidney disease, or unspecified chronic kidney disease: Secondary | ICD-10-CM | POA: Diagnosis not present

## 2023-06-20 DIAGNOSIS — I509 Heart failure, unspecified: Secondary | ICD-10-CM | POA: Diagnosis not present

## 2023-06-20 DIAGNOSIS — I482 Chronic atrial fibrillation, unspecified: Secondary | ICD-10-CM | POA: Diagnosis not present

## 2023-06-20 DIAGNOSIS — M5416 Radiculopathy, lumbar region: Secondary | ICD-10-CM | POA: Diagnosis not present

## 2023-06-20 DIAGNOSIS — R5383 Other fatigue: Secondary | ICD-10-CM | POA: Diagnosis not present

## 2023-06-20 DIAGNOSIS — Z9889 Other specified postprocedural states: Secondary | ICD-10-CM

## 2023-06-20 DIAGNOSIS — Z823 Family history of stroke: Secondary | ICD-10-CM

## 2023-06-20 DIAGNOSIS — D631 Anemia in chronic kidney disease: Secondary | ICD-10-CM | POA: Diagnosis not present

## 2023-06-20 DIAGNOSIS — Z5986 Financial insecurity: Secondary | ICD-10-CM

## 2023-06-20 DIAGNOSIS — Z9181 History of falling: Secondary | ICD-10-CM | POA: Diagnosis not present

## 2023-06-20 DIAGNOSIS — E871 Hypo-osmolality and hyponatremia: Secondary | ICD-10-CM | POA: Insufficient documentation

## 2023-06-20 DIAGNOSIS — D649 Anemia, unspecified: Secondary | ICD-10-CM | POA: Insufficient documentation

## 2023-06-20 DIAGNOSIS — Z79899 Other long term (current) drug therapy: Secondary | ICD-10-CM

## 2023-06-20 DIAGNOSIS — R7881 Bacteremia: Secondary | ICD-10-CM | POA: Diagnosis not present

## 2023-06-20 DIAGNOSIS — R109 Unspecified abdominal pain: Secondary | ICD-10-CM | POA: Diagnosis not present

## 2023-06-20 DIAGNOSIS — R1010 Upper abdominal pain, unspecified: Secondary | ICD-10-CM | POA: Diagnosis not present

## 2023-06-20 DIAGNOSIS — N1831 Chronic kidney disease, stage 3a: Secondary | ICD-10-CM | POA: Diagnosis present

## 2023-06-20 DIAGNOSIS — Z833 Family history of diabetes mellitus: Secondary | ICD-10-CM

## 2023-06-20 DIAGNOSIS — M810 Age-related osteoporosis without current pathological fracture: Secondary | ICD-10-CM | POA: Diagnosis not present

## 2023-06-20 DIAGNOSIS — Z792 Long term (current) use of antibiotics: Secondary | ICD-10-CM | POA: Diagnosis not present

## 2023-06-20 DIAGNOSIS — F418 Other specified anxiety disorders: Secondary | ICD-10-CM | POA: Diagnosis not present

## 2023-06-20 DIAGNOSIS — Z87891 Personal history of nicotine dependence: Secondary | ICD-10-CM | POA: Diagnosis not present

## 2023-06-20 DIAGNOSIS — B9689 Other specified bacterial agents as the cause of diseases classified elsewhere: Secondary | ICD-10-CM | POA: Diagnosis not present

## 2023-06-20 DIAGNOSIS — E872 Acidosis, unspecified: Secondary | ICD-10-CM | POA: Diagnosis present

## 2023-06-20 DIAGNOSIS — I5033 Acute on chronic diastolic (congestive) heart failure: Secondary | ICD-10-CM | POA: Diagnosis present

## 2023-06-20 DIAGNOSIS — I110 Hypertensive heart disease with heart failure: Secondary | ICD-10-CM | POA: Diagnosis not present

## 2023-06-20 DIAGNOSIS — Z1152 Encounter for screening for COVID-19: Secondary | ICD-10-CM

## 2023-06-20 DIAGNOSIS — R918 Other nonspecific abnormal finding of lung field: Secondary | ICD-10-CM | POA: Diagnosis not present

## 2023-06-20 DIAGNOSIS — K8020 Calculus of gallbladder without cholecystitis without obstruction: Secondary | ICD-10-CM | POA: Diagnosis present

## 2023-06-20 DIAGNOSIS — E785 Hyperlipidemia, unspecified: Secondary | ICD-10-CM | POA: Diagnosis present

## 2023-06-20 DIAGNOSIS — Z8673 Personal history of transient ischemic attack (TIA), and cerebral infarction without residual deficits: Secondary | ICD-10-CM | POA: Diagnosis not present

## 2023-06-20 DIAGNOSIS — R590 Localized enlarged lymph nodes: Secondary | ICD-10-CM | POA: Diagnosis not present

## 2023-06-20 DIAGNOSIS — I639 Cerebral infarction, unspecified: Secondary | ICD-10-CM | POA: Insufficient documentation

## 2023-06-20 DIAGNOSIS — T826XXA Infection and inflammatory reaction due to cardiac valve prosthesis, initial encounter: Secondary | ICD-10-CM | POA: Diagnosis present

## 2023-06-20 DIAGNOSIS — M1990 Unspecified osteoarthritis, unspecified site: Secondary | ICD-10-CM | POA: Diagnosis present

## 2023-06-20 DIAGNOSIS — D509 Iron deficiency anemia, unspecified: Secondary | ICD-10-CM | POA: Insufficient documentation

## 2023-06-20 DIAGNOSIS — D696 Thrombocytopenia, unspecified: Secondary | ICD-10-CM | POA: Diagnosis not present

## 2023-06-20 DIAGNOSIS — M1000 Idiopathic gout, unspecified site: Secondary | ICD-10-CM | POA: Diagnosis not present

## 2023-06-20 DIAGNOSIS — I340 Nonrheumatic mitral (valve) insufficiency: Secondary | ICD-10-CM | POA: Diagnosis not present

## 2023-06-20 DIAGNOSIS — G4733 Obstructive sleep apnea (adult) (pediatric): Secondary | ICD-10-CM | POA: Diagnosis not present

## 2023-06-20 DIAGNOSIS — R7989 Other specified abnormal findings of blood chemistry: Secondary | ICD-10-CM | POA: Diagnosis present

## 2023-06-20 DIAGNOSIS — I330 Acute and subacute infective endocarditis: Secondary | ICD-10-CM | POA: Diagnosis not present

## 2023-06-20 DIAGNOSIS — M109 Gout, unspecified: Secondary | ICD-10-CM

## 2023-06-20 DIAGNOSIS — Z8249 Family history of ischemic heart disease and other diseases of the circulatory system: Secondary | ICD-10-CM

## 2023-06-20 DIAGNOSIS — I4892 Unspecified atrial flutter: Secondary | ICD-10-CM | POA: Diagnosis not present

## 2023-06-20 DIAGNOSIS — N179 Acute kidney failure, unspecified: Secondary | ICD-10-CM | POA: Diagnosis present

## 2023-06-20 DIAGNOSIS — Z7982 Long term (current) use of aspirin: Secondary | ICD-10-CM

## 2023-06-20 DIAGNOSIS — K5730 Diverticulosis of large intestine without perforation or abscess without bleeding: Secondary | ICD-10-CM | POA: Diagnosis present

## 2023-06-20 DIAGNOSIS — R6889 Other general symptoms and signs: Secondary | ICD-10-CM | POA: Diagnosis not present

## 2023-06-20 DIAGNOSIS — R0602 Shortness of breath: Secondary | ICD-10-CM

## 2023-06-20 DIAGNOSIS — Z952 Presence of prosthetic heart valve: Secondary | ICD-10-CM | POA: Diagnosis not present

## 2023-06-20 DIAGNOSIS — M10071 Idiopathic gout, right ankle and foot: Secondary | ICD-10-CM | POA: Diagnosis not present

## 2023-06-20 DIAGNOSIS — I2489 Other forms of acute ischemic heart disease: Secondary | ICD-10-CM | POA: Diagnosis present

## 2023-06-20 DIAGNOSIS — D72829 Elevated white blood cell count, unspecified: Principal | ICD-10-CM

## 2023-06-20 DIAGNOSIS — K5790 Diverticulosis of intestine, part unspecified, without perforation or abscess without bleeding: Secondary | ICD-10-CM

## 2023-06-20 DIAGNOSIS — I480 Paroxysmal atrial fibrillation: Secondary | ICD-10-CM | POA: Diagnosis present

## 2023-06-20 DIAGNOSIS — M25571 Pain in right ankle and joints of right foot: Secondary | ICD-10-CM | POA: Diagnosis present

## 2023-06-20 DIAGNOSIS — I50813 Acute on chronic right heart failure: Secondary | ICD-10-CM | POA: Diagnosis not present

## 2023-06-20 DIAGNOSIS — F32A Depression, unspecified: Secondary | ICD-10-CM | POA: Diagnosis not present

## 2023-06-20 DIAGNOSIS — G8929 Other chronic pain: Secondary | ICD-10-CM | POA: Diagnosis present

## 2023-06-20 DIAGNOSIS — I058 Other rheumatic mitral valve diseases: Secondary | ICD-10-CM | POA: Diagnosis not present

## 2023-06-20 DIAGNOSIS — J90 Pleural effusion, not elsewhere classified: Secondary | ICD-10-CM | POA: Diagnosis not present

## 2023-06-20 DIAGNOSIS — I10 Essential (primary) hypertension: Secondary | ICD-10-CM | POA: Diagnosis present

## 2023-06-20 DIAGNOSIS — I059 Rheumatic mitral valve disease, unspecified: Secondary | ICD-10-CM | POA: Diagnosis not present

## 2023-06-20 DIAGNOSIS — J9811 Atelectasis: Secondary | ICD-10-CM | POA: Diagnosis not present

## 2023-06-20 DIAGNOSIS — K5900 Constipation, unspecified: Secondary | ICD-10-CM | POA: Diagnosis present

## 2023-06-20 DIAGNOSIS — E66812 Obesity, class 2: Secondary | ICD-10-CM | POA: Diagnosis present

## 2023-06-20 DIAGNOSIS — Z7901 Long term (current) use of anticoagulants: Secondary | ICD-10-CM

## 2023-06-20 DIAGNOSIS — A419 Sepsis, unspecified organism: Secondary | ICD-10-CM | POA: Diagnosis not present

## 2023-06-20 DIAGNOSIS — I272 Pulmonary hypertension, unspecified: Secondary | ICD-10-CM | POA: Diagnosis not present

## 2023-06-20 DIAGNOSIS — Z452 Encounter for adjustment and management of vascular access device: Secondary | ICD-10-CM | POA: Diagnosis not present

## 2023-06-20 DIAGNOSIS — T502X5A Adverse effect of carbonic-anhydrase inhibitors, benzothiadiazides and other diuretics, initial encounter: Secondary | ICD-10-CM | POA: Diagnosis present

## 2023-06-20 DIAGNOSIS — R0989 Other specified symptoms and signs involving the circulatory and respiratory systems: Secondary | ICD-10-CM | POA: Diagnosis not present

## 2023-06-20 DIAGNOSIS — Z556 Problems related to health literacy: Secondary | ICD-10-CM | POA: Diagnosis not present

## 2023-06-20 DIAGNOSIS — Z7984 Long term (current) use of oral hypoglycemic drugs: Secondary | ICD-10-CM

## 2023-06-20 DIAGNOSIS — I38 Endocarditis, valve unspecified: Secondary | ICD-10-CM | POA: Diagnosis not present

## 2023-06-20 LAB — CBC
HCT: 31 % — ABNORMAL LOW (ref 36.0–46.0)
Hemoglobin: 9.2 g/dL — ABNORMAL LOW (ref 12.0–15.0)
MCH: 26.7 pg (ref 26.0–34.0)
MCHC: 29.7 g/dL — ABNORMAL LOW (ref 30.0–36.0)
MCV: 89.9 fL (ref 80.0–100.0)
Platelets: 186 10*3/uL (ref 150–400)
RBC: 3.45 MIL/uL — ABNORMAL LOW (ref 3.87–5.11)
RDW: 18.1 % — ABNORMAL HIGH (ref 11.5–15.5)
WBC: 20.7 10*3/uL — ABNORMAL HIGH (ref 4.0–10.5)
nRBC: 1.3 % — ABNORMAL HIGH (ref 0.0–0.2)

## 2023-06-20 LAB — URINALYSIS, ROUTINE W REFLEX MICROSCOPIC
Bilirubin Urine: NEGATIVE
Glucose, UA: NEGATIVE mg/dL
Hgb urine dipstick: NEGATIVE
Ketones, ur: NEGATIVE mg/dL
Leukocytes,Ua: NEGATIVE
Nitrite: NEGATIVE
Protein, ur: NEGATIVE mg/dL
Specific Gravity, Urine: 1.01 (ref 1.005–1.030)
pH: 5 (ref 5.0–8.0)

## 2023-06-20 LAB — COMPREHENSIVE METABOLIC PANEL
ALT: 43 U/L (ref 0–44)
AST: 102 U/L — ABNORMAL HIGH (ref 15–41)
Albumin: 2.9 g/dL — ABNORMAL LOW (ref 3.5–5.0)
Alkaline Phosphatase: 221 U/L — ABNORMAL HIGH (ref 38–126)
Anion gap: 13 (ref 5–15)
BUN: 26 mg/dL — ABNORMAL HIGH (ref 8–23)
CO2: 17 mmol/L — ABNORMAL LOW (ref 22–32)
Calcium: 8.6 mg/dL — ABNORMAL LOW (ref 8.9–10.3)
Chloride: 105 mmol/L (ref 98–111)
Creatinine, Ser: 1.53 mg/dL — ABNORMAL HIGH (ref 0.44–1.00)
GFR, Estimated: 36 mL/min — ABNORMAL LOW (ref >60)
Glucose, Bld: 117 mg/dL — ABNORMAL HIGH (ref 70–99)
Potassium: 5.1 mmol/L (ref 3.5–5.1)
Sodium: 135 mmol/L (ref 135–145)
Total Bilirubin: 2.1 mg/dL — ABNORMAL HIGH (ref 0.0–1.2)
Total Protein: 7.2 g/dL (ref 6.5–8.1)

## 2023-06-20 LAB — LIPASE, BLOOD: Lipase: 28 U/L (ref 11–51)

## 2023-06-20 LAB — RESP PANEL BY RT-PCR (RSV, FLU A&B, COVID)  RVPGX2
Influenza A by PCR: NEGATIVE
Influenza B by PCR: NEGATIVE
Resp Syncytial Virus by PCR: NEGATIVE
SARS Coronavirus 2 by RT PCR: NEGATIVE

## 2023-06-20 LAB — LACTIC ACID, PLASMA
Lactic Acid, Venous: 4.8 mmol/L (ref 0.5–1.9)
Lactic Acid, Venous: 3.8 mmol/L (ref 0.5–1.9)

## 2023-06-20 LAB — SEDIMENTATION RATE: Sed Rate: 5 mm/h (ref 0–30)

## 2023-06-20 LAB — TROPONIN I (HIGH SENSITIVITY)
Troponin I (High Sensitivity): 105 ng/L (ref <18)
Troponin I (High Sensitivity): 107 ng/L (ref <18)

## 2023-06-20 LAB — BRAIN NATRIURETIC PEPTIDE: B Natriuretic Peptide: 1353.9 pg/mL — ABNORMAL HIGH (ref 0.0–100.0)

## 2023-06-20 LAB — MAGNESIUM: Magnesium: 1.8 mg/dL (ref 1.7–2.4)

## 2023-06-20 MED ORDER — VANCOMYCIN HCL IN DEXTROSE 1-5 GM/200ML-% IV SOLN
1000.0000 mg | INTRAVENOUS | Status: DC
  Administered 2023-06-21: 1000 mg via INTRAVENOUS
  Filled 2023-06-20: qty 200

## 2023-06-20 MED ORDER — CHLORHEXIDINE GLUCONATE CLOTH 2 % EX PADS
6.0000 | MEDICATED_PAD | Freq: Every day | CUTANEOUS | Status: DC
Start: 2023-06-21 — End: 2023-06-27
  Administered 2023-06-22 – 2023-06-27 (×6): 6 via TOPICAL
  Filled 2023-06-20: qty 6

## 2023-06-20 MED ORDER — SODIUM CHLORIDE 0.9 % IV SOLN
2.0000 g | Freq: Once | INTRAVENOUS | Status: AC
  Administered 2023-06-20: 2 g via INTRAVENOUS
  Filled 2023-06-20: qty 12.5

## 2023-06-20 MED ORDER — ALBUTEROL SULFATE (2.5 MG/3ML) 0.083% IN NEBU: 2.5000 mg | INHALATION_SOLUTION | RESPIRATORY_TRACT | Status: DC | PRN

## 2023-06-20 MED ORDER — ACETAMINOPHEN 325 MG PO TABS: 650.0000 mg | ORAL_TABLET | Freq: Four times a day (QID) | ORAL | Status: DC | PRN

## 2023-06-20 MED ORDER — IBUPROFEN 400 MG PO TABS: 200.0000 mg | ORAL_TABLET | Freq: Four times a day (QID) | ORAL | Status: DC | PRN

## 2023-06-20 MED ORDER — SODIUM CHLORIDE 0.9 % IV SOLN
2.0000 g | Freq: Two times a day (BID) | INTRAVENOUS | Status: DC
  Administered 2023-06-21 – 2023-06-23 (×5): 2 g via INTRAVENOUS
  Filled 2023-06-20 (×6): qty 12.5

## 2023-06-20 MED ORDER — HYDRALAZINE HCL 20 MG/ML IJ SOLN: 5.0000 mg | INTRAMUSCULAR | Status: DC | PRN

## 2023-06-20 MED ORDER — IOHEXOL 350 MG/ML SOLN
75.0000 mL | Freq: Once | INTRAVENOUS | Status: AC | PRN
  Administered 2023-06-20: 60 mL via INTRAVENOUS

## 2023-06-20 MED ORDER — FUROSEMIDE 10 MG/ML IJ SOLN
40.0000 mg | Freq: Once | INTRAMUSCULAR | Status: AC
  Administered 2023-06-20: 40 mg via INTRAVENOUS
  Filled 2023-06-20: qty 4

## 2023-06-20 MED ORDER — VANCOMYCIN HCL IN DEXTROSE 1-5 GM/200ML-% IV SOLN
1000.0000 mg | Freq: Once | INTRAVENOUS | Status: AC
  Administered 2023-06-20: 1000 mg via INTRAVENOUS
  Filled 2023-06-20: qty 200

## 2023-06-20 MED ORDER — METRONIDAZOLE 500 MG/100ML IV SOLN
500.0000 mg | Freq: Once | INTRAVENOUS | Status: AC
  Administered 2023-06-20: 500 mg via INTRAVENOUS
  Filled 2023-06-20: qty 100

## 2023-06-20 MED ORDER — DIPHENHYDRAMINE HCL 50 MG/ML IJ SOLN
12.5000 mg | Freq: Three times a day (TID) | INTRAMUSCULAR | Status: DC | PRN
  Administered 2023-06-21: 12.5 mg via INTRAVENOUS
  Filled 2023-06-20: qty 1

## 2023-06-20 MED ORDER — HEPARIN SODIUM (PORCINE) 5000 UNIT/ML IJ SOLN
5000.0000 [IU] | Freq: Three times a day (TID) | INTRAMUSCULAR | Status: DC
  Administered 2023-06-20: 5000 [IU] via SUBCUTANEOUS
  Filled 2023-06-20: qty 1

## 2023-06-20 MED ORDER — METHOCARBAMOL 500 MG PO TABS
500.0000 mg | ORAL_TABLET | Freq: Three times a day (TID) | ORAL | Status: DC | PRN
  Administered 2023-06-20 – 2023-06-22 (×2): 500 mg via ORAL
  Filled 2023-06-20 (×2): qty 1

## 2023-06-20 MED ORDER — DM-GUAIFENESIN ER 30-600 MG PO TB12
1.0000 | ORAL_TABLET | Freq: Two times a day (BID) | ORAL | Status: DC | PRN
  Administered 2023-06-20: 1 via ORAL
  Filled 2023-06-20 (×2): qty 1

## 2023-06-20 MED ORDER — MORPHINE SULFATE (PF) 2 MG/ML IV SOLN
1.0000 mg | INTRAVENOUS | Status: DC | PRN
  Administered 2023-06-20 – 2023-06-21 (×3): 1 mg via INTRAVENOUS
  Filled 2023-06-20 (×3): qty 1

## 2023-06-20 MED ORDER — VANCOMYCIN HCL 1250 MG/250ML IV SOLN
1250.0000 mg | Freq: Once | INTRAVENOUS | Status: AC
  Administered 2023-06-20: 1250 mg via INTRAVENOUS
  Filled 2023-06-20: qty 250

## 2023-06-20 MED ORDER — SODIUM CHLORIDE 0.9% FLUSH
10.0000 mL | Freq: Two times a day (BID) | INTRAVENOUS | Status: DC
  Administered 2023-06-20 – 2023-06-26 (×13): 10 mL
  Administered 2023-06-27: 20 mL

## 2023-06-20 MED ORDER — SODIUM CHLORIDE 0.9% FLUSH
10.0000 mL | INTRAVENOUS | Status: DC | PRN
Start: 2023-06-20 — End: 2023-06-27

## 2023-06-20 MED ORDER — OXYCODONE HCL 5 MG PO TABS
5.0000 mg | ORAL_TABLET | Freq: Four times a day (QID) | ORAL | Status: DC | PRN
  Filled 2023-06-20: qty 1

## 2023-06-20 NOTE — ED Triage Notes (Signed)
Arrives from home via [facility]>  C?O SOB since this morning.  Normally on CPAP to sleep.  Does not wear home o2.  99% RA.  Central line placed on Monday, receiving ANTIBX for mitral valve infection.    132/86 88 99% RA 12-lead wnl  Hx Afib, takes a blood thinner.

## 2023-06-20 NOTE — ED Triage Notes (Addendum)
Pt c/o SHOB, middle back and diffuse abdominal pain, lack of appetite. See also triage note. Pt is AOX4, NAD noted.

## 2023-06-20 NOTE — ED Provider Notes (Signed)
[facility] Provider Note    Event Date/Time   First MD Initiated Contact with Patient 06/20/23 1617     (approximate)   History   SOB   HPI  Tina Peters is a 73 y.o. female   who presents to the emergency department today because of concerns for shortness of breath and weakness.  The patient did have discharge from the hospital 3 days ago after admission for endocarditis.  Since discharge the patient has not felt particularly well.  She has been having increasing shortness of breath and weakness.  No measured fevers although she has had chills.  Patient has been taking her antibiotics through the PICC line.      Physical Exam   Triage Vital Signs: ED Triage Vitals  Encounter Vitals Group     BP 06/20/23 1400 126/76     Systolic BP Percentile --      Diastolic BP Percentile --      Pulse Rate 06/20/23 1357 87     Resp 06/20/23 1357 17     Temp 06/20/23 1357 97.8 F (36.6 C)     Temp Source 06/20/23 1357 Oral     SpO2 06/20/23 1357 98 %     Weight --      Height --      Head Circumference --      Peak Flow --      Pain Score 06/20/23 1358 7     Pain Loc --      Pain Education --      Exclude from Growth Chart --     Most recent vital signs: Vitals:   06/20/23 1357 06/20/23 1400  BP:  126/76  Pulse: 87   Resp: 17   Temp: 97.8 F (36.6 C)   SpO2: 98%    General: Awake, alert, oriented. CV:  Good peripheral perfusion. Regular rate and rhythm. Resp:  Normal effort. Lungs clear. Abd:  No distention.    ED Results / Procedures / Treatments   Labs (all labs ordered are listed, but only abnormal results are displayed) Labs Reviewed  COMPREHENSIVE METABOLIC PANEL - Abnormal; Notable for the following components:      Result Value   CO2 17 (*)    Glucose, Bld 117 (*)    BUN 26 (*)    Creatinine, Ser 1.53 (*)    Calcium 8.6 (*)    Albumin 2.9 (*)    AST 102 (*)    Alkaline Phosphatase 221 (*)    Total Bilirubin 2.1 (*)     GFR, Estimated 36 (*)    All other components within normal limits  CBC - Abnormal; Notable for the following components:   WBC 20.7 (*)    RBC 3.45 (*)    Hemoglobin 9.2 (*)    HCT 31.0 (*)    MCHC 29.7 (*)    RDW 18.1 (*)    nRBC 1.3 (*)    All other components within normal limits  BRAIN NATRIURETIC PEPTIDE - Abnormal; Notable for the following components:   B Natriuretic Peptide 1,353.9 (*)    All other components within normal limits  LACTIC ACID, PLASMA - Abnormal; Notable for the following components:   Lactic Acid, Venous 4.8 (*)    All other components within normal limits  TROPONIN I (HIGH SENSITIVITY) - Abnormal; Notable for the following components:   Troponin I (High Sensitivity) 105 (*)    All other components within normal limits  TROPONIN I (  HIGH SENSITIVITY) - Abnormal; Notable for the following components:   Troponin I (High Sensitivity) 107 (*)    All other components within normal limits  RESP PANEL BY RT-PCR (RSV, FLU A&B, COVID)  RVPGX2  CULTURE, BLOOD (ROUTINE X 2)  CULTURE, BLOOD (ROUTINE X 2)  LIPASE, BLOOD  URINALYSIS, ROUTINE W REFLEX MICROSCOPIC  LACTIC ACID, PLASMA  MAGNESIUM  C-REACTIVE PROTEIN  SEDIMENTATION RATE     EKG  I, Phineas Semen, attending physician, personally viewed and interpreted this EKG  EKG Time: 1401 Rate: 85 Rhythm: atrial fibrillation Axis: right axis deviation Intervals: qtc 502 QRS: LAFB ST changes: no st elevation Impression: abnormal ekg   RADIOLOGY I independently interpreted and visualized the CXR. My interpretation: cardiomegaly, bilateral pulmonary effusions  Radiology interpretation:  IMPRESSION:  *Mild central pulmonary vascular congestion. No frank pulmonary  edema.  *Bilateral pleural effusions and atelectasis with or without  consolidation. Correlate clinically.    I independently interpreted and visualized the CT angio PE. My interpretation: No large PE. Bilateral pulmonary  effusions Radiology interpretation:  IMPRESSION:  1. No evidence of pulmonary embolism to the lobar branch level.  2. Small-moderate right and small left pleural effusions with  compressive atelectasis.  3. Dilated pulmonary trunk, which can be seen in the setting of  pulmonary arterial hypertension.  4. Reflux of contrast into the IVC and hepatic veins, suggestive of  right heart dysfunction.  5. Slight interval increase in size of several mildly enlarged  mediastinal lymph nodes, likely reactive.  6. Aortic and coronary artery atherosclerosis (ICD10-I70.0).      PROCEDURES:  Critical Care performed: Yes  CRITICAL CARE Performed by: Phineas Semen   Total critical care time: 35 minutes  Critical care time was exclusive of separately billable procedures and treating other patients.  Critical care was necessary to treat or prevent imminent or life-threatening deterioration.  Critical care was time spent personally by me on the following activities: development of treatment plan with patient and/or surrogate as well as nursing, discussions with consultants, evaluation of patient's response to treatment, examination of patient, obtaining history from patient or surrogate, ordering and performing treatments and interventions, ordering and review of laboratory studies, ordering and review of radiographic studies, pulse oximetry and re-evaluation of patient's condition.   Procedures    MEDICATIONS ORDERED IN ED: Medications  iohexol (OMNIPAQUE) 350 MG/ML injection 75 mL (60 mLs Intravenous Contrast Given 06/20/23 1552)     IMPRESSION / MDM / ASSESSMENT AND PLAN / ED COURSE  I reviewed the triage vital signs and the nursing notes.                              Differential diagnosis includes, but is not limited to, infection, anemia, electrolyte abnormality, PE  Patient's presentation is most consistent with acute presentation with potential threat to life or bodily  function.   The patient is on the cardiac monitor to evaluate for evidence of arrhythmia and/or significant heart rate changes.  Patient presented to the emergency department today because of concerns for weakness and worsening shortness of breath.  On exam patient is awake and alert.  She does have some lower extremity edema.  Workup here is concerning for new pleural effusions.  Per my reads they were not present on the CT done prior to previous admission.  Do have concern for CHF.  Additionally patient's white count is elevated.  This is worse than her white count at  time of discharge.  Do have concerns for infection.  Patient was given broad-spectrum IV antibiotics.  At this time I do think patient would benefit from further workup and admission.  Discussed with Dr. Clyde Lundborg with the hospitalist service who will evaluate for admission.      FINAL CLINICAL IMPRESSION(S) / ED DIAGNOSES   Final diagnoses:  Leukocytosis, unspecified type  Pleural effusion     Note:  This document was prepared using Dragon voice recognition software and may include unintentional dictation errors.    Phineas Semen, MD 06/20/23 [phone]

## 2023-06-20 NOTE — ED Provider Triage Note (Signed)
Emergency Medicine Provider Triage Evaluation Note  Tina Peters , a 73 y.o. female  was evaluated in triage.  Pt complains of 2 weeks of shortness of breath chest pain abdominal pain generalized fatigue.  Recently discharged for endocarditis still getting home antibiotics..  Review of Systems  Positive:  Negative:   Physical Exam  BP 126/76 (BP Location: Left Arm)   Pulse 87   Temp 97.8 F (36.6 C) (Oral)   Resp 17   SpO2 98%  Gen:   Awake, no distress   Resp:  Normal effort  MSK:   Moves extremities without difficulty  Other:    Medical Decision Making  Medically screening exam initiated at 2:07 PM.  Appropriate orders placed.  [name] was informed that the remainder of the evaluation will be completed by another provider, this initial triage assessment does not replace that evaluation, and the importance of remaining in the ED until their evaluation is complete.  Cardiac and abdominal workup recent endocarditis.  Overall well-appearing on my exam   Christen Bame, Cordelia Poche 06/20/23 1409

## 2023-06-20 NOTE — Telephone Encounter (Signed)
Called patient daughter (okay per DPR). Patient daughter states that her mother is not doing well. Was recently discharged from the hospital on 02/11. States they sent her home on IV ABX- they have continued with these, however her mother is worse. She is SOB, weak, pale and sleeping a lot. Patient states she feels like a "wet rag".   I asked if patient had a fever patient daughter states temp has been showing 95 or 94 at times. When asked about weight gain, daughter denied weighing at home, but states she is SOB and abdominal area seems distended.   Patient daughter wanted [place] to call down to the ED to advise them she needed to be seen, however I recommended that they contact 911 and have them transport.   Daughter verbalized that she didn't not want to bring her back to [facility], but I advised she should be seen by the closest hospital, daughter requested she go to [facility], advised if this is what she wanted she would have to transport her herself, daughter is not sure this is the best option. Before call ended, I did advise her to again call 911. Daughter verbalized understanding.   Advised I would route to primary MD/RN

## 2023-06-20 NOTE — H&P (Signed)
History and Physical    Tina Peters GEX:[id number] DOB: Sep 27, 1950 DOA: 06/20/2023  Referring MD/NP/PA:   PCP: Erasmo Downer, MD   Patient coming from:  The patient is coming from home.     Chief Complaint: SOB, chest pain and abdominal pain  HPI: Tina Peters is a 73 y.o. female with medical history significant of A-fib on Eliquis, hypertension, CKD stage IIIa, prior history of stroke, dCHF, hypertension, HLD, depression with anxiety, s/p bioprosthetic AVR in 10/2021, recent admission due to endocarditis on Rocephin via PICC line, lumbar radiculopathy, who presents with shortness breath, chest pain, abdominal pain.  Patient was recently hospitalized from 2/3 - 2/11 due to mitral valve endocarditis and Gemella bacteremia. TEE 02/05 showing mobile opacity on posterior leaflet mitral valve prosthetic valve. Cardiology following, have discussed w/ CT surgery and recs for NO surgery/replacement at this time, favor plan for repeat TEE in 6 weeks to reevaluate mitral valve. Pt is discharged on 6 weeks of IV Rocephin.  PICC line is placed on 2/10.  Pt states that she started having shortness of breath since this morning, which has been progressively worsening.  Patient has cough with clear mucus production.  Also reports left-sided chest pain, no fever or chills.  She has diffused abdominal pain, which is constant, aching, moderate, nonradiating.  She has nausea, dry heaves.  She states that she has some diarrhea recently, which has resolved.  Today no diarrhea.  Denies symptoms of UTI.  She complains of lower back pain.  Of note, patient had MRI spine L and T spine which had no evidence osteo/discitis.   Data reviewed independently and ED Course: pt was found to have BNP 1353, WBC 20.7 (12.5 on 06/17/2023), negative PCR for COVID, flu and RSV, lactic acid 4.8 --> 3.8, temperature normal, blood pressure 118/66, heart rate 90, RR 28, worsening renal function, liver function (ALP 221, AST 102,  ALT 43, total bilirubin 2.1.  Patient is admitted to telemetry bed as inpatient.   Chest x-ray: *Mild central pulmonary vascular congestion. No frank pulmonary edema. *Bilateral pleural effusions and atelectasis with or without consolidation. Correlate clinically.  CTA: 1. No evidence of pulmonary embolism to the lobar branch level. 2. Small-moderate right and small left pleural effusions with compressive atelectasis. 3. Dilated pulmonary trunk, which can be seen in the setting of pulmonary arterial hypertension. 4. Reflux of contrast into the IVC and hepatic veins, suggestive of right heart dysfunction. 5. Slight interval increase in size of several mildly enlarged mediastinal lymph nodes, likely reactive. 6. Aortic and coronary artery atherosclerosis (ICD10-I70.0).   EKG: I have personally reviewed.  A-fib, QTc 502, low voltage, RAD, poor R wave progression   Review of Systems:   General: no fevers, chills, no body weight gain, has poor appetite, has fatigue HEENT: no blurry vision, hearing changes or sore throat Respiratory: has dyspnea, coughing, no wheezing CV: has chest pain, no palpitations GI: has nausea, dry heaves, abdominal pain, no diarrhea, constipation GU: no dysuria, burning on urination, increased urinary frequency, hematuria  Ext: Has  leg edema Neuro: no unilateral weakness, numbness, or tingling, no vision change or hearing loss Skin: no rash, no skin tear. MSK: No muscle spasm, no deformity, no limitation of range of movement in spin Heme: No easy bruising.  Travel history: No recent long distant travel.   Allergy: No Known Allergies  Past Medical History:  Diagnosis Date   Allergy    Anxiety    Arthritis    Asthma  Chronic back pain    Chronic combined systolic (congestive) and diastolic (congestive) heart failure (HCC)    a. 02/2020 Echo: EF 60-65%, no rwma. Gr2 DD. Nl RV size/fxn. Mild LAE. Mild MR; b. 10/2021 Echo: [facility] following MVR) EF 40 to  45%.   Depression    Diverticulitis    Endocarditis    a. 10/2021 S sanguinous bacteremia w/ MV veg-->81mm Mitris bioprosthetic valve, Maze, and LAA clip.   Hyperlipidemia    Hypertension    Lipoma    Mild mitral regurgitation    a. 08/2016 Echo: mild MR; b. 02/2020 Echo: Mild MR.   Neuromuscular disorder (HCC)    Obesity    Obstructive sleep apnea    PAF (paroxysmal atrial fibrillation) (HCC)    a. Dx 05/2016--> Xarelto (CHA2DS2VASc = 3); b. 08/2016 Echo: EF 55-60%, no rwma, mild MR, mod dil LA; c. 03/2020 Zio: Avg HR 61 w/ Afib burden of 24% - max rate 189 (avg 93). 2 pauses - longest 4.7 secs post-conversion pauses (occurred @ 5:42 am); d. 04/2022 s/p DCCV; e. 08/2022 s/p DCCV.   Paroxysmal SVT (supraventricular tachycardia) (HCC)    S/P mitral valve replacement    a. 10/2021 [facility] - Endocarditis/MV Veg-->35mm Mitris bioprosthetic valve, Maze, and LAA clip.   Sleep apnea    Stroke [facility])    a. 02/2020 MRI/A: multifocal acute ischemia in R MCA territory predominantly involving post insula and R middle frontal gyrus. Nl MRA.   Tachy-brady syndrome (HCC)    Tobacco abuse     Past Surgical History:  Procedure Laterality Date   ACHILLES TENDON SURGERY Left    BACK SURGERY     2 ruptured discs in L spine, no hardware   CARDIOVERSION N/A 04/22/2022   Procedure: CARDIOVERSION;  Surgeon: Iran Ouch, MD;  Location: [facility] ORS;  Service: Cardiovascular;  Laterality: N/A;   CARDIOVERSION N/A 08/28/2022   Procedure: CARDIOVERSION;  Surgeon: Debbe Odea, MD;  Location: [facility] ORS;  Service: Cardiovascular;  Laterality: N/A;   KNEE ARTHROSCOPY W/ MENISCAL REPAIR Right    in the 1990s   SALPINGOOPHORECTOMY Bilateral    TEE WITHOUT CARDIOVERSION N/A 06/11/2023   Procedure: TRANSESOPHAGEAL ECHOCARDIOGRAM (TEE);  Surgeon: Antonieta Iba, MD;  Location: [facility] ORS;  Service: Cardiovascular;  Laterality: N/A;   TUMOR REMOVAL     neck, ?lipoma   VALVE REPLACEMENT  10/04/2021    Social  History:  reports that she quit smoking about 10 years ago. Her smoking use included cigarettes. She started smoking about 50 years ago. She has a 10 pack-year smoking history. She has never used smokeless tobacco. She reports current alcohol use. She reports that she does not use drugs.  Family History:  Family History  Problem Relation Age of Onset   Stroke Mother    Diabetes Mother    Fibrocystic breast disease Mother    CAD Father    Alcoholism Father    Fibrocystic breast disease Sister    Diabetes Mellitus I Grandson    Breast cancer Neg Hx    Colon cancer Neg Hx      Prior to Admission medications   Medication Sig Start Date End Date Taking? Authorizing Provider  acetaminophen (TYLENOL) 325 MG tablet Take by mouth. 4 tablets as needed    [provider]  albuterol (VENTOLIN HFA) 108 (90 Base) MCG/ACT inhaler Inhale 2 puffs into the lungs every 6 (six) hours as needed for wheezing or shortness of breath. 08/17/21   Erasmo Downer, MD  amiodarone (PACERONE) 200 MG tablet Take 1 tablet (200 mg total) by mouth daily. Overdue follow up visit.  PLEASE CALL OFFICE TO SCHEDULE APPOINTMENT PRIOR TO NEXT REFILL 04/17/23   Sherie Don, NP  apixaban (ELIQUIS) 5 MG TABS tablet Take 1 tablet (5 mg total) by mouth 2 (two) times daily. 06/17/23   Sunnie Nielsen, DO  aspirin EC 81 MG tablet Take 1 tablet (81 mg total) by mouth daily. Swallow whole. 06/18/23   Sunnie Nielsen, DO  atorvastatin (LIPITOR) 40 MG tablet TAKE 1 TABLET BY MOUTH EVERY DAY 12/09/22   Erasmo Downer, MD  cefTRIAXone (ROCEPHIN) IVPB Inject 2 g into the vein daily. Indication:  Bacteremia with prosthetic mitral valve endocarditis First Dose: Yes Last Day of Therapy:  07/22/2023 Labs - Once weekly:  CBC/D, CMP, ESR and CRP Fax weekly lab results  promptly to [phone] Method of administration: IV Push Method of administration may be changed at the discretion of home infusion pharmacist based upon  assessment of the patient and/or caregiver's ability to self-administer the medication ordered. Please pull PIC at completion of IV antibiotics Call [phone] twith any questions or critical value 06/18/23 07/22/23  Sunnie Nielsen, DO  cefTRIAXone 2 g in sodium chloride 0.9 % 100 mL Inject 2 g into the vein daily. 06/18/23   Sunnie Nielsen, DO  hydrocortisone 2.5 % cream Apply 1 application topically as needed. 03/10/20   [provider]  methocarbamol (ROBAXIN) 500 MG tablet TAKE 1 TABLET BY MOUTH EVERY 8 HOURS AS NEEDED FOR MUSCLE SPASMS. 04/16/23   Bacigalupo, Marzella Schlein, MD  metoprolol tartrate (LOPRESSOR) 50 MG tablet Take 1 tablet (50 mg total) by mouth 2 (two) times daily. 06/17/23   Sunnie Nielsen, DO  ondansetron (ZOFRAN) 4 MG tablet Take 1 tablet (4 mg total) by mouth every 6 (six) hours as needed for nausea. 06/17/23   Sunnie Nielsen, DO  ondansetron (ZOFRAN-ODT) 4 MG disintegrating tablet Take 1 tablet (4 mg total) by mouth every 8 (eight) hours as needed for nausea or vomiting. 06/03/23   Danelle Berry, PA-C  Probiotic, Lactobacillus, CAPS Take 1 capsule by mouth in the morning and at bedtime. 06/17/23   Sunnie Nielsen, DO  sertraline (ZOLOFT) 50 MG tablet TAKE 1 TABLET BY MOUTH EVERY DAY 04/16/23   Erasmo Downer, MD    Physical Exam: Vitals:   06/21/23 0115 06/21/23 0132 06/21/23 0133 06/21/23 0133  BP:  95/81    Pulse: 86  95   Resp: (!) 22 18 (!) 22   Temp:    97.8 F (36.6 C)  TempSrc:    Oral  SpO2: 98% 100% 100%    General: Not in acute distress HEENT:       Eyes: PERRL, EOMI, no jaundice       ENT: No discharge from the ears and nose, no pharynx injection, no tonsillar enlargement.        Neck: positive JVD, no bruit, no mass felt. Heme: No neck lymph node enlargement. Cardiac: S1/S2, RRR, No murmurs, No gallops or rubs. Respiratory: has crackles bilaterally GI: Soft, nondistended, nontender, no rebound pain, no organomegaly, BS  present. GU: No hematuria Ext: 1+ pitting leg edema bilaterally. 1+DP/PT pulse bilaterally. Musculoskeletal: No joint deformities, No joint redness or warmth, no limitation of ROM in spin. Skin: No rashes.  Neuro: Alert, oriented X3, cranial nerves II-XII grossly intact, moves all extremities normally. Muscle strength 5/5 in all extremities, sensation to light touch intact. Brachial reflex 2+ bilaterally. Knee reflex  1+ bilaterally. Negative Babinski's sign. Normal finger to nose test. Psych: Patient is not psychotic, no suicidal or hemocidal ideation.  Labs on Admission: I have personally reviewed following labs and imaging studies  CBC: Recent Labs  Lab 06/14/23 0341 06/15/23 0342 06/16/23 0404 06/17/23 0554 06/20/23 1401  WBC 12.7* 12.7* 10.9* 12.5* 20.7*  HGB 8.9* 9.1* 8.9* 8.9* 9.2*  HCT 28.1* 29.0* 28.5* 29.2* 31.0*  MCV 84.1 85.8 86.4 86.4 89.9  PLT 168 200 201 198 186   Basic Metabolic Panel: Recent Labs  Lab 06/14/23 0341 06/14/23 0344 06/15/23 0342 06/17/23 0554 06/20/23 1401 06/20/23 1808  NA 137 135 134* 132* 135  --   K 3.8 3.8 4.1 4.2 5.1  --   CL 106 105 104 101 105  --   CO2 21* 21* 20* 21* 17*  --   GLUCOSE 111* 111* 104* 118* 117*  --   BUN 21 22 22 21  26*  --   CREATININE 2.42* 2.45* 2.19* 1.63* 1.53*  --   CALCIUM 8.2* 8.0* 8.3* 8.1* 8.6*  --   MG 2.1  --   --   --   --  1.8  PHOS  --  4.3  --   --   --   --    GFR: Estimated Creatinine Clearance: 39.4 mL/min (A) (by C-G formula based on SCr of 1.53 mg/dL (H)). Liver Function Tests: Recent Labs  Lab 06/14/23 0344 06/20/23 1401  AST  --  102*  ALT  --  43  ALKPHOS  --  221*  BILITOT  --  2.1*  PROT  --  7.2  ALBUMIN 2.6* 2.9*   Recent Labs  Lab 06/20/23 1401  LIPASE 28   No results for input(s): "AMMONIA" in the last 168 hours. Coagulation Profile: No results for input(s): "INR", "PROTIME" in the last 168 hours. Cardiac Enzymes: No results for input(s): "CKTOTAL", "CKMB",  "CKMBINDEX", "TROPONINI" in the last 168 hours. BNP (last 3 results) No results for input(s): "PROBNP" in the last 8760 hours. HbA1C: No results for input(s): "HGBA1C" in the last 72 hours. CBG: No results for input(s): "GLUCAP" in the last 168 hours. Lipid Profile: No results for input(s): "CHOL", "HDL", "LDLCALC", "TRIG", "CHOLHDL", "LDLDIRECT" in the last 72 hours. Thyroid Function Tests: No results for input(s): "TSH", "T4TOTAL", "FREET4", "T3FREE", "THYROIDAB" in the last 72 hours. Anemia Panel: No results for input(s): "VITAMINB12", "FOLATE", "FERRITIN", "TIBC", "IRON", "RETICCTPCT" in the last 72 hours. Urine analysis:    Component Value Date/Time   COLORURINE STRAW (A) 06/20/2023 2114   APPEARANCEUR CLEAR (A) 06/20/2023 2114   LABSPEC 1.010 06/20/2023 2114   PHURINE 5.0 06/20/2023 2114   GLUCOSEU NEGATIVE 06/20/2023 2114   HGBUR NEGATIVE 06/20/2023 2114   BILIRUBINUR NEGATIVE 06/20/2023 2114   KETONESUR NEGATIVE 06/20/2023 2114   PROTEINUR NEGATIVE 06/20/2023 2114   NITRITE NEGATIVE 06/20/2023 2114   LEUKOCYTESUR NEGATIVE 06/20/2023 2114   Sepsis Labs: @LABRCNTIP (procalcitonin:4,lacticidven:4) ) Recent Results (from the past 240 hours)  C Difficile Quick Screen w PCR reflex     Status: None   Collection Time: 06/11/23  9:28 PM   Specimen: STOOL  Result Value Ref Range Status   C Diff antigen NEGATIVE NEGATIVE Final   C Diff toxin NEGATIVE NEGATIVE Final   C Diff interpretation No C. difficile detected.  Final    Comment: Performed at [facility], [address] [place], [place] [address]  Culture, blood (Routine X 2) w Reflex to ID Panel  Status: None   Collection Time: 06/13/23  6:26 AM   Specimen: BLOOD LEFT ARM  Result Value Ref Range Status   Specimen Description   Final    BLOOD LEFT ARM Performed at [facility], [address] [place], [place] [address]    Special Requests   Final    BOTTLES DRAWN AEROBIC AND ANAEROBIC Blood  Culture adequate volume Performed at [facility], [address], [place], [place] [address]    Culture   Final    NO GROWTH 5 DAYS Performed at [facility], [address], [place], [place] [address]    Report Status 06/18/2023 FINAL  Final  Culture, blood (Routine X 2) w Reflex to ID Panel     Status: None   Collection Time: 06/13/23  6:29 AM   Specimen: BLOOD  Result Value Ref Range Status   Specimen Description BLOOD BLOOD LEFT HAND  Final   Special Requests   Final    BOTTLES DRAWN AEROBIC AND ANAEROBIC Blood Culture results may not be optimal due to an inadequate volume of blood received in culture bottles   Culture   Final    NO GROWTH 5 DAYS Performed at [facility], [address], [place], [place] [address]    Report Status 06/18/2023 FINAL  Final  Resp panel by RT-PCR (RSV, Flu A&B, Covid) Anterior Nasal Swab     Status: None   Collection Time: 06/20/23  6:08 PM   Specimen: Anterior Nasal Swab  Result Value Ref Range Status   SARS Coronavirus 2 by RT PCR NEGATIVE NEGATIVE Final    Comment: (NOTE) SARS-CoV-2 target nucleic acids are NOT DETECTED.  The SARS-CoV-2 RNA is generally detectable in upper respiratory specimens during the acute phase of infection. The lowest concentration of SARS-CoV-2 viral copies this assay can detect is 138 copies/mL. A negative result does not preclude SARS-Cov-2 infection and should not be used as the sole basis for treatment or other patient management decisions. A negative result may occur with  improper specimen collection/handling, submission of specimen other than nasopharyngeal swab, presence of viral mutation(s) within the areas targeted by this assay, and inadequate number of viral copies(<138 copies/mL). A negative result must be combined with clinical observations, patient history, and epidemiological information. The expected result is Negative.  Fact Sheet for Patients:   BloggerCourse.com  Fact Sheet for Healthcare Providers:  SeriousBroker.it  This test is no t yet approved or cleared by the [place] FDA and  has been authorized for detection and/or diagnosis of SARS-CoV-2 by FDA under an Emergency Use Authorization (EUA). This EUA will remain  in effect (meaning this test can be used) for the duration of the COVID-19 declaration under Section 564(b)(1) of the Act, 21 U.S.C.section 360bbb-3(b)(1), unless the authorization is terminated  or revoked sooner.       Influenza A by PCR NEGATIVE NEGATIVE Final   Influenza B by PCR NEGATIVE NEGATIVE Final    Comment: (NOTE) The Xpert Xpress SARS-CoV-2/FLU/RSV plus assay is intended as an aid in the diagnosis of influenza from Nasopharyngeal swab specimens and should not be used as a sole basis for treatment. Nasal washings and aspirates are unacceptable for Xpert Xpress SARS-CoV-2/FLU/RSV testing.  Fact Sheet for Patients: BloggerCourse.com  Fact Sheet for Healthcare Providers: SeriousBroker.it  This test is not yet approved or cleared by the [place] FDA and has been authorized for detection and/or diagnosis of SARS-CoV-2 by FDA under an Emergency Use Authorization (  EUA). This EUA will remain in effect (meaning this test can be used) for the duration of the COVID-19 declaration under Section 564(b)(1) of the Act, 21 U.S.C. section 360bbb-3(b)(1), unless the authorization is terminated or revoked.     Resp Syncytial Virus by PCR NEGATIVE NEGATIVE Final    Comment: (NOTE) Fact Sheet for Patients: BloggerCourse.com  Fact Sheet for Healthcare Providers: SeriousBroker.it  This test is not yet approved or cleared by the [place] FDA and has been authorized for detection and/or diagnosis of SARS-CoV-2 by FDA under an Emergency Use  Authorization (EUA). This EUA will remain in effect (meaning this test can be used) for the duration of the COVID-19 declaration under Section 564(b)(1) of the Act, 21 U.S.C. section 360bbb-3(b)(1), unless the authorization is terminated or revoked.  Performed at [facility], [address] [place], [place] [address]      Radiological Exams on Admission:   Assessment/Plan Principal Problem:   Acute on chronic diastolic CHF (congestive heart failure) (HCC) Active Problems:   Myocardial injury   Endocarditis of mitral valve   Pleural effusion   Elevated lactic acid level   Essential hypertension   Chronic a-fib (HCC)   History of CVA (cerebrovascular accident)   Hyperlipidemia   Lumbar radiculopathy   OSA (obstructive sleep apnea)   Abnormal LFTs   Depression with anxiety   CKD stage 3a, GFR 45-59 ml/min (HCC)   Obesity, Class II, BMI 35-39.9   Assessment and Plan:   Acute on chronic diastolic CHF (congestive heart failure) [facility]): Patient has SOB, leg edema, elevated BNP 1353, vascular congestion on chest x-ray, clinically consistent with CHF exacerbation.  Recent TEE showed EF 55-60% on 06/11/2023.  -Will admit to tele bed   as inpatient -Lasix 40 mg bid by IV -Daily weights -strict I/O's -Low salt diet -Fluid restriction -As needed bronchodilators for shortness of breath  Myocardial injury: trop  105 --> 107.  Likely due to ischemia.  Patient has chest pain, but CTA negative for PE. -Aspirin, Lipitor  Recent endocarditis of mitral valve: Patient is IV Rocephin.  Patient has worsening leukocytosis with WBC 20.7 (12.5 on 06/17/2023).  Patient has a bilateral pleural effusion, cannot completely rule out pneumonia.  Will broaden antibiotics. -Vancomycin, cefepime -Flagyl was added due to abdominal pain -Repeat blood culture  Pleural effusion: Possibly due to CHF -Patient is on IV Lasix.  If no improvement, may consider thoracentesis.  Elevated lactic  acid level: Lactic acid of 4.8 --> 3.8.  Patient has leukocytosis, but no fever, clinically does not seem to have sepsis. -Will not give IV fluid due to CHF exacerbation -Trend lactic acid level  Essential hypertension -IV hydralazine as needed -Metoprolol  Chronic a-fib (HCC) -Toprol -Eliquis  History of CVA (cerebrovascular accident) -Aspirin, Lipitor  Hyperlipidemia -Lipitor  Lumbar radiculopathy -As needed morphine, oxycodone, Robaxin, Tylenol  OSA (obstructive sleep apnea) -CPAP  Abnormal LFTs: Patient has diffuse abdominal pain.  Lipase normal 28. -Follow-up right upper quadrant ultrasound -Patient is on broad antibiotics as above  Depression with anxiety -Continue home medications: Zoloft  CKD stage 3a, GFR 45-59 ml/min (HCC): Renal function is worse than baseline.  Recent baseline creatinine 1.03 on 06/17/2023.  Her creatinine is 1.53, BUN 26, GFR 36.  May be due to cardiorenal syndrome. -Avoid renal toxic medications. -Will not give IV fluid due to CHF exacerbation -Monitor renal function by BMP closely  Obesity, Class II, BMI 35-39.9: Body weight 102 kg, BMI 37.46 -Encourage losing weight -Exercise and  healthy diet        DVT ppx: on Eliquis  Code Status: Full code   Family Communication:     not done, no family member is at bed side.          Disposition Plan:  Anticipate discharge back to previous environment  Consults called:  none  Admission status and Level of care: Telemetry Cardiac:  as inpt        Dispo: The patient is from: Home              Anticipated d/c is to: Home              Anticipated d/c date is: 2 days              Patient currently is not medically stable to d/c.    Severity of Illness:  The appropriate patient status for this patient is INPATIENT. Inpatient status is judged to be reasonable and necessary in order to provide the required intensity of service to ensure the patient's safety. The patient's presenting  symptoms, physical exam findings, and initial radiographic and laboratory data in the context of their chronic comorbidities is felt to place them at high risk for further clinical deterioration. Furthermore, it is not anticipated that the patient will be medically stable for discharge from the hospital within 2 midnights of admission.   * I certify that at the point of admission it is my clinical judgment that the patient will require inpatient hospital care spanning beyond 2 midnights from the point of admission due to high intensity of service, high risk for further deterioration and high frequency of surveillance required.*       Date of Service 06/21/2023    Lorretta Harp [facility]   If 7PM-7AM, please contact night-coverage [email or web address] 06/21/2023, 1:49 AM

## 2023-06-20 NOTE — Progress Notes (Signed)
Pharmacy Antibiotic Note  Tina Peters is a 73 y.o. female admitted on 06/20/2023 with pneumonia.  Pharmacy has been consulted for Cefepime and Vancomyin dosing.  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 Discharged from hospital 3 days PTA. -WBC 20.7  (12.5 on 06/17/23), afebrile  Plan: Patient received Vancomycin 1000 mg IV in ED. Will add Vancomycin 1250 mg IV x 1 for a total Loading dose of 2250 mg IV  Will continue with Vancomycin 1000 mg IV Q 24 hrs. Goal AUC 400-550. Expected AUC: 466 Cmin 13.8 SCr used: 1.53 VD 0.7   BMI 37     Follow renal fxn, cultures, length of therapy, etc    Temp (24hrs), Avg:97.9 F (36.6 C), Min:97.8 F (36.6 C), Max:98 F (36.7 C)  Recent Labs  Lab 06/14/23 0341 06/14/23 0344 06/15/23 0342 06/16/23 0404 06/17/23 0554 06/20/23 1401 06/20/23 1808  WBC 12.7*  --  12.7* 10.9* 12.5* 20.7*  --   CREATININE 2.42* 2.45* 2.19*  --  1.63* 1.53*  --   LATICACIDVEN  --   --   --   --   --   --  4.8*    Estimated Creatinine Clearance: 39.4 mL/min (A) (by C-G formula based on SCr of 1.53 mg/dL (H)).    No Known Allergies  Antimicrobials this admission: Metronidazole x1 in ED  vancomycin 2/14 >>   Cefepime 2/14>>  Dose adjustments this admission:   Microbiology results: 2/14 BCx: pending   UCx:    2/14 Sputum: pending  2/14 MRSA PCR: pending  Thank you for allowing pharmacy to be a part of this patient's care.  Bari Mantis PharmD Clinical Pharmacist 06/20/2023

## 2023-06-20 NOTE — Telephone Encounter (Signed)
Called patient daughter, see telephone note.  Thanks!

## 2023-06-21 ENCOUNTER — Inpatient Hospital Stay (HOSPITAL_COMMUNITY)
Admit: 2023-06-21 | Discharge: 2023-06-21 | Disposition: A | Discharge status: Home / Self Care | Payer: Medicare HMO | Attending: Cardiovascular Disease | Admitting: Cardiovascular Disease

## 2023-06-21 ENCOUNTER — Inpatient Hospital Stay: Payer: Medicare HMO

## 2023-06-21 ENCOUNTER — Encounter: Payer: Self-pay | Admitting: Internal Medicine

## 2023-06-21 DIAGNOSIS — N189 Chronic kidney disease, unspecified: Secondary | ICD-10-CM

## 2023-06-21 DIAGNOSIS — I483 Typical atrial flutter: Secondary | ICD-10-CM

## 2023-06-21 DIAGNOSIS — D72829 Elevated white blood cell count, unspecified: Secondary | ICD-10-CM

## 2023-06-21 DIAGNOSIS — I272 Pulmonary hypertension, unspecified: Secondary | ICD-10-CM | POA: Diagnosis not present

## 2023-06-21 DIAGNOSIS — N179 Acute kidney failure, unspecified: Secondary | ICD-10-CM

## 2023-06-21 DIAGNOSIS — E785 Hyperlipidemia, unspecified: Secondary | ICD-10-CM

## 2023-06-21 DIAGNOSIS — I5033 Acute on chronic diastolic (congestive) heart failure: Secondary | ICD-10-CM

## 2023-06-21 DIAGNOSIS — J90 Pleural effusion, not elsewhere classified: Secondary | ICD-10-CM

## 2023-06-21 DIAGNOSIS — I38 Endocarditis, valve unspecified: Secondary | ICD-10-CM

## 2023-06-21 DIAGNOSIS — E66812 Obesity, class 2: Secondary | ICD-10-CM

## 2023-06-21 DIAGNOSIS — I5A Non-ischemic myocardial injury (non-traumatic): Secondary | ICD-10-CM | POA: Diagnosis present

## 2023-06-21 DIAGNOSIS — E871 Hypo-osmolality and hyponatremia: Secondary | ICD-10-CM | POA: Insufficient documentation

## 2023-06-21 DIAGNOSIS — R0602 Shortness of breath: Secondary | ICD-10-CM

## 2023-06-21 DIAGNOSIS — Z952 Presence of prosthetic heart valve: Secondary | ICD-10-CM

## 2023-06-21 DIAGNOSIS — I50813 Acute on chronic right heart failure: Secondary | ICD-10-CM | POA: Diagnosis not present

## 2023-06-21 DIAGNOSIS — I340 Nonrheumatic mitral (valve) insufficiency: Secondary | ICD-10-CM

## 2023-06-21 DIAGNOSIS — I059 Rheumatic mitral valve disease, unspecified: Secondary | ICD-10-CM

## 2023-06-21 DIAGNOSIS — G4733 Obstructive sleep apnea (adult) (pediatric): Secondary | ICD-10-CM

## 2023-06-21 DIAGNOSIS — R7989 Other specified abnormal findings of blood chemistry: Secondary | ICD-10-CM

## 2023-06-21 DIAGNOSIS — D649 Anemia, unspecified: Secondary | ICD-10-CM | POA: Insufficient documentation

## 2023-06-21 DIAGNOSIS — Z8673 Personal history of transient ischemic attack (TIA), and cerebral infarction without residual deficits: Secondary | ICD-10-CM

## 2023-06-21 DIAGNOSIS — I482 Chronic atrial fibrillation, unspecified: Secondary | ICD-10-CM

## 2023-06-21 DIAGNOSIS — N183 Chronic kidney disease, stage 3 unspecified: Secondary | ICD-10-CM

## 2023-06-21 DIAGNOSIS — F418 Other specified anxiety disorders: Secondary | ICD-10-CM

## 2023-06-21 DIAGNOSIS — R1010 Upper abdominal pain, unspecified: Secondary | ICD-10-CM

## 2023-06-21 DIAGNOSIS — I10 Essential (primary) hypertension: Secondary | ICD-10-CM

## 2023-06-21 LAB — CBC
HCT: 29.2 % — ABNORMAL LOW (ref 36.0–46.0)
Hemoglobin: 8.8 g/dL — ABNORMAL LOW (ref 12.0–15.0)
MCH: 26.3 pg (ref 26.0–34.0)
MCHC: 30.1 g/dL (ref 30.0–36.0)
MCV: 87.4 fL (ref 80.0–100.0)
Platelets: 169 10*3/uL (ref 150–400)
RBC: 3.34 MIL/uL — ABNORMAL LOW (ref 3.87–5.11)
RDW: 18.3 % — ABNORMAL HIGH (ref 11.5–15.5)
WBC: 23.5 10*3/uL — ABNORMAL HIGH (ref 4.0–10.5)
nRBC: 1.4 % — ABNORMAL HIGH (ref 0.0–0.2)

## 2023-06-21 LAB — ECHOCARDIOGRAM LIMITED

## 2023-06-21 LAB — COMPREHENSIVE METABOLIC PANEL
ALT: 71 U/L — ABNORMAL HIGH (ref 0–44)
AST: 183 U/L — ABNORMAL HIGH (ref 15–41)
Albumin: 2.8 g/dL — ABNORMAL LOW (ref 3.5–5.0)
Alkaline Phosphatase: 192 U/L — ABNORMAL HIGH (ref 38–126)
Anion gap: 15 (ref 5–15)
BUN: 31 mg/dL — ABNORMAL HIGH (ref 8–23)
CO2: 16 mmol/L — ABNORMAL LOW (ref 22–32)
Calcium: 8.5 mg/dL — ABNORMAL LOW (ref 8.9–10.3)
Chloride: 103 mmol/L (ref 98–111)
Creatinine, Ser: 1.63 mg/dL — ABNORMAL HIGH (ref 0.44–1.00)
GFR, Estimated: 33 mL/min — ABNORMAL LOW (ref >60)
Glucose, Bld: 102 mg/dL — ABNORMAL HIGH (ref 70–99)
Potassium: 5 mmol/L (ref 3.5–5.1)
Sodium: 134 mmol/L — ABNORMAL LOW (ref 135–145)
Total Bilirubin: 2.9 mg/dL — ABNORMAL HIGH (ref 0.0–1.2)
Total Protein: 6.8 g/dL (ref 6.5–8.1)

## 2023-06-21 LAB — LACTIC ACID, PLASMA
Lactic Acid, Venous: 5.3 mmol/L (ref 0.5–1.9)
Lactic Acid, Venous: 5 mmol/L (ref 0.5–1.9)

## 2023-06-21 LAB — VANCOMYCIN, RANDOM: Vancomycin Rm: 16 ug/mL

## 2023-06-21 LAB — C-REACTIVE PROTEIN: CRP: 8.3 mg/dL — ABNORMAL HIGH (ref <1.0)

## 2023-06-21 LAB — MRSA NEXT GEN BY PCR, NASAL: MRSA by PCR Next Gen: NOT DETECTED

## 2023-06-21 LAB — STREP PNEUMONIAE URINARY ANTIGEN: Strep Pneumo Urinary Antigen: NEGATIVE

## 2023-06-21 MED ORDER — AMIODARONE HCL 200 MG PO TABS
200.0000 mg | ORAL_TABLET | Freq: Every day | ORAL | Status: DC
  Administered 2023-06-21: 200 mg via ORAL
  Filled 2023-06-21: qty 1

## 2023-06-21 MED ORDER — ASPIRIN 81 MG PO TBEC
81.0000 mg | DELAYED_RELEASE_TABLET | Freq: Every day | ORAL | Status: DC
  Administered 2023-06-21 – 2023-06-24 (×4): 81 mg via ORAL
  Filled 2023-06-21 (×4): qty 1

## 2023-06-21 MED ORDER — FUROSEMIDE 10 MG/ML IJ SOLN
40.0000 mg | Freq: Two times a day (BID) | INTRAMUSCULAR | Status: DC
  Administered 2023-06-21 – 2023-06-22 (×3): 40 mg via INTRAVENOUS
  Filled 2023-06-21 (×3): qty 4

## 2023-06-21 MED ORDER — METRONIDAZOLE 500 MG/100ML IV SOLN: 500.0000 mg | Freq: Two times a day (BID) | INTRAVENOUS | Status: DC

## 2023-06-21 MED ORDER — TRAMADOL HCL 50 MG PO TABS
50.0000 mg | ORAL_TABLET | Freq: Three times a day (TID) | ORAL | Status: DC | PRN
  Administered 2023-06-21 – 2023-06-27 (×9): 50 mg via ORAL
  Filled 2023-06-21 (×9): qty 1

## 2023-06-21 MED ORDER — VANCOMYCIN VARIABLE DOSE PER UNSTABLE RENAL FUNCTION (PHARMACIST DOSING): Status: DC

## 2023-06-21 MED ORDER — METOPROLOL TARTRATE 50 MG PO TABS
50.0000 mg | ORAL_TABLET | Freq: Two times a day (BID) | ORAL | Status: DC
  Administered 2023-06-21 – 2023-06-27 (×13): 50 mg via ORAL
  Filled 2023-06-21: qty 2
  Filled 2023-06-21: qty 1
  Filled 2023-06-21: qty 2
  Filled 2023-06-21 (×9): qty 1

## 2023-06-21 MED ORDER — PANTOPRAZOLE SODIUM 40 MG PO TBEC
40.0000 mg | DELAYED_RELEASE_TABLET | Freq: Every day | ORAL | Status: DC
  Administered 2023-06-21 – 2023-06-23 (×3): 40 mg via ORAL
  Filled 2023-06-21 (×3): qty 1

## 2023-06-21 MED ORDER — SERTRALINE HCL 50 MG PO TABS
50.0000 mg | ORAL_TABLET | Freq: Every day | ORAL | Status: DC
  Administered 2023-06-21 – 2023-06-27 (×7): 50 mg via ORAL
  Filled 2023-06-21 (×7): qty 1

## 2023-06-21 MED ORDER — NITROGLYCERIN 0.4 MG SL SUBL
0.4000 mg | SUBLINGUAL_TABLET | SUBLINGUAL | Status: DC | PRN
  Administered 2023-06-21: 0.4 mg via SUBLINGUAL
  Filled 2023-06-21: qty 1

## 2023-06-21 MED ORDER — ACETAMINOPHEN 325 MG PO TABS
650.0000 mg | ORAL_TABLET | Freq: Four times a day (QID) | ORAL | Status: DC | PRN
  Administered 2023-06-22 – 2023-06-26 (×5): 650 mg via ORAL
  Filled 2023-06-21 (×5): qty 2

## 2023-06-21 MED ORDER — APIXABAN 5 MG PO TABS
5.0000 mg | ORAL_TABLET | Freq: Two times a day (BID) | ORAL | Status: DC
  Administered 2023-06-21 – 2023-06-27 (×13): 5 mg via ORAL
  Filled 2023-06-21 (×14): qty 1

## 2023-06-21 MED ORDER — RISAQUAD PO CAPS
1.0000 | ORAL_CAPSULE | Freq: Two times a day (BID) | ORAL | Status: DC
Start: 2023-06-21 — End: 2023-06-27
  Administered 2023-06-21 – 2023-06-27 (×13): 1 via ORAL
  Filled 2023-06-21 (×13): qty 1

## 2023-06-21 MED ORDER — ONDANSETRON HCL 4 MG/2ML IJ SOLN
4.0000 mg | Freq: Four times a day (QID) | INTRAMUSCULAR | Status: DC | PRN
  Administered 2023-06-21: 4 mg via INTRAVENOUS
  Filled 2023-06-21: qty 2

## 2023-06-21 MED ORDER — ATORVASTATIN CALCIUM 20 MG PO TABS
40.0000 mg | ORAL_TABLET | Freq: Every day | ORAL | Status: DC
  Administered 2023-06-21 – 2023-06-23 (×3): 40 mg via ORAL
  Filled 2023-06-21 (×3): qty 2

## 2023-06-21 MED ORDER — METRONIDAZOLE 500 MG/100ML IV SOLN
500.0000 mg | Freq: Two times a day (BID) | INTRAVENOUS | Status: DC
Start: 2023-06-21 — End: 2023-06-21
  Administered 2023-06-21: 500 mg via INTRAVENOUS
  Filled 2023-06-21: qty 100

## 2023-06-21 MED ORDER — MORPHINE SULFATE (PF) 2 MG/ML IV SOLN
1.0000 mg | Freq: Once | INTRAVENOUS | Status: AC
  Administered 2023-06-21: 1 mg via INTRAVENOUS
  Filled 2023-06-21: qty 1

## 2023-06-21 NOTE — Assessment & Plan Note (Signed)
 -  CPAP at night

## 2023-06-21 NOTE — Assessment & Plan Note (Signed)
 On atorvastatin

## 2023-06-21 NOTE — Assessment & Plan Note (Signed)
Continue metoprolol 

## 2023-06-21 NOTE — Assessment & Plan Note (Signed)
Patient has baseline creatinine around 1.02.  Had acute kidney injury on last hospitalization.  Creatinine peaked at 1.89.  Creatinine down to 1.7

## 2023-06-21 NOTE — Assessment & Plan Note (Addendum)
Could be secondary to infection.  Right upper quadrant shows gallstones but no evidence of cholecystitis.

## 2023-06-21 NOTE — ED Notes (Signed)
Pt notified she will be getting a shared room. She verbalized understanding.

## 2023-06-21 NOTE — Consult Note (Signed)
Cardiology Consultation   Patient ID: JAMAICA INTHAVONG MRN: [id number]; DOB: [birth year]  Admit date: 06/20/2023 Date of Consult: 06/21/2023  PCP:  Erasmo Downer, MD   [facility] Providers Cardiologist:  Lorine Bears, MD  Electrophysiologist:  Lanier Prude, MD       Patient Profile:   Tina Peters is a 73 y.o. female with a hx of paroxysmal atrial fibrillation, tachybradycardia syndrome, PSVT, hypertension, hyperlipidemia, stroke, obstructive sleep apnea on CPAP, CKD 3, endocarditis status post MVR, asthma, tobacco abuse, back pain, and diverticulitis, who is being seen 06/21/2023 for the evaluation of heart failure exacerbation at the request of Dr Renae Gloss.  History of Present Illness:     [name] previously had an episode of SVT in 2016 in the setting of hypokalemia thought to be due to to the treatment of hypertension with HCTZ.  She was placed on low-dose, long-acting diltiazem with with improvement in her symptoms and was subsequently switched to metoprolol due to leg edema.  January 2018 she was diagnosed with atrial fibrillation with RVR which converted back to sinus rhythm with diltiazem.  She was placed on Xarelto for CHA2DS2-VASc score of at least 3 for stroke prophylaxis.  In October 2021 she was seen by cardiology and reported an episode of presyncope associated with acute confusion and loss of fine motor skills.  She was referred to the emergency department where head CT showed no evidence of bleeding however focal hypodensity and loss of gray-white differentiation in the high right frontal lobe was present concerning for acute or early subacute infarct.  She underwent MRI which showed multifocal acute ischemia in the right MCA territory predominantly involving the posterior insula and right middle frontal gyrus.  MRI and carotid Dopplers were unremarkable.  EEG showed no abnormality concerning for potential seizure.  She was subsequently cleared to  resume Xarelto by neurology.  ZIO monitoring following discharge showed predominantly sinus rhythm with 24% atrial fibrillation burden.  She was noted to have a 4.7-second postconversion pause.  She was seen by EP with recommendations to start Multaq in effort to avoid recurrent atrial fibrillation and thus post cardioversion pauses.  In June 2023 she was admitted to [facility] with bacteremia from a infected tooth and was found to have mitral valve vegetation.  She underwent cardiac catheterization for surgery which showed mild nonobstructive CAD.  She had a prolonged hospitalization and underwent mitral valve replacement with a 27 mm Mitris bioprosthetic valve, Maze procedure, left atrial appendage clipping.  Postoperatively she had an acute left lower extremity ischemia which required left common femoral artery exposure, thrombectomy of the left iliac, common femoral, superficial femoral, and profunda arteries as well as 4 compartment fasciotomies.  Postop echocardiogram revealed an LVEF of 40-45%.  She also had postoperative atrial fibrillation which required an elective cardioversion.  She was switched from Multaq to amiodarone during her hospitalization.  She had several medication changes made throughout November and December 2023.  She did undergo a successful cardioversion on April 22, 2022 at [address] J.  Hospitalized at [facility] from 2/3 - 06/17/2023 for endocarditis.  On the evening she presented she had elevated BNP, procalcitonin, and elevated troponin 283 and 331, she was started on heparin infusion, lactic acid was also noted to be 2.6 where she received 1 L of IV fluid and started on broad-spectrum antibiotics with cefepime plus vancomycin plus Flagyl.  She was treated for gastroenteritis with concern for associated sepsis which reasonably ruled out UTI, pneumonia, colitis  or other intra-abdominal bacterial infections.  On 06/11/2023 she underwent a TEE for mitral valve vegetation and gram-positive cocci in her  blood cultures.  She had presented to the facility in sinus rhythm and developed atrial fibrillation RVR on 06/11/2023.  Amiodarone and beta-blocker therapy were adjusted.  She had an MRI completed of the spine that showed no findings to indicate osteomyelitis or discitis.  2/7 she had replete blood cultures with no growth.  CT surgery had no plan for surgical intervention and she was to follow-up with an additional TEE in 6 weeks after PICC line was placed and she was to undergo home health with continued IV antibiotic infusions.  She was considered stable for discharge and was discharged on 06/17/2023.   She presented to the [facility] emergency department on 06/20/2023 via EMS with complaint of shortness of breath since earlier in the morning.  Normally she stated that she wear CPAP to sleep and does not wear home O2.  She was satting 99% on room air.  She stated she had a central line placed on Monday and had been receiving antibiotics for an infection on her mitral valve.  Stated she had middle back and diffuse abdominal pain.  Unfortunately prior to discharge was 3 days prior to presenting back to the emergency department.  She had noted increasing shortness of breath and weakness.  She denied any fevers chills, she denies chest pain palpitations.  She had been taking her antibiotics through PICC line.  Initial vital signs: Blood pressure 126/76, pulse of 87, respirations of 17, temperature of 97.8  Pertinent labs: CO2 of 17, blood glucose of 117, BUN of 26, serum creatinine 1.53, calcium 8.6, AST 102, alkaline phosphatase 21, total bilirubin of 2.1, hemoglobin 9.2, WBCs of 20.7, BNP 1353.9, lactic acid of 4.8, high-sensitivity troponin 105 and 107, respiratory panel negative for COVID, RSV, flu A and B  Imaging: Chest x-ray reveals mid central pulmonary vascular congestion, no frank pulmonary edema, bilateral pleural effusions and atelectasis with or without consolidation; CTA of the chest PE protocol revealed  no evidence of pulmonary embolism to the lower branch level, small to moderate right and small left pleural effusion with comprehensive atelectasis, dilated pulmonary trunk which can be seen in setting of pulmonary arterial hypertension, reflux of contrast into the IVC and hepatic veins, suggestive of right heart dysfunction  Cardiology consulted for heart failure exacerbation in a patient with endocarditis status post mitral valve repair  Past Medical History:  Diagnosis Date   Allergy    Anxiety    Arthritis    Asthma    Chronic back pain    Chronic combined systolic (congestive) and diastolic (congestive) heart failure (HCC)    a. 02/2020 Echo: EF 60-65%, no rwma. Gr2 DD. Nl RV size/fxn. Mild LAE. Mild MR; b. 10/2021 Echo: [facility] following MVR) EF 40 to 45%.   Depression    Diverticulitis    Endocarditis    a. 10/2021 S sanguinous bacteremia w/ MV veg-->36mm Mitris bioprosthetic valve, Maze, and LAA clip.   Hyperlipidemia    Hypertension    Lipoma    Mild mitral regurgitation    a. 08/2016 Echo: mild MR; b. 02/2020 Echo: Mild MR.   Neuromuscular disorder (HCC)    Obesity    Obstructive sleep apnea    PAF (paroxysmal atrial fibrillation) (HCC)    a. Dx 05/2016--> Xarelto (CHA2DS2VASc = 3); b. 08/2016 Echo: EF 55-60%, no rwma, mild MR, mod dil LA; c. 03/2020 Zio: Avg HR 61  w/ Afib burden of 24% - max rate 189 (avg 93). 2 pauses - longest 4.7 secs post-conversion pauses (occurred @ 5:42 am); d. 04/2022 s/p DCCV; e. 08/2022 s/p DCCV.   Paroxysmal SVT (supraventricular tachycardia) (HCC)    S/P mitral valve replacement    a. 10/2021 [facility] - Endocarditis/MV Veg-->38mm Mitris bioprosthetic valve, Maze, and LAA clip.   Sleep apnea    Stroke [facility])    a. 02/2020 MRI/A: multifocal acute ischemia in R MCA territory predominantly involving post insula and R middle frontal gyrus. Nl MRA.   Tachy-brady syndrome (HCC)    Tobacco abuse     Past Surgical History:  Procedure Laterality Date   ACHILLES  TENDON SURGERY Left    BACK SURGERY     2 ruptured discs in L spine, no hardware   CARDIOVERSION N/A 04/22/2022   Procedure: CARDIOVERSION;  Surgeon: Iran Ouch, MD;  Location: [facility] ORS;  Service: Cardiovascular;  Laterality: N/A;   CARDIOVERSION N/A 08/28/2022   Procedure: CARDIOVERSION;  Surgeon: Debbe Odea, MD;  Location: [facility] ORS;  Service: Cardiovascular;  Laterality: N/A;   KNEE ARTHROSCOPY W/ MENISCAL REPAIR Right    in the 1990s   SALPINGOOPHORECTOMY Bilateral    TEE WITHOUT CARDIOVERSION N/A 06/11/2023   Procedure: TRANSESOPHAGEAL ECHOCARDIOGRAM (TEE);  Surgeon: Antonieta Iba, MD;  Location: [facility] ORS;  Service: Cardiovascular;  Laterality: N/A;   TUMOR REMOVAL     neck, ?lipoma   VALVE REPLACEMENT  10/04/2021     Home Medications:  Prior to Admission medications   Medication Sig Start Date End Date Taking? Authorizing Provider  acetaminophen (TYLENOL) 325 MG tablet Take by mouth. 4 tablets as needed   Yes [provider]  albuterol (VENTOLIN HFA) 108 (90 Base) MCG/ACT inhaler Inhale 2 puffs into the lungs every 6 (six) hours as needed for wheezing or shortness of breath. 08/17/21  Yes Bacigalupo, Marzella Schlein, MD  amiodarone (PACERONE) 200 MG tablet Take 1 tablet (200 mg total) by mouth daily. Overdue follow up visit.  PLEASE CALL OFFICE TO SCHEDULE APPOINTMENT PRIOR TO NEXT REFILL 04/17/23  Yes Sherie Don, NP  apixaban (ELIQUIS) 5 MG TABS tablet Take 1 tablet (5 mg total) by mouth 2 (two) times daily. 06/17/23  Yes Sunnie Nielsen, DO  aspirin EC 81 MG tablet Take 1 tablet (81 mg total) by mouth daily. Swallow whole. 06/18/23  Yes Sunnie Nielsen, DO  atorvastatin (LIPITOR) 40 MG tablet TAKE 1 TABLET BY MOUTH EVERY DAY 12/09/22  Yes Bacigalupo, Marzella Schlein, MD  cefTRIAXone (ROCEPHIN) IVPB Inject 2 g into the vein daily. Indication:  Bacteremia with prosthetic mitral valve endocarditis First Dose: Yes Last Day of Therapy:  07/22/2023 Labs - Once weekly:   CBC/D, CMP, ESR and CRP Fax weekly lab results  promptly to [phone] Method of administration: IV Push Method of administration may be changed at the discretion of home infusion pharmacist based upon assessment of the patient and/or caregiver's ability to self-administer the medication ordered. Please pull PIC at completion of IV antibiotics Call [phone] twith any questions or critical value 06/18/23 07/22/23 Yes Sunnie Nielsen, DO  hydrocortisone 2.5 % cream Apply 1 application topically as needed. 03/10/20  Yes [provider]  methocarbamol (ROBAXIN) 500 MG tablet TAKE 1 TABLET BY MOUTH EVERY 8 HOURS AS NEEDED FOR MUSCLE SPASMS. 04/16/23  Yes Bacigalupo, Marzella Schlein, MD  metoprolol tartrate (LOPRESSOR) 50 MG tablet Take 1 tablet (50 mg total) by mouth 2 (two) times daily. 06/17/23  Yes Sunnie Nielsen,  DO  ondansetron (ZOFRAN) 4 MG tablet Take 1 tablet (4 mg total) by mouth every 6 (six) hours as needed for nausea. 06/17/23  Yes Sunnie Nielsen, DO  Probiotic, Lactobacillus, CAPS Take 1 capsule by mouth in the morning and at bedtime. 06/17/23  Yes Sunnie Nielsen, DO  sertraline (ZOLOFT) 50 MG tablet TAKE 1 TABLET BY MOUTH EVERY DAY 04/16/23  Yes Bacigalupo, Marzella Schlein, MD  cefTRIAXone 2 g in sodium chloride 0.9 % 100 mL Inject 2 g into the vein daily. 06/18/23   Sunnie Nielsen, DO  ondansetron (ZOFRAN-ODT) 4 MG disintegrating tablet Take 1 tablet (4 mg total) by mouth every 8 (eight) hours as needed for nausea or vomiting. Patient not taking: Reported on 06/20/2023 06/03/23   Danelle Berry, PA-C    Inpatient Medications: Scheduled Meds:  acidophilus  1 capsule Oral BID   amiodarone  200 mg Oral Daily   apixaban  5 mg Oral BID   aspirin EC  81 mg Oral Daily   atorvastatin  40 mg Oral QHS   Chlorhexidine Gluconate Cloth  6 each Topical Daily   furosemide  40 mg Intravenous Q12H   metoprolol tartrate  50 mg Oral BID   sertraline  50 mg Oral Daily   sodium chloride  flush  10-40 mL Intracatheter Q12H   Continuous Infusions:  ceFEPime (MAXIPIME) IV Stopped (06/21/23 4098)   metronidazole Stopped (06/21/23 0606)   vancomycin     PRN Meds: albuterol, dextromethorphan-guaiFENesin, diphenhydrAMINE, hydrALAZINE, ibuprofen, methocarbamol, morphine injection, oxyCODONE, sodium chloride flush  Allergies:   No Known Allergies  Social History:   Social History   Socioeconomic History   Marital status: Divorced    Spouse name: Not on file   Number of children: 2   Years of education: Not on file   Highest education level: Not on file  Occupational History   Occupation: retired  Tobacco Use   Smoking status: Former    Current packs/day: 0.00    Average packs/day: 0.3 packs/day for 40.0 years (10.0 ttl pk-yrs)    Types: Cigarettes    Start date: 78    Quit date: 2015    Years since quitting: 10.1   Smokeless tobacco: Never   Tobacco comments:    Used to smoke heavily, cut back in 2015  Vaping Use   Vaping status: Never Used  Substance and Sexual Activity   Alcohol use: Yes    Comment: socially, rare intake   Drug use: No   Sexual activity: Yes    Partners: Male    Birth control/protection: Post-menopausal  Other Topics Concern   Not on file  Social History Narrative   Lives alone.   Social Drivers of Health   Financial Resource Strain: Medium Risk (01/27/2023)   Overall Financial Resource Strain (CARDIA)    Difficulty of Paying Living Expenses: Somewhat hard  Food Insecurity: No Food Insecurity (06/17/2023)   Hunger Vital Sign    Worried About Running Out of Food in the Last Year: Never true    Ran Out of Food in the Last Year: Never true  Transportation Needs: No Transportation Needs (06/17/2023)   PRAPARE - Administrator, Civil Service (Medical): No    Lack of Transportation (Non-Medical): No  Physical Activity: Insufficiently Active (01/27/2023)   Exercise Vital Sign    Days of Exercise per Week: 1 day    Minutes of  Exercise per Session: 20 min  Stress: Stress Concern Present (01/27/2023)   [employer] of Occupational  Health - Occupational Stress Questionnaire    Feeling of Stress : To some extent  Social Connections: Socially Isolated (06/17/2023)   Social Connection and Isolation Panel [NHANES]    Frequency of Communication with Friends and Family: More than three times a week    Frequency of Social Gatherings with Friends and Family: Twice a week    Attends Religious Services: Never    Database administrator or Organizations: No    Attends Banker Meetings: Never    Marital Status: Divorced  Catering manager Violence: Not At Risk (06/17/2023)   Humiliation, Afraid, Rape, and Kick questionnaire    Fear of Current or Ex-Partner: No    Emotionally Abused: No    Physically Abused: No    Sexually Abused: No    Family History:    Family History  Problem Relation Age of Onset   Stroke Mother    Diabetes Mother    Fibrocystic breast disease Mother    CAD Father    Alcoholism Father    Fibrocystic breast disease Sister    Diabetes Mellitus I Grandson    Breast cancer Neg Hx    Colon cancer Neg Hx      ROS:  Please see the history of present illness.  Review of Systems  Constitutional:  Positive for malaise/fatigue.  Respiratory:  Positive for shortness of breath.   Gastrointestinal:  Positive for abdominal pain.  Neurological:  Positive for weakness.    All other ROS reviewed and negative.     Physical Exam/Data:   Vitals:   06/21/23 0300 06/21/23 0600 06/21/23 0602 06/21/23 0602  BP: 99/60 107/71    Pulse: 85 81    Resp: (!) 22 (!) 23    Temp:   97.8 F (36.6 C) 97.8 F (36.6 C)  TempSrc:   Oral Oral  SpO2: 100% 100% 100%     Intake/Output Summary (Last 24 hours) at 06/21/2023 0811 Last data filed at 06/21/2023 0655 Gross per 24 hour  Intake 549.12 ml  Output 200 ml  Net 349.12 ml      06/11/2023   12:05 PM 06/09/2023    9:35 PM 06/03/2023    2:17 PM   Last 3 Weights  Weight (lbs) 224 lb 13.9 oz 224 lb 13.9 oz 226 lb  Weight (kg) 102 kg 102 kg 102.513 kg     There is no height or weight on file to calculate BMI.  General:  Well nourished, well developed, in no acute distress HEENT: normal Neck: unable to determine JVD due to body habitus Vascular: No carotid bruits; Distal pulses 2+ bilaterally Cardiac:  normal S1, S2; RRR; no murmur  Lungs:  scattered crackles with to auscultation bilaterally, respirations are unlabored at rest Abd: soft, mildly tender, obese, no hepatomegaly  Ext: 1+ edema Musculoskeletal:  No deformities, BUE and BLE strength normal and equal Skin: warm and dry  Neuro:  CNs 2-12 intact, no focal abnormalities noted Psych:  Normal affect   EKG:  The EKG was personally reviewed and demonstrates: Atrial fibrillation with a rate of 85, left posterior fascicular block, low voltage, poor R wave progression  Telemetry:  Telemetry was personally reviewed and demonstrates:  sinus rates in the 80's  Relevant CV Studies: Echo TEE 06/11/2023  1. Left ventricular ejection fraction, by estimation, is 55 to 60%. The  left ventricle has normal function. The left ventricle has no regional  wall motion abnormalities.   2. Right ventricular systolic function is mildly reduced.  The right  ventricular size is normal.   3. Left atrial size was severely dilated. No left atrial/left atrial  appendage thrombus was detected.   4. Right atrial size was mildly dilated.   5. The mitral valve has been repaired/replaced. No evidence of mitral  valve regurgitation. Mild mitral stenosis. The mean mitral valve gradient  is 7.0 mmHg. There is a bioprosthetic valve present in the mitral  position. Echo findings are consistent with   vegetation of the posterior mitral prosthesis, with significant  thickening and a small mobile opacity coming from the posterior leaflet.   6. Tricuspid valve regurgitation is moderate.   7. The aortic valve is  normal in structure. There is mild calcification  of the aortic valve. Aortic valve regurgitation is not visualized. Aortic  valve sclerosis is present, with no evidence of aortic valve stenosis.   8. The inferior vena cava is normal in size with greater than 50%  respiratory variability, suggesting right atrial pressure of 3 mmHg.   9. Agitated saline contrast bubble study was negative, with no evidence  of any interatrial shunt.    Echo 06/10/23  1. Left ventricular ejection fraction, by estimation, is 50 to 55%. The  left ventricle has low normal function. The left ventricle has no regional  wall motion abnormalities. Left ventricular diastolic function could not  be evaluated.   2. Right ventricular systolic function is mildly reduced. The right  ventricular size is mildly enlarged.   3. Left atrial size was severely dilated.   4. Right atrial size was mildly dilated.   5. The mitral valve has been repaired/replaced. No evidence of mitral  valve regurgitation. Moderate to severe mitral stenosis. The mean mitral  valve gradient is 11.0 mmHg.   6. Tricuspid valve regurgitation is mild to moderate.   7. The aortic valve is tricuspid. There is mild thickening of the aortic  valve. Aortic valve regurgitation is not visualized. Aortic valve  sclerosis/calcification is present, without any evidence of aortic  stenosis.    Echo 10/2021 Summary   1. The left ventricular systolic function is mildly to moderately decreased,  LVEF is visually estimated at 40-45%.    2. Mitral valve replacement ( 27 mm, bioprosthetic, implantation date:  10/11/2021).   3. Mitral valve Doppler indices are consistent with normal prosthetic valve  function.   4. The right ventricle is normal in size, with low normal systolic function.    Cardiac cath 10/2021  Coronary Findings:  Left Main:  - Ostial Left Main: No significant stenosis (large caliber)  - Left Main: No significant stenosis (large caliber)    Left Anterior Descending:  - Ostial LAD: No significant stenosis (large caliber)  - Proximal LAD: No significant stenosis (large caliber)  - Mid LAD: mild irregularity up to 25% (moderate to large caliber)  - Distal LAD: No significant stenosis (small to moderate caliber)  - First diagonal: No significant stenosis (small caliber)  - Second diagonal: No significant stenosis (moderate caliber)   Left Circumflex:  - Ostial LCx: No significant stenosis (large caliber)  - Proximal LCx: No significant stenosis (large caliber)  - Mid LCx: No significant stenosis (moderate caliber)  - Distal LCx: No significant stenosis (small to moderate caliber)  - OM1: No significant stenosis (large caliber)  - OM2: No significant stenosis (small caliber)  - OM3: No significant stenosis (small caliber)  - OM4: No significant stenosis (small to moderate caliber)   Right coronary artery:  - Ostial RCA: No  significant stenosis (large caliber)  - Proximal RCA: No significant stenosis (large caliber)  - Mid RCA: mild irregularity up to 25% (large caliber)  - Distal RCA: No significant stenosis (small to moderate caliber)  - PDA: No significant stenosis (small to moderate caliber)  - RCA continuation: No significant stenosis (small caliber)  - PL1: No significant stenosis  - PL2: No significant stenosis    Left Ventriculogram:  - Left ventriculogram was not performed.   Laboratory Data:  High Sensitivity Troponin:   Recent Labs  Lab 06/10/23 0053 06/10/23 0731 06/10/23 1531 06/20/23 1400 06/20/23 1808  TROPONINIHS 331* 362* 228* 105* 107*     Chemistry Recent Labs  Lab 06/17/23 0554 06/20/23 1401 06/20/23 1808 06/21/23 0429  NA 132* 135  --  134*  K 4.2 5.1  --  5.0  CL 101 105  --  103  CO2 21* 17*  --  16*  GLUCOSE 118* 117*  --  102*  BUN 21 26*  --  31*  CREATININE 1.63* 1.53*  --  1.63*  CALCIUM 8.1* 8.6*  --  8.5*  MG  --   --  1.8  --   GFRNONAA 33* 36*  --  33*  ANIONGAP  10 13  --  15    Recent Labs  Lab 06/20/23 1401 06/21/23 0429  PROT 7.2 6.8  ALBUMIN 2.9* 2.8*  AST 102* 183*  ALT 43 71*  ALKPHOS 221* 192*  BILITOT 2.1* 2.9*   Lipids No results for input(s): "CHOL", "TRIG", "HDL", "LABVLDL", "LDLCALC", "CHOLHDL" in the last 168 hours.  Hematology Recent Labs  Lab 06/17/23 0554 06/20/23 1401 06/21/23 0429  WBC 12.5* 20.7* 23.5*  RBC 3.38* 3.45* 3.34*  HGB 8.9* 9.2* 8.8*  HCT 29.2* 31.0* 29.2*  MCV 86.4 89.9 87.4  MCH 26.3 26.7 26.3  MCHC 30.5 29.7* 30.1  RDW 17.9* 18.1* 18.3*  PLT 198 186 169   Thyroid No results for input(s): "TSH", "FREET4" in the last 168 hours.  BNP Recent Labs  Lab 06/20/23 1400  BNP 1,353.9*    DDimer No results for input(s): "DDIMER" in the last 168 hours.   Radiology/Studies:  CT Angio Chest Pulmonary Embolism (PE) W or WO Contrast Result Date: 06/20/2023 CLINICAL DATA:  Pulmonary embolism (PE) suspected, high prob EXAM: CT ANGIOGRAPHY CHEST WITH CONTRAST TECHNIQUE: Multidetector CT imaging of the chest was performed using the standard protocol during bolus administration of intravenous contrast. Multiplanar CT image reconstructions and MIPs were obtained to evaluate the vascular anatomy. RADIATION DOSE REDUCTION: This exam was performed according to the departmental dose-optimization program which includes automated exposure control, adjustment of the mA and/or kV according to patient size and/or use of iterative reconstruction technique. CONTRAST:  60mL OMNIPAQUE IOHEXOL 350 MG/ML SOLN COMPARISON:  06/10/2023 FINDINGS: Cardiovascular: Satisfactory opacification of the pulmonary arteries. Respiratory motion artifact degrades evaluation of the segmental and subsegmental pulmonary arterial branches within the lung bases. No evidence of pulmonary embolism to the lobar branch level. Dilated pulmonary trunk measuring 3.9 cm. Thoracic aorta is nonaneurysmal. Atherosclerotic calcifications of the aorta and coronary  arteries. Prior sternotomy and mitral valve replacement. Mild cardiomegaly. No pericardial effusion. Mediastinum/Nodes: Slight interval increase in size of several mildly enlarged mediastinal lymph nodes. Reference nodes include 1.3 cm precarinal node (series 4, image 47) and 1.2 cm AP window node (series 4, image 41). No axillary or hilar lymphadenopathy is seen. Thyroid gland, trachea, and esophagus within normal limits. Lungs/Pleura: Small-moderate right and small left pleural effusions with  compressive atelectasis. No pneumothorax. Upper Abdomen: Reflux of contrast into the IVC and hepatic veins. No acute abnormality. Musculoskeletal: No chest wall abnormality. No acute or significant osseous findings. Review of the MIP images confirms the above findings. IMPRESSION: 1. No evidence of pulmonary embolism to the lobar branch level. 2. Small-moderate right and small left pleural effusions with compressive atelectasis. 3. Dilated pulmonary trunk, which can be seen in the setting of pulmonary arterial hypertension. 4. Reflux of contrast into the IVC and hepatic veins, suggestive of right heart dysfunction. 5. Slight interval increase in size of several mildly enlarged mediastinal lymph nodes, likely reactive. 6. Aortic and coronary artery atherosclerosis (ICD10-I70.0). Electronically Signed   By: Duanne Guess D.O.   On: 06/20/2023 17:57   DG Chest 2 View Result Date: 06/20/2023 CLINICAL DATA:  Fatigue.  Shortness of breath. EXAM: CHEST - 2 VIEW COMPARISON:  06/16/2023. FINDINGS: Mild central pulmonary vascular congestion. There are bibasilar homogeneous opacities blunting the bilateral lateral costophrenic angles, which may represent combination of pleural effusion and atelectasis with or without consolidation. Correlate clinically. No pneumothorax. Stable cardio-mediastinal silhouette. Prosthetic mitral valve and presumed left atrial appendage occlusion device again seen. Sternotomy wires noted. No acute  osseous abnormalities. The soft tissues are within normal limits. Right-sided PICC line is seen with its tip overlying the upper portion of superior vena cava. IMPRESSION: *Mild central pulmonary vascular congestion. No frank pulmonary edema. *Bilateral pleural effusions and atelectasis with or without consolidation. Correlate clinically. Electronically Signed   By: Jules Schick M.D.   On: 06/20/2023 17:17     Assessment and Plan:   Bioprosthetic mitral valve endocarditis -Previously hospitalized with a TEE completed with vegetation found on the valve, and discharged with PICC line and continued antibiotic therapy with repeat TTE in 6 weeks -CT surgery (Dr. Leafy Ro) recommended conservative management -Outpatient follow-up with TEE TEE to be arranged -Limited study ordered with further recommendations to follow -Antibiotics per IM/ID  Acute on chronic HFpEF exacerbation -Presented with worsening shortness of breath and swelling -BNP 1353 -Vascular congestion on chest x-ray, pleural effusions -Previous echocardiogram 06/11/2023 revealed an LVEF of 55 to 60% -Repeat limited study ordered to reassess valve with further recommendations to follow -Continued on furosemide 40 mg IV twice daily -Heart failure education -Daily weights, I's and O's, low-sodium diet  Paroxysmal atrial fibrillation -Currently maintaining sinus rhythm rates in the 80s -Continued on amiodarone 200 mg daily, apixaban 5 mg twice daily for CHA2DS2-VASc score of at least 7 for stroke prophylaxis, metoprolol tartrate 50 mg twice daily -Continue with telemetry monitoring  CKD stage III -Serum creatinine 1.63 -Baseline serum creatinine 1.03 -Monitor urine output -Monitor/trend/replete electrolytes as needed -Avoid nephrotoxic agents were able -Daily BMP  Hypertension -Blood pressure 126/72 -Remain stable on current regimen -Vital signs per unit protocol  Hyperlipidemia -Continued on atorvastatin  Demand  ischemia -High-sensitivity troponin trend 105-107, flat not consistent with ACS -Chest CT negative for PE -Echocardiogram pending -Continue aspirin 81 mg daily and atorvastatin  Elevated lactic acid -Lactic acid this morning 5.0 -WBCs trending down from 20.7-23.5 this morning -Currently continued on antibiotic therapy -Not given IV resuscitation due to heart failure exacerbation -Continued management per IM  Risk Assessment/Risk Scores:        New York Heart Association (NYHA) Functional Class NYHA Class III  CHA2DS2-VASc Score = 7   This indicates a 11.2% annual risk of stroke. The patient's score is based upon: CHF History: 1 HTN History: 1 Diabetes History: 0 Stroke History: 2 Vascular  Disease History: 1 Age Score: 1 Gender Score: 1         For questions or updates, please contact [facility] Please consult [email or web address] for contact info under    Signed, Aleksandar Duve, NP  06/21/2023 8:11 AM

## 2023-06-21 NOTE — Plan of Care (Signed)
  Problem: Education: Goal: Knowledge of General Education information will improve Description: Including pain rating scale, medication(s)/side effects and non-pharmacologic comfort measures Outcome: Progressing   Problem: Health Behavior/Discharge Planning: Goal: Ability to manage health-related needs will improve Outcome: Progressing   Problem: Clinical Measurements: Goal: Ability to maintain clinical measurements within normal limits will improve Outcome: Progressing Goal: Will remain free from infection Outcome: Progressing Goal: Diagnostic test results will improve Outcome: Progressing Goal: Cardiovascular complication will be avoided Outcome: Progressing   Problem: Activity: Goal: Risk for activity intolerance will decrease Outcome: Progressing   Problem: Elimination: Goal: Will not experience complications related to urinary retention Outcome: Progressing

## 2023-06-21 NOTE — Assessment & Plan Note (Signed)
Trended in the right direction.

## 2023-06-21 NOTE — Hospital Course (Signed)
Hospital course / significant events:   HPI: 73 y.o. female with medical history significant of A-fib on Eliquis, hypertension, CKD stage IIIa, prior history of stroke, dCHF, hypertension, HLD, depression with anxiety, s/p bioprosthetic AVR in 10/2021, recent admission due to endocarditis on Rocephin via PICC line, lumbar radiculopathy, who presents with shortness breath, chest pain, abdominal pain.  Patient was recently hospitalized from 2/3 - 2/11 due to mitral valve endocarditis and Gemella bacteremia. TEE 02/05 showing mobile opacity on posterior leaflet mitral valve prosthetic valve. Cardiology following, have discussed w/ CT surgery and recs for NO surgery/replacement at this time, favor plan for repeat TEE in 6 weeks to reevaluate mitral valve. Pt is discharged on 6 weeks of IV Rocephin.  PICC line is placed on 2/10.   Pt states that she started having shortness of breath since this morning, which has been progressively worsening.  Patient has cough with clear mucus production.  Also reports left-sided chest pain, no fever or chills.  She has diffused abdominal pain, which is constant, aching, moderate, nonradiating.  She has nausea, dry heaves.  She states that she has some diarrhea recently, which has resolved.  Today no diarrhea.  Denies symptoms of UTI.  She complains of lower back pain.  Of note, patient had MRI spine L and T spine which had no evidence osteo/discitis.   02/14: pt was found to have BNP 1353, WBC 20.7 (12.5 on 06/17/2023), negative PCR for COVID, flu and RSV, lactic acid 4.8 --> 3.8, temperature normal, blood pressure 118/66, heart rate 90, RR 28, worsening renal function, liver function (ALP 221, AST 102, ALT 43, total bilirubin 2.1.  Patient is admitted to telemetry bed as inpatient. 02/15.  Patient still complains of pain between the shoulder blades.  Added a muscle relaxant and changed her pain medications over to tramadol.  1 dose of morphine given for severe pain. Echo today  - EF 55-60%, normal LV fxn, mod reduced RV fxn, no new valve abn, persistent mobile opacity on mitral valve c/w known vegetation  02/16.  Patient complaining severe pain in her right foot and right first toe.  Likely gout secondary to diuresis.  Platelet count 111. 02/17.  Right foot pain much better.  Continue Solu-Medrol.  Cardiology decreased Lasix to daily dosing.  Platelet count 77 02/18.  Right foot pain continues to improve.  Continue Solu-Medrol today and switch over to prednisone for tomorrow.  Continue daily IV Lasix for heart failure.  Continue to watch platelets.  Platelet count 65 02/19: on po lasix and improving.  02/20: per cardiology, ok to dc on po lasix. Confirmed IV abx. Daughter can get her tomorrow - will repeat labs in AM and if still trending appropriately anticipate discharge  02/21: stable for discharge home w/ home health abx and close PCP/cardiology follow up     Consultants:  Cardiology Infectious Disease   Procedures/Surgeries: none      ASSESSMENT & PLAN:   Acute on chronic diastolic CHF (congestive heart failure) (HCC) Consistent with right heart failure and pulmonary hypertension.  Echocardiogram showed decreased right ventricular function and normal left ventricular ejection fraction.   Continue po Lasix daily 40 mg.   Jardiance.   Cardiology following.   Acute gout Right first toe and right foot secondary to diuresis and kidney impairment.  Pain much better today.   Solu-Medrol changed to prednisone today Continue colchicine.   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  CT scan did not show pneumonia.  Blood cultures negative for 4 days.  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   ID changed antibiotics back to Rocephin on 2/17.    White blood cell count slowly trending better at 17.6 but patient now on steroids. On IV ceftriaxone for 6 weeks until 07/22/23 MRI thoracic and lumbar spine neg last admission - May need MRI with contrast if pain persist and leucocytosis persist  Pleural effusion Likely with right-sided heart failure Repeat imaging if deteriorating respiratory status    Elevated lactic acid level Trended in the right direction. Monitor as needed   Acute kidney injury superimposed on CKD3b - improving  Patient has baseline creatinine around 1.02.  Had acute kidney injury on last hospitalization.  Creatinine peaked at 1.89.  Creatinine down to 1.4 Trend BMP   Chronic a-fib (HCC) On Eliquis for anticoagulation and metoprolol for rate control. if platelets drop below 50,000, would favor discontinuation of apixaban    Hyperlipidemia atorvastatin held d/t abnormal LFT   Essential hypertension Continue Lasix and metoprolol   History of CVA (cerebrovascular accident) On aspirin  Held atorvastatin d/t abnormal LFT    Myocardial injury Demand ischemia from CHF To ED for chest pain    Abnormal LFTs Could be secondary to infection.   Right upper quadrant shows gallstones but no evidence of cholecystitis. Biliary sludge due to ceftriaxone causing this picture is remotely possible but very unlikely   Hold Lipitor. Trend CMP Consider MRCP but levels are improving, follow outpatient    OSA (obstructive sleep apnea) CPAP at night   Depression with anxiety Continue Zoloft   Thrombocytopenia  Question secondary to infection and/or antibiotics.   Protonix discontinued and switched over to Carafate.   Continue to monitor CBC if platelets drop below 50,000, would favor discontinuation of apixaban    Iron deficiency anemia Hemoglobin 8.8 and ferritin 67. Monitor CBC   Hyponatremia Last sodium normal range Monitor BMP         Class 2 obesity based on BMI: Body mass index is 37.75 kg/m.   Underweight - under 18  overweight - 25 to 29 obese - 30 or more Class 1 obesity: BMI of 30.0 to 34 Class 2 obesity: BMI of 35.0 to 39 Class 3 obesity: BMI of 40.0 to 49 Super Morbid Obesity: BMI 50-59 Super-super Morbid Obesity: BMI 60+ Significantly low or high BMI is associated with higher medical risk.  Weight management advised as adjunct to other disease management and risk reduction treatments    DVT prophylaxis: Eliquis but if platelets drop below 50,000, would favor discontinuation IV fluids: no continuous IV fluids  Nutrition: cardiac diet  Central lines / invasive devices: PICC line   Code Status: FULL CODE ACP documentation reviewed:  none on file in [facility]  TOC needs: home health Barriers to dispo / significant pending items: cardiology clearance prior to d/c - possible can go home tomorrow if continues to do well on po diuretics

## 2023-06-21 NOTE — Assessment & Plan Note (Signed)
On Eliquis for anticoagulation and metoprolol for rate control.

## 2023-06-21 NOTE — Assessment & Plan Note (Signed)
 Continue Zoloft

## 2023-06-21 NOTE — Assessment & Plan Note (Signed)
Hemoglobin 8.8.  Check a ferritin tomorrow to classify anemia

## 2023-06-21 NOTE — Assessment & Plan Note (Signed)
 Last sodium normal range

## 2023-06-21 NOTE — Assessment & Plan Note (Signed)
Demand ischemia from CHF ?

## 2023-06-21 NOTE — ED Notes (Signed)
This NT changed pt. Pt stated that she was wet. Pt has on a clean purewick, a clean brief and chux. Pt is resting comfortably.

## 2023-06-21 NOTE — Assessment & Plan Note (Signed)
With elevation of white blood cell count, became more aggressive with antibiotics with vancomycin and cefepime.  CT scan did not show pneumonia.  Will wait for blood cultures to be negative for 2 days and then hopefully switch back to Rocephin for Gemella endocarditis.

## 2023-06-21 NOTE — Assessment & Plan Note (Addendum)
Consistent with right heart failure.  Echocardiogram showed decreased right ventricular function and normal left ventricular ejection fraction.  Continue Lasix twice daily 40 mg.  Cardiology following.

## 2023-06-21 NOTE — Progress Notes (Signed)
*  PRELIMINARY RESULTS* Echocardiogram 2D Echocardiogram has been performed.  Tina Peters Tina Peters 06/21/2023, 11:02 AM

## 2023-06-21 NOTE — Assessment & Plan Note (Signed)
 On aspirin and atorvastatin ?

## 2023-06-21 NOTE — Progress Notes (Signed)
Progress Note   Patient: Tina Peters:[id number] DOB: 1950-05-29 DOA: 06/20/2023     1 DOS: the patient was seen and examined on 06/21/2023   Brief hospital course: 73 y.o. female with medical history significant of A-fib on Eliquis, hypertension, CKD stage IIIa, prior history of stroke, dCHF, hypertension, HLD, depression with anxiety, s/p bioprosthetic AVR in 10/2021, recent admission due to endocarditis on Rocephin via PICC line, lumbar radiculopathy, who presents with shortness breath, chest pain, abdominal pain.   Patient was recently hospitalized from 2/3 - 2/11 due to mitral valve endocarditis and Gemella bacteremia. TEE 02/05 showing mobile opacity on posterior leaflet mitral valve prosthetic valve. Cardiology following, have discussed w/ CT surgery and recs for NO surgery/replacement at this time, favor plan for repeat TEE in 6 weeks to reevaluate mitral valve. Pt is discharged on 6 weeks of IV Rocephin.  PICC line is placed on 2/10.   Pt states that she started having shortness of breath since this morning, which has been progressively worsening.  Patient has cough with clear mucus production.  Also reports left-sided chest pain, no fever or chills.  She has diffused abdominal pain, which is constant, aching, moderate, nonradiating.  She has nausea, dry heaves.  She states that she has some diarrhea recently, which has resolved.  Today no diarrhea.  Denies symptoms of UTI.  She complains of lower back pain.  Of note, patient had MRI spine L and T spine which had no evidence osteo/discitis.    Data reviewed independently and ED Course: pt was found to have BNP 1353, WBC 20.7 (12.5 on 06/17/2023), negative PCR for COVID, flu and RSV, lactic acid 4.8 --> 3.8, temperature normal, blood pressure 118/66, heart rate 90, RR 28, worsening renal function, liver function (ALP 221, AST 102, ALT 43, total bilirubin 2.1.  Patient is admitted to telemetry bed as inpatient.  2/15.  Patient still  complains of pain between the shoulder blades.  Added a muscle relaxant and changed her pain medications over to tramadol.  1 dose of morphine given for severe pain.  Assessment and Plan: * Acute on chronic diastolic CHF (congestive heart failure) (HCC) Consistent with right heart failure.  Echocardiogram showed decreased right ventricular function and normal left ventricular ejection fraction.  Continue Lasix twice daily 40 mg.  Cardiology following.  Endocarditis of mitral valve With elevation of white blood cell count, became more aggressive with antibiotics with vancomycin and cefepime.  CT scan did not show pneumonia.  Will wait for blood cultures to be negative for 2 days and then hopefully switch back to Rocephin for Gemella endocarditis.  Pleural effusion Likely with right-sided heart failure  Elevated lactic acid level Cannot give IV fluids with right-sided heart failure  Acute kidney injury superimposed on CKD [facility]) Patient has baseline creatinine around 1.02.  Had acute kidney injury on last hospitalization.  Creatinine is still impaired at 1.63 and not back to her baseline yet.  Chronic a-fib (HCC) On Eliquis for anticoagulation and metoprolol for rate control.  Hyperlipidemia On atorvastatin  Essential hypertension Continue Lasix and metoprolol  History of CVA (cerebrovascular accident) On aspirin and atorvastatin  Myocardial injury Demand ischemia from CHF  Abnormal LFTs Could be secondary to infection.  Right upper quadrant shows gallstones but no evidence of cholecystitis.  OSA (obstructive sleep apnea) CPAP at night  Depression with anxiety Continue Zoloft  Obesity, Class II, BMI 35-39.9 BMI 37.42 with current height and weight in computer.  Hyponatremia Sodium 1 point less  than the normal range  Anemia, unspecified Hemoglobin 8.8.  Check a ferritin tomorrow to classify anemia        Subjective: Patient complains of pain between her shoulder  blades.  Reviewed previous MRI did not show any sign of infection there.  Added muscle relaxer and changed to pain medication.  Physical Exam: Vitals:   06/21/23 1300 06/21/23 1356 06/21/23 1400 06/21/23 1432  BP: (!) 102/55  104/72   Pulse: 87  85   Resp: 19  (!) 23   Temp:  97.9 F (36.6 C) 97.7 F (36.5 C)   TempSrc:  Oral Oral   SpO2: 100%  100%   Height:    5\' 5"  (1.651 m)   Physical Exam HENT:     Head: Normocephalic.  Eyes:     General: Lids are normal.     Conjunctiva/sclera: Conjunctivae normal.  Cardiovascular:     Rate and Rhythm: Normal rate. Rhythm irregularly irregular.     Heart sounds: Normal heart sounds, S1 normal and S2 normal.  Pulmonary:     Breath sounds: Examination of the right-lower field reveals decreased breath sounds and rales. Examination of the left-lower field reveals decreased breath sounds and rales. Decreased breath sounds and rales present. No wheezing or rhonchi.  Abdominal:     Palpations: Abdomen is soft.     Tenderness: There is no abdominal tenderness.  Musculoskeletal:     Right lower leg: Swelling present.     Left lower leg: Swelling present.  Skin:    General: Skin is warm.     Findings: No rash.  Neurological:     Mental Status: She is alert and oriented to person, place, and time.     Data Reviewed: Left ventricular EF 55 to 60% right ventricular systolic function moderately reduced and right ventricular size is moderately enlarged, mild mitral valve regurgitation with a small mobile opacity again noted in the leaflets concerning for valve vegetation. Sodium 134, potassium 5.0, creatinine 1.63, BUN 31, alkaline phosphatase 192, AST 183, ALT 71, total bilirubin 2.9, lactic acid 5.0, hemoglobin 8.8, platelets 169, white blood cell count 23.5 CT angio of the chest does not show any evidence of pulmonary embolism small to moderate right pleural effusion and small left pleural effusion, dilated pulmonary trunk, reflux of contrast into  the IVC and hepatic veins suggestions of right heart dysfunction  Family Communication: Updated daughter on the phone  Disposition: Status is: Inpatient Remains inpatient appropriate because: Treating for right heart failure with endocarditis  Planned Discharge Destination: To be determined    Time spent: 30 minutes Case discussed with cardiology.  Author: Alford Highland, MD 06/21/2023 3:42 PM  For on call review [email or web address].

## 2023-06-21 NOTE — Assessment & Plan Note (Signed)
Likely with right-sided heart failure

## 2023-06-21 NOTE — ED Notes (Signed)
MD made aware lactic 5.0.

## 2023-06-21 NOTE — Assessment & Plan Note (Signed)
BMI 37.42 with current height and weight in computer.

## 2023-06-21 NOTE — ED Notes (Signed)
Manuela Schwartz, NP was informed of Lactic Acid 5.3. Order was received to continue with order Lactic Acid draws and to report if bp decreases below map of 65.

## 2023-06-22 DIAGNOSIS — M109 Gout, unspecified: Secondary | ICD-10-CM

## 2023-06-22 DIAGNOSIS — J90 Pleural effusion, not elsewhere classified: Secondary | ICD-10-CM | POA: Diagnosis not present

## 2023-06-22 DIAGNOSIS — R1010 Upper abdominal pain, unspecified: Secondary | ICD-10-CM

## 2023-06-22 DIAGNOSIS — N1832 Chronic kidney disease, stage 3b: Secondary | ICD-10-CM

## 2023-06-22 DIAGNOSIS — D509 Iron deficiency anemia, unspecified: Secondary | ICD-10-CM | POA: Insufficient documentation

## 2023-06-22 DIAGNOSIS — M10071 Idiopathic gout, right ankle and foot: Secondary | ICD-10-CM

## 2023-06-22 DIAGNOSIS — I272 Pulmonary hypertension, unspecified: Secondary | ICD-10-CM | POA: Diagnosis not present

## 2023-06-22 DIAGNOSIS — I059 Rheumatic mitral valve disease, unspecified: Secondary | ICD-10-CM | POA: Diagnosis not present

## 2023-06-22 DIAGNOSIS — E871 Hypo-osmolality and hyponatremia: Secondary | ICD-10-CM

## 2023-06-22 DIAGNOSIS — D508 Other iron deficiency anemias: Secondary | ICD-10-CM

## 2023-06-22 DIAGNOSIS — I5033 Acute on chronic diastolic (congestive) heart failure: Secondary | ICD-10-CM | POA: Diagnosis not present

## 2023-06-22 DIAGNOSIS — I50813 Acute on chronic right heart failure: Secondary | ICD-10-CM | POA: Diagnosis not present

## 2023-06-22 DIAGNOSIS — E66813 Obesity, class 3: Secondary | ICD-10-CM | POA: Insufficient documentation

## 2023-06-22 DIAGNOSIS — I483 Typical atrial flutter: Secondary | ICD-10-CM | POA: Diagnosis not present

## 2023-06-22 LAB — SUSCEPTIBILITY RESULT

## 2023-06-22 LAB — BASIC METABOLIC PANEL
Anion gap: 11 (ref 5–15)
BUN: 40 mg/dL — ABNORMAL HIGH (ref 8–23)
CO2: 23 mmol/L (ref 22–32)
Calcium: 8.2 mg/dL — ABNORMAL LOW (ref 8.9–10.3)
Chloride: 100 mmol/L (ref 98–111)
Creatinine, Ser: 1.89 mg/dL — ABNORMAL HIGH (ref 0.44–1.00)
GFR, Estimated: 28 mL/min — ABNORMAL LOW (ref >60)
Glucose, Bld: 116 mg/dL — ABNORMAL HIGH (ref 70–99)
Potassium: 4.2 mmol/L (ref 3.5–5.1)
Sodium: 134 mmol/L — ABNORMAL LOW (ref 135–145)

## 2023-06-22 LAB — LACTIC ACID, PLASMA: Lactic Acid, Venous: 2.4 mmol/L (ref 0.5–1.9)

## 2023-06-22 LAB — CBC
HCT: 26.9 % — ABNORMAL LOW (ref 36.0–46.0)
Hemoglobin: 8.5 g/dL — ABNORMAL LOW (ref 12.0–15.0)
MCH: 26.5 pg (ref 26.0–34.0)
MCHC: 31.6 g/dL (ref 30.0–36.0)
MCV: 83.8 fL (ref 80.0–100.0)
Platelets: 111 10*3/uL — ABNORMAL LOW (ref 150–400)
RBC: 3.21 MIL/uL — ABNORMAL LOW (ref 3.87–5.11)
RDW: 18.1 % — ABNORMAL HIGH (ref 11.5–15.5)
WBC: 19 10*3/uL — ABNORMAL HIGH (ref 4.0–10.5)
nRBC: 1.8 % — ABNORMAL HIGH (ref 0.0–0.2)

## 2023-06-22 LAB — FERRITIN: Ferritin: 67 ng/mL (ref 11–307)

## 2023-06-22 LAB — SUSCEPTIBILITY, AER + ANAEROB

## 2023-06-22 LAB — VANCOMYCIN, RANDOM
Vancomycin Rm: 28 ug/mL
Vancomycin Rm: 18 ug/mL

## 2023-06-22 LAB — URIC ACID: Uric Acid, Serum: 9.6 mg/dL — ABNORMAL HIGH (ref 2.5–7.1)

## 2023-06-22 MED ORDER — COLCHICINE 0.6 MG PO TABS
0.6000 mg | ORAL_TABLET | ORAL | Status: AC
  Administered 2023-06-22: 0.6 mg via ORAL
  Filled 2023-06-22: qty 1

## 2023-06-22 MED ORDER — METHYLPREDNISOLONE SODIUM SUCC 40 MG IJ SOLR
40.0000 mg | Freq: Every day | INTRAMUSCULAR | Status: DC
  Administered 2023-06-22 – 2023-06-24 (×3): 40 mg via INTRAVENOUS
  Filled 2023-06-22 (×3): qty 1

## 2023-06-22 MED ORDER — FUROSEMIDE 10 MG/ML IJ SOLN
40.0000 mg | Freq: Every day | INTRAMUSCULAR | Status: DC
  Administered 2023-06-23 – 2023-06-25 (×3): 40 mg via INTRAVENOUS
  Filled 2023-06-22 (×3): qty 4

## 2023-06-22 NOTE — Progress Notes (Signed)
Progress Note   Patient: Tina Peters ZDG:[id number] DOB: [birth year] DOA: 06/20/2023     2 DOS: the patient was seen and examined on 06/22/2023   Brief hospital course: 73 y.o. female with medical history significant of A-fib on Eliquis, hypertension, CKD stage IIIa, prior history of stroke, dCHF, hypertension, HLD, depression with anxiety, s/p bioprosthetic AVR in 10/2021, recent admission due to endocarditis on Rocephin via PICC line, lumbar radiculopathy, who presents with shortness breath, chest pain, abdominal pain.   Patient was recently hospitalized from 2/3 - 2/11 due to mitral valve endocarditis and Gemella bacteremia. TEE 02/05 showing mobile opacity on posterior leaflet mitral valve prosthetic valve. Cardiology following, have discussed w/ CT surgery and recs for NO surgery/replacement at this time, favor plan for repeat TEE in 6 weeks to reevaluate mitral valve. Pt is discharged on 6 weeks of IV Rocephin.  PICC line is placed on 2/10.   Pt states that she started having shortness of breath since this morning, which has been progressively worsening.  Patient has cough with clear mucus production.  Also reports left-sided chest pain, no fever or chills.  She has diffused abdominal pain, which is constant, aching, moderate, nonradiating.  She has nausea, dry heaves.  She states that she has some diarrhea recently, which has resolved.  Today no diarrhea.  Denies symptoms of UTI.  She complains of lower back pain.  Of note, patient had MRI spine L and T spine which had no evidence osteo/discitis.    Data reviewed independently and ED Course: pt was found to have BNP 1353, WBC 20.7 (12.5 on 06/17/2023), negative PCR for COVID, flu and RSV, lactic acid 4.8 --> 3.8, temperature normal, blood pressure 118/66, heart rate 90, RR 28, worsening renal function, liver function (ALP 221, AST 102, ALT 43, total bilirubin 2.1.  Patient is admitted to telemetry bed as inpatient.  2/15.  Patient still  complains of pain between the shoulder blades.  Added a muscle relaxant and changed her pain medications over to tramadol.  1 dose of morphine given for severe pain. 2/16.  Patient complaining severe pain in her right foot and right first toe.  Assessment and Plan: * Acute on chronic diastolic CHF (congestive heart failure) (HCC) Consistent with right heart failure and pulmonary hypertension.  Echocardiogram showed decreased right ventricular function and normal left ventricular ejection fraction.  Continue IV Lasix twice daily 40 mg.  Cardiology following.  Acute gout Right first toe and right foot secondary to diuresis and kidney impairment.  Start Solu-Medrol and give a dose of colchicine.  Add on a uric acid.  Endocarditis of mitral valve With elevation of white blood cell count, became more aggressive with antibiotics with vancomycin and cefepime.  CT scan did not show pneumonia.  Blood cultures negative for 2 days.  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  Pleural effusion Likely with right-sided heart failure  Elevated lactic acid level Cannot give IV fluids with right-sided heart failure.  Lactic acid down to 2.4.  Acute kidney injury superimposed on CKD [facility]) Patient has baseline creatinine around 1.02.  Had acute kidney injury on last hospitalization.  Creatinine is still impaired at 1.89 and not back to her baseline yet.  Chronic a-fib (HCC) On Eliquis for anticoagulation and metoprolol for rate control.  Hyperlipidemia On atorvastatin  Essential hypertension Continue Lasix and metoprolol  History of CVA (cerebrovascular accident) On aspirin and atorvastatin  Myocardial injury Demand ischemia from CHF  Abnormal LFTs Could be secondary to infection.  Right upper quadrant shows gallstones but no evidence of cholecystitis.  OSA (obstructive sleep apnea) CPAP at night  Depression with anxiety Continue Zoloft  Iron deficiency anemia Hemoglobin 8.5 and  ferritin 67.  Obesity, Class III, BMI 40-49.9 (morbid obesity) (HCC) BMI 40.21 with current height and weight in computer  Hyponatremia Sodium 1 point less than the normal range        Subjective: When I walked in the room she was telling the nurse about severe pain in her right foot.  Pain is in the top of her foot and first toe.  Likely gout with IV diuretic therapy.  Will give a dose of steroid and colchicine and add on a uric acid.  Patient feels like she is breathing a little bit better.  Not having as much pain in her back.  Admitted with CHF exacerbation.  Physical Exam: Vitals:   06/21/23 2046 06/22/23 0017 06/22/23 0308 06/22/23 0856  BP: 103/89 105/65 104/62 125/69  Pulse: 91 92 91 92  Resp: 20   18  Temp: 98 F (36.7 C) 98.2 F (36.8 C) (!) 97.5 F (36.4 C) 98.1 F (36.7 C)  TempSrc: Oral Oral Oral   SpO2: 100% 100% 96% 98%  Weight:      Height:       Physical Exam HENT:     Head: Normocephalic.  Eyes:     General: Lids are normal.     Conjunctiva/sclera: Conjunctivae normal.  Cardiovascular:     Rate and Rhythm: Normal rate. Rhythm irregularly irregular.     Heart sounds: Normal heart sounds, S1 normal and S2 normal.  Pulmonary:     Breath sounds: Examination of the right-lower field reveals decreased breath sounds and rales. Examination of the left-lower field reveals decreased breath sounds and rales. Decreased breath sounds and rales present. No wheezing or rhonchi.  Abdominal:     Palpations: Abdomen is soft.     Tenderness: There is no abdominal tenderness.  Musculoskeletal:     Right lower leg: Swelling present.     Left lower leg: Swelling present.     Comments: Pain right first toe and top of foot.  Skin:    General: Skin is warm.     Findings: No rash.  Neurological:     Mental Status: She is alert and oriented to person, place, and time.     Data Reviewed: Blood cultures negative for 2 days, MRSA PCR negative Sodium 134, creatinine 1.89,  lactic acid 2.4, ferritin 67, hemoglobin 8.5, platelet count 111, white blood cell count 19  Family Communication: Left message for daughter  Disposition: Status is: Inpatient Remains inpatient appropriate because: Diuresing from right-sided heart failure.  Treating acute gout with IV steroid and colchicine.  Planned Discharge Destination: To be determined    Time spent: 28 minutes  Author: Alford Highland, MD 06/22/2023 12:32 PM  For on call review [email or web address].

## 2023-06-22 NOTE — Assessment & Plan Note (Signed)
Hemoglobin 8.8 and ferritin 67.

## 2023-06-22 NOTE — Assessment & Plan Note (Signed)
BMI 40.21 with current height and weight in computer

## 2023-06-22 NOTE — Plan of Care (Signed)
  Problem: Education: Goal: Knowledge of General Education information will improve Description: Including pain rating scale, medication(s)/side effects and non-pharmacologic comfort measures Outcome: Progressing   Problem: Clinical Measurements: Goal: Ability to maintain clinical measurements within normal limits will improve Outcome: Progressing Goal: Will remain free from infection Outcome: Progressing Goal: Diagnostic test results will improve Outcome: Progressing Goal: Cardiovascular complication will be avoided Outcome: Progressing   Problem: Activity: Goal: Risk for activity intolerance will decrease Outcome: Progressing   Problem: Elimination: Goal: Will not experience complications related to urinary retention Outcome: Progressing

## 2023-06-22 NOTE — Progress Notes (Signed)
Rounding Note    Patient Name: Tina Peters Date of Encounter: 06/22/2023  [facility] Cardiologist: Lorine Bears, MD   Subjective   Patient seen on AM rounds. Denies any chest pain. Shortness of breath is improving. -2.4 L output in the last 24 hours. Daughter remains at the bedside with questions answered.   Inpatient Medications    Scheduled Meds:  acidophilus  1 capsule Oral BID   apixaban  5 mg Oral BID   aspirin EC  81 mg Oral Daily   atorvastatin  40 mg Oral QHS   Chlorhexidine Gluconate Cloth  6 each Topical Daily   furosemide  40 mg Intravenous Q12H   methylPREDNISolone (SOLU-MEDROL) injection  40 mg Intravenous Daily   metoprolol tartrate  50 mg Oral BID   pantoprazole  40 mg Oral Daily   sertraline  50 mg Oral Daily   sodium chloride flush  10-40 mL Intracatheter Q12H   vancomycin variable dose per unstable renal function (pharmacist dosing)   Does not apply See admin instructions   Continuous Infusions:  ceFEPime (MAXIPIME) IV 2 g (06/22/23 0623)   PRN Meds: acetaminophen, albuterol, dextromethorphan-guaiFENesin, diphenhydrAMINE, hydrALAZINE, methocarbamol, nitroGLYCERIN, ondansetron (ZOFRAN) IV, sodium chloride flush, traMADol   Vital Signs    Vitals:   06/21/23 2046 06/22/23 0017 06/22/23 0308 06/22/23 0856  BP: 103/89 105/65 104/62 125/69  Pulse: 91 92 91 92  Resp: 20   18  Temp: 98 F (36.7 C) 98.2 F (36.8 C) (!) 97.5 F (36.4 C) 98.1 F (36.7 C)  TempSrc: Oral Oral Oral   SpO2: 100% 100% 96% 98%  Weight:      Height:        Intake/Output Summary (Last 24 hours) at 06/22/2023 0945 Last data filed at 06/22/2023 0943 Gross per 24 hour  Intake 92.1 ml  Output 2500 ml  Net -2407.9 ml      06/21/2023    5:18 PM 06/11/2023   12:05 PM 06/09/2023    9:35 PM  Last 3 Weights  Weight (lbs) 241 lb 10 oz 224 lb 13.9 oz 224 lb 13.9 oz  Weight (kg) 109.6 kg 102 kg 102 kg      Telemetry    Atrial fibrillation rates 80's - Personally  Reviewed  ECG    No new tracings - Personally Reviewed  Physical Exam   GEN: No acute distress.   Neck: unable to determine JVD due to body habitus Cardiac: IR IR, no murmurs, rubs, or gallops.  Respiratory: Clear upper with crackles noted in the bases with to auscultation bilaterally.Respirations are unlabored at rest on room air GI: Soft, nontender, obese, non-distended  MS: 1+ edema; No deformity.Inflammation to the right foot and ankle questionable gout Neuro:  Nonfocal  Psych: Normal affect   Labs    High Sensitivity Troponin:   Recent Labs  Lab 06/10/23 0053 06/10/23 0731 06/10/23 1531 06/20/23 1400 06/20/23 1808  TROPONINIHS 331* 362* 228* 105* 107*     Chemistry Recent Labs  Lab 06/20/23 1401 06/20/23 1808 06/21/23 0429 06/22/23 0440  NA 135  --  134* 134*  K 5.1  --  5.0 4.2  CL 105  --  103 100  CO2 17*  --  16* 23  GLUCOSE 117*  --  102* 116*  BUN 26*  --  31* 40*  CREATININE 1.53*  --  1.63* 1.89*  CALCIUM 8.6*  --  8.5* 8.2*  MG  --  1.8  --   --  PROT 7.2  --  6.8  --   ALBUMIN 2.9*  --  2.8*  --   AST 102*  --  183*  --   ALT 43  --  71*  --   ALKPHOS 221*  --  192*  --   BILITOT 2.1*  --  2.9*  --   GFRNONAA 36*  --  33* 28*  ANIONGAP 13  --  15 11    Lipids No results for input(s): "CHOL", "TRIG", "HDL", "LABVLDL", "LDLCALC", "CHOLHDL" in the last 168 hours.  Hematology Recent Labs  Lab 06/20/23 1401 06/21/23 0429 06/22/23 0524  WBC 20.7* 23.5* 19.0*  RBC 3.45* 3.34* 3.21*  HGB 9.2* 8.8* 8.5*  HCT 31.0* 29.2* 26.9*  MCV 89.9 87.4 83.8  MCH 26.7 26.3 26.5  MCHC 29.7* 30.1 31.6  RDW 18.1* 18.3* 18.1*  PLT 186 169 111*   Thyroid No results for input(s): "TSH", "FREET4" in the last 168 hours.  BNP Recent Labs  Lab 06/20/23 1400  BNP 1,353.9*    DDimer No results for input(s): "DDIMER" in the last 168 hours.   Radiology    ECHOCARDIOGRAM LIMITED Result Date: 06/21/2023    ECHOCARDIOGRAM LIMITED REPORT   Patient Name:    [name] Date of Exam: 06/21/2023 Medical Rec #:  [id number]       Height:       65.0 in Accession #:    [id number]      Weight:       224.9 lb Date of Birth:  Sep 16, 1950       BSA:          2.079 m Patient Age:    73 years        BP:           126/76 mmHg Patient Gender: F               HR:           89 bpm. Exam Location:  [facility] Procedure: Limited Echo, Limited Color Doppler and Cardiac Doppler (Both            Spectral and Color Flow Doppler were utilized during procedure). Indications:     MV disease; Endocarditis  History:         Patient has prior history of Echocardiogram examinations, most                  recent 06/10/2023. CHF, Stroke, Mitral Valve Disease,                  Arrythmias:Atrial Fibrillation; Risk Factors:Dyslipidemia,                  Sleep Apnea and Former Smoker. S/P Bovine mitral valve                  replacement.  Sonographer:     Dondra Prader RVT RCS Referring Phys:  [id number] Antonieta Iba Diagnosing Phys: Julien Nordmann MD  Sonographer Comments: Patient is obese. Image acquisition challenging due to patient body habitus. Patient unable to tolerate exam. IMPRESSIONS  1. Left ventricular ejection fraction, by estimation, is 55 to 60%. The left ventricle has normal function. The left ventricle has no regional wall motion abnormalities.  2. Right ventricular systolic function is moderately reduced. The right ventricular size is moderately enlarged. Tricuspid regurgitation signal is inadequate for assessing PA pressure.  3. Mitral valve mean gradient not measured.. The mitral valve has been repaired/replaced. Mild mitral  valve regurgitation. Small mobile opacity again noted on leaflets concerning for valve vegetation.  4. The aortic valve is normal in structure. There is mild calcification of the aortic valve. Aortic valve regurgitation is not visualized. Aortic valve sclerosis is present, with no evidence of aortic valve stenosis. FINDINGS  Left Ventricle: Left ventricular ejection  fraction, by estimation, is 55 to 60%. The left ventricle has normal function. The left ventricle has no regional wall motion abnormalities. The left ventricular internal cavity size was normal in size. There is  no left ventricular hypertrophy. Right Ventricle: The right ventricular size is moderately enlarged. No increase in right ventricular wall thickness. Right ventricular systolic function is moderately reduced. Tricuspid regurgitation signal is inadequate for assessing PA pressure. Left Atrium: Left atrial size was normal in size. Right Atrium: Right atrial size was normal in size. Pericardium: There is no evidence of pericardial effusion. Mitral Valve: Mitral valve mean gradient not measured. The mitral valve has been repaired/replaced. There is severe thickening of the mitral valve leaflet(s). Mild mitral valve regurgitation. There is a bioprosthetic valve present in the mitral position. Tricuspid Valve: The tricuspid valve is normal in structure. Tricuspid valve regurgitation is not demonstrated. No evidence of tricuspid stenosis. Aortic Valve: The aortic valve is normal in structure. There is mild calcification of the aortic valve. Aortic valve regurgitation is not visualized. Aortic valve sclerosis is present, with no evidence of aortic valve stenosis. Pulmonic Valve: The pulmonic valve was normal in structure. Pulmonic valve regurgitation is not visualized. No evidence of pulmonic stenosis. Aorta: The aortic root is normal in size and structure. Venous: The inferior vena cava was not well visualized. IAS/Shunts: No atrial level shunt detected by color flow Doppler. Julien Nordmann MD Electronically signed by Julien Nordmann MD Signature Date/Time: 06/21/2023/2:51:30 PM    Final    US Abdomen Limited RUQ (LIVER/GB) Result Date: 06/21/2023 CLINICAL DATA:  Abdominal pain EXAM: ULTRASOUND ABDOMEN LIMITED RIGHT UPPER QUADRANT COMPARISON:  06/10/2023 FINDINGS: Gallbladder: Cholelithiasis is again observed  with mobile shadowing gallstones measuring up to 13 mm in length. No gallbladder wall thickening. Sonographic Murphy's sign absent. No pericholecystic fluid. Common bile duct: Diameter: 0.4 cm Liver: No focal lesion identified. Within normal limits in parenchymal echogenicity. Portal vein is patent on color Doppler imaging with normal direction of blood flow towards the liver. Other: Right pleural effusion observed. IMPRESSION: 1. Cholelithiasis without findings of acute cholecystitis. 2. Right pleural effusion. Electronically Signed   By: Gaylyn Rong M.D.   On: 06/21/2023 10:06   CT Angio Chest Pulmonary Embolism (PE) W or WO Contrast Result Date: 06/20/2023 CLINICAL DATA:  Pulmonary embolism (PE) suspected, high prob EXAM: CT ANGIOGRAPHY CHEST WITH CONTRAST TECHNIQUE: Multidetector CT imaging of the chest was performed using the standard protocol during bolus administration of intravenous contrast. Multiplanar CT image reconstructions and MIPs were obtained to evaluate the vascular anatomy. RADIATION DOSE REDUCTION: This exam was performed according to the departmental dose-optimization program which includes automated exposure control, adjustment of the mA and/or kV according to patient size and/or use of iterative reconstruction technique. CONTRAST:  60mL OMNIPAQUE IOHEXOL 350 MG/ML SOLN COMPARISON:  06/10/2023 FINDINGS: Cardiovascular: Satisfactory opacification of the pulmonary arteries. Respiratory motion artifact degrades evaluation of the segmental and subsegmental pulmonary arterial branches within the lung bases. No evidence of pulmonary embolism to the lobar branch level. Dilated pulmonary trunk measuring 3.9 cm. Thoracic aorta is nonaneurysmal. Atherosclerotic calcifications of the aorta and coronary arteries. Prior sternotomy and mitral valve replacement. Mild cardiomegaly. No  pericardial effusion. Mediastinum/Nodes: Slight interval increase in size of several mildly enlarged mediastinal  lymph nodes. Reference nodes include 1.3 cm precarinal node (series 4, image 47) and 1.2 cm AP window node (series 4, image 41). No axillary or hilar lymphadenopathy is seen. Thyroid gland, trachea, and esophagus within normal limits. Lungs/Pleura: Small-moderate right and small left pleural effusions with compressive atelectasis. No pneumothorax. Upper Abdomen: Reflux of contrast into the IVC and hepatic veins. No acute abnormality. Musculoskeletal: No chest wall abnormality. No acute or significant osseous findings. Review of the MIP images confirms the above findings. IMPRESSION: 1. No evidence of pulmonary embolism to the lobar branch level. 2. Small-moderate right and small left pleural effusions with compressive atelectasis. 3. Dilated pulmonary trunk, which can be seen in the setting of pulmonary arterial hypertension. 4. Reflux of contrast into the IVC and hepatic veins, suggestive of right heart dysfunction. 5. Slight interval increase in size of several mildly enlarged mediastinal lymph nodes, likely reactive. 6. Aortic and coronary artery atherosclerosis (ICD10-I70.0). Electronically Signed   By: Duanne Guess D.O.   On: 06/20/2023 17:57   DG Chest 2 View Result Date: 06/20/2023 CLINICAL DATA:  Fatigue.  Shortness of breath. EXAM: CHEST - 2 VIEW COMPARISON:  06/16/2023. FINDINGS: Mild central pulmonary vascular congestion. There are bibasilar homogeneous opacities blunting the bilateral lateral costophrenic angles, which may represent combination of pleural effusion and atelectasis with or without consolidation. Correlate clinically. No pneumothorax. Stable cardio-mediastinal silhouette. Prosthetic mitral valve and presumed left atrial appendage occlusion device again seen. Sternotomy wires noted. No acute osseous abnormalities. The soft tissues are within normal limits. Right-sided PICC line is seen with its tip overlying the upper portion of superior vena cava. IMPRESSION: *Mild central pulmonary  vascular congestion. No frank pulmonary edema. *Bilateral pleural effusions and atelectasis with or without consolidation. Correlate clinically. Electronically Signed   By: Jules Schick M.D.   On: 06/20/2023 17:17    Cardiac Studies  Limited 2 D echo 06/21/2023  1. Left ventricular ejection fraction, by estimation, is 55 to 60%. The  left ventricle has normal function. The left ventricle has no regional  wall motion abnormalities.   2. Right ventricular systolic function is moderately reduced. The right  ventricular size is moderately enlarged. Tricuspid regurgitation signal is  inadequate for assessing PA pressure.   3. Mitral valve mean gradient not measured.. The mitral valve has been  repaired/replaced. Mild mitral valve regurgitation. Small mobile opacity  again noted on leaflets concerning for valve vegetation.   4. The aortic valve is normal in structure. There is mild calcification  of the aortic valve. Aortic valve regurgitation is not visualized. Aortic  valve sclerosis is present, with no evidence of aortic valve stenosis.   Patient Profile     73 y.o. female with a past medical history of paroxysmal atrial fibrillation/flutter, tachybradycardia syndrome, PSVT, hypertension, hyperlipidemia, stroke, obstructive sleep apnea on CPAP, CKD 3, endocarditis status post MVR, asthma, tobacco abuse, chronic back pain, and diverticulosis, who is being seen and evaluated for heart failure exacerbation.  Assessment & Plan    Acute on chronic HFpEF exacerbation -Presented with worsening shortness of breath and swelling -BNP 1353 -Vascular congestion on chest x-ray with pleural effusions -Echocardiogram reveals an LVEF of 55-60% --2.4 L output in the last 24 hours -Continue on furosemide 40 mg IV daily with bump in serum creatinine -Heart failure education -Fluid restriction -Daily weights, I's and O's, low-sodium diet  Bioprosthetic mitral valve endocarditis -Previously hospitalized  with TEE completed  with vegetation found on the valve, discharged with PICC line and continued antibiotic therapy with repeat TTE recommended in 6 weeks -CT surgery Dr. Leafy Ro recommended conservative management -Outpatient follow-up with TTE to be arranged -Limited study completed revealed an LVEF 55 to 60%, no RWMA, with a small mobile opacity again noted on the leaflets concerning for valve vegetation with mild mitral regurgitation -Antibiotics continued per IM -Recommend consultation with ID, patient is known to that service  Paroxysmal atrial fibrillation/flutter -Review of telemetry reveals she is back in atrial flutter that is rate controlled -Continued on amiodarone 200 mg daily and apixaban 5 mg twice daily for CHA2DS2-VASc of at least 7 for stroke prophylaxis as well as metoprolol tartrate 50 mg twice daily -Continued on telemetry monitoring  CKD stage III -Serum creatinine 1.89 -Baseline creatinine 1.0 -Serum creatinine on admission was 1.6 -Monitor urine output -Monitor/trend/replete electrolytes as needed -Daily BMP -Avoid nephrotoxic agents were able  Hypertension -Blood pressure 125/69 -Continued on furosemide 40 mg IV twice daily and metoprolol tartrate 50 mg twice daily -Vital signs per unit protocol  Hyperlipidemia -Continued on atorvastatin  Demand ischemia -High-sensitivity troponin trended 105/27, flat not consistent with ACS -Chest CT negative for PE -Echocardiogram revealed an LVEF of 55-60%, no RWMA -Continued on aspirin 81 mg daily and atorvastatin -EKG as needed for pain or changes -No plan for ischemic workup  Elevated lactic acid -Lactic acid continues to be elevated at 2.4 this morning -WBCs trending to 19 this morning -Currently continued on antibiotic therapy -Not given IV fluid resuscitation due to heart failure exacerbation -Ongoing management per IM     For questions or updates, please contact [facility] Please consult  [email or web address] for contact info under        Signed, Gianny Sabino, NP  06/22/2023, 9:45 AM

## 2023-06-22 NOTE — Assessment & Plan Note (Signed)
Right first toe and right foot secondary to diuresis and kidney impairment.  Start Solu-Medrol and give a dose of colchicine.  Add on a uric acid.

## 2023-06-23 ENCOUNTER — Ambulatory Visit: Payer: Medicare HMO | Admitting: Family Medicine

## 2023-06-23 ENCOUNTER — Encounter: Payer: Self-pay | Admitting: Internal Medicine

## 2023-06-23 DIAGNOSIS — M1000 Idiopathic gout, unspecified site: Secondary | ICD-10-CM | POA: Diagnosis not present

## 2023-06-23 DIAGNOSIS — I058 Other rheumatic mitral valve diseases: Secondary | ICD-10-CM

## 2023-06-23 DIAGNOSIS — R7881 Bacteremia: Secondary | ICD-10-CM

## 2023-06-23 DIAGNOSIS — R7989 Other specified abnormal findings of blood chemistry: Secondary | ICD-10-CM | POA: Diagnosis not present

## 2023-06-23 DIAGNOSIS — I059 Rheumatic mitral valve disease, unspecified: Secondary | ICD-10-CM | POA: Diagnosis not present

## 2023-06-23 DIAGNOSIS — I5033 Acute on chronic diastolic (congestive) heart failure: Secondary | ICD-10-CM | POA: Diagnosis not present

## 2023-06-23 DIAGNOSIS — B9689 Other specified bacterial agents as the cause of diseases classified elsewhere: Secondary | ICD-10-CM

## 2023-06-23 DIAGNOSIS — D72829 Elevated white blood cell count, unspecified: Secondary | ICD-10-CM | POA: Diagnosis not present

## 2023-06-23 DIAGNOSIS — I509 Heart failure, unspecified: Secondary | ICD-10-CM

## 2023-06-23 DIAGNOSIS — N179 Acute kidney failure, unspecified: Secondary | ICD-10-CM | POA: Diagnosis not present

## 2023-06-23 DIAGNOSIS — D696 Thrombocytopenia, unspecified: Secondary | ICD-10-CM | POA: Insufficient documentation

## 2023-06-23 DIAGNOSIS — Z87891 Personal history of nicotine dependence: Secondary | ICD-10-CM

## 2023-06-23 LAB — LACTATE DEHYDROGENASE: LDH: 955 U/L — ABNORMAL HIGH (ref 98–192)

## 2023-06-23 LAB — BASIC METABOLIC PANEL
Anion gap: 9 (ref 5–15)
BUN: 37 mg/dL — ABNORMAL HIGH (ref 8–23)
CO2: 23 mmol/L (ref 22–32)
Calcium: 8.2 mg/dL — ABNORMAL LOW (ref 8.9–10.3)
Chloride: 103 mmol/L (ref 98–111)
Creatinine, Ser: 1.7 mg/dL — ABNORMAL HIGH (ref 0.44–1.00)
GFR, Estimated: 32 mL/min — ABNORMAL LOW (ref >60)
Glucose, Bld: 172 mg/dL — ABNORMAL HIGH (ref 70–99)
Potassium: 4 mmol/L (ref 3.5–5.1)
Sodium: 135 mmol/L (ref 135–145)

## 2023-06-23 LAB — CBC
HCT: 28.3 % — ABNORMAL LOW (ref 36.0–46.0)
Hemoglobin: 8.8 g/dL — ABNORMAL LOW (ref 12.0–15.0)
MCH: 26.2 pg (ref 26.0–34.0)
MCHC: 31.1 g/dL (ref 30.0–36.0)
MCV: 84.2 fL (ref 80.0–100.0)
Platelets: 77 10*3/uL — ABNORMAL LOW (ref 150–400)
RBC: 3.36 MIL/uL — ABNORMAL LOW (ref 3.87–5.11)
RDW: 18.2 % — ABNORMAL HIGH (ref 11.5–15.5)
WBC: 18 10*3/uL — ABNORMAL HIGH (ref 4.0–10.5)
nRBC: 1.1 % — ABNORMAL HIGH (ref 0.0–0.2)

## 2023-06-23 LAB — LEGIONELLA PNEUMOPHILA SEROGP 1 UR AG: L. pneumophila Serogp 1 Ur Ag: NEGATIVE

## 2023-06-23 LAB — HEPATIC FUNCTION PANEL
ALT: 272 U/L — ABNORMAL HIGH (ref 0–44)
AST: 651 U/L — ABNORMAL HIGH (ref 15–41)
Albumin: 2.7 g/dL — ABNORMAL LOW (ref 3.5–5.0)
Alkaline Phosphatase: 180 U/L — ABNORMAL HIGH (ref 38–126)
Bilirubin, Direct: 0.9 mg/dL — ABNORMAL HIGH (ref 0.0–0.2)
Indirect Bilirubin: 0.9 mg/dL (ref 0.3–0.9)
Total Bilirubin: 1.8 mg/dL — ABNORMAL HIGH (ref 0.0–1.2)
Total Protein: 7.1 g/dL (ref 6.5–8.1)

## 2023-06-23 LAB — PROCALCITONIN: Procalcitonin: 0.85 ng/mL

## 2023-06-23 LAB — VANCOMYCIN, RANDOM: Vancomycin Rm: 14 ug/mL

## 2023-06-23 MED ORDER — SODIUM CHLORIDE 0.9 % IV SOLN: 2.0000 g | INTRAVENOUS | Status: DC

## 2023-06-23 MED ORDER — VANCOMYCIN HCL IN DEXTROSE 1-5 GM/200ML-% IV SOLN
1000.0000 mg | Freq: Once | INTRAVENOUS | Status: AC
  Administered 2023-06-23: 1000 mg via INTRAVENOUS
  Filled 2023-06-23: qty 200

## 2023-06-23 MED ORDER — SODIUM CHLORIDE 0.9 % IV SOLN
2.0000 g | INTRAVENOUS | Status: DC
  Administered 2023-06-23 – 2023-06-27 (×5): 2 g via INTRAVENOUS
  Filled 2023-06-23 (×5): qty 20

## 2023-06-23 MED ORDER — EMPAGLIFLOZIN 10 MG PO TABS
10.0000 mg | ORAL_TABLET | Freq: Every day | ORAL | Status: DC
  Administered 2023-06-23 – 2023-06-27 (×5): 10 mg via ORAL
  Filled 2023-06-23 (×5): qty 1

## 2023-06-23 MED ORDER — VANCOMYCIN HCL 1500 MG/300ML IV SOLN: 1500.0000 mg | INTRAVENOUS | Status: DC

## 2023-06-23 MED ORDER — COLCHICINE 0.6 MG PO TABS
0.6000 mg | ORAL_TABLET | Freq: Every day | ORAL | Status: DC
  Administered 2023-06-23 – 2023-06-27 (×5): 0.6 mg via ORAL
  Filled 2023-06-23 (×5): qty 1

## 2023-06-23 NOTE — TOC Initial Note (Addendum)
Transition of Care [facility]) - Initial/Assessment Note    Patient Details  Name: Tina Peters MRN: [id number] Date of Birth: Apr 08, 1951  Transition of Care [facility]) CM/SW Contact:    Truddie Hidden, RN Phone Number: 06/23/2023, 10:34 AM  Clinical Narrative:                 Patient HH arranged with Rolene Arbour on 2/11 for [facility] PT/ OT / RN Message sent toSarah from [facility] to notify of patient admission.a  2:00pm Spoke with patient at bedside to advise she was already active with [facility].  Pam from [employer] advised of  patient readmission.          Patient Goals and CMS Choice            Expected Discharge Plan and Services                                              Prior Living Arrangements/Services                       Activities of Daily Living   ADL Screening (condition at time of admission) Independently performs ADLs?: No Does the patient have a NEW difficulty with bathing/dressing/toileting/self-feeding that is expected to last >3 days?: No Does the patient have a NEW difficulty with getting in/out of bed, walking, or climbing stairs that is expected to last >3 days?: No Does the patient have a NEW difficulty with communication that is expected to last >3 days?: No Is the patient deaf or have difficulty hearing?: No Does the patient have difficulty seeing, even when wearing glasses/contacts?: No Does the patient have difficulty concentrating, remembering, or making decisions?: No  Permission Sought/Granted                  Emotional Assessment              Admission diagnosis:  Pleural effusion [J90] Acute on chronic diastolic CHF (congestive heart failure) (HCC) [I50.33] Leukocytosis, unspecified type [D72.829] Patient Active Problem List   Diagnosis Date Noted   Obesity, Class III, BMI 40-49.9 (morbid obesity) (HCC) 06/22/2023   Iron deficiency anemia 06/22/2023   Acute gout 06/22/2023   Chronic renal failure (CRF), stage  3b (HCC) 06/22/2023   Myocardial injury 06/21/2023   Leukocytosis 06/21/2023   Shortness of breath 06/21/2023   Pain of upper abdomen 06/21/2023   Acute on chronic right-sided heart failure (HCC) 06/21/2023   Hyponatremia 06/21/2023   Acute on chronic diastolic CHF (congestive heart failure) (HCC) 06/20/2023   Pleural effusion 06/20/2023   Elevated lactic acid level 06/20/2023   Stroke (HCC) 06/20/2023   Depression with anxiety 06/20/2023   Abnormal LFTs 06/20/2023   Acute kidney injury superimposed on CKD (HCC) 06/15/2023   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   Endocarditis of mitral valve 06/14/2023   Elevated troponin 06/14/2023   Nausea vomiting and diarrhea 06/11/2023   S/P MVR (mitral valve repair) 06/11/2023   Demand ischemia (HCC) 06/10/2023   Acute gastroenteritis 06/10/2023   Acute sepsis (HCC) 06/10/2023   Atrial flutter (HCC) 08/28/2022   Asthma 01/21/2022   Edema 01/09/2022   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 01/09/2022   Persistent atrial fibrillation (HCC) 11/22/2021   S/P MVR (mitral valve replacement) 11/08/2021   HFrEF (heart failure with reduced ejection fraction) (HCC) 11/08/2021   Chronic a-fib (  HCC) 11/08/2021   Acute bacterial endocarditis 10/02/2021   Bacteremia 09/30/2021   Atrial fibrillation with RVR (HCC) 09/29/2021   Chronic diarrhea 09/29/2021   Inflammatory bowel disease 09/21/2021   Primary osteoarthritis of right knee 09/12/2021   Chronic diarrhea of unknown origin 09/06/2021   Hyperlipidemia 08/16/2021   Prediabetes 08/16/2021   GAD (generalized anxiety disorder) 08/16/2021   Mild intermittent asthma without complication 08/16/2021   Moderate episode of recurrent major depressive disorder (HCC) 08/16/2021   OSA (obstructive sleep apnea) 03/01/2020   Chronic anticoagulation 03/01/2020   History of CVA (cerebrovascular accident) 03/01/2020   Low back pain 12/23/2016   Lumbar radiculopathy 12/23/2016   PAC (premature  atrial contraction)    PAT (paroxysmal atrial tachycardia) (HCC)    PAF (paroxysmal atrial fibrillation) (HCC)    Chronic venous insufficiency 09/29/2014   Paroxysmal SVT (supraventricular tachycardia) (HCC) 07/15/2014   Hypokalemia 07/15/2014   Essential hypertension    PCP:  Erasmo Downer, MD Pharmacy:   [facility] [address] [place] [facility] [address] [place] [address] Phone: [phone] Fax: [phone]  [facility] [address] [place] [address] Phone: [phone] Fax: [phone]     Social Drivers of Health (SDOH) Social History: SDOH Screenings   Food Insecurity: No Food Insecurity (06/21/2023)  Housing: Low Risk  (06/21/2023)  Transportation Needs: No Transportation Needs (06/21/2023)  Utilities: Not At Risk (06/21/2023)  Alcohol Screen: Low Risk  (01/27/2023)  Depression (PHQ2-9): Medium Risk (01/27/2023)  Financial Resource Strain: Medium Risk (01/27/2023)  Physical Activity: Insufficiently Active (01/27/2023)  Social Connections: Socially Isolated (06/21/2023)  Stress: Stress Concern Present (01/27/2023)  Tobacco Use: Medium Risk (06/21/2023)  Health Literacy: Adequate Health Literacy (01/27/2023)   SDOH Interventions:     Readmission Risk Interventions     No data to display

## 2023-06-23 NOTE — Care Management Important Message (Signed)
Important Message  Patient Details  Name: Tina Peters MRN: [id number] Date of Birth: May 12, 1950   Important Message Given:  Yes - Medicare IM     Danialle Dement W, CMA 06/23/2023, 10:16 AM

## 2023-06-23 NOTE — Assessment & Plan Note (Signed)
Platelet count dropped down to 77.  Could be secondary to infection.  Continue to monitor.

## 2023-06-23 NOTE — Plan of Care (Signed)
  Problem: Education: Goal: Knowledge of General Education information will improve Description: Including pain rating scale, medication(s)/side effects and non-pharmacologic comfort measures Outcome: Progressing   Problem: Health Behavior/Discharge Planning: Goal: Ability to manage health-related needs will improve Outcome: Progressing   Problem: Clinical Measurements: Goal: Ability to maintain clinical measurements within normal limits will improve Outcome: Progressing Goal: Will remain free from infection Outcome: Progressing Goal: Diagnostic test results will improve Outcome: Progressing Goal: Respiratory complications will improve Outcome: Progressing Goal: Cardiovascular complication will be avoided Outcome: Progressing   Problem: Clinical Measurements: Goal: Will remain free from infection Outcome: Progressing   Problem: Clinical Measurements: Goal: Diagnostic test results will improve Outcome: Progressing

## 2023-06-23 NOTE — Evaluation (Signed)
Physical Therapy Evaluation Patient Details Name: Tina Peters MRN: [id number] DOB: Jun 17, 1950 Today's Date: 06/23/2023  History of Present Illness  Pt admitted for acute on chronic CHF. Pt with history including Afib, HTN, HLD, CVA and CKD stage 3. Pt recently admitted for similar symptoms.  Clinical Impression  Pt is a pleasant 73 year old female who was admitted for acute on chronic CHF. Pt performs bed mobility, transfers, and ambulation with cga and RW. Pt demonstrates deficits with strength/pain/mobility. Pt reports severe pain (9/10) in R toe. Would benefit from skilled PT to address above deficits and promote optimal return to PLOF. Pt will continue to receive skilled PT services while admitted and will defer to TOC/care team for updates regarding disposition planning.           If plan is discharge home, recommend the following: A little help with walking and/or transfers;A little help with bathing/dressing/bathroom;Assistance with cooking/housework;Assist for transportation;Help with stairs or ramp for entrance   Can travel by private vehicle        Equipment Recommendations Rolling walker (2 wheels)  Recommendations for Other Services       Functional Status Assessment Patient has had a recent decline in their functional status and demonstrates the ability to make significant improvements in function in a reasonable and predictable amount of time.     Precautions / Restrictions Precautions Precautions: Fall Recall of Precautions/Restrictions: Intact Restrictions Weight Bearing Restrictions Per Provider Order: No      Mobility  Bed Mobility               General bed mobility comments: not tested, received on BSC    Transfers Overall transfer level: Needs assistance Equipment used: Rolling walker (2 wheels) Transfers: Bed to chair/wheelchair/BSC Sit to Stand: Contact guard assist   Step pivot transfers: Contact guard assist       General transfer  comment: safe technique with upright posture. Needs cues for Wbing through B UE to off load pain on R LE.    Ambulation/Gait Ambulation/Gait assistance: Contact guard assist Gait Distance (Feet): 5 Feet Assistive device: Rolling walker (2 wheels) Gait Pattern/deviations: Step-to pattern       General Gait Details: ambulated a few steps in room, however unable to further ambulate using RW due to pain.  Stairs            Wheelchair Mobility     Tilt Bed    Modified Rankin (Stroke Patients Only)       Balance Overall balance assessment: Needs assistance Sitting-balance support: No upper extremity supported Sitting balance-Leahy Scale: Good     Standing balance support: No upper extremity supported, During functional activity Standing balance-Leahy Scale: Fair                               Pertinent Vitals/Pain Pain Assessment Pain Assessment: 0-10 Pain Score: 9  Pain Location: R foot Pain Descriptors / Indicators: Aching Pain Intervention(s): Limited activity within patient's tolerance, Patient requesting pain meds-RN notified    Home Living Family/patient expects to be discharged to:: Private residence Living Arrangements: Children Available Help at Discharge: Family Type of Home: House Home Access: Stairs to enter Entrance Stairs-Rails: Can reach both Entrance Stairs-Number of Steps: 3+1   Home Layout: Two level;Able to live on main level with bedroom/bathroom Home Equipment: Rolling Walker (2 wheels);Cane - single point Additional Comments: pt usually lives alone, however is currently living with daughter. Above info  is daughter's home    Prior Function Prior Level of Function : Independent/Modified Independent;Driving             Mobility Comments: no AD use ADLs Comments: pt makes homemade candy     Extremity/Trunk Assessment   Upper Extremity Assessment Upper Extremity Assessment: Overall WFL for tasks assessed    Lower  Extremity Assessment Lower Extremity Assessment: Generalized weakness (B LE grossly 4/5; limited by pain)       Communication   Communication Communication: No apparent difficulties    Cognition Arousal: Alert Behavior During Therapy: WFL for tasks assessed/performed   PT - Cognitive impairments: No apparent impairments                         Following commands: Intact       Cueing Cueing Techniques: Verbal cues     General Comments      Exercises Other Exercises Other Exercises: Needs cga for standing from [facility]. Total assist for hygiene due to BM.   Assessment/Plan    PT Assessment Patient needs continued PT services  PT Problem List Decreased strength;Decreased activity tolerance;Decreased mobility;Decreased balance;Decreased knowledge of precautions;Decreased knowledge of use of DME       PT Treatment Interventions DME instruction;Gait training;Stair training;Functional mobility training;Therapeutic activities;Therapeutic exercise;Balance training;Patient/family education    PT Goals (Current goals can be found in the Care Plan section)  Acute Rehab PT Goals Patient Stated Goal: to get better and go home PT Goal Formulation: With patient Time For Goal Achievement: 06/27/23 Potential to Achieve Goals: Good    Frequency Min 1X/week     Co-evaluation               AM-PAC PT "6 Clicks" Mobility  Outcome Measure Help needed turning from your back to your side while in a flat bed without using bedrails?: A Little Help needed moving from lying on your back to sitting on the side of a flat bed without using bedrails?: A Little Help needed moving to and from a bed to a chair (including a wheelchair)?: A Little Help needed standing up from a chair using your arms (e.g., wheelchair or bedside chair)?: A Little Help needed to walk in hospital room?: A Little Help needed climbing 3-5 steps with a railing? : A Little 6 Click Score: 18    End of  Session   Activity Tolerance: Patient limited by pain Patient left: in chair Nurse Communication: Mobility status PT Visit Diagnosis: Unsteadiness on feet (R26.81);Muscle weakness (generalized) (M62.81);Other abnormalities of gait and mobility (R26.89)    Time: 1004-1020 PT Time Calculation (min) (ACUTE ONLY): 16 min   Charges:     PT Treatments $Therapeutic Activity: 8-22 mins PT General Charges $$ ACUTE PT VISIT: 1 Visit         Elizabeth Palau, PT, DPT, GCS [phone]   Fawn Desrocher 06/23/2023, 2:22 PM

## 2023-06-23 NOTE — Progress Notes (Signed)
Rounding Note    Patient Name: Tina Peters Date of Encounter: 06/23/2023  [facility] Cardiologist: Lorine Bears, MD   Subjective   Did not sleep well. No chest pain or palpitations. Dyspnea improving. Documented UOP 860 mL for the past 24 hours, net - 2.1 L for the admission. Renal function improved this morning (PM dose of IV Lasix held on 2/16). Hgb stable.   Inpatient Medications    Scheduled Meds:  acidophilus  1 capsule Oral BID   apixaban  5 mg Oral BID   aspirin EC  81 mg Oral Daily   atorvastatin  40 mg Oral QHS   Chlorhexidine Gluconate Cloth  6 each Topical Daily   furosemide  40 mg Intravenous Daily   methylPREDNISolone (SOLU-MEDROL) injection  40 mg Intravenous Daily   metoprolol tartrate  50 mg Oral BID   pantoprazole  40 mg Oral Daily   sertraline  50 mg Oral Daily   sodium chloride flush  10-40 mL Intracatheter Q12H   vancomycin variable dose per unstable renal function (pharmacist dosing)   Does not apply See admin instructions   Continuous Infusions:  ceFEPime (MAXIPIME) IV 2 g (06/23/23 0528)   PRN Meds: acetaminophen, albuterol, dextromethorphan-guaiFENesin, diphenhydrAMINE, hydrALAZINE, methocarbamol, nitroGLYCERIN, ondansetron (ZOFRAN) IV, sodium chloride flush, traMADol   Vital Signs    Vitals:   06/22/23 2019 06/22/23 2328 06/23/23 0311 06/23/23 0803  BP: 121/79 130/79 117/68 131/77  Pulse: 89 92 92 82  Resp: 20   18  Temp: 98.3 F (36.8 C) (!) 97.5 F (36.4 C) (!) 97.5 F (36.4 C) 98.2 F (36.8 C)  TempSrc: Oral Oral Oral Oral  SpO2: 97% 98% 96% 96%  Weight:      Height:        Intake/Output Summary (Last 24 hours) at 06/23/2023 0811 Last data filed at 06/22/2023 1756 Gross per 24 hour  Intake 340 ml  Output 1200 ml  Net -860 ml      06/21/2023    5:18 PM 06/11/2023   12:05 PM 06/09/2023    9:35 PM  Last 3 Weights  Weight (lbs) 241 lb 10 oz 224 lb 13.9 oz 224 lb 13.9 oz  Weight (kg) 109.6 kg 102 kg 102 kg       Telemetry    Atrial fibrillation rates 80s to 90s bpm - Personally Reviewed  ECG    No new tracings - Personally Reviewed  Physical Exam   GEN: No acute distress Neck: unable to determine JVD due to body habitus Cardiac: IRIR, no murmurs, rubs, or gallops  Respiratory: Mildly diminished breath sounds along the bases bilaterally. Respirations are unlabored at rest on room air GI: Soft, nontender, obese, non-distended  MS: Trivial pretibial edema Neuro:  Nonfocal  Psych: Normal affect   Labs    High Sensitivity Troponin:   Recent Labs  Lab 06/10/23 0053 06/10/23 0731 06/10/23 1531 06/20/23 1400 06/20/23 1808  TROPONINIHS 331* 362* 228* 105* 107*     Chemistry Recent Labs  Lab 06/20/23 1401 06/20/23 1808 06/21/23 0429 06/22/23 0440 06/23/23 0531  NA 135  --  134* 134* 135  K 5.1  --  5.0 4.2 4.0  CL 105  --  103 100 103  CO2 17*  --  16* 23 23  GLUCOSE 117*  --  102* 116* 172*  BUN 26*  --  31* 40* 37*  CREATININE 1.53*  --  1.63* 1.89* 1.70*  CALCIUM 8.6*  --  8.5* 8.2* 8.2*  MG  --  1.8  --   --   --   PROT 7.2  --  6.8  --   --   ALBUMIN 2.9*  --  2.8*  --   --   AST 102*  --  183*  --   --   ALT 43  --  71*  --   --   ALKPHOS 221*  --  192*  --   --   BILITOT 2.1*  --  2.9*  --   --   GFRNONAA 36*  --  33* 28* 32*  ANIONGAP 13  --  15 11 9     Lipids No results for input(s): "CHOL", "TRIG", "HDL", "LABVLDL", "LDLCALC", "CHOLHDL" in the last 168 hours.  Hematology Recent Labs  Lab 06/21/23 0429 06/22/23 0524 06/23/23 0531  WBC 23.5* 19.0* 18.0*  RBC 3.34* 3.21* 3.36*  HGB 8.8* 8.5* 8.8*  HCT 29.2* 26.9* 28.3*  MCV 87.4 83.8 84.2  MCH 26.3 26.5 26.2  MCHC 30.1 31.6 31.1  RDW 18.3* 18.1* 18.2*  PLT 169 111* 77*   Thyroid No results for input(s): "TSH", "FREET4" in the last 168 hours.  BNP Recent Labs  Lab 06/20/23 1400  BNP 1,353.9*    DDimer No results for input(s): "DDIMER" in the last 168 hours.   Radiology    US Abdomen Limited  RUQ (LIVER/GB) Result Date: 06/21/2023 IMPRESSION: 1. Cholelithiasis without findings of acute cholecystitis. 2. Right pleural effusion. Electronically Signed   By: Gaylyn Rong M.D.   On: 06/21/2023 10:06    Cardiac Studies  Limited 2 D echo 06/21/2023  1. Left ventricular ejection fraction, by estimation, is 55 to 60%. The  left ventricle has normal function. The left ventricle has no regional  wall motion abnormalities.   2. Right ventricular systolic function is moderately reduced. The right  ventricular size is moderately enlarged. Tricuspid regurgitation signal is  inadequate for assessing PA pressure.   3. Mitral valve mean gradient not measured.. The mitral valve has been  repaired/replaced. Mild mitral valve regurgitation. Small mobile opacity  again noted on leaflets concerning for valve vegetation.   4. The aortic valve is normal in structure. There is mild calcification  of the aortic valve. Aortic valve regurgitation is not visualized. Aortic  valve sclerosis is present, with no evidence of aortic valve stenosis.   Patient Profile     73 y.o. female with a past medical history of paroxysmal atrial fibrillation/flutter, tachybradycardia syndrome, PSVT, hypertension, hyperlipidemia, stroke, obstructive sleep apnea on CPAP, CKD 3, endocarditis status post MVR, asthma, tobacco abuse, chronic back pain, and diverticulosis, who is being seen and evaluated for heart failure exacerbation.  Assessment & Plan    Acute on chronic HFpEF exacerbation -Presented with worsening shortness of breath and swelling -BNP 1353 -Vascular congestion on chest x-ray with pleural effusions -Echocardiogram reveals an LVEF of 55-60% -Net negative 2.1 L for the admission -With AKI, IV Lasix was reduced to 40 mg daily, starting today -Heart failure education -Fluid restriction -Daily weights, I's and O's, low-sodium diet  Bioprosthetic mitral valve endocarditis -Previously hospitalized with  TEE completed with vegetation found on the valve, discharged with PICC line and continued antibiotic therapy with repeat TTE recommended in 6 weeks -CT surgery Dr. Leafy Ro recommended conservative management -Outpatient follow-up with TTE to be arranged -Limited study completed revealed an LVEF 55 to 60%, no RWMA, with a small mobile opacity again noted on the leaflets concerning for valve vegetation with mild mitral  regurgitation -Antibiotics continued per IM -Recommend consultation with ID, patient is known to that service  Paroxysmal atrial fibrillation/flutter -Review of telemetry reveals she is back in atrial flutter that is rate controlled -Previously evaluated by EP with patient declining rhythm control at that time, in this setting amiodarone was stopped on 2/16 -Remains on apixaban 5 mg twice daily for CHA2DS2-VASc of at least 7 for stroke prophylaxis as well as metoprolol tartrate 50 mg twice daily -Continued on telemetry monitoring  Acute on CKD stage III -Serum creatinine improving with holding of PM dose of IV on 2/16 -Baseline creatinine 1.0 -Serum creatinine on admission was 1.6 -Monitor urine output -Monitor/trend/replete electrolytes as needed -Daily BMP -Avoid nephrotoxic agents were able  Hypertension -Blood pressure 125/69 -Pharmacotherapy as above  Hyperlipidemia -Continued on atorvastatin  Demand ischemia -High-sensitivity troponin trended 105 to 107, flat not consistent with ACS -Chest CT negative for PE -Echocardiogram revealed an LVEF of 55-60%, no RWMA -Continued on aspirin 81 mg daily and atorvastatin -EKG as needed for pain or changes -No plan for ischemic workup      For questions or updates, please contact [facility] Please consult [email or web address] for contact info under        Signed, Eula Listen, PA-C  06/23/2023, 8:11 AM

## 2023-06-23 NOTE — Progress Notes (Addendum)
Pharmacy Antibiotic Note  Tina Peters is a 73 y.o. female admitted on 06/20/2023 with pneumonia.  Pharmacy has been consulted for Cefepime and Vancomyin dosing.  -Hx: recent endocarditis on Ceftriaxone as outpatient (until 07/22/2023) with increasing WBC. Discharged from hospital 3 days prior to most recent admission. . 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     Today, 06/23/2023 Day #3 vancomycin and cefepime (on ceftriaxone prior to admit) Renal: SCr 1.70 (history of CKD) WBC 18 - trending down thromboytopenia Afebrile 2/14 blood cx: NGTD AST and ALT increased, T bili increased  Vancomycin levels:  Vancomycin 2/14 1000mg  at 1848 then 1250mg  at 2117 2/15 vancomycin random = 16 mcg/ml at 1947 Vancomycin 2/15 1000mg  at 2054 2/16 vancomycin random = 28 mcg/ml at 0440, 18 mcg/ml at 1750 2/17 vancomycin random = 14 mcg/ml at 0531  Plan: Vancomycin 1gm IV x 1 ordered for 2/17, then based on random vancomycin levels, start vancomycin 1500mg  IV q48h on 2/18 at 2200 Estimated AUC 510 (goal AUC 400-600) Used SCr 1.70, Vd 0.5 L/kg Cefepime 2gm IV q12h Follow renal function  Check vancomycin levels based on antibiotic plan Await for ID to re-evaluate this admission   Follow renal fxn, cultures, length of therapy, etc Height: 5\' 5"  (165.1 cm) Weight: 109.6 kg (241 lb 10 oz) IBW/kg (Calculated) : 57  Temp (24hrs), Avg:98 F (36.7 C), Min:97.5 F (36.4 C), Max:98.3 F (36.8 C)  Recent Labs  Lab 06/17/23 0554 06/20/23 1401 06/20/23 1808 06/20/23 2114 06/21/23 0429 06/21/23 0757 06/21/23 1947 06/22/23 0440 06/22/23 0524 06/22/23 1750 06/23/23 0531  WBC 12.5* 20.7*  --   --  23.5*  --   --   --  19.0*  --  18.0*  CREATININE 1.63* 1.53*  --   --  1.63*  --   --  1.89*  --   --  1.70*  LATICACIDVEN  --   --  4.8* 3.8* 5.3* 5.0*  --  2.4*  --   --   --   VANCORANDOM  --   --   --   --   --   --     < > 28  --  18 14   < > = values in this interval not displayed.    Estimated Creatinine Clearance: 36.8 mL/min (A) (by C-G formula based on SCr of 1.7 mg/dL (H)).    No Known Allergies  Antimicrobials this admission: Metronidazole x1 in ED  vancomycin 2/14 >>   Cefepime 2/14>>  Dose adjustments this admission:   Thank you for allowing pharmacy to be a part of this patient's care.  Juliette Alcide, PharmD, BCPS, BCIDP Work Cell: [phone] 06/23/2023 11:50 AM

## 2023-06-23 NOTE — Progress Notes (Signed)
Progress Note   Patient: Tina Peters ZOX:[id number] DOB: 10-Jun-1950 DOA: 06/20/2023     3 DOS: the patient was seen and examined on 06/23/2023   Brief hospital course: 73 y.o. female with medical history significant of A-fib on Eliquis, hypertension, CKD stage IIIa, prior history of stroke, dCHF, hypertension, HLD, depression with anxiety, s/p bioprosthetic AVR in 10/2021, recent admission due to endocarditis on Rocephin via PICC line, lumbar radiculopathy, who presents with shortness breath, chest pain, abdominal pain.   Patient was recently hospitalized from 2/3 - 2/11 due to mitral valve endocarditis and Gemella bacteremia. TEE 02/05 showing mobile opacity on posterior leaflet mitral valve prosthetic valve. Cardiology following, have discussed w/ CT surgery and recs for NO surgery/replacement at this time, favor plan for repeat TEE in 6 weeks to reevaluate mitral valve. Pt is discharged on 6 weeks of IV Rocephin.  PICC line is placed on 2/10.   Pt states that she started having shortness of breath since this morning, which has been progressively worsening.  Patient has cough with clear mucus production.  Also reports left-sided chest pain, no fever or chills.  She has diffused abdominal pain, which is constant, aching, moderate, nonradiating.  She has nausea, dry heaves.  She states that she has some diarrhea recently, which has resolved.  Today no diarrhea.  Denies symptoms of UTI.  She complains of lower back pain.  Of note, patient had MRI spine L and T spine which had no evidence osteo/discitis.    Data reviewed independently and ED Course: pt was found to have BNP 1353, WBC 20.7 (12.5 on 06/17/2023), negative PCR for COVID, flu and RSV, lactic acid 4.8 --> 3.8, temperature normal, blood pressure 118/66, heart rate 90, RR 28, worsening renal function, liver function (ALP 221, AST 102, ALT 43, total bilirubin 2.1.  Patient is admitted to telemetry bed as inpatient.  2/15.  Patient still  complains of pain between the shoulder blades.  Added a muscle relaxant and changed her pain medications over to tramadol.  1 dose of morphine given for severe pain. 2/16.  Patient complaining severe pain in her right foot and right first toe.  Likely gout secondary to diuresis. 2/17.  Right foot pain much better.  Continue Solu-Medrol.  Cardiology decreased Lasix to daily dosing.   Assessment and Plan: * Acute on chronic diastolic CHF (congestive heart failure) (HCC) Consistent with right heart failure and pulmonary hypertension.  Echocardiogram showed decreased right ventricular function and normal left ventricular ejection fraction.  Continue IV Lasix daily 40 mg.  Cardiology following.  Acute gout Right first toe and right foot secondary to diuresis and kidney impairment.  Pain much better today.  Continue Solu-Medrol today.  Redose colchicine.  Endocarditis of mitral valve With elevation of white blood cell count, became more aggressive with antibiotics with vancomycin and cefepime on admission.  CT scan did not show pneumonia.  Blood cultures negative for 3 days.  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  Case discussed with infectious disease specialist and she may change antibiotics back to Rocephin.  Still has a white blood cell count elevation.  Pleural effusion Likely with right-sided heart failure  Elevated lactic acid level Trended in the right direction.  Acute kidney injury superimposed on CKD [facility]) Patient has baseline creatinine around 1.02.  Had acute kidney injury on last hospitalization.  Creatinine peaked at 1.89.  Creatinine down to 1.7  Chronic a-fib (HCC) On Eliquis for anticoagulation and metoprolol for rate control.  Hyperlipidemia On  atorvastatin  Essential hypertension Continue Lasix and metoprolol  History of CVA (cerebrovascular accident) On aspirin and atorvastatin  Myocardial injury Demand ischemia from CHF  Abnormal LFTs Could be  secondary to infection.  Right upper quadrant shows gallstones but no evidence of cholecystitis.  OSA (obstructive sleep apnea) CPAP at night  Depression with anxiety Continue Zoloft  Thrombocytopenia (HCC) Platelet count dropped down to 77.  Could be secondary to infection.  Continue to monitor.  Iron deficiency anemia Hemoglobin 8.5 and ferritin 67.  Obesity, Class III, BMI 40-49.9 (morbid obesity) (HCC) BMI 40.21 with current height and weight in computer  Hyponatremia Sodium normal range        Subjective: Patient feeling okay.  Pain much improved right foot..  Breathing a little bit better today.  Admitted with heart failure.  Physical Exam: Vitals:   06/22/23 2328 06/23/23 0311 06/23/23 0803 06/23/23 1254  BP: 130/79 117/68 131/77 (!) 122/95  Pulse: 92 92 82 (!) 41  Resp:   18 20  Temp: (!) 97.5 F (36.4 C) (!) 97.5 F (36.4 C) 98.2 F (36.8 C) 98.3 F (36.8 C)  TempSrc: Oral Oral Oral Oral  SpO2: 98% 96% 96% 100%  Weight:   104.6 kg   Height:       Physical Exam HENT:     Head: Normocephalic.  Eyes:     General: Lids are normal.     Conjunctiva/sclera: Conjunctivae normal.  Cardiovascular:     Rate and Rhythm: Normal rate. Rhythm irregularly irregular.     Heart sounds: Normal heart sounds, S1 normal and S2 normal.  Pulmonary:     Breath sounds: Examination of the right-lower field reveals decreased breath sounds. Examination of the left-lower field reveals decreased breath sounds. Decreased breath sounds present. No wheezing, rhonchi or rales.  Abdominal:     Palpations: Abdomen is soft.     Tenderness: There is no abdominal tenderness.  Musculoskeletal:     Right lower leg: Swelling present.     Left lower leg: Swelling present.     Comments: Pain improved right foot and toe  Skin:    General: Skin is warm.     Findings: No rash.  Neurological:     Mental Status: She is alert and oriented to person, place, and time.     Data  Reviewed: Creatinine 1.7, white blood cell count 18.0, hemoglobin 8.8, platelet count 77  Family Communication: Updated patient's daughter on the phone  Disposition: Status is: Inpatient Remains inpatient appropriate because: Continue IV diuresis.  Continue treatment for gout.  Planned Discharge Destination: Home with Home Health    Time spent: 27 minutes  Author: Alford Highland, MD 06/23/2023 1:19 PM  For on call review [email or web address].

## 2023-06-23 NOTE — Progress Notes (Signed)
Heart Failure Nurse Navigator Progress Note  PCP: Erasmo Downer, MD PCP-Cardiologist: Lorine Bears, MD Admission Diagnosis: Leukocytosis, unspecified type Pleural effusion Admitted from: Home  Presentation:   Tina Peters presented with shortness of breath and weakness. Pt also complained of chills. Lower extremity edema. BNP 1,353.9.   ECHO/ LVEF: 55-60%  Clinical Course:  Past Medical History:  Diagnosis Date   Allergy    Anxiety    Arthritis    Asthma    Chronic back pain    Chronic combined systolic (congestive) and diastolic (congestive) heart failure (HCC)    a. 02/2020 Echo: EF 60-65%, no rwma. Gr2 DD. Nl RV size/fxn. Mild LAE. Mild MR; b. 10/2021 Echo: [facility] following MVR) EF 40 to 45%.   Depression    Diverticulitis    Endocarditis    a. 10/2021 S sanguinous bacteremia w/ MV veg-->38mm Mitris bioprosthetic valve, Maze, and LAA clip.   Hyperlipidemia    Hypertension    Lipoma    Mild mitral regurgitation    a. 08/2016 Echo: mild MR; b. 02/2020 Echo: Mild MR.   Neuromuscular disorder (HCC)    Obesity    Obstructive sleep apnea    PAF (paroxysmal atrial fibrillation) (HCC)    a. Dx 05/2016--> Xarelto (CHA2DS2VASc = 3); b. 08/2016 Echo: EF 55-60%, no rwma, mild MR, mod dil LA; c. 03/2020 [facility]: Avg HR 61 w/ Afib burden of 24% - max rate 189 (avg 93). 2 pauses - longest 4.7 secs post-conversion pauses (occurred @ 5:42 am); d. 04/2022 s/p DCCV; e. 08/2022 s/p DCCV.   Paroxysmal SVT (supraventricular tachycardia) (HCC)    S/P mitral valve replacement    a. 10/2021 [facility] - Endocarditis/MV Veg-->69mm Mitris bioprosthetic valve, Maze, and LAA clip.   Sleep apnea    Stroke [facility])    a. 02/2020 MRI/A: multifocal acute ischemia in R MCA territory predominantly involving post insula and R middle frontal gyrus. Nl MRA.   Tachy-brady syndrome (HCC)    Tobacco abuse      Social History   Socioeconomic History   Marital status: Legally Separated    Spouse name: Not on file    Number of children: 2   Years of education: Not on file   Highest education level: Associate degree: occupational, Scientist, product/process development, or vocational program  Occupational History   Occupation: retired  Tobacco Use   Smoking status: Former    Current packs/day: 0.00    Average packs/day: 0.3 packs/day for 40.0 years (10.0 ttl pk-yrs)    Types: Cigarettes    Start date: 25    Quit date: 2015    Years since quitting: 10.1   Smokeless tobacco: Never   Tobacco comments:    Used to smoke heavily, cut back in 2015  Vaping Use   Vaping status: Never Used  Substance and Sexual Activity   Alcohol use: Yes    Comment: socially, rare intake   Drug use: No   Sexual activity: Yes    Partners: Male    Birth control/protection: Post-menopausal  Other Topics Concern   Not on file  Social History Narrative   Lives alone.   Social Drivers of Health   Financial Resource Strain: Medium Risk (06/23/2023)   Overall Financial Resource Strain (CARDIA)    Difficulty of Paying Living Expenses: Somewhat hard  Food Insecurity: No Food Insecurity (06/21/2023)   Hunger Vital Sign    Worried About Running Out of Food in the Last Year: Never true    Ran Out of  Food in the Last Year: Never true  Transportation Needs: No Transportation Needs (06/23/2023)   PRAPARE - Administrator, Civil Service (Medical): No    Lack of Transportation (Non-Medical): No  Physical Activity: Insufficiently Active (01/27/2023)   Exercise Vital Sign    Days of Exercise per Week: 1 day    Minutes of Exercise per Session: 20 min  Stress: Stress Concern Present (01/27/2023)   [employer] of Occupational Health - Occupational Stress Questionnaire    Feeling of Stress : To some extent  Social Connections: Socially Isolated (06/21/2023)   Social Connection and Isolation Panel [NHANES]    Frequency of Communication with Friends and Family: More than three times a week    Frequency of Social Gatherings with Friends and  Family: Twice a week    Attends Religious Services: Never    Database administrator or Organizations: No    Attends Engineer, structural: Never    Marital Status: Divorced   Water engineer and Provision:  Detailed education and instructions provided on heart failure disease management including the following:  Signs and symptoms of Heart Failure When to call the physician Importance of daily weights Low sodium diet Fluid restriction Medication management Anticipated future follow-up appointments  Patient education given on each of the above topics.  Patient acknowledges understanding via teach back method and acceptance of all instructions.  Education Materials:  "Living Better With Heart Failure" Booklet, HF zone tool, & Daily Weight Tracker Tool.  Patient has scale at home: Yes Patient has pill box at home: Yes    High Risk Criteria for Readmission and/or Poor Patient Outcomes: Heart failure hospital admissions (last 6 months): 0  No Show rate: 13% Difficult social situation: None Demonstrates medication adherence: Yes Primary Language: English Literacy level: Reading, Writing & Comprehension  Barriers of Care:   Diet & Fluid Restrictions Daily weights Medication Compliance  Considerations/Referrals:   Referral made to Heart Failure Pharmacist Stewardship: Yes Referral made to Heart Failure CSW/NCM TOC: No Referral made to Heart & Vascular TOC clinic: Yes. 07/01/23 @ 9:00 AM  Items for Follow-up on DC/TOC: Diet & Fluid Restrictions Daily Weights Medication Compliance Continued Heart Failure Education   Roxy Horseman, RN, BSN [facility] Heart Failure Navigator Secure Chat Only

## 2023-06-23 NOTE — Consult Note (Signed)
NAME: Tina Peters  DOB: 04-09-1951  MRN: [id number]  Date/Time: 06/23/2023 12:36 PM  REQUESTING PROVIDER: Dr.Wieting Subjective:  REASON FOR CONSULT: bacteremia /endocarditis ? Tina Peters is a 73 y.o. 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 Had Blood culture positive for gemella,in 1 set of blood culture. TEE showed vegetation on the prosthetic mitral valve. Repeat Blood culture 06/13/23 neg . Cardiac surgery at [facility] was not considering any intervention, and asked to treat her with 6 weeks of IV and then repeat a TEE. WBC was as high as 44 at one time, and declined to 12 on discharge. She had PICC placement on 06/16/23 and was discharged home on 2/11  She Presented to the ED with sob and chest pain and abdominal pain on 06/20/23 BP 126/76  06/20/23 13:57  Temp 97.8 F (36.6 C)  Pulse Rate 87  Resp 17  SpO2 98 %    Latest Reference Range & Units 06/20/23 14:01  WBC 4.0 - 10.5 K/uL 20.7 (H)  Hemoglobin 12.0 - 15.0 g/dL 9.2 (L)  HCT 91.4 - 78.2 % 31.0 (L)  Platelets 150 - 400 K/uL 186  Creatinine 0.44 - 1.00 mg/dL 9.56 (H)   As per patient she was feeling Sob since 2/13- Was having fullness and a feeling of constriction upper abdomen, also having bloating sensation Mid back and low back pain Poor appetite No fever or chills No diarrhea Was constipated for 3 days  CTA of lung no PE but had b/l pleural effusion, features of Pulmonary artery hypertension She was started on IV furosemide and also antibiotics broadened to vanco and cefepime Pt's breathing improved with IV frusemide but she  devloped pain rt foot and diagnosed with gout as Uric acid>9 Being treated with colchicine and steroids   Past Medical History:  Diagnosis Date   Allergy    Anxiety    Arthritis    Asthma    Chronic back pain    Chronic combined systolic (congestive) and diastolic (congestive) heart failure (HCC)    a. 02/2020 Echo: EF 60-65%, no rwma. Gr2 DD. Nl RV size/fxn. Mild LAE. Mild MR; b. 10/2021 Echo: [facility] following MVR) EF 40 to 45%.   Depression    Diverticulitis    Endocarditis    a. 10/2021 S sanguinous bacteremia w/ MV veg-->22mm Mitris bioprosthetic valve, Maze, and LAA clip.   Hyperlipidemia    Hypertension    Lipoma    Mild mitral regurgitation    a. 08/2016 Echo: mild MR; b. 02/2020 Echo: Mild MR.   Neuromuscular disorder (HCC)    Obesity    Obstructive sleep apnea    PAF (paroxysmal atrial fibrillation) (HCC)    a. Dx 05/2016--> Xarelto (CHA2DS2VASc = 3); b. 08/2016 Echo: EF 55-60%, no rwma, mild MR, mod dil LA; c. 03/2020 Zio: Avg HR 61 w/ Afib burden of 24% - max rate 189 (avg 93). 2 pauses - longest 4.7 secs post-conversion pauses (occurred @ 5:42 am); d. 04/2022 s/p DCCV; e. 08/2022 s/p DCCV.   Paroxysmal SVT (supraventricular tachycardia) (HCC)    S/P mitral valve replacement    a. 10/2021 [facility] - Endocarditis/MV Veg-->56mm Mitris bioprosthetic valve,  Maze, and LAA clip.   Sleep apnea    Stroke [facility])    a. 02/2020 MRI/A: multifocal acute ischemia in R MCA territory predominantly involving post insula and R middle frontal gyrus. Nl MRA.   Tachy-brady syndrome (HCC)    Tobacco abuse     Past Surgical History:  Procedure Laterality Date   ACHILLES TENDON SURGERY Left    BACK SURGERY     2 ruptured discs in L spine, no hardware   CARDIOVERSION N/A 04/22/2022   Procedure: CARDIOVERSION;  Surgeon: Iran Ouch, MD;  Location: [facility] ORS;  Service: Cardiovascular;  Laterality: N/A;   CARDIOVERSION N/A 08/28/2022   Procedure: CARDIOVERSION;  Surgeon: Debbe Odea, MD;   Location: [facility] ORS;  Service: Cardiovascular;  Laterality: N/A;   KNEE ARTHROSCOPY W/ MENISCAL REPAIR Right    in the 1990s   SALPINGOOPHORECTOMY Bilateral    TEE WITHOUT CARDIOVERSION N/A 06/11/2023   Procedure: TRANSESOPHAGEAL ECHOCARDIOGRAM (TEE);  Surgeon: Antonieta Iba, MD;  Location: [facility] ORS;  Service: Cardiovascular;  Laterality: N/A;   TUMOR REMOVAL     neck, ?lipoma   VALVE REPLACEMENT  10/04/2021    Social History   Socioeconomic History   Marital status: Legally Separated    Spouse name: Not on file   Number of children: 2   Years of education: Not on file   Highest education level: Associate degree: occupational, Scientist, product/process development, or vocational program  Occupational History   Occupation: retired  Tobacco Use   Smoking status: Former    Current packs/day: 0.00    Average packs/day: 0.3 packs/day for 40.0 years (10.0 ttl pk-yrs)    Types: Cigarettes    Start date: 38    Quit date: 2015    Years since quitting: 10.1   Smokeless tobacco: Never   Tobacco comments:    Used to smoke heavily, cut back in 2015  Vaping Use   Vaping status: Never Used  Substance and Sexual Activity   Alcohol use: Yes    Comment: socially, rare intake   Drug use: No   Sexual activity: Yes    Partners: Male    Birth control/protection: Post-menopausal  Other Topics Concern   Not on file  Social History Narrative   Lives alone.   Social Drivers of Health   Financial Resource Strain: Medium Risk (06/23/2023)   Overall Financial Resource Strain (CARDIA)    Difficulty of Paying Living Expenses: Somewhat hard  Food Insecurity: No Food Insecurity (06/21/2023)   Hunger Vital Sign    Worried About Running Out of Food in the Last Year: Never true    Ran Out of Food in the Last Year: Never true  Transportation Needs: No Transportation Needs (06/23/2023)   PRAPARE - Administrator, Civil Service (Medical): No    Lack of Transportation (Non-Medical): No  Physical Activity:  Insufficiently Active (01/27/2023)   Exercise Vital Sign    Days of Exercise per Week: 1 day    Minutes of Exercise per Session: 20 min  Stress: Stress Concern Present (01/27/2023)   [employer] of Occupational Health - Occupational Stress Questionnaire    Feeling of Stress : To some extent  Social Connections: Socially Isolated (06/21/2023)   Social Connection and Isolation Panel [NHANES]    Frequency of Communication with Friends and Family: More than three times a week    Frequency of Social Gatherings with Friends and Family: Twice a week    Attends Religious Services: Never    Active Member  of Clubs or Organizations: No    Attends Banker Meetings: Never    Marital Status: Divorced  Catering manager Violence: Not At Risk (06/21/2023)   Humiliation, Afraid, Rape, and Kick questionnaire    Fear of Current or Ex-Partner: No    Emotionally Abused: No    Physically Abused: No    Sexually Abused: No    Family History  Problem Relation Age of Onset   Stroke Mother    Diabetes Mother    Fibrocystic breast disease Mother    CAD Father    Alcoholism Father    Fibrocystic breast disease Sister    Diabetes Mellitus I Grandson    Breast cancer Neg Hx    Colon cancer Neg Hx    No Known Allergies I? Current Facility-Administered Medications  Medication Dose Route Frequency Provider Last Rate Last Admin   acetaminophen (TYLENOL) tablet 650 mg  650 mg Oral Q6H PRN Alford Highland, MD   650 mg at 06/22/23 0117   acidophilus (RISAQUAD) capsule 1 capsule  1 capsule Oral BID Lorretta Harp, MD   1 capsule at 06/23/23 1055   albuterol (PROVENTIL) (2.5 MG/3ML) 0.083% nebulizer solution 2.5 mg  2.5 mg Inhalation Q4H PRN Lorretta Harp, MD       apixaban Everlene Balls) tablet 5 mg  5 mg Oral BID Lorretta Harp, MD   5 mg at 06/23/23 1054   aspirin EC tablet 81 mg  81 mg Oral Daily Lorretta Harp, MD   81 mg at 06/23/23 1055   atorvastatin (LIPITOR) tablet 40 mg  40 mg Oral QHS Lorretta Harp, MD   40  mg at 06/22/23 2020   ceFEPIme (MAXIPIME) 2 g in sodium chloride 0.9 % 100 mL IVPB  2 g Intravenous Q12H Angelique Blonder, RPH 200 mL/hr at 06/23/23 0528 2 g at 06/23/23 1610   Chlorhexidine Gluconate Cloth 2 % PADS 6 each  6 each Topical Daily Lorretta Harp, MD   6 each at 06/23/23 1100   dextromethorphan-guaiFENesin (MUCINEX DM) 30-600 MG per 12 hr tablet 1 tablet  1 tablet Oral BID PRN Lorretta Harp, MD   1 tablet at 06/20/23 2342   diphenhydrAMINE (BENADRYL) injection 12.5 mg  12.5 mg Intravenous Q8H PRN Lorretta Harp, MD   12.5 mg at 06/21/23 0040   furosemide (LASIX) injection 40 mg  40 mg Intravenous Daily Hammock, Lavonna Rua, NP   40 mg at 06/23/23 1055   hydrALAZINE (APRESOLINE) injection 5 mg  5 mg Intravenous Q2H PRN Lorretta Harp, MD       methocarbamol (ROBAXIN) tablet 500 mg  500 mg Oral Q8H PRN Lorretta Harp, MD   500 mg at 06/22/23 9604   methylPREDNISolone sodium succinate (SOLU-MEDROL) 40 mg/mL injection 40 mg  40 mg Intravenous Daily Alford Highland, MD   40 mg at 06/23/23 1100   metoprolol tartrate (LOPRESSOR) tablet 50 mg  50 mg Oral BID Lorretta Harp, MD   50 mg at 06/23/23 1055   nitroGLYCERIN (NITROSTAT) SL tablet 0.4 mg  0.4 mg Sublingual Q5 min PRN Alford Highland, MD   0.4 mg at 06/21/23 1241   ondansetron (ZOFRAN) injection 4 mg  4 mg Intravenous Q6H PRN Alford Highland, MD   4 mg at 06/21/23 1237   pantoprazole (PROTONIX) EC tablet 40 mg  40 mg Oral Daily Alford Highland, MD   40 mg at 06/23/23 1054   sertraline (ZOLOFT) tablet 50 mg  50 mg Oral Daily Lorretta Harp, MD   50 mg at 06/23/23  1054   sodium chloride flush (NS) 0.9 % injection 10-40 mL  10-40 mL Intracatheter Q12H Lorretta Harp, MD   10 mL at 06/23/23 1122   sodium chloride flush (NS) 0.9 % injection 10-40 mL  10-40 mL Intracatheter PRN Lorretta Harp, MD       traMADol Janean Sark) tablet 50 mg  50 mg Oral Q8H PRN Alford Highland, MD   50 mg at 06/22/23 0931   [START ON 06/24/2023] vancomycin (VANCOREADY) IVPB 1500 mg/300 mL  1,500 mg  Intravenous Q48H Zeigler, Burtis Junes, RPH         Abtx:  Anti-infectives (From admission, onward)    Start     Dose/Rate Route Frequency Ordered Stop   06/24/23 2200  vancomycin (VANCOREADY) IVPB 1500 mg/300 mL        1,500 mg 150 mL/hr over 120 Minutes Intravenous Every 48 hours 06/23/23 1156     06/23/23 1000  vancomycin (VANCOCIN) IVPB 1000 mg/200 mL premix        1,000 mg 200 mL/hr over 60 Minutes Intravenous  Once 06/23/23 0857 06/23/23 1221   06/21/23 2200  vancomycin (VANCOCIN) IVPB 1000 mg/200 mL premix  Status:  Discontinued        1,000 mg 200 mL/hr over 60 Minutes Intravenous Every 24 hours 06/20/23 1958 06/22/23 0704   06/21/23 1337  vancomycin variable dose per unstable renal function (pharmacist dosing)  Status:  Discontinued         Does not apply See admin instructions 06/21/23 1338 06/23/23 1156   06/21/23 0600  ceFEPIme (MAXIPIME) 2 g in sodium chloride 0.9 % 100 mL IVPB        2 g 200 mL/hr over 30 Minutes Intravenous Every 12 hours 06/20/23 1954     06/21/23 0500  metroNIDAZOLE (FLAGYL) IVPB 500 mg  Status:  Discontinued        500 mg 100 mL/hr over 60 Minutes Intravenous Every 12 hours 06/21/23 0136 06/21/23 1329   06/21/23 0145  metroNIDAZOLE (FLAGYL) IVPB 500 mg  Status:  Discontinued        500 mg 100 mL/hr over 60 Minutes Intravenous Every 12 hours 06/21/23 0135 06/21/23 0136   06/20/23 2030  vancomycin (VANCOREADY) IVPB 1250 mg/250 mL        1,250 mg 166.7 mL/hr over 90 Minutes Intravenous  Once 06/20/23 1954 06/20/23 2357   06/20/23 1715  vancomycin (VANCOCIN) IVPB 1000 mg/200 mL premix        1,000 mg 200 mL/hr over 60 Minutes Intravenous  Once 06/20/23 1707 06/20/23 2000   06/20/23 1715  ceFEPIme (MAXIPIME) 2 g in sodium chloride 0.9 % 100 mL IVPB        2 g 200 mL/hr over 30 Minutes Intravenous  Once 06/20/23 1707 06/20/23 1846   06/20/23 1715  metroNIDAZOLE (FLAGYL) IVPB 500 mg        500 mg 100 mL/hr over 60 Minutes Intravenous  Once 06/20/23 1707  06/20/23 1930       REVIEW OF SYSTEMS:  Const: negative fever, negative chills, negative weight loss Eyes: negative diplopia or visual changes, negative eye pain ENT: negative coryza, negative sore throat Resp: negative cough, hemoptysis, has dyspnea Cards: negative for chest pain, palpitations, lower extremity edema GU: negative for frequency, dysuria and hematuria GI:as above Skin: negative for rash and pruritus Heme: negative for easy bruising and gum/nose bleeding MS:  weakness Neurolo:negative for headaches, dizziness, vertigo, memory problems  Psych: negative for feelings of anxiety, depression  Endocrine: negative for  thyroid, diabetes Allergy/Immunology- negative for any medication or food allergies ? Objective:  VITALS:  BP 131/77 (BP Location: Left Arm)   Pulse 82   Temp 98.2 F (36.8 C) (Oral)   Resp 18   Ht 5\' 5"  (1.651 m)   Wt 104.6 kg   SpO2 96%   BMI 38.39 kg/m  LDA Rt PICC PHYSICAL EXAM:  General: Alert, cooperative, no distress, appears stated age. pale Head: Normocephalic, without obvious abnormality, atraumatic. Eyes: Conjunctivae clear, anicteric sclerae. Pupils are equal ENT Nares normal. No drainage or sinus tenderness. Lips, mucosa, and tongue normal. No Thrush Dentition poor Neck: Supple, symmetrical, no adenopathy, thyroid: non tender no carotid bruit and no JVD. Back: No CVA tenderness. Lungs:b/l air entry Crepts bases Heart: irregular Abdomen: Soft, non-tender,not distended. Bowel sounds normal. No masses Extremities: atraumatic, no cyanosis. No edema. No clubbing Skin: No rashes or lesions. Or bruising Lymph: Cervical, supraclavicular normal. Neurologic: Grossly non-focal Pertinent Labs Lab Results CBC    Component Value Date/Time   WBC 18.0 (H) 06/23/2023 0531   RBC 3.36 (L) 06/23/2023 0531   HGB 8.8 (L) 06/23/2023 0531   HGB 11.9 05/16/2023 1350   HCT 28.3 (L) 06/23/2023 0531   HCT 38.5 05/16/2023 1350   PLT 77 (L)  06/23/2023 0531   PLT 254 05/16/2023 1350   MCV 84.2 06/23/2023 0531   MCV 86 05/16/2023 1350   MCH 26.2 06/23/2023 0531   MCHC 31.1 06/23/2023 0531   RDW 18.2 (H) 06/23/2023 0531   RDW 14.4 05/16/2023 1350   LYMPHSABS 1.7 06/11/2023 0635   LYMPHSABS 2.6 11/20/2021 1117   MONOABS 0.9 06/11/2023 0635   EOSABS 0.1 06/11/2023 0635   EOSABS 0.1 11/20/2021 1117   BASOSABS 0.1 06/11/2023 0635   BASOSABS 0.0 11/20/2021 1117       Latest Ref Rng & Units 06/23/2023    5:31 AM 06/22/2023    4:40 AM 06/21/2023    4:29 AM  CMP  Glucose 70 - 99 mg/dL 161  096  045   BUN 8 - 23 mg/dL 37  40  31   Creatinine 0.44 - 1.00 mg/dL 4.09  8.11  9.14   Sodium 135 - 145 mmol/L 135  134  134   Potassium 3.5 - 5.1 mmol/L 4.0  4.2  5.0   Chloride 98 - 111 mmol/L 103  100  103   CO2 22 - 32 mmol/L 23  23  16    Calcium 8.9 - 10.3 mg/dL 8.2  8.2  8.5   Total Protein 6.5 - 8.1 g/dL   6.8   Total Bilirubin 0.0 - 1.2 mg/dL   2.9   Alkaline Phos 38 - 126 U/L   192   AST 15 - 41 U/L   183   ALT 0 - 44 U/L   71       Microbiology: Recent Results (from the past 240 hours)  Resp panel by RT-PCR (RSV, Flu A&B, Covid) Anterior Nasal Swab     Status: None   Collection Time: 06/20/23  6:08 PM   Specimen: Anterior Nasal Swab  Result Value Ref Range Status   SARS Coronavirus 2 by RT PCR NEGATIVE NEGATIVE Final    Comment: (NOTE) SARS-CoV-2 target nucleic acids are NOT DETECTED.  The SARS-CoV-2 RNA is generally detectable in upper respiratory specimens during the acute phase of infection. The lowest concentration of SARS-CoV-2 viral copies this assay can detect is 138 copies/mL. A negative result does not preclude SARS-Cov-2 infection and should  not be used as the sole basis for treatment or other patient management decisions. A negative result may occur with  improper specimen collection/handling, submission of specimen other than nasopharyngeal swab, presence of viral mutation(s) within the areas  targeted by this assay, and inadequate number of viral copies(<138 copies/mL). A negative result must be combined with clinical observations, patient history, and epidemiological information. The expected result is Negative.  Fact Sheet for Patients:  BloggerCourse.com  Fact Sheet for Healthcare Providers:  SeriousBroker.it  This test is no t yet approved or cleared by the [place] FDA and  has been authorized for detection and/or diagnosis of SARS-CoV-2 by FDA under an Emergency Use Authorization (EUA). This EUA will remain  in effect (meaning this test can be used) for the duration of the COVID-19 declaration under Section 564(b)(1) of the Act, 21 U.S.C.section 360bbb-3(b)(1), unless the authorization is terminated  or revoked sooner.       Influenza A by PCR NEGATIVE NEGATIVE Final   Influenza B by PCR NEGATIVE NEGATIVE Final    Comment: (NOTE) The Xpert Xpress SARS-CoV-2/FLU/RSV plus assay is intended as an aid in the diagnosis of influenza from Nasopharyngeal swab specimens and should not be used as a sole basis for treatment. Nasal washings and aspirates are unacceptable for Xpert Xpress SARS-CoV-2/FLU/RSV testing.  Fact Sheet for Patients: BloggerCourse.com  Fact Sheet for Healthcare Providers: SeriousBroker.it  This test is not yet approved or cleared by the [place] FDA and has been authorized for detection and/or diagnosis of SARS-CoV-2 by FDA under an Emergency Use Authorization (EUA). This EUA will remain in effect (meaning this test can be used) for the duration of the COVID-19 declaration under Section 564(b)(1) of the Act, 21 U.S.C. section 360bbb-3(b)(1), unless the authorization is terminated or revoked.     Resp Syncytial Virus by PCR NEGATIVE NEGATIVE Final    Comment: (NOTE) Fact Sheet for  Patients: BloggerCourse.com  Fact Sheet for Healthcare Providers: SeriousBroker.it  This test is not yet approved or cleared by the [place] FDA and has been authorized for detection and/or diagnosis of SARS-CoV-2 by FDA under an Emergency Use Authorization (EUA). This EUA will remain in effect (meaning this test can be used) for the duration of the COVID-19 declaration under Section 564(b)(1) of the Act, 21 U.S.C. section 360bbb-3(b)(1), unless the authorization is terminated or revoked.  Performed at [facility], [address] [place], [place] [address]   Blood culture (routine x 2)     Status: None (Preliminary result)   Collection Time: 06/20/23  6:08 PM   Specimen: BLOOD  Result Value Ref Range Status   Specimen Description BLOOD BLOOD RIGHT ARM  Final   Special Requests   Final    BOTTLES DRAWN AEROBIC AND ANAEROBIC Blood Culture results may not be optimal due to an inadequate volume of blood received in culture bottles   Culture   Final    NO GROWTH 3 DAYS Performed at [facility], [address] [place], [place] [address]    Report Status PENDING  Incomplete  Blood culture (routine x 2)     Status: None (Preliminary result)   Collection Time: 06/20/23  6:09 PM   Specimen: BLOOD  Result Value Ref Range Status   Specimen Description BLOOD BLOOD LEFT ARM  Final   Special Requests   Final    BOTTLES DRAWN AEROBIC AND ANAEROBIC Blood Culture results may not be optimal due to an inadequate volume of blood received in culture  bottles   Culture   Final    NO GROWTH 3 DAYS Performed at [facility], [address] [place], [place] [address]    Report Status PENDING  Incomplete  MRSA Next Gen by PCR, Nasal     Status: None   Collection Time: 06/21/23  4:29 AM   Specimen: Nasal Mucosa; Nasal Swab  Result Value Ref Range Status   MRSA by PCR Next Gen NOT DETECTED NOT DETECTED Final     Comment: (NOTE) The GeneXpert MRSA Assay (FDA approved for NASAL specimens only), is one component of a comprehensive MRSA colonization surveillance program. It is not intended to diagnose MRSA infection nor to guide or monitor treatment for MRSA infections. Test performance is not FDA approved in patients less than 24 years old. 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 Discussed with requesting provider _______________________________________________

## 2023-06-24 ENCOUNTER — Ambulatory Visit: Payer: Medicare HMO | Admitting: Cardiology

## 2023-06-24 ENCOUNTER — Telehealth: Payer: Self-pay | Admitting: Family Medicine

## 2023-06-24 DIAGNOSIS — F32A Depression, unspecified: Secondary | ICD-10-CM | POA: Diagnosis not present

## 2023-06-24 DIAGNOSIS — M810 Age-related osteoporosis without current pathological fracture: Secondary | ICD-10-CM | POA: Diagnosis not present

## 2023-06-24 DIAGNOSIS — I110 Hypertensive heart disease with heart failure: Secondary | ICD-10-CM | POA: Diagnosis not present

## 2023-06-24 DIAGNOSIS — Z792 Long term (current) use of antibiotics: Secondary | ICD-10-CM | POA: Diagnosis not present

## 2023-06-24 DIAGNOSIS — A419 Sepsis, unspecified organism: Secondary | ICD-10-CM | POA: Diagnosis not present

## 2023-06-24 DIAGNOSIS — D72829 Elevated white blood cell count, unspecified: Secondary | ICD-10-CM | POA: Diagnosis not present

## 2023-06-24 DIAGNOSIS — M1000 Idiopathic gout, unspecified site: Secondary | ICD-10-CM | POA: Diagnosis not present

## 2023-06-24 DIAGNOSIS — Z556 Problems related to health literacy: Secondary | ICD-10-CM | POA: Diagnosis not present

## 2023-06-24 DIAGNOSIS — I059 Rheumatic mitral valve disease, unspecified: Secondary | ICD-10-CM | POA: Diagnosis not present

## 2023-06-24 DIAGNOSIS — R7989 Other specified abnormal findings of blood chemistry: Secondary | ICD-10-CM | POA: Diagnosis not present

## 2023-06-24 DIAGNOSIS — Z9181 History of falling: Secondary | ICD-10-CM | POA: Diagnosis not present

## 2023-06-24 DIAGNOSIS — D696 Thrombocytopenia, unspecified: Secondary | ICD-10-CM | POA: Diagnosis not present

## 2023-06-24 DIAGNOSIS — I509 Heart failure, unspecified: Secondary | ICD-10-CM | POA: Diagnosis not present

## 2023-06-24 DIAGNOSIS — I5033 Acute on chronic diastolic (congestive) heart failure: Secondary | ICD-10-CM | POA: Diagnosis not present

## 2023-06-24 DIAGNOSIS — Z452 Encounter for adjustment and management of vascular access device: Secondary | ICD-10-CM | POA: Diagnosis not present

## 2023-06-24 DIAGNOSIS — I058 Other rheumatic mitral valve diseases: Secondary | ICD-10-CM | POA: Diagnosis not present

## 2023-06-24 DIAGNOSIS — I4892 Unspecified atrial flutter: Secondary | ICD-10-CM | POA: Diagnosis not present

## 2023-06-24 DIAGNOSIS — I480 Paroxysmal atrial fibrillation: Secondary | ICD-10-CM | POA: Diagnosis not present

## 2023-06-24 LAB — CBC
HCT: 28.6 % — ABNORMAL LOW (ref 36.0–46.0)
Hemoglobin: 8.8 g/dL — ABNORMAL LOW (ref 12.0–15.0)
MCH: 26.2 pg (ref 26.0–34.0)
MCHC: 30.8 g/dL (ref 30.0–36.0)
MCV: 85.1 fL (ref 80.0–100.0)
Platelets: 65 10*3/uL — ABNORMAL LOW (ref 150–400)
RBC: 3.36 MIL/uL — ABNORMAL LOW (ref 3.87–5.11)
RDW: 18.3 % — ABNORMAL HIGH (ref 11.5–15.5)
WBC: 17.6 10*3/uL — ABNORMAL HIGH (ref 4.0–10.5)
nRBC: 1.4 % — ABNORMAL HIGH (ref 0.0–0.2)

## 2023-06-24 LAB — HEPATIC FUNCTION PANEL
ALT: 203 U/L — ABNORMAL HIGH (ref 0–44)
AST: 289 U/L — ABNORMAL HIGH (ref 15–41)
Albumin: 2.6 g/dL — ABNORMAL LOW (ref 3.5–5.0)
Alkaline Phosphatase: 179 U/L — ABNORMAL HIGH (ref 38–126)
Bilirubin, Direct: 0.7 mg/dL — ABNORMAL HIGH (ref 0.0–0.2)
Indirect Bilirubin: 1 mg/dL — ABNORMAL HIGH (ref 0.3–0.9)
Total Bilirubin: 1.7 mg/dL — ABNORMAL HIGH (ref 0.0–1.2)
Total Protein: 6.8 g/dL (ref 6.5–8.1)

## 2023-06-24 LAB — CK: Total CK: 45 U/L (ref 38–234)

## 2023-06-24 LAB — CREATININE, SERUM
Creatinine, Ser: 1.62 mg/dL — ABNORMAL HIGH (ref 0.44–1.00)
GFR, Estimated: 34 mL/min — ABNORMAL LOW (ref >60)

## 2023-06-24 MED ORDER — PREDNISONE 20 MG PO TABS
20.0000 mg | ORAL_TABLET | Freq: Every day | ORAL | Status: DC
Start: 2023-06-25 — End: 2023-06-27
  Administered 2023-06-25 – 2023-06-27 (×3): 20 mg via ORAL
  Filled 2023-06-24 (×3): qty 1

## 2023-06-24 MED ORDER — SUCRALFATE 1 GM/10ML PO SUSP
1.0000 g | Freq: Three times a day (TID) | ORAL | Status: DC
  Administered 2023-06-24 – 2023-06-27 (×12): 1 g via ORAL
  Filled 2023-06-24 (×14): qty 10

## 2023-06-24 NOTE — Progress Notes (Addendum)
Progress Note   Patient: Tina Peters WJX:[id number] DOB: 26-Apr-1951 DOA: 06/20/2023     4 DOS: the patient was seen and examined on 06/24/2023   Brief hospital course: 73 y.o. female with medical history significant of A-fib on Eliquis, hypertension, CKD stage IIIa, prior history of stroke, dCHF, hypertension, HLD, depression with anxiety, s/p bioprosthetic AVR in 10/2021, recent admission due to endocarditis on Rocephin via PICC line, lumbar radiculopathy, who presents with shortness breath, chest pain, abdominal pain.   Patient was recently hospitalized from 2/3 - 2/11 due to mitral valve endocarditis and Gemella bacteremia. TEE 02/05 showing mobile opacity on posterior leaflet mitral valve prosthetic valve. Cardiology following, have discussed w/ CT surgery and recs for NO surgery/replacement at this time, favor plan for repeat TEE in 6 weeks to reevaluate mitral valve. Pt is discharged on 6 weeks of IV Rocephin.  PICC line is placed on 2/10.   Pt states that she started having shortness of breath since this morning, which has been progressively worsening.  Patient has cough with clear mucus production.  Also reports left-sided chest pain, no fever or chills.  She has diffused abdominal pain, which is constant, aching, moderate, nonradiating.  She has nausea, dry heaves.  She states that she has some diarrhea recently, which has resolved.  Today no diarrhea.  Denies symptoms of UTI.  She complains of lower back pain.  Of note, patient had MRI spine L and T spine which had no evidence osteo/discitis.    Data reviewed independently and ED Course: pt was found to have BNP 1353, WBC 20.7 (12.5 on 06/17/2023), negative PCR for COVID, flu and RSV, lactic acid 4.8 --> 3.8, temperature normal, blood pressure 118/66, heart rate 90, RR 28, worsening renal function, liver function (ALP 221, AST 102, ALT 43, total bilirubin 2.1.  Patient is admitted to telemetry bed as inpatient.  2/15.  Patient still  complains of pain between the shoulder blades.  Added a muscle relaxant and changed her pain medications over to tramadol.  1 dose of morphine given for severe pain. 2/16.  Patient complaining severe pain in her right foot and right first toe.  Likely gout secondary to diuresis.  Platelet count 111. 2/17.  Right foot pain much better.  Continue Solu-Medrol.  Cardiology decreased Lasix to daily dosing.  Platelet count 77 2/18.  Right foot pain continues to improve.  Continue Solu-Medrol today and switch over to prednisone for tomorrow.  Continue daily IV Lasix for heart failure.  Continue to watch platelets.  Platelet count 65  Assessment and Plan: * Acute on chronic diastolic CHF (congestive heart failure) (HCC) Consistent with right heart failure and pulmonary hypertension.  Echocardiogram showed decreased right ventricular function and normal left ventricular ejection fraction.  Continue IV Lasix daily 40 mg.  Patient on Jardiance.  Cardiology following.  Acute gout Right first toe and right foot secondary to diuresis and kidney impairment.  Pain much better today.  Continue Solu-Medrol today and change to prednisone for tomorrow.  Continue colchicine.  Endocarditis of mitral valve With elevation of white blood cell count, became more aggressive with antibiotics with vancomycin and cefepime on admission.  CT scan did not show pneumonia.  Blood cultures negative for 4 days.  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  ID changed antibiotics back to Rocephin on 2/17.  White blood cell count slowly trending better at 17.6 but patient now on steroids.  Pleural effusion Likely with right-sided heart failure  Elevated lactic acid level  Trended in the right direction.  Acute kidney injury superimposed on CKD [facility]) Patient has baseline creatinine around 1.02.  Had acute kidney injury on last hospitalization.  Creatinine peaked at 1.89.  Creatinine down to 1.62.  Chronic a-fib (HCC) On  Eliquis for anticoagulation and metoprolol for rate control.  Hyperlipidemia On atorvastatin  Essential hypertension Continue Lasix and metoprolol  History of CVA (cerebrovascular accident) On aspirin and atorvastatin  Myocardial injury Demand ischemia from CHF  Abnormal LFTs Could be secondary to infection.  Right upper quadrant shows gallstones but no evidence of cholecystitis.  Hold Lipitor.  OSA (obstructive sleep apnea) CPAP at night  Depression with anxiety Continue Zoloft  Thrombocytopenia (HCC) Platelet count dropped down to 65.  Could be secondary to infection and/or antibiotics.  Protonix discontinued and switched over to Carafate.  Continue to monitor.  Iron deficiency anemia Hemoglobin 8.8 and ferritin 67.  Obesity, Class III, BMI 40-49.9 (morbid obesity) (HCC) BMI now down to 38.89 with diuresis  Hyponatremia Last sodium normal range        Subjective: Patient having pain when trying to walk on her right foot secondary to gout attack.  Patient's back pain is much improved than when she came in.  Patient's breathing is better.  Feels weak.  Admitted with heart failure exacerbation.  Physical Exam: Vitals:   06/24/23 0350 06/24/23 0511 06/24/23 0736 06/24/23 1213  BP: 107/79  (!) 133/93 122/82  Pulse: 87  89 92  Resp: 18  18 19   Temp: 98.1 F (36.7 C)  98 F (36.7 C) 98.6 F (37 C)  TempSrc:      SpO2: 98%  99% 99%  Weight:  106 kg    Height:       Physical Exam HENT:     Head: Normocephalic.  Eyes:     General: Lids are normal.     Conjunctiva/sclera: Conjunctivae normal.  Cardiovascular:     Rate and Rhythm: Normal rate. Rhythm irregularly irregular.     Heart sounds: Normal heart sounds, S1 normal and S2 normal.  Pulmonary:     Breath sounds: Examination of the right-lower field reveals decreased breath sounds. Examination of the left-lower field reveals decreased breath sounds. Decreased breath sounds present. No wheezing, rhonchi or  rales.  Abdominal:     Palpations: Abdomen is soft.     Tenderness: There is no abdominal tenderness.  Musculoskeletal:     Right lower leg: Swelling present.     Left lower leg: Swelling present.     Comments: Pain improved right foot and toe  Skin:    General: Skin is warm.     Findings: No rash.  Neurological:     Mental Status: She is alert and oriented to person, place, and time.     Data Reviewed: Creatinine 1.62, white blood cell count 17.6, hemoglobin 8.8, platelet count 65  Family Communication: Updated patient's daughter on the phone  Disposition: Status is: Inpatient Remains inpatient appropriate because: Continue IV diuresis with Lasix 40 mg IV daily.  Antibiotics changed over to Rocephin yesterday by ID.  Planned Discharge Destination: Home with Home Health    Time spent: 28 minutes  Author: Alford Highland, MD 06/24/2023 2:40 PM  For on call review [email or web address].

## 2023-06-24 NOTE — Plan of Care (Signed)
  Problem: Education: Goal: Knowledge of General Education information will improve Description: Including pain rating scale, medication(s)/side effects and non-pharmacologic comfort measures Outcome: Progressing   Problem: Clinical Measurements: Goal: Respiratory complications will improve Outcome: Progressing   Problem: Nutrition: Goal: Adequate nutrition will be maintained Outcome: Progressing   Problem: Elimination: Goal: Will not experience complications related to bowel motility Outcome: Progressing Goal: Will not experience complications related to urinary retention Outcome: Progressing   Problem: Pain Managment: Goal: General experience of comfort will improve and/or be controlled Outcome: Progressing   Problem: Safety: Goal: Ability to remain free from injury will improve Outcome: Progressing   Problem: Activity: Goal: Ability to tolerate increased activity will improve Outcome: Progressing   Problem: Health Behavior/Discharge Planning: Goal: Ability to manage health-related needs will improve Outcome: Not Progressing   Problem: Coping: Goal: Level of anxiety will decrease Outcome: Not Progressing

## 2023-06-24 NOTE — Plan of Care (Signed)

## 2023-06-24 NOTE — Progress Notes (Signed)
Date of Admission:  06/20/2023      ID: Tina Peters is a 73 y.o. female Principal Problem:   Acute on chronic diastolic CHF (congestive heart failure) (HCC) Active Problems:   Essential hypertension   OSA (obstructive sleep apnea)   History of CVA (cerebrovascular accident)   Hyperlipidemia   S/P MVR (mitral valve replacement)   Chronic a-fib (HCC)   Lumbar radiculopathy   Endocarditis of mitral valve   Acute kidney injury superimposed on CKD (HCC)   Pleural effusion   Elevated lactic acid level   Depression with anxiety   Abnormal LFTs   Myocardial injury   Leukocytosis   Shortness of breath   Pain of upper abdomen   Acute on chronic right-sided heart failure (HCC)   Hyponatremia   Obesity, Class III, BMI 40-49.9 (morbid obesity) (HCC)   Iron deficiency anemia   Acute gout   Chronic renal failure (CRF), stage 3b (HCC)   Thrombocytopenia (HCC) 73 y.o. 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 Had Blood culture positive for gemella,in 1 set of blood culture. TEE showed vegetation on the prosthetic mitral valve. Repeat Blood culture 06/13/23 neg . Cardiac surgery at [facility] was not considering any intervention, and asked to treat her with 6 weeks of IV and then repeat a TEE. WBC was as high as 44 at one time, and declined to 12 on discharge. She had PICC placement on 06/16/23 and was discharged home on 2/11  She Presented to the ED with sob and chest pain and abdominal pain on 06/20/23   Subjective: Feeling better Abdomen not tight  Medications:   acidophilus  1 capsule Oral BID    apixaban  5 mg Oral BID   aspirin EC  81 mg Oral Daily   Chlorhexidine Gluconate Cloth  6 each Topical Daily   colchicine  0.6 mg Oral Daily   empagliflozin  10 mg Oral Daily   furosemide  40 mg Intravenous Daily   metoprolol tartrate  50 mg Oral BID   [START ON 06/25/2023] predniSONE  20 mg Oral Q breakfast   sertraline  50 mg Oral Daily   sodium chloride flush  10-40 mL Intracatheter Q12H   sucralfate  1 g Oral TID WC & HS    Objective: Vital signs in last 24 hours: Patient Vitals for the past 24 hrs:  BP Temp Temp src Pulse Resp SpO2 Weight  06/24/23 1213 122/82 98.6 F (37 C) -- 92 19 99 % --  06/24/23 0736 (!) 133/93 98 F (36.7 C) -- 89 18 99 % --  06/24/23 0511 -- -- -- -- -- -- 106 kg  06/24/23 0350 107/79 98.1 F (36.7 C) -- 87 18 98 % --  06/23/23 2307 (!) 124/94 98.3 F (36.8 C) -- 75 18 98 % --  06/23/23 1916 121/67 98.1 F (36.7 C) -- 75 18 99 % --  06/23/23 1628 120/71 97.9 F (36.6 C) Oral 80 18 99 % --     LDA Rt PICC  PHYSICAL EXAM:  General: Alert, cooperative, no distress, appears stated age.  Head: Normocephalic, without obvious abnormality, atraumatic. Eyes: Conjunctivae clear, anicteric sclerae.  Pupils are equal ENT Nares normal. No drainage or sinus tenderness. Lips, mucosa, and tongue normal. No Thrush Neck: Supple, symmetrical, no adenopathy, thyroid: non tender no carotid bruit and no JVD. Back: No CVA tenderness. Lungs: b/l air entry Heart: irregular Abdomen: Soft, non-tender,not distended. Bowel sounds normal. No masses Extremities: rt foot- 3/4 toe area tender Skin: No rashes or lesions. Or bruising Lymph: Cervical, supraclavicular normal. Neurologic: Grossly non-focal  Lab Results    Latest Ref Rng & Units 06/24/2023    5:16 AM 06/23/2023    5:31 AM 06/22/2023    5:24 AM  CBC  WBC 4.0 - 10.5 K/uL 17.6  18.0  19.0   Hemoglobin 12.0 - 15.0 g/dL 8.8  8.8  8.5   Hematocrit 36.0 - 46.0 % 28.6  28.3  26.9   Platelets 150 - 400 K/uL 65   77  111        Latest Ref Rng & Units 06/24/2023    5:16 AM 06/23/2023    5:31 AM 06/23/2023    5:30 AM  CMP  Glucose 70 - 99 mg/dL  540    BUN 8 - 23 mg/dL  37    Creatinine 9.81 - 1.00 mg/dL 1.91  4.78    Sodium 295 - 145 mmol/L  135    Potassium 3.5 - 5.1 mmol/L  4.0    Chloride 98 - 111 mmol/L  103    CO2 22 - 32 mmol/L  23    Calcium 8.9 - 10.3 mg/dL  8.2    Total Protein 6.5 - 8.1 g/dL   7.1   Total Bilirubin 0.0 - 1.2 mg/dL   1.8   Alkaline Phos 38 - 126 U/L   180   AST 15 - 41 U/L   651   ALT 0 - 44 U/L   272       Microbiology: 06/20/23 BC NG    Assessment/Plan: Acute on chronic CHF - presenting with SOB /tightness  Rt heart failure Increase in Pulmonary artery pressure Being treated with diuretics   Abnormal LFTS could be from Hepatic congestionsobstructing stone which may have passed As GB cholelithiasis and worsening LFT ? 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Wieting

## 2023-06-24 NOTE — Progress Notes (Signed)
Rounding Note    Patient Name: Tina Peters Date of Encounter: 06/24/2023  [facility] Cardiologist: Lorine Bears, MD   Subjective   Did not sleep well. No chest pain or palpitations. Dyspnea improving. Documented UOP 400 mL for the past 24 hours, net - 1.9 L for the admission. Renal function improved on lower dose IV Lasix 40 mg daily. Tolerating SGLT2i. Hgb stable.  Gout improving.  No chest pain or palpitations.  Inpatient Medications    Scheduled Meds:  acidophilus  1 capsule Oral BID   apixaban  5 mg Oral BID   aspirin EC  81 mg Oral Daily   atorvastatin  40 mg Oral QHS   Chlorhexidine Gluconate Cloth  6 each Topical Daily   colchicine  0.6 mg Oral Daily   empagliflozin  10 mg Oral Daily   furosemide  40 mg Intravenous Daily   methylPREDNISolone (SOLU-MEDROL) injection  40 mg Intravenous Daily   metoprolol tartrate  50 mg Oral BID   sertraline  50 mg Oral Daily   sodium chloride flush  10-40 mL Intracatheter Q12H   sucralfate  1 g Oral TID WC & HS   Continuous Infusions:  cefTRIAXone (ROCEPHIN)  IV 2 g (06/24/23 1008)   PRN Meds: acetaminophen, albuterol, dextromethorphan-guaiFENesin, diphenhydrAMINE, hydrALAZINE, methocarbamol, nitroGLYCERIN, ondansetron (ZOFRAN) IV, sodium chloride flush, traMADol   Vital Signs    Vitals:   06/23/23 2307 06/24/23 0350 06/24/23 0511 06/24/23 0736  BP: (!) 124/94 107/79  (!) 133/93  Pulse: 75 87  89  Resp: 18 18  18   Temp: 98.3 F (36.8 C) 98.1 F (36.7 C)  98 F (36.7 C)  TempSrc:      SpO2: 98% 98%  99%  Weight:   106 kg   Height:        Intake/Output Summary (Last 24 hours) at 06/24/2023 1113 Last data filed at 06/24/2023 0301 Gross per 24 hour  Intake 417.96 ml  Output 400 ml  Net 17.96 ml      06/24/2023    5:11 AM 06/23/2023    8:03 AM 06/21/2023    5:18 PM  Last 3 Weights  Weight (lbs) 233 lb 11 oz 230 lb 11.2 oz 241 lb 10 oz  Weight (kg) 106 kg 104.645 kg 109.6 kg      Telemetry     Atrial fibrillation rates 80s to 90s bpm - Personally Reviewed  ECG    No new tracings - Personally Reviewed  Physical Exam   GEN: No acute distress Neck: unable to determine JVD due to body habitus Cardiac: IRIR, no murmurs, rubs, or gallops  Respiratory: Improving breath sounds bilaterally with mildly diminished breath sounds along the bases bilaterally. Respirations are unlabored at rest on room air GI: Soft, nontender, obese, non-distended  MS: Trivial pretibial edema Neuro:  Nonfocal  Psych: Normal affect   Labs    High Sensitivity Troponin:   Recent Labs  Lab 06/10/23 0053 06/10/23 0731 06/10/23 1531 06/20/23 1400 06/20/23 1808  TROPONINIHS 331* 362* 228* 105* 107*     Chemistry Recent Labs  Lab 06/20/23 1401 06/20/23 1808 06/21/23 0429 06/22/23 0440 06/23/23 0530 06/23/23 0531 06/24/23 0516  NA 135  --  134* 134*  --  135  --   K 5.1  --  5.0 4.2  --  4.0  --   CL 105  --  103 100  --  103  --   CO2 17*  --  16* 23  --  23  --   GLUCOSE 117*  --  102* 116*  --  172*  --   BUN 26*  --  31* 40*  --  37*  --   CREATININE 1.53*  --  1.63* 1.89*  --  1.70* 1.62*  CALCIUM 8.6*  --  8.5* 8.2*  --  8.2*  --   MG  --  1.8  --   --   --   --   --   PROT 7.2  --  6.8  --  7.1  --   --   ALBUMIN 2.9*  --  2.8*  --  2.7*  --   --   AST 102*  --  183*  --  651*  --   --   ALT 43  --  71*  --  272*  --   --   ALKPHOS 221*  --  192*  --  180*  --   --   BILITOT 2.1*  --  2.9*  --  1.8*  --   --   GFRNONAA 36*  --  33* 28*  --  32* 34*  ANIONGAP 13  --  15 11  --  9  --     Lipids No results for input(s): "CHOL", "TRIG", "HDL", "LABVLDL", "LDLCALC", "CHOLHDL" in the last 168 hours.  Hematology Recent Labs  Lab 06/22/23 0524 06/23/23 0531 06/24/23 0516  WBC 19.0* 18.0* 17.6*  RBC 3.21* 3.36* 3.36*  HGB 8.5* 8.8* 8.8*  HCT 26.9* 28.3* 28.6*  MCV 83.8 84.2 85.1  MCH 26.5 26.2 26.2  MCHC 31.6 31.1 30.8  RDW 18.1* 18.2* 18.3*  PLT 111* 77* 65*   Thyroid No  results for input(s): "TSH", "FREET4" in the last 168 hours.  BNP Recent Labs  Lab 06/20/23 1400  BNP 1,353.9*    DDimer No results for input(s): "DDIMER" in the last 168 hours.   Radiology    US Abdomen Limited RUQ (LIVER/GB) Result Date: 06/21/2023 IMPRESSION: 1. Cholelithiasis without findings of acute cholecystitis. 2. Right pleural effusion. Electronically Signed   By: Gaylyn Rong M.D.   On: 06/21/2023 10:06    Cardiac Studies  Limited 2 D echo 06/21/2023  1. Left ventricular ejection fraction, by estimation, is 55 to 60%. The  left ventricle has normal function. The left ventricle has no regional  wall motion abnormalities.   2. Right ventricular systolic function is moderately reduced. The right  ventricular size is moderately enlarged. Tricuspid regurgitation signal is  inadequate for assessing PA pressure.   3. Mitral valve mean gradient not measured.. The mitral valve has been  repaired/replaced. Mild mitral valve regurgitation. Small mobile opacity  again noted on leaflets concerning for valve vegetation.   4. The aortic valve is normal in structure. There is mild calcification  of the aortic valve. Aortic valve regurgitation is not visualized. Aortic  valve sclerosis is present, with no evidence of aortic valve stenosis.   Patient Profile     73 y.o. female with a past medical history of paroxysmal atrial fibrillation/flutter, tachybradycardia syndrome, PSVT, hypertension, hyperlipidemia, stroke, obstructive sleep apnea on CPAP, CKD 3, endocarditis status post MVR, asthma, tobacco abuse, chronic back pain, and diverticulosis, who is being seen and evaluated for heart failure exacerbation.  Assessment & Plan    Acute on chronic HFpEF exacerbation -Presented with worsening shortness of breath and swelling -BNP 1353 -Vascular congestion on chest x-ray with pleural effusions -Echocardiogram reveals an LVEF of 55-60% -Volume status  improving -Ins and outs  incomplete  -Continue lower dose IV Lasix 40 mg daily -Now on Farxiga 10 mg daily -Not on MRA with acute on CKD -Heart failure education -Fluid restriction -Daily weights, I's and O's, low-sodium diet  Bioprosthetic mitral valve endocarditis -Previously hospitalized with TEE completed with vegetation found on the valve, discharged with PICC line and continued antibiotic therapy with repeat TTE recommended in 6 weeks -CT surgery Dr. Leafy Ro recommended conservative management -Outpatient follow-up with TTE to be arranged -Limited study completed revealed an LVEF 55 to 60%, no RWMA, with a small mobile opacity again noted on the leaflets concerning for valve vegetation with mild mitral regurgitation -Antibiotics continued per IM -Followed by ID  Paroxysmal atrial fibrillation/flutter -Review of telemetry reveals she is back in atrial flutter that is rate controlled -Previously evaluated by EP with patient declining rhythm control at that time, in this setting amiodarone was stopped on 2/16 -With heart failure, may need to reconsider rhythm control strategy, outpatient follow up with EP -Remains on apixaban 5 mg twice daily for CHA2DS2-VASc of at least 7 for stroke prophylaxis as well as metoprolol tartrate 50 mg twice daily -Continued on telemetry monitoring  Acute on CKD stage III -Serum creatinine improving  -Baseline creatinine 1.0 -Serum creatinine on admission was 1.6 -Monitor urine output -Monitor/trend/replete electrolytes as needed -Daily BMP -Avoid nephrotoxic agents were able  Hypertension -Blood pressure stable -Pharmacotherapy as above  Hyperlipidemia -Continued on atorvastatin  Demand ischemia -High-sensitivity troponin trended 105 to 107, flat not consistent with ACS -Chest CT negative for PE -Echocardiogram revealed an LVEF of 55-60%, no RWMA -Continued on aspirin 81 mg daily and atorvastatin -EKG as needed for pain or changes -No plan for ischemic workup       For questions or updates, please contact [facility] Please consult [email or web address] for contact info under        Signed, Eula Listen, PA-C  06/24/2023, 11:13 AM

## 2023-06-24 NOTE — Progress Notes (Signed)
Physical Therapy Treatment Patient Details Name: Tina Peters MRN: [id number] DOB: Feb 25, 1951 Today's Date: 06/24/2023   History of Present Illness Pt admitted for acute on chronic CHF. Pt with history including Afib, HTN, HLD, CVA and CKD stage 3. Pt recently admitted for similar symptoms.    PT Comments  Patient reports less right great toe pain today. She maintains heel weight bearing for comfort with RLE with ambulating short distance using rolling walker in room. Activity tolerance limited by fatigue. Mild dizziness with upright activity. Recommend to continue PT to maximize independence and facilitate return to prior level of function.    If plan is discharge home, recommend the following: A little help with walking and/or transfers;A little help with bathing/dressing/bathroom;Assistance with cooking/housework;Assist for transportation;Help with stairs or ramp for entrance   Can travel by private vehicle        Equipment Recommendations  None recommended by PT    Recommendations for Other Services       Precautions / Restrictions Precautions Precautions: Fall Recall of Precautions/Restrictions: Intact Restrictions Weight Bearing Restrictions Per Provider Order: No     Mobility  Bed Mobility Overal bed mobility: Modified Independent                  Transfers Overall transfer level: Needs assistance Equipment used: Rolling walker (2 wheels) Transfers: Sit to/from Stand Sit to Stand: Contact guard assist           General transfer comment: 2 standing bouts performed, cues for technique    Ambulation/Gait Ambulation/Gait assistance: Contact guard assist Gait Distance (Feet): 2 Feet Assistive device: Rolling walker (2 wheels) Gait Pattern/deviations: Step-to pattern Gait velocity: decreased     General Gait Details: weight bearing only on R heel for comfort due to pain in right great toe. cues provided for safety using rolling walker. mild dizziness  reported with upright activity   Stairs             Wheelchair Mobility     Tilt Bed    Modified Rankin (Stroke Patients Only)       Balance Overall balance assessment: Needs assistance Sitting-balance support: No upper extremity supported Sitting balance-Leahy Scale: Good     Standing balance support: Bilateral upper extremity supported Standing balance-Leahy Scale: Fair                              Hotel manager: No apparent difficulties  Cognition Arousal: Alert Behavior During Therapy: WFL for tasks assessed/performed   PT - Cognitive impairments: No apparent impairments                         Following commands: Intact      Cueing Cueing Techniques: Verbal cues  Exercises      General Comments General comments (skin integrity, edema, etc.): patient requesting to sit up in the chair for lunch      Pertinent Vitals/Pain Pain Assessment Pain Assessment: Faces Faces Pain Scale: Hurts little more Pain Location: right toe Pain Descriptors / Indicators: Discomfort Pain Intervention(s): Limited activity within patient's tolerance, Monitored during session    Home Living                          Prior Function            PT Goals (current goals can now be found in the care plan  section) Acute Rehab PT Goals Patient Stated Goal: to get better and go home PT Goal Formulation: With patient Time For Goal Achievement: 06/27/23 Potential to Achieve Goals: Good Progress towards PT goals: Progressing toward goals    Frequency    Min 1X/week      PT Plan      Co-evaluation              AM-PAC PT "6 Clicks" Mobility   Outcome Measure  Help needed turning from your back to your side while in a flat bed without using bedrails?: A Little Help needed moving from lying on your back to sitting on the side of a flat bed without using bedrails?: A Little Help needed moving to and  from a bed to a chair (including a wheelchair)?: A Little Help needed standing up from a chair using your arms (e.g., wheelchair or bedside chair)?: A Little Help needed to walk in hospital room?: A Little Help needed climbing 3-5 steps with a railing? : A Little 6 Click Score: 18    End of Session   Activity Tolerance: Patient tolerated treatment well Patient left: in bed;with call bell/phone within reach Nurse Communication: Mobility status PT Visit Diagnosis: Unsteadiness on feet (R26.81);Muscle weakness (generalized) (M62.81);Other abnormalities of gait and mobility (R26.89)     Time: 0865-7846 PT Time Calculation (min) (ACUTE ONLY): 28 min  Charges:    $Therapeutic Activity: 23-37 mins PT General Charges $$ ACUTE PT VISIT: 1 Visit                    Donna Bernard, PT, MPT    Ina Homes 06/24/2023, 3:23 PM

## 2023-06-24 NOTE — Consult Note (Signed)
[facility] Liaison Consult Note    06/24/2023  Tina Peters 1951/02/05 [id number]  Insurance: [employer] Medicare   Primary Care Provider: Erasmo Downer, MD with [facility], this provider is listed for the transition of care follow up appointments  and [facility] calls   Coverage for Elliot Cousin for patient at [facility] Liaison screen patient admitted to [facility].   Call attempts made to bedside phone with operator assistance was successful. Patient provided HIPAA and verified phone number she states she is planning to go to her daughter's house, [name], and her daughter has her call phone, number verified, for appointment.  She prefers appointments on Wednesday and Fridays. The patient was generous and accepting.   The patient was screened for 7 and 30 day readmission hospitalization with noted extreme risk score for unplanned readmission risk 2 ED and 2 hospital admissions in 6 months.  The patient was assessed for potential Community Care Coordination service needs for post hospital transition for care coordination. Review of patient's electronic medical record reveals patient is being recommended for home with [facility]. Marland Kitchen   Plan: [facility] Liaison will continue to follow progress and disposition to asess for post hospital community care coordination/management needs.  Referral request for community care coordination: Continue to follow up. Current plan for home with [facility] noted per Inpatient TOC team.   [facility] Community Care, Population Health does not replace or interfere with any arrangements made by the Inpatient Transition of Care team.   For questions contact:   Charlesetta Shanks, RN, BSN, CCM [place]  [facility], [facility] Liaison Direct Dial: [phone] or secure chat Email: [email or web address]

## 2023-06-24 NOTE — Progress Notes (Signed)
PHARMACY CONSULT NOTE FOR:  OUTPATIENT  PARENTERAL ANTIBIOTIC THERAPY (OPAT) - Readmit so placing note again,  not changes in dose or stop date.    Indication: Bacteremia with prosthetic mitral valve endocarditis  Regimen: Ceftriaxone 2 grams IV every 24 hours  End date: 07/22/2023 (6 weeks)  -Labs - Once weekly:  CBC/D, CMP, ESR and CRP -Fax weekly lab results  promptly to [phone] -Call [phone] twith any questions or critical value    IV antibiotic discharge orders are pended. To discharging provider:  please sign these orders via discharge navigator,  Select New Orders & click on the button choice - Manage This Unsigned Work.     Thank you for allowing pharmacy to be a part of this patient's care.  Juliette Alcide, PharmD, BCPS, BCIDP Work Cell: [phone] 06/24/2023 9:33 AM

## 2023-06-24 NOTE — Telephone Encounter (Signed)
[facility]HA requested certification paperwork, front office portion completed and placed in provider mailbox

## 2023-06-25 ENCOUNTER — Other Ambulatory Visit (HOSPITAL_COMMUNITY): Payer: Self-pay

## 2023-06-25 ENCOUNTER — Telehealth (HOSPITAL_COMMUNITY): Payer: Self-pay | Admitting: Pharmacy Technician

## 2023-06-25 DIAGNOSIS — I059 Rheumatic mitral valve disease, unspecified: Secondary | ICD-10-CM | POA: Diagnosis not present

## 2023-06-25 DIAGNOSIS — K5790 Diverticulosis of intestine, part unspecified, without perforation or abscess without bleeding: Secondary | ICD-10-CM

## 2023-06-25 DIAGNOSIS — D72829 Elevated white blood cell count, unspecified: Secondary | ICD-10-CM | POA: Diagnosis not present

## 2023-06-25 DIAGNOSIS — I10 Essential (primary) hypertension: Secondary | ICD-10-CM | POA: Diagnosis not present

## 2023-06-25 DIAGNOSIS — I509 Heart failure, unspecified: Secondary | ICD-10-CM | POA: Diagnosis not present

## 2023-06-25 DIAGNOSIS — D509 Iron deficiency anemia, unspecified: Secondary | ICD-10-CM

## 2023-06-25 DIAGNOSIS — I483 Typical atrial flutter: Secondary | ICD-10-CM | POA: Diagnosis not present

## 2023-06-25 DIAGNOSIS — I50813 Acute on chronic right heart failure: Secondary | ICD-10-CM | POA: Diagnosis not present

## 2023-06-25 DIAGNOSIS — N1832 Chronic kidney disease, stage 3b: Secondary | ICD-10-CM

## 2023-06-25 DIAGNOSIS — I058 Other rheumatic mitral valve diseases: Secondary | ICD-10-CM | POA: Diagnosis not present

## 2023-06-25 DIAGNOSIS — J90 Pleural effusion, not elsewhere classified: Secondary | ICD-10-CM | POA: Diagnosis not present

## 2023-06-25 DIAGNOSIS — R0602 Shortness of breath: Secondary | ICD-10-CM

## 2023-06-25 DIAGNOSIS — R7989 Other specified abnormal findings of blood chemistry: Secondary | ICD-10-CM | POA: Diagnosis not present

## 2023-06-25 DIAGNOSIS — I272 Pulmonary hypertension, unspecified: Secondary | ICD-10-CM | POA: Diagnosis not present

## 2023-06-25 LAB — COMPREHENSIVE METABOLIC PANEL
ALT: 171 U/L — ABNORMAL HIGH (ref 0–44)
AST: 166 U/L — ABNORMAL HIGH (ref 15–41)
Albumin: 2.9 g/dL — ABNORMAL LOW (ref 3.5–5.0)
Alkaline Phosphatase: 204 U/L — ABNORMAL HIGH (ref 38–126)
Anion gap: 10 (ref 5–15)
BUN: 45 mg/dL — ABNORMAL HIGH (ref 8–23)
CO2: 23 mmol/L (ref 22–32)
Calcium: 8.4 mg/dL — ABNORMAL LOW (ref 8.9–10.3)
Chloride: 100 mmol/L (ref 98–111)
Creatinine, Ser: 1.68 mg/dL — ABNORMAL HIGH (ref 0.44–1.00)
GFR, Estimated: 32 mL/min — ABNORMAL LOW (ref >60)
Glucose, Bld: 214 mg/dL — ABNORMAL HIGH (ref 70–99)
Potassium: 4.2 mmol/L (ref 3.5–5.1)
Sodium: 133 mmol/L — ABNORMAL LOW (ref 135–145)
Total Bilirubin: 1.7 mg/dL — ABNORMAL HIGH (ref 0.0–1.2)
Total Protein: 7.6 g/dL (ref 6.5–8.1)

## 2023-06-25 LAB — CULTURE, BLOOD (ROUTINE X 2)
Culture: NO GROWTH
Culture: NO GROWTH

## 2023-06-25 LAB — CBC WITH DIFFERENTIAL/PLATELET
Abs Immature Granulocytes: 0.21 10*3/uL — ABNORMAL HIGH (ref 0.00–0.07)
Basophils Absolute: 0 10*3/uL (ref 0.0–0.1)
Basophils Relative: 0 %
Eosinophils Absolute: 0 10*3/uL (ref 0.0–0.5)
Eosinophils Relative: 0 %
HCT: 27.6 % — ABNORMAL LOW (ref 36.0–46.0)
Hemoglobin: 8.6 g/dL — ABNORMAL LOW (ref 12.0–15.0)
Immature Granulocytes: 1 %
Lymphocytes Relative: 5 %
Lymphs Abs: 0.7 10*3/uL (ref 0.7–4.0)
MCH: 25.7 pg — ABNORMAL LOW (ref 26.0–34.0)
MCHC: 31.2 g/dL (ref 30.0–36.0)
MCV: 82.4 fL (ref 80.0–100.0)
Monocytes Absolute: 1 10*3/uL (ref 0.1–1.0)
Monocytes Relative: 7 %
Neutro Abs: 12.7 10*3/uL — ABNORMAL HIGH (ref 1.7–7.7)
Neutrophils Relative %: 87 %
Platelets: 93 10*3/uL — ABNORMAL LOW (ref 150–400)
RBC: 3.35 MIL/uL — ABNORMAL LOW (ref 3.87–5.11)
RDW: 18.4 % — ABNORMAL HIGH (ref 11.5–15.5)
Smear Review: NORMAL
WBC: 14.6 10*3/uL — ABNORMAL HIGH (ref 4.0–10.5)
nRBC: 2.6 % — ABNORMAL HIGH (ref 0.0–0.2)

## 2023-06-25 LAB — PROTIME-INR
INR: 2.1 — ABNORMAL HIGH (ref 0.8–1.2)
Prothrombin Time: 24 s — ABNORMAL HIGH (ref 11.4–15.2)

## 2023-06-25 LAB — LACTATE DEHYDROGENASE: LDH: 707 U/L — ABNORMAL HIGH (ref 98–192)

## 2023-06-25 LAB — FIBRINOGEN: Fibrinogen: 243 mg/dL (ref 210–475)

## 2023-06-25 MED ORDER — FUROSEMIDE 40 MG PO TABS
40.0000 mg | ORAL_TABLET | Freq: Every day | ORAL | Status: DC
  Administered 2023-06-26 – 2023-06-27 (×2): 40 mg via ORAL
  Filled 2023-06-25 (×2): qty 1

## 2023-06-25 NOTE — Telephone Encounter (Signed)
Patient Product/process development scientist completed.    The patient is insured through [employer]. Patient has Medicare and is not eligible for a copay card, but may be able to apply for patient assistance or Medicare RX Payment Plan (Patient Must reach out to their plan, if eligible for payment plan), if available.    Ran test claim for Farxiga 10 mg and the current 30 day co-pay is $0.00.  Ran test claim for Jardiance 10 mg and the current 30 day co-pay is $0.00.  This test claim was processed through [facility]- copay amounts may vary at other pharmacies due to pharmacy/plan contracts, or as the patient moves through the different stages of their insurance plan.     Roland Earl, CPHT Pharmacy Technician III Certified Patient Advocate [facility] Patient Advocate Team Direct Number: [phone]  Fax: [phone]

## 2023-06-25 NOTE — Progress Notes (Signed)
PROGRESS NOTE    Tina Peters   NWG:[id number] DOB: 1950-11-30  DOA: 06/20/2023 Date of Service: 06/25/23 which is hospital day 5  PCP: Erasmo Downer, MD    Hospital course / significant events:   HPI: 73 y.o. female with medical history significant of A-fib on Eliquis, hypertension, CKD stage IIIa, prior history of stroke, dCHF, hypertension, HLD, depression with anxiety, s/p bioprosthetic AVR in 10/2021, recent admission due to endocarditis on Rocephin via PICC line, lumbar radiculopathy, who presents with shortness breath, chest pain, abdominal pain.  Patient was recently hospitalized from 2/3 - 2/11 due to mitral valve endocarditis and Gemella bacteremia. TEE 02/05 showing mobile opacity on posterior leaflet mitral valve prosthetic valve. Cardiology following, have discussed w/ CT surgery and recs for NO surgery/replacement at this time, favor plan for repeat TEE in 6 weeks to reevaluate mitral valve. Pt is discharged on 6 weeks of IV Rocephin.  PICC line is placed on 2/10.   Pt states that she started having shortness of breath since this morning, which has been progressively worsening.  Patient has cough with clear mucus production.  Also reports left-sided chest pain, no fever or chills.  She has diffused abdominal pain, which is constant, aching, moderate, nonradiating.  She has nausea, dry heaves.  She states that she has some diarrhea recently, which has resolved.  Today no diarrhea.  Denies symptoms of UTI.  She complains of lower back pain.  Of note, patient had MRI spine L and T spine which had no evidence osteo/discitis.   02/14: pt was found to have BNP 1353, WBC 20.7 (12.5 on 06/17/2023), negative PCR for COVID, flu and RSV, lactic acid 4.8 --> 3.8, temperature normal, blood pressure 118/66, heart rate 90, RR 28, worsening renal function, liver function (ALP 221, AST 102, ALT 43, total bilirubin 2.1.  Patient is admitted to telemetry bed as inpatient. 02/15.  Patient  still complains of pain between the shoulder blades.  Added a muscle relaxant and changed her pain medications over to tramadol.  1 dose of morphine given for severe pain. Echo today - EF 55-60%, normal LV fxn, mod reduced RV fxn, no new valve abn, persistent mobile opacity on mitral valve c/w known vegetation  02/16.  Patient complaining severe pain in her right foot and right first toe.  Likely gout secondary to diuresis.  Platelet count 111. 02/17.  Right foot pain much better.  Continue Solu-Medrol.  Cardiology decreased Lasix to daily dosing.  Platelet count 77 02/18.  Right foot pain continues to improve.  Continue Solu-Medrol today and switch over to prednisone for tomorrow.  Continue daily IV Lasix for heart failure.  Continue to watch platelets.  Platelet count 65 02/19: on po lasix and improving.      Consultants:  Cardiology Infectious Disease   Procedures/Surgeries: none      ASSESSMENT & PLAN:   Acute on chronic diastolic CHF (congestive heart failure) (HCC) Consistent with right heart failure and pulmonary hypertension.  Echocardiogram showed decreased right ventricular function and normal left ventricular ejection fraction.   Continue IV Lasix daily 40 mg.   Jardiance.   Cardiology following.   Acute gout Right first toe and right foot secondary to diuresis and kidney impairment.  Pain much better today.   Solu-Medrol changed to prednisone today Continue colchicine.   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  CT scan did not show pneumonia.  Blood cultures negative for 4 days.  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   ID changed antibiotics back to Rocephin on 2/17.   White blood cell count slowly trending better at 17.6 but patient now on  steroids. On IV ceftriaxone for 6 weeks until 07/22/23 MRI thoracic and lumbar spine neg last admission - May need MRI with contrast if pain persist and leucocytosis persist  Pleural effusion Likely with right-sided heart failure Repeat imaging if deteriorating respiratory status    Elevated lactic acid level Trended in the right direction. Monitor as needed   Acute kidney injury superimposed on CKD3b Patient has baseline creatinine around 1.02.  Had acute kidney injury on last hospitalization.  Creatinine peaked at 1.89.  Creatinine down to 1.62. Trend BMP   Chronic a-fib (HCC) On Eliquis for anticoagulation and metoprolol for rate control. if platelets drop below 50,000, would favor discontinuation of apixaban    Hyperlipidemia On atorvastatin - held d/t abnormal LFT   Essential hypertension Continue Lasix and metoprolol   History of CVA (cerebrovascular accident) On aspirin  Held atorvastatin d/t abnormal LFT    Myocardial injury Demand ischemia from CHF Repeat troponin/EKG prn chest pain    Abnormal LFTs Could be secondary to infection.   Right upper quadrant shows gallstones but no evidence of cholecystitis. Biliary sludge due to ceftriaxone causing this picture is remotely possible but very unlikely   Hold Lipitor. Trend CMP Consider MRCP?   OSA (obstructive sleep apnea) CPAP at night   Depression with anxiety Continue Zoloft   Thrombocytopenia  Platelet count dropped down to 65.  Could be secondary to infection and/or antibiotics.   Protonix discontinued and switched over to Carafate.   Continue to monitor. if platelets drop below 50,000, would favor discontinuation of apixaban    Iron deficiency anemia Hemoglobin 8.8 and ferritin 67. Monitor CBC   Obesity, Class III, BMI 40-49.9 (morbid obesity) overestimated d/t fluid weight  BMI now down to 38.89 with diuresis   Hyponatremia Last sodium normal range Monitor BMP         Class 2 obesity based  on BMI: Body mass index is 37.75 kg/m.  Underweight - under 18  overweight - 25 to 29 obese - 30 or more Class 1 obesity: BMI of 30.0 to 34 Class 2 obesity: BMI of 35.0 to 39 Class 3 obesity: BMI of 40.0 to 49 Super Morbid Obesity: BMI 50-59 Super-super Morbid Obesity: BMI 60+ Significantly low or high BMI is associated with higher medical risk.  Weight management advised as adjunct to other disease management and risk reduction treatments    DVT prophylaxis: Eliquis but if platelets drop below 50,000, would favor discontinuation IV fluids: no continuous IV fluids  Nutrition: cardiac diet  Central lines / invasive devices: PICC line   Code Status: FULL CODE ACP documentation reviewed:  none on file in [facility]  TOC needs: home health Barriers to dispo / significant pending items: cardiology clearance prior to d/c - possible can go home tomorrow if continues to do well on po diuretics              Subjective / Brief ROS:  Patient reports feeling okay today, still significant fatigue and some SOB / fairly minimal activity. NO chest pain  Pain controlled. Knee pain improving.  Denies new weakness.  Tolerating diet.  Reports no concerns w/ urination/defecation.   Family Communication: none at bedside  Objective Findings:  Vitals:   06/25/23 0418 06/25/23 0633 06/25/23 0837 06/25/23 1316  BP: 131/81  (!) 133/122 128/77  Pulse: 79  81 89  Resp: 20   19  Temp: (!) 97.4 F (36.3 C)  (!) 97.4 F (36.3 C) 98.1 F (36.7 C)  TempSrc:   Oral Oral  SpO2: 98%  100% 95%  Weight:  102.9 kg    Height:        Intake/Output Summary (Last 24 hours) at 06/25/2023 1433 Last data filed at 06/25/2023 1305 Gross per 24 hour  Intake 180 ml  Output 1177 ml  Net -997 ml   Filed Weights   06/23/23 0803 06/24/23 0511 06/25/23 1610  Weight: 104.6 kg 106 kg 102.9 kg    Examination:  Physical Exam Constitutional:      General: She is not in acute distress. Cardiovascular:      Rate and Rhythm: Normal rate and regular rhythm.  Pulmonary:     Effort: Pulmonary effort is normal. No accessory muscle usage.     Breath sounds: Normal breath sounds.  Musculoskeletal:     Right lower leg: No edema.     Left lower leg: No edema.  Skin:    General: Skin is warm and dry.  Neurological:     General: No focal deficit present.     Mental Status: She is alert and oriented to person, place, and time.          Scheduled Medications:   acidophilus  1 capsule Oral BID   apixaban  5 mg Oral BID   Chlorhexidine Gluconate Cloth  6 each Topical Daily   colchicine  0.6 mg Oral Daily   empagliflozin  10 mg Oral Daily   [START ON 06/26/2023] furosemide  40 mg Oral Daily   metoprolol tartrate  50 mg Oral BID   predniSONE  20 mg Oral Q breakfast   sertraline  50 mg Oral Daily   sodium chloride flush  10-40 mL Intracatheter Q12H   sucralfate  1 g Oral TID WC & HS    Continuous Infusions:  cefTRIAXone (ROCEPHIN)  IV 2 g (06/25/23 1017)    PRN Medications:  acetaminophen, albuterol, dextromethorphan-guaiFENesin, diphenhydrAMINE, hydrALAZINE, methocarbamol, nitroGLYCERIN, ondansetron (ZOFRAN) IV, sodium chloride flush, traMADol  Antimicrobials from admission:  Anti-infectives (From admission, onward)    Start     Dose/Rate Route Frequency Ordered Stop   06/24/23 2200  vancomycin (VANCOREADY) IVPB 1500 mg/300 mL  Status:  Discontinued        1,500 mg 150 mL/hr over 120 Minutes Intravenous Every 48 hours 06/23/23 1156 06/23/23 1746   06/24/23 0600  cefTRIAXone (ROCEPHIN) 2 g in sodium chloride 0.9 % 100 mL IVPB  Status:  Discontinued        2 g 200 mL/hr over 30 Minutes Intravenous Every 24 hours 06/23/23 1748 06/23/23 1749   06/23/23 1800  cefTRIAXone (ROCEPHIN) 2 g in sodium chloride 0.9 % 100 mL IVPB        2 g 200 mL/hr over 30 Minutes Intravenous Every 24 hours 06/23/23 1749     06/23/23 1000  vancomycin (VANCOCIN) IVPB 1000 mg/200 mL premix        1,000  mg 200 mL/hr over 60 Minutes Intravenous  Once 06/23/23 0857 06/24/23 0736   06/21/23 2200  vancomycin (VANCOCIN) IVPB 1000 mg/200 mL premix  Status:  Discontinued        1,000 mg 200 mL/hr over 60 Minutes Intravenous Every 24 hours 06/20/23 1958  06/22/23 0704   06/21/23 1337  vancomycin variable dose per unstable renal function (pharmacist dosing)  Status:  Discontinued         Does not apply See admin instructions 06/21/23 1338 06/23/23 1156   06/21/23 0600  ceFEPIme (MAXIPIME) 2 g in sodium chloride 0.9 % 100 mL IVPB  Status:  Discontinued        2 g 200 mL/hr over 30 Minutes Intravenous Every 12 hours 06/20/23 1954 06/23/23 1746   06/21/23 0500  metroNIDAZOLE (FLAGYL) IVPB 500 mg  Status:  Discontinued        500 mg 100 mL/hr over 60 Minutes Intravenous Every 12 hours 06/21/23 0136 06/21/23 1329   06/21/23 0145  metroNIDAZOLE (FLAGYL) IVPB 500 mg  Status:  Discontinued        500 mg 100 mL/hr over 60 Minutes Intravenous Every 12 hours 06/21/23 0135 06/21/23 0136   06/20/23 2030  vancomycin (VANCOREADY) IVPB 1250 mg/250 mL        1,250 mg 166.7 mL/hr over 90 Minutes Intravenous  Once 06/20/23 1954 06/20/23 2357   06/20/23 1715  vancomycin (VANCOCIN) IVPB 1000 mg/200 mL premix        1,000 mg 200 mL/hr over 60 Minutes Intravenous  Once 06/20/23 1707 06/20/23 2000   06/20/23 1715  ceFEPIme (MAXIPIME) 2 g in sodium chloride 0.9 % 100 mL IVPB        2 g 200 mL/hr over 30 Minutes Intravenous  Once 06/20/23 1707 06/20/23 1846   06/20/23 1715  metroNIDAZOLE (FLAGYL) IVPB 500 mg        500 mg 100 mL/hr over 60 Minutes Intravenous  Once 06/20/23 1707 06/20/23 1930           Data Reviewed:  I have personally reviewed the following...  CBC: Recent Labs  Lab 06/21/23 0429 06/22/23 0524 06/23/23 0531 06/24/23 0516 06/25/23 0530  WBC 23.5* 19.0* 18.0* 17.6* 14.6*  NEUTROABS  --   --   --   --  12.7*  HGB 8.8* 8.5* 8.8* 8.8* 8.6*  HCT 29.2* 26.9* 28.3* 28.6* 27.6*  MCV 87.4  83.8 84.2 85.1 82.4  PLT 169 111* 77* 65* 93*   Basic Metabolic Panel: Recent Labs  Lab 06/20/23 1401 06/20/23 1808 06/21/23 0429 06/22/23 0440 06/23/23 0531 06/24/23 0516 06/25/23 0530  NA 135  --  134* 134* 135  --  133*  K 5.1  --  5.0 4.2 4.0  --  4.2  CL 105  --  103 100 103  --  100  CO2 17*  --  16* 23 23  --  23  GLUCOSE 117*  --  102* 116* 172*  --  214*  BUN 26*  --  31* 40* 37*  --  45*  CREATININE 1.53*  --  1.63* 1.89* 1.70* 1.62* 1.68*  CALCIUM 8.6*  --  8.5* 8.2* 8.2*  --  8.4*  MG  --  1.8  --   --   --   --   --    GFR: Estimated Creatinine Clearance: 36 mL/min (A) (by C-G formula based on SCr of 1.68 mg/dL (H)). Liver Function Tests: Recent Labs  Lab 06/20/23 1401 06/21/23 0429 06/23/23 0530 06/24/23 0516 06/25/23 0530  AST 102* 183* 651* 289* 166*  ALT 43 71* 272* 203* 171*  ALKPHOS 221* 192* 180* 179* 204*  BILITOT 2.1* 2.9* 1.8* 1.7* 1.7*  PROT 7.2 6.8 7.1 6.8 7.6  ALBUMIN 2.9* 2.8* 2.7* 2.6* 2.9*   Recent  Labs  Lab 06/20/23 1401  LIPASE 28   No results for input(s): "AMMONIA" in the last 168 hours. Coagulation Profile: Recent Labs  Lab 06/25/23 0530  INR 2.1*   Cardiac Enzymes: Recent Labs  Lab 06/24/23 0516  CKTOTAL 45   BNP (last 3 results) No results for input(s): "PROBNP" in the last 8760 hours. HbA1C: No results for input(s): "HGBA1C" in the last 72 hours. CBG: No results for input(s): "GLUCAP" in the last 168 hours. Lipid Profile: No results for input(s): "CHOL", "HDL", "LDLCALC", "TRIG", "CHOLHDL", "LDLDIRECT" in the last 72 hours. Thyroid Function Tests: No results for input(s): "TSH", "T4TOTAL", "FREET4", "T3FREE", "THYROIDAB" in the last 72 hours. Anemia Panel: No results for input(s): "VITAMINB12", "FOLATE", "FERRITIN", "TIBC", "IRON", "RETICCTPCT" in the last 72 hours. Most Recent Urinalysis On File:     Component Value Date/Time   COLORURINE STRAW (A) 06/20/2023 2114   APPEARANCEUR CLEAR (A) 06/20/2023 2114    LABSPEC 1.010 06/20/2023 2114   PHURINE 5.0 06/20/2023 2114   GLUCOSEU NEGATIVE 06/20/2023 2114   HGBUR NEGATIVE 06/20/2023 2114   BILIRUBINUR NEGATIVE 06/20/2023 2114   KETONESUR NEGATIVE 06/20/2023 2114   PROTEINUR NEGATIVE 06/20/2023 2114   NITRITE NEGATIVE 06/20/2023 2114   LEUKOCYTESUR NEGATIVE 06/20/2023 2114   Sepsis Labs: @LABRCNTIP (procalcitonin:4,lacticidven:4) Microbiology: Recent Results (from the past 240 hours)  Resp panel by RT-PCR (RSV, Flu A&B, Covid) Anterior Nasal Swab     Status: None   Collection Time: 06/20/23  6:08 PM   Specimen: Anterior Nasal Swab  Result Value Ref Range Status   SARS Coronavirus 2 by RT PCR NEGATIVE NEGATIVE Final    Comment: (NOTE) SARS-CoV-2 target nucleic acids are NOT DETECTED.  The SARS-CoV-2 RNA is generally detectable in upper respiratory specimens during the acute phase of infection. The lowest concentration of SARS-CoV-2 viral copies this assay can detect is 138 copies/mL. A negative result does not preclude SARS-Cov-2 infection and should not be used as the sole basis for treatment or other patient management decisions. A negative result may occur with  improper specimen collection/handling, submission of specimen other than nasopharyngeal swab, presence of viral mutation(s) within the areas targeted by this assay, and inadequate number of viral copies(<138 copies/mL). A negative result must be combined with clinical observations, patient history, and epidemiological information. The expected result is Negative.  Fact Sheet for Patients:  BloggerCourse.com  Fact Sheet for Healthcare Providers:  SeriousBroker.it  This test is no t yet approved or cleared by the [place] FDA and  has been authorized for detection and/or diagnosis of SARS-CoV-2 by FDA under an Emergency Use Authorization (EUA). This EUA will remain  in effect (meaning this test can be used) for the  duration of the COVID-19 declaration under Section 564(b)(1) of the Act, 21 U.S.C.section 360bbb-3(b)(1), unless the authorization is terminated  or revoked sooner.       Influenza A by PCR NEGATIVE NEGATIVE Final   Influenza B by PCR NEGATIVE NEGATIVE Final    Comment: (NOTE) The Xpert Xpress SARS-CoV-2/FLU/RSV plus assay is intended as an aid in the diagnosis of influenza from Nasopharyngeal swab specimens and should not be used as a sole basis for treatment. Nasal washings and aspirates are unacceptable for Xpert Xpress SARS-CoV-2/FLU/RSV testing.  Fact Sheet for Patients: BloggerCourse.com  Fact Sheet for Healthcare Providers: SeriousBroker.it  This test is not yet approved or cleared by the [place] FDA and has been authorized for detection and/or diagnosis of SARS-CoV-2 by FDA under an Emergency Use Authorization (EUA). This  EUA will remain in effect (meaning this test can be used) for the duration of the COVID-19 declaration under Section 564(b)(1) of the Act, 21 U.S.C. section 360bbb-3(b)(1), unless the authorization is terminated or revoked.     Resp Syncytial Virus by PCR NEGATIVE NEGATIVE Final    Comment: (NOTE) Fact Sheet for Patients: BloggerCourse.com  Fact Sheet for Healthcare Providers: SeriousBroker.it  This test is not yet approved or cleared by the [place] FDA and has been authorized for detection and/or diagnosis of SARS-CoV-2 by FDA under an Emergency Use Authorization (EUA). This EUA will remain in effect (meaning this test can be used) for the duration of the COVID-19 declaration under Section 564(b)(1) of the Act, 21 U.S.C. section 360bbb-3(b)(1), unless the authorization is terminated or revoked.  Performed at [facility], [address] [place], [place] [address]   Blood culture (routine x 2)     Status: None    Collection Time: 06/20/23  6:08 PM   Specimen: BLOOD  Result Value Ref Range Status   Specimen Description BLOOD BLOOD RIGHT ARM  Final   Special Requests   Final    BOTTLES DRAWN AEROBIC AND ANAEROBIC Blood Culture results may not be optimal due to an inadequate volume of blood received in culture bottles   Culture   Final    NO GROWTH 5 DAYS Performed at [facility], [address] [place], [place] [address]    Report Status 06/25/2023 FINAL  Final  Blood culture (routine x 2)     Status: None   Collection Time: 06/20/23  6:09 PM   Specimen: BLOOD  Result Value Ref Range Status   Specimen Description BLOOD BLOOD LEFT ARM  Final   Special Requests   Final    BOTTLES DRAWN AEROBIC AND ANAEROBIC Blood Culture results may not be optimal due to an inadequate volume of blood received in culture bottles   Culture   Final    NO GROWTH 5 DAYS Performed at [facility], [address], [place], [place] [address]    Report Status 06/25/2023 FINAL  Final  MRSA Next Gen by PCR, Nasal     Status: None   Collection Time: 06/21/23  4:29 AM   Specimen: Nasal Mucosa; Nasal Swab  Result Value Ref Range Status   MRSA by PCR Next Gen NOT DETECTED NOT DETECTED Final    Comment: (NOTE) The GeneXpert MRSA Assay (FDA approved for NASAL specimens only), is one component of a comprehensive MRSA colonization surveillance program. It is not intended to diagnose MRSA infection nor to guide or monitor treatment for MRSA infections. Test performance is not FDA approved in patients less than 46 years old. Performed at [facility], [address] [place], [place] [address]       Radiology Studies last 3 days: No results found.      Time spent: 50 min     Sunnie Nielsen, DO [facility] 06/25/2023, 2:33 PM    Dictation software may have been used to generate the above note. Typos may occur and escape review in typed/dictated notes. Please contact Dr  Lyn Hollingshead directly for clarity if needed.  Staff may message me via secure chat in Epic  but this may not receive an immediate response,  please page me for urgent matters!  If 7PM-7AM, please contact night coverage [email or web address]

## 2023-06-25 NOTE — Progress Notes (Addendum)
Date of Admission:  06/20/2023      ID: Tina Peters is a 73 y.o. female Principal Problem:   Acute on chronic diastolic CHF (congestive heart failure) (HCC) Active Problems:   Essential hypertension   OSA (obstructive sleep apnea)   History of CVA (cerebrovascular accident)   Hyperlipidemia   S/P MVR (mitral valve replacement)   Chronic a-fib (HCC)   Lumbar radiculopathy   Endocarditis of mitral valve   Acute kidney injury superimposed on CKD (HCC)   Pleural effusion   Elevated lactic acid level   Depression with anxiety   Abnormal LFTs   Myocardial injury   Leukocytosis   Shortness of breath   Pain of upper abdomen   Acute on chronic right-sided heart failure (HCC)   Hyponatremia   Obesity, Class III, BMI 40-49.9 (morbid obesity) (HCC)   Iron deficiency anemia   Acute gout   Chronic renal failure (CRF), stage 3b (HCC)   Thrombocytopenia (HCC) 73 y.o. 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 Had Blood culture positive for gemella,in 1 set of blood culture. TEE showed vegetation on the prosthetic mitral valve. Repeat Blood culture 06/13/23 neg . Cardiac surgery at [facility] was not considering any intervention, and asked to treat her with 6 weeks of IV and then repeat a TEE. WBC was as high as 44 at one time, and declined to 12 on discharge. She had PICC placement on 06/16/23 and was discharged home on 2/11  She Presented to the ED with sob and chest pain and abdominal pain on 06/20/23   Subjective: Feeling better Not sob No abdominal pain No back pain  Medications:   acidophilus  1  capsule Oral BID   apixaban  5 mg Oral BID   Chlorhexidine Gluconate Cloth  6 each Topical Daily   colchicine  0.6 mg Oral Daily   empagliflozin  10 mg Oral Daily   furosemide  40 mg Intravenous Daily   metoprolol tartrate  50 mg Oral BID   predniSONE  20 mg Oral Q breakfast   sertraline  50 mg Oral Daily   sodium chloride flush  10-40 mL Intracatheter Q12H   sucralfate  1 g Oral TID WC & HS    Objective: Vital signs in last 24 hours: Patient Vitals for the past 24 hrs:  BP Temp Temp src Pulse Resp SpO2 Weight  06/25/23 0837 (!) 133/122 (!) 97.4 F (36.3 C) Oral 81 -- 100 % --  06/25/23 0633 -- -- -- -- -- -- 102.9 kg  06/25/23 0418 131/81 (!) 97.4 F (36.3 C) -- 79 20 98 % --  06/25/23 0010 124/88 (!) 97.5 F (36.4 C) Axillary 78 18 97 % --  06/24/23 2025 120/83 (!) 97.5 F (36.4 C) Axillary 85 17 100 % --  06/24/23 1549 101/73 98.8 F (37.1 C) -- 95 19 93 % --  06/24/23 1213 122/82 98.6 F (37 C) -- 92 19 99 % --     LDA Rt PICC  PHYSICAL EXAM:  General: Alert, cooperative, no distress, appears stated age.  Head: Normocephalic, without obvious abnormality, atraumatic. Eyes: Conjunctivae clear, anicteric sclerae. Pupils are equal ENT  Nares normal. No drainage or sinus tenderness. Lips, mucosa, and tongue normal. No Thrush Neck: Supple, symmetrical, no adenopathy, thyroid: non tender no carotid bruit and no JVD. Back: No CVA tenderness. Lungs: b/l air entry Heart: irregular Abdomen: Soft, non-tender,not distended. Bowel sounds normal. No masses Extremities: rt foot- 3/4 toe less tender Skin: No rashes or lesions. Or bruising Lymph: Cervical, supraclavicular normal. Neurologic: Grossly non-focal  Lab Results    Latest Ref Rng & Units 06/25/2023    5:30 AM 06/24/2023    5:16 AM 06/23/2023    5:31 AM  CBC  WBC 4.0 - 10.5 K/uL 14.6  17.6  18.0   Hemoglobin 12.0 - 15.0 g/dL 8.6  8.8  8.8   Hematocrit 36.0 - 46.0 % 27.6  28.6  28.3   Platelets 150 - 400 K/uL 93   65  77        Latest Ref Rng & Units 06/25/2023    5:30 AM 06/24/2023    5:16 AM 06/23/2023    5:31 AM  CMP  Glucose 70 - 99 mg/dL 161   096   BUN 8 - 23 mg/dL 45   37   Creatinine 0.45 - 1.00 mg/dL 4.09  8.11  9.14   Sodium 135 - 145 mmol/L 133   135   Potassium 3.5 - 5.1 mmol/L 4.2   4.0   Chloride 98 - 111 mmol/L 100   103   CO2 22 - 32 mmol/L 23   23   Calcium 8.9 - 10.3 mg/dL 8.4   8.2   Total Protein 6.5 - 8.1 g/dL 7.6  6.8    Total Bilirubin 0.0 - 1.2 mg/dL 1.7  1.7    Alkaline Phos 38 - 126 U/L 204  179    AST 15 - 41 U/L 166  289    ALT 0 - 44 U/L 171  203        Microbiology: 06/20/23 BC NG    Assessment/Plan: Acute on chronic CHF - presenting with SOB /tightness  Rt heart failure Increase in Pulmonary artery pressure Being treated with diuretics   Abnormal LFTS could be from Hepatic congestion due to Rt heart failure, Possible obstructing gall stone which may have passed  LFTS  improving AST/ALT but alkaline phosphatase fluctuates  ? 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- On IV ceftriaxone for 6 weeks until 07/22/23 vanco and cefepime dc yesterday and now on ceftriaxone Plan to give it till 07/22/23   Gemella bacteremia- repeat blood cultures from 2/7 neg 2/14 Blood cultures neg so far MRI thoracic and lumbar spine neg last admission May need MRI with contrast if pain persist and leucocytosis persist     Atrial fibrillation Rate controlled   OSA   CKD stage 3   Leucocytosis- multifactorial steroids could be in the mix Thrombocytopenia- PPI DC Improving    Gout due to diuretics- on colchicine and prednisone     S/p MVR after endocarditis in 2023   H/o left lower extremity main vessels thrombectomy in 2023   Discussed the management with patient  ID will sign off- call if needed   OPAT orders from 06/16/23 reviewed no change  Diagnosis: Gemella bacteremia with  prosthetic mitral valve endocarditis Baseline Creatinine 1.68       Allergies  No Known Allergies     OPAT Orders Discharge antibiotics: Ceftriaxone 2 grams IV every 24 hours Duration: 6 weeks End Date:  07/22/23   PIC Care Per Protocol:including placement of biopatch   Labs weekly while on IV antibiotics: X__ CBC with differential   _X_ CMP _X_ CRP _X_ sed rate     X__ Please pull PIC at completion of IV antibiotics     Fax weekly lab results  promptly to [phone]   Clinic Follow Up Appt: 07/17/23 at 10.15 AM with Dr.Laquanta Hummel     Call [phone] with any questions or critical value

## 2023-06-25 NOTE — TOC Progression Note (Signed)
Transition of Care [facility]) - Progression Note    Patient Details  Name: Tina Peters MRN: [id number] Date of Birth: 16-Apr-1951  [facility]) CM/SW Contact  Margarito Liner, LCSW Phone Number: 06/25/2023, 1:29 PM  Clinical Narrative:   Readmission prevention screen complete. CSW met with patient. No supports at bedside. CSW introduced role and explained that discharge planning would be discussed. PCP is Shirlee Latch, MD. Patient typically drives herself to appointments. [facility] [address]. No issues obtaining medications. Patient lives home alone but will be staying with her daughter at discharge. Patient is active with [facility] for PT, OT, RN and has home infusion through [facility]. Patient has a RW, BSC, and cane. No further concerns. CSW will continue to follow patient for support and facilitate return home once stable. Her daughter will transport her home at discharge.  Expected Discharge Plan: Home w Home Health Services Barriers to Discharge: Continued Medical Work up  Expected Discharge Plan and Services     Post Acute Care Choice: Resumption of Svcs/PTA Provider Living arrangements for the past 2 months: Single Family Home                                       Social Determinants of Health (SDOH) Interventions SDOH Screenings   Food Insecurity: No Food Insecurity (06/21/2023)  Housing: Low Risk  (06/23/2023)  Transportation Needs: No Transportation Needs (06/23/2023)  Utilities: Not At Risk (06/21/2023)  Alcohol Screen: Low Risk  (01/27/2023)  Depression (PHQ2-9): Medium Risk (01/27/2023)  Financial Resource Strain: Medium Risk (06/23/2023)  Physical Activity: Insufficiently Active (01/27/2023)  Social Connections: Socially Isolated (06/21/2023)  Stress: Stress Concern Present (01/27/2023)  Tobacco Use: Medium Risk (06/23/2023)  Health Literacy: Adequate Health Literacy (01/27/2023)    Readmission Risk Interventions    06/25/2023    1:27 PM   Readmission Risk Prevention Plan  Transportation Screening Complete  Medication Review (RN Care Manager) Complete  PCP or Specialist appointment within 3-5 days of discharge Complete  HRI or Home Care Consult Complete  SW Recovery Care/Counseling Consult Complete  Palliative Care Screening Not Applicable  Skilled Nursing Facility Not Applicable

## 2023-06-25 NOTE — Progress Notes (Signed)
Rounding Note    Patient Name: Tina Peters Date of Encounter: 06/25/2023  [facility] Cardiologist: Lorine Bears, MD   Subjective   No chest pain. Dyspnea improving. Documented UOP 522 mL for the past 24 hours, net - 2.5 L for the admission. Renal function starting to trend up. Weight trend 106 to 102.9 kg over the past 24 hours. Tolerating SGLT2i. Hgb stable, PLT improved.    Inpatient Medications    Scheduled Meds:  acidophilus  1 capsule Oral BID   apixaban  5 mg Oral BID   Chlorhexidine Gluconate Cloth  6 each Topical Daily   colchicine  0.6 mg Oral Daily   empagliflozin  10 mg Oral Daily   furosemide  40 mg Intravenous Daily   metoprolol tartrate  50 mg Oral BID   predniSONE  20 mg Oral Q breakfast   sertraline  50 mg Oral Daily   sodium chloride flush  10-40 mL Intracatheter Q12H   sucralfate  1 g Oral TID WC & HS   Continuous Infusions:  cefTRIAXone (ROCEPHIN)  IV 2 g (06/24/23 1008)   PRN Meds: acetaminophen, albuterol, dextromethorphan-guaiFENesin, diphenhydrAMINE, hydrALAZINE, methocarbamol, nitroGLYCERIN, ondansetron (ZOFRAN) IV, sodium chloride flush, traMADol   Vital Signs    Vitals:   06/25/23 0010 06/25/23 0418 06/25/23 0633 06/25/23 0837  BP: 124/88 131/81  (!) 133/122  Pulse: 78 79  81  Resp: 18 20    Temp: (!) 97.5 F (36.4 C) (!) 97.4 F (36.3 C)  (!) 97.4 F (36.3 C)  TempSrc: Axillary   Oral  SpO2: 97% 98%  100%  Weight:   102.9 kg   Height:        Intake/Output Summary (Last 24 hours) at 06/25/2023 0911 Last data filed at 06/25/2023 1610 Gross per 24 hour  Intake --  Output 1002 ml  Net -1002 ml      06/25/2023    6:33 AM 06/24/2023    5:11 AM 06/23/2023    8:03 AM  Last 3 Weights  Weight (lbs) 226 lb 13.7 oz 233 lb 11 oz 230 lb 11.2 oz  Weight (kg) 102.9 kg 106 kg 104.645 kg      Telemetry    Atrial fibrillation rates 80s to 90s bpm - Personally Reviewed  ECG    No new tracings - Personally  Reviewed  Physical Exam   GEN: No acute distress Neck: unable to determine JVD due to body habitus Cardiac: IRIR, no murmurs, rubs, or gallops  Respiratory: Improving breath sounds bilaterally with mildly diminished breath sounds along the bases bilaterally. Respirations are unlabored at rest on room air GI: Soft, nontender, obese, non-distended  MS: Trivial pretibial edema Neuro:  Nonfocal  Psych: Normal affect   Labs    High Sensitivity Troponin:   Recent Labs  Lab 06/10/23 0053 06/10/23 0731 06/10/23 1531 06/20/23 1400 06/20/23 1808  TROPONINIHS 331* 362* 228* 105* 107*     Chemistry Recent Labs  Lab 06/20/23 1808 06/21/23 0429 06/22/23 0440 06/23/23 0530 06/23/23 0531 06/24/23 0516 06/25/23 0530  NA  --    < > 134*  --  135  --  133*  K  --    < > 4.2  --  4.0  --  4.2  CL  --    < > 100  --  103  --  100  CO2  --    < > 23  --  23  --  23  GLUCOSE  --    < >  116*  --  172*  --  214*  BUN  --    < > 40*  --  37*  --  45*  CREATININE  --    < > 1.89*  --  1.70* 1.62* 1.68*  CALCIUM  --    < > 8.2*  --  8.2*  --  8.4*  MG 1.8  --   --   --   --   --   --   PROT  --    < >  --  7.1  --  6.8 7.6  ALBUMIN  --    < >  --  2.7*  --  2.6* 2.9*  AST  --    < >  --  651*  --  289* 166*  ALT  --    < >  --  272*  --  203* 171*  ALKPHOS  --    < >  --  180*  --  179* 204*  BILITOT  --    < >  --  1.8*  --  1.7* 1.7*  GFRNONAA  --    < > 28*  --  32* 34* 32*  ANIONGAP  --    < > 11  --  9  --  10   < > = values in this interval not displayed.    Lipids No results for input(s): "CHOL", "TRIG", "HDL", "LABVLDL", "LDLCALC", "CHOLHDL" in the last 168 hours.  Hematology Recent Labs  Lab 06/23/23 0531 06/24/23 0516 06/25/23 0530  WBC 18.0* 17.6* 14.6*  RBC 3.36* 3.36* 3.35*  HGB 8.8* 8.8* 8.6*  HCT 28.3* 28.6* 27.6*  MCV 84.2 85.1 82.4  MCH 26.2 26.2 25.7*  MCHC 31.1 30.8 31.2  RDW 18.2* 18.3* 18.4*  PLT 77* 65* 93*   Thyroid No results for input(s): "TSH",  "FREET4" in the last 168 hours.  BNP Recent Labs  Lab 06/20/23 1400  BNP 1,353.9*    DDimer No results for input(s): "DDIMER" in the last 168 hours.   Radiology    US Abdomen Limited RUQ (LIVER/GB) Result Date: 06/21/2023 IMPRESSION: 1. Cholelithiasis without findings of acute cholecystitis. 2. Right pleural effusion. Electronically Signed   By: Gaylyn Rong M.D.   On: 06/21/2023 10:06    Cardiac Studies  Limited 2 D echo 06/21/2023  1. Left ventricular ejection fraction, by estimation, is 55 to 60%. The  left ventricle has normal function. The left ventricle has no regional  wall motion abnormalities.   2. Right ventricular systolic function is moderately reduced. The right  ventricular size is moderately enlarged. Tricuspid regurgitation signal is  inadequate for assessing PA pressure.   3. Mitral valve mean gradient not measured.. The mitral valve has been  repaired/replaced. Mild mitral valve regurgitation. Small mobile opacity  again noted on leaflets concerning for valve vegetation.   4. The aortic valve is normal in structure. There is mild calcification  of the aortic valve. Aortic valve regurgitation is not visualized. Aortic  valve sclerosis is present, with no evidence of aortic valve stenosis.   Patient Profile     73 y.o. female with a past medical history of paroxysmal atrial fibrillation/flutter, tachybradycardia syndrome, PSVT, hypertension, hyperlipidemia, stroke, obstructive sleep apnea on CPAP, CKD 3, endocarditis status post MVR, asthma, tobacco abuse, chronic back pain, and diverticulosis, who is being seen and evaluated for heart failure exacerbation.  Assessment & Plan    Acute on chronic HFpEF exacerbation -Presented with worsening shortness  of breath and swelling -BNP 1353 -Vascular congestion on chest x-ray with pleural effusions -Echocardiogram reveals an LVEF of 55-60% -Volume status improved -Ins and outs incomplete  -Transition from IV  Lasix 40 mg daily to oral Lasix 40 mg daily on 2/20 -Now on Farxiga 10 mg daily -Not on MRA with acute on CKD -Heart failure education -Fluid restriction -Daily weights, I's and O's, low-sodium diet  Bioprosthetic mitral valve endocarditis -Previously hospitalized with TEE completed with vegetation found on the valve, discharged with PICC line and continued antibiotic therapy with repeat TTE recommended in 6 weeks -CT surgery Dr. Leafy Ro recommended conservative management -Outpatient follow-up with TTE to be arranged -Limited study completed revealed an LVEF 55 to 60%, no RWMA, with a small mobile opacity again noted on the leaflets concerning for valve vegetation with mild mitral regurgitation -Antibiotics continued per IM -Blood cultures no growth x 5 days -Followed by ID  Paroxysmal atrial fibrillation/flutter -Review of telemetry reveals she is back in atrial flutter that is rate controlled -Previously evaluated by EP with patient declining rhythm control at that time, in this setting amiodarone was stopped on 2/16 -With heart failure, may need to reconsider rhythm control strategy, outpatient follow up with EP -Remains on apixaban 5 mg twice daily for CHA2DS2-VASc of at least 7 for stroke prophylaxis as well as metoprolol tartrate 50 mg twice daily -Continued on telemetry monitoring  Acute on CKD stage III -BUN/serum creatinine starting to increase this morning -Baseline creatinine 1.0 -Serum creatinine on admission was 1.6 -Monitor urine output -Monitor/trend/replete electrolytes as needed -Daily BMP -Avoid nephrotoxic agents were able  Hypertension -Blood pressure stable -Pharmacotherapy as above  Hyperlipidemia -Continued on atorvastatin  Demand ischemia -High-sensitivity troponin trended 105 to 107, flat not consistent with ACS -Chest CT negative for PE -Echocardiogram revealed an LVEF of 55-60%, no RWMA -Continued on aspirin 81 mg daily and atorvastatin -EKG as  needed for pain or changes -No plan for ischemic workup  8. Normocytic anemia/thrombocytopenia: -Stable -Monitor -Would need to consider holding DOAC if Hgb/PLT continues to drop  -Contributing to dyspnea      For questions or updates, please contact [facility] Please consult [email or web address] for contact info under        Signed, Eula Listen, PA-C  06/25/2023, 9:11 AM

## 2023-06-25 NOTE — Telephone Encounter (Signed)
HHA cetification form faxed back to [facility] today VM

## 2023-06-25 NOTE — Plan of Care (Signed)

## 2023-06-26 DIAGNOSIS — I272 Pulmonary hypertension, unspecified: Secondary | ICD-10-CM | POA: Diagnosis not present

## 2023-06-26 DIAGNOSIS — J90 Pleural effusion, not elsewhere classified: Secondary | ICD-10-CM | POA: Diagnosis not present

## 2023-06-26 DIAGNOSIS — I483 Typical atrial flutter: Secondary | ICD-10-CM | POA: Diagnosis not present

## 2023-06-26 DIAGNOSIS — D72829 Elevated white blood cell count, unspecified: Secondary | ICD-10-CM | POA: Diagnosis not present

## 2023-06-26 DIAGNOSIS — K5790 Diverticulosis of intestine, part unspecified, without perforation or abscess without bleeding: Secondary | ICD-10-CM

## 2023-06-26 DIAGNOSIS — I482 Chronic atrial fibrillation, unspecified: Secondary | ICD-10-CM | POA: Diagnosis not present

## 2023-06-26 DIAGNOSIS — I5033 Acute on chronic diastolic (congestive) heart failure: Secondary | ICD-10-CM | POA: Diagnosis not present

## 2023-06-26 LAB — CBC
HCT: 29 % — ABNORMAL LOW (ref 36.0–46.0)
Hemoglobin: 8.7 g/dL — ABNORMAL LOW (ref 12.0–15.0)
MCH: 25.1 pg — ABNORMAL LOW (ref 26.0–34.0)
MCHC: 30 g/dL (ref 30.0–36.0)
MCV: 83.8 fL (ref 80.0–100.0)
Platelets: 75 10*3/uL — ABNORMAL LOW (ref 150–400)
RBC: 3.46 MIL/uL — ABNORMAL LOW (ref 3.87–5.11)
RDW: 18.4 % — ABNORMAL HIGH (ref 11.5–15.5)
WBC: 12 10*3/uL — ABNORMAL HIGH (ref 4.0–10.5)
nRBC: 1.5 % — ABNORMAL HIGH (ref 0.0–0.2)

## 2023-06-26 LAB — BASIC METABOLIC PANEL
Anion gap: 7 (ref 5–15)
BUN: 43 mg/dL — ABNORMAL HIGH (ref 8–23)
CO2: 30 mmol/L (ref 22–32)
Calcium: 8.3 mg/dL — ABNORMAL LOW (ref 8.9–10.3)
Chloride: 101 mmol/L (ref 98–111)
Creatinine, Ser: 1.53 mg/dL — ABNORMAL HIGH (ref 0.44–1.00)
GFR, Estimated: 36 mL/min — ABNORMAL LOW (ref >60)
Glucose, Bld: 123 mg/dL — ABNORMAL HIGH (ref 70–99)
Potassium: 4.1 mmol/L (ref 3.5–5.1)
Sodium: 138 mmol/L (ref 135–145)

## 2023-06-26 MED ORDER — CEFTRIAXONE IV (FOR PTA / DISCHARGE USE ONLY): 2.0000 g | INTRAVENOUS | 0 refills | Status: AC

## 2023-06-26 NOTE — Plan of Care (Signed)

## 2023-06-26 NOTE — Progress Notes (Signed)
Rounding Note    Patient Name: Tina Peters Date of Encounter: 06/26/2023  [facility] Cardiologist: Lorine Bears, MD   Subjective   Reports feeling well, denies significant shortness of breath Able to weight bear more on her right ankle using walker Remains on ceftriaxone Denies chills fever No family at the bedside   Inpatient Medications    Scheduled Meds:  acidophilus  1 capsule Oral BID   apixaban  5 mg Oral BID   Chlorhexidine Gluconate Cloth  6 each Topical Daily   colchicine  0.6 mg Oral Daily   empagliflozin  10 mg Oral Daily   furosemide  40 mg Oral Daily   metoprolol tartrate  50 mg Oral BID   predniSONE  20 mg Oral Q breakfast   sertraline  50 mg Oral Daily   sodium chloride flush  10-40 mL Intracatheter Q12H   sucralfate  1 g Oral TID WC & HS   Continuous Infusions:  cefTRIAXone (ROCEPHIN)  IV 2 g (06/26/23 0906)   PRN Meds: acetaminophen, albuterol, dextromethorphan-guaiFENesin, diphenhydrAMINE, hydrALAZINE, methocarbamol, nitroGLYCERIN, ondansetron (ZOFRAN) IV, sodium chloride flush, traMADol   Vital Signs    Vitals:   06/26/23 0349 06/26/23 0500 06/26/23 0736 06/26/23 1210  BP: 107/77  112/80 113/86  Pulse: 88  92 83  Resp: 20  19 19   Temp: 98.6 F (37 C)  97.9 F (36.6 C) 98 F (36.7 C)  TempSrc: Oral  Oral Oral  SpO2: 94%  95% 94%  Weight:  101.7 kg    Height:        Intake/Output Summary (Last 24 hours) at 06/26/2023 1456 Last data filed at 06/26/2023 1435 Gross per 24 hour  Intake 200 ml  Output 1400 ml  Net -1200 ml      06/26/2023    5:00 AM 06/25/2023    6:33 AM 06/24/2023    5:11 AM  Last 3 Weights  Weight (lbs) 224 lb 3.3 oz 226 lb 13.7 oz 233 lb 11 oz  Weight (kg) 101.7 kg 102.9 kg 106 kg      Telemetry    Atrial flutter rate 80- Personally Reviewed  ECG     - Personally Reviewed  Physical Exam   GEN: No acute distress.   Neck: No JVD Cardiac: Irregularly irregular, no murmurs, rubs, or  gallops.  Respiratory: Clear to auscultation bilaterally. GI: Soft, nontender, non-distended  MS: No edema; No deformity. Neuro:  Nonfocal  Psych: Normal affect   Labs    High Sensitivity Troponin:   Recent Labs  Lab 06/10/23 0053 06/10/23 0731 06/10/23 1531 06/20/23 1400 06/20/23 1808  TROPONINIHS 331* 362* 228* 105* 107*     Chemistry Recent Labs  Lab 06/20/23 1808 06/21/23 0429 06/23/23 0530 06/23/23 0531 06/24/23 0516 06/25/23 0530 06/26/23 0613  NA  --    < >  --  135  --  133* 138  K  --    < >  --  4.0  --  4.2 4.1  CL  --    < >  --  103  --  100 101  CO2  --    < >  --  23  --  23 30  GLUCOSE  --    < >  --  172*  --  214* 123*  BUN  --    < >  --  37*  --  45* 43*  CREATININE  --    < >  --  1.70*  1.62* 1.68* 1.53*  CALCIUM  --    < >  --  8.2*  --  8.4* 8.3*  MG 1.8  --   --   --   --   --   --   PROT  --    < > 7.1  --  6.8 7.6  --   ALBUMIN  --    < > 2.7*  --  2.6* 2.9*  --   AST  --    < > 651*  --  289* 166*  --   ALT  --    < > 272*  --  203* 171*  --   ALKPHOS  --    < > 180*  --  179* 204*  --   BILITOT  --    < > 1.8*  --  1.7* 1.7*  --   GFRNONAA  --    < >  --  32* 34* 32* 36*  ANIONGAP  --    < >  --  9  --  10 7   < > = values in this interval not displayed.    Lipids No results for input(s): "CHOL", "TRIG", "HDL", "LABVLDL", "LDLCALC", "CHOLHDL" in the last 168 hours.  Hematology Recent Labs  Lab 06/24/23 0516 06/25/23 0530 06/26/23 0613  WBC 17.6* 14.6* 12.0*  RBC 3.36* 3.35* 3.46*  HGB 8.8* 8.6* 8.7*  HCT 28.6* 27.6* 29.0*  MCV 85.1 82.4 83.8  MCH 26.2 25.7* 25.1*  MCHC 30.8 31.2 30.0  RDW 18.3* 18.4* 18.4*  PLT 65* 93* 75*   Thyroid No results for input(s): "TSH", "FREET4" in the last 168 hours.  BNP Recent Labs  Lab 06/20/23 1400  BNP 1,353.9*    DDimer No results for input(s): "DDIMER" in the last 168 hours.   Radiology    No results found.  Cardiac Studies   Echo 1. Left ventricular ejection fraction, by  estimation, is 55 to 60%. The  left ventricle has normal function. The left ventricle has no regional  wall motion abnormalities.   2. Right ventricular systolic function is moderately reduced. The right  ventricular size is moderately enlarged. Tricuspid regurgitation signal is  inadequate for assessing PA pressure.   3. Mitral valve mean gradient not measured.. The mitral valve has been  repaired/replaced. Mild mitral valve regurgitation. Small mobile opacity  again noted on leaflets concerning for valve vegetation.   4. The aortic valve is normal in structure. There is mild calcification  of the aortic valve. Aortic valve regurgitation is not visualized. Aortic  valve sclerosis is present, with no evidence of aortic valve stenosis.   Patient Profile     73 y.o. female with a past medical history of paroxysmal atrial fibrillation/flutter, tachybradycardia syndrome, PSVT, hypertension, hyperlipidemia, stroke, obstructive sleep apnea on CPAP, CKD 3, endocarditis status post MVR, asthma, tobacco abuse, chronic back pain, and diverticulosis, who is being seen and evaluated for heart failure exacerbation.   Assessment & Plan    Pulmonary hypertension, RV failure Dilated pulmonary artery noted on CT scan,  Reduced RV function with dilation of RV noted on echo June 10, 2023 and on echo yesterday -Possibly exacerbated by underlying atrial flutter and anemia -Good response with IV Lasix twice daily this admission, has been transition to Lasix 40 daily -Will recommend we discharged on Lasix 40 daily, stable renal function   Atrial flutter, typical Seen by EP May 16, 2023, felt to have converted into flutter likely at some point  in 2024, previously indicated on her visit to EP she did not want cardioversion Flutter rate controlled -Amiodarone 200 mg pill held this admission given desire for rate control -Continue Eliquis 5 twice daily -Good rate control on metoprolol tartrate 50 twice  daily, rate 80   Abdominal pain Gallstones without cholecystitis Prior CT abdomen showing retrosigmoid diverticulosis -Seems to have improved    Pleural effusion Small to moderate right and small left pleural effusions on arrival In the setting of pulmonary hypertension, diastolic CHF -Has responded to IV Lasix twice daily, now transition to Lasix 40 daily   Demand ischemia Nontrending troponin 105, 107 Supply/demand mismatch in the setting of atrial flutter, CHF Echocardiogram confirming normal ejection fraction No plans for ischemic workup   Endocarditis prosthetic mitral valve Thickened mitral valve seen on echocardiogram, with small mobile vegetation, grossly no significant change compared to TEE February 5, -Not a good candidate for valve replacement -Blood cultures negative -Continue ceftriaxone per ID until March 18   Chronic kidney disease stage III Baseline creatinine 1.0, Creatinine this admission 1.5, stable Initially treated with Lasix IV, now tolerating oral Lasix 40 daily, BMP stable   Anemia Hemoglobin 8.7 In the setting of renal dysfunction, recent bacteremia/sepsis Continue Eliquis with close monitoring -Consider checking iron level     For questions or updates, please contact [facility] Please consult [email or web address] for contact info under        Signed, Julien Nordmann, MD  06/26/2023, 2:56 PM

## 2023-06-26 NOTE — Progress Notes (Signed)
PROGRESS NOTE    Tina Peters   WUJ:[id number] DOB: 05-06-1951  DOA: 06/20/2023 Date of Service: 06/26/23 which is hospital day 6  PCP: Erasmo Downer, MD    Hospital course / significant events:   HPI: 73 y.o. female with medical history significant of A-fib on Eliquis, hypertension, CKD stage IIIa, prior history of stroke, dCHF, hypertension, HLD, depression with anxiety, s/p bioprosthetic AVR in 10/2021, recent admission due to endocarditis on Rocephin via PICC line, lumbar radiculopathy, who presents with shortness breath, chest pain, abdominal pain.  Patient was recently hospitalized from 2/3 - 2/11 due to mitral valve endocarditis and Gemella bacteremia. TEE 02/05 showing mobile opacity on posterior leaflet mitral valve prosthetic valve. Cardiology following, have discussed w/ CT surgery and recs for NO surgery/replacement at this time, favor plan for repeat TEE in 6 weeks to reevaluate mitral valve. Pt is discharged on 6 weeks of IV Rocephin.  PICC line is placed on 2/10.   Pt states that she started having shortness of breath since this morning, which has been progressively worsening.  Patient has cough with clear mucus production.  Also reports left-sided chest pain, no fever or chills.  She has diffused abdominal pain, which is constant, aching, moderate, nonradiating.  She has nausea, dry heaves.  She states that she has some diarrhea recently, which has resolved.  Today no diarrhea.  Denies symptoms of UTI.  She complains of lower back pain.  Of note, patient had MRI spine L and T spine which had no evidence osteo/discitis.   02/14: pt was found to have BNP 1353, WBC 20.7 (12.5 on 06/17/2023), negative PCR for COVID, flu and RSV, lactic acid 4.8 --> 3.8, temperature normal, blood pressure 118/66, heart rate 90, RR 28, worsening renal function, liver function (ALP 221, AST 102, ALT 43, total bilirubin 2.1.  Patient is admitted to telemetry bed as inpatient. 02/15.  Patient  still complains of pain between the shoulder blades.  Added a muscle relaxant and changed her pain medications over to tramadol.  1 dose of morphine given for severe pain. Echo today - EF 55-60%, normal LV fxn, mod reduced RV fxn, no new valve abn, persistent mobile opacity on mitral valve c/w known vegetation  02/16.  Patient complaining severe pain in her right foot and right first toe.  Likely gout secondary to diuresis.  Platelet count 111. 02/17.  Right foot pain much better.  Continue Solu-Medrol.  Cardiology decreased Lasix to daily dosing.  Platelet count 77 02/18.  Right foot pain continues to improve.  Continue Solu-Medrol today and switch over to prednisone for tomorrow.  Continue daily IV Lasix for heart failure.  Continue to watch platelets.  Platelet count 65 02/19: on po lasix and improving.  02/20: per cardiology, ok to dc on po lasix. Confirmed IV abx. Daughter can get her tomorrow - will repeat labs in AM and if still trending appropriately anticipate discharge      Consultants:  Cardiology Infectious Disease   Procedures/Surgeries: none      ASSESSMENT & PLAN:   Acute on chronic diastolic CHF (congestive heart failure) (HCC) Consistent with right heart failure and pulmonary hypertension.  Echocardiogram showed decreased right ventricular function and normal left ventricular ejection fraction.   Continue po Lasix daily 40 mg.   Jardiance.   Cardiology following.   Acute gout Right first toe and right foot secondary to diuresis and kidney impairment.  Pain much better today.   Solu-Medrol changed to prednisone today Continue  colchicine.   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  CT scan did not show pneumonia.  Blood cultures negative for 4 days.   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   ID changed antibiotics back to Rocephin on 2/17.   White blood cell count slowly trending better at 17.6 but patient now on steroids. On IV ceftriaxone for 6 weeks until 07/22/23 MRI thoracic and lumbar spine neg last admission - May need MRI with contrast if pain persist and leucocytosis persist  Pleural effusion Likely with right-sided heart failure Repeat imaging if deteriorating respiratory status    Elevated lactic acid level Trended in the right direction. Monitor as needed   Acute kidney injury superimposed on CKD3b Patient has baseline creatinine around 1.02.  Had acute kidney injury on last hospitalization.  Creatinine peaked at 1.89.  Creatinine down to 1.62. Trend BMP   Chronic a-fib (HCC) On Eliquis for anticoagulation and metoprolol for rate control. if platelets drop below 50,000, would favor discontinuation of apixaban    Hyperlipidemia On atorvastatin - held d/t abnormal LFT   Essential hypertension Continue Lasix and metoprolol   History of CVA (cerebrovascular accident) On aspirin  Held atorvastatin d/t abnormal LFT    Myocardial injury Demand ischemia from CHF Repeat troponin/EKG prn chest pain    Abnormal LFTs Could be secondary to infection.   Right upper quadrant shows gallstones but no evidence of cholecystitis. Biliary sludge due to ceftriaxone causing this picture is remotely possible but very unlikely   Hold Lipitor. Trend CMP Consider MRCP?   OSA (obstructive sleep apnea) CPAP at night   Depression with anxiety Continue Zoloft   Thrombocytopenia  Platelet count dropped down to 65.  Could be secondary to infection and/or antibiotics.   Protonix discontinued and switched over to Carafate.   Continue to monitor. if platelets drop below 50,000, would favor discontinuation of apixaban    Iron deficiency anemia Hemoglobin 8.8 and ferritin 67. Monitor CBC   Obesity, Class III, BMI 40-49.9  (morbid obesity) overestimated d/t fluid weight  BMI now down to 38.89 with diuresis   Hyponatremia Last sodium normal range Monitor BMP         Class 2 obesity based on BMI: Body mass index is 37.75 kg/m.  Underweight - under 18  overweight - 25 to 29 obese - 30 or more Class 1 obesity: BMI of 30.0 to 34 Class 2 obesity: BMI of 35.0 to 39 Class 3 obesity: BMI of 40.0 to 49 Super Morbid Obesity: BMI 50-59 Super-super Morbid Obesity: BMI 60+ Significantly low or high BMI is associated with higher medical risk.  Weight management advised as adjunct to other disease management and risk reduction treatments    DVT prophylaxis: Eliquis but if platelets drop below 50,000, would favor discontinuation IV fluids: no continuous IV fluids  Nutrition: cardiac diet  Central lines / invasive devices: PICC line   Code Status: FULL CODE ACP documentation reviewed:  none on file in [facility]  TOC needs: home health Barriers to dispo / significant pending items: cardiology clearance prior to d/c - possible can go home tomorrow if continues to do well on po diuretics              Subjective / Brief ROS:  Patient reports feeling okay today, still significant fatigue and some SOB / fairly  minimal activity. NO chest pain  Pain improving but she is not wanting to walk w/ PT Denies new weakness.  Tolerating diet.  Reports no concerns w/ urination/defecation.   Family Communication: spoke to daughter Bonita Quin today approx 3:30 PM over the phone     Objective Findings:  Vitals:   06/26/23 0349 06/26/23 0500 06/26/23 0736 06/26/23 1210  BP: 107/77  112/80 113/86  Pulse: 88  92 83  Resp: 20  19 19   Temp: 98.6 F (37 C)  97.9 F (36.6 C) 98 F (36.7 C)  TempSrc: Oral  Oral Oral  SpO2: 94%  95% 94%  Weight:  101.7 kg    Height:        Intake/Output Summary (Last 24 hours) at 06/26/2023 1552 Last data filed at 06/26/2023 1435 Gross per 24 hour  Intake 200 ml  Output 1400 ml   Net -1200 ml   Filed Weights   06/24/23 0511 06/25/23 0633 06/26/23 0500  Weight: 106 kg 102.9 kg 101.7 kg    Examination:  Physical Exam Constitutional:      General: She is not in acute distress. Cardiovascular:     Rate and Rhythm: Normal rate and regular rhythm.  Pulmonary:     Effort: Pulmonary effort is normal. No accessory muscle usage.     Breath sounds: Normal breath sounds.  Musculoskeletal:     Right lower leg: No edema.     Left lower leg: No edema.  Skin:    General: Skin is warm and dry.  Neurological:     General: No focal deficit present.     Mental Status: She is alert and oriented to person, place, and time.          Scheduled Medications:   acidophilus  1 capsule Oral BID   apixaban  5 mg Oral BID   Chlorhexidine Gluconate Cloth  6 each Topical Daily   colchicine  0.6 mg Oral Daily   empagliflozin  10 mg Oral Daily   furosemide  40 mg Oral Daily   metoprolol tartrate  50 mg Oral BID   predniSONE  20 mg Oral Q breakfast   sertraline  50 mg Oral Daily   sodium chloride flush  10-40 mL Intracatheter Q12H   sucralfate  1 g Oral TID WC & HS    Continuous Infusions:  cefTRIAXone (ROCEPHIN)  IV 2 g (06/26/23 0906)    PRN Medications:  acetaminophen, albuterol, dextromethorphan-guaiFENesin, diphenhydrAMINE, hydrALAZINE, methocarbamol, nitroGLYCERIN, ondansetron (ZOFRAN) IV, sodium chloride flush, traMADol  Antimicrobials from admission:  Anti-infectives (From admission, onward)    Start     Dose/Rate Route Frequency Ordered Stop   06/24/23 2200  vancomycin (VANCOREADY) IVPB 1500 mg/300 mL  Status:  Discontinued        1,500 mg 150 mL/hr over 120 Minutes Intravenous Every 48 hours 06/23/23 1156 06/23/23 1746   06/24/23 0600  cefTRIAXone (ROCEPHIN) 2 g in sodium chloride 0.9 % 100 mL IVPB  Status:  Discontinued        2 g 200 mL/hr over 30 Minutes Intravenous Every 24 hours 06/23/23 1748 06/23/23 1749   06/23/23 1800  cefTRIAXone (ROCEPHIN) 2  g in sodium chloride 0.9 % 100 mL IVPB        2 g 200 mL/hr over 30 Minutes Intravenous Every 24 hours 06/23/23 1749     06/23/23 1000  vancomycin (VANCOCIN) IVPB 1000 mg/200 mL premix        1,000 mg 200 mL/hr over 60 Minutes Intravenous  Once 06/23/23 0857 06/24/23 0736   06/21/23 2200  vancomycin (VANCOCIN) IVPB 1000 mg/200 mL premix  Status:  Discontinued        1,000 mg 200 mL/hr over 60 Minutes Intravenous Every 24 hours 06/20/23 1958 06/22/23 0704   06/21/23 1337  vancomycin variable dose per unstable renal function (pharmacist dosing)  Status:  Discontinued         Does not apply See admin instructions 06/21/23 1338 06/23/23 1156   06/21/23 0600  ceFEPIme (MAXIPIME) 2 g in sodium chloride 0.9 % 100 mL IVPB  Status:  Discontinued        2 g 200 mL/hr over 30 Minutes Intravenous Every 12 hours 06/20/23 1954 06/23/23 1746   06/21/23 0500  metroNIDAZOLE (FLAGYL) IVPB 500 mg  Status:  Discontinued        500 mg 100 mL/hr over 60 Minutes Intravenous Every 12 hours 06/21/23 0136 06/21/23 1329   06/21/23 0145  metroNIDAZOLE (FLAGYL) IVPB 500 mg  Status:  Discontinued        500 mg 100 mL/hr over 60 Minutes Intravenous Every 12 hours 06/21/23 0135 06/21/23 0136   06/20/23 2030  vancomycin (VANCOREADY) IVPB 1250 mg/250 mL        1,250 mg 166.7 mL/hr over 90 Minutes Intravenous  Once 06/20/23 1954 06/20/23 2357   06/20/23 1715  vancomycin (VANCOCIN) IVPB 1000 mg/200 mL premix        1,000 mg 200 mL/hr over 60 Minutes Intravenous  Once 06/20/23 1707 06/20/23 2000   06/20/23 1715  ceFEPIme (MAXIPIME) 2 g in sodium chloride 0.9 % 100 mL IVPB        2 g 200 mL/hr over 30 Minutes Intravenous  Once 06/20/23 1707 06/20/23 1846   06/20/23 1715  metroNIDAZOLE (FLAGYL) IVPB 500 mg        500 mg 100 mL/hr over 60 Minutes Intravenous  Once 06/20/23 1707 06/20/23 1930           Data Reviewed:  I have personally reviewed the following...  CBC: Recent Labs  Lab 06/22/23 0524  06/23/23 0531 06/24/23 0516 06/25/23 0530 06/26/23 0613  WBC 19.0* 18.0* 17.6* 14.6* 12.0*  NEUTROABS  --   --   --  12.7*  --   HGB 8.5* 8.8* 8.8* 8.6* 8.7*  HCT 26.9* 28.3* 28.6* 27.6* 29.0*  MCV 83.8 84.2 85.1 82.4 83.8  PLT 111* 77* 65* 93* 75*   Basic Metabolic Panel: Recent Labs  Lab 06/20/23 1808 06/21/23 0429 06/22/23 0440 06/23/23 0531 06/24/23 0516 06/25/23 0530 06/26/23 0613  NA  --  134* 134* 135  --  133* 138  K  --  5.0 4.2 4.0  --  4.2 4.1  CL  --  103 100 103  --  100 101  CO2  --  16* 23 23  --  23 30  GLUCOSE  --  102* 116* 172*  --  214* 123*  BUN  --  31* 40* 37*  --  45* 43*  CREATININE  --  1.63* 1.89* 1.70* 1.62* 1.68* 1.53*  CALCIUM  --  8.5* 8.2* 8.2*  --  8.4* 8.3*  MG 1.8  --   --   --   --   --   --    GFR: Estimated Creatinine Clearance: 39.3 mL/min (A) (by C-G formula based on SCr of 1.53 mg/dL (H)). Liver Function Tests: Recent Labs  Lab 06/20/23 1401 06/21/23 0429 06/23/23 0530 06/24/23 0516 06/25/23 0530  AST 102* 183*  651* 289* 166*  ALT 43 71* 272* 203* 171*  ALKPHOS 221* 192* 180* 179* 204*  BILITOT 2.1* 2.9* 1.8* 1.7* 1.7*  PROT 7.2 6.8 7.1 6.8 7.6  ALBUMIN 2.9* 2.8* 2.7* 2.6* 2.9*   Recent Labs  Lab 06/20/23 1401  LIPASE 28   No results for input(s): "AMMONIA" in the last 168 hours. Coagulation Profile: Recent Labs  Lab 06/25/23 0530  INR 2.1*   Cardiac Enzymes: Recent Labs  Lab 06/24/23 0516  CKTOTAL 45   BNP (last 3 results) No results for input(s): "PROBNP" in the last 8760 hours. HbA1C: No results for input(s): "HGBA1C" in the last 72 hours. CBG: No results for input(s): "GLUCAP" in the last 168 hours. Lipid Profile: No results for input(s): "CHOL", "HDL", "LDLCALC", "TRIG", "CHOLHDL", "LDLDIRECT" in the last 72 hours. Thyroid Function Tests: No results for input(s): "TSH", "T4TOTAL", "FREET4", "T3FREE", "THYROIDAB" in the last 72 hours. Anemia Panel: No results for input(s): "VITAMINB12",  "FOLATE", "FERRITIN", "TIBC", "IRON", "RETICCTPCT" in the last 72 hours. Most Recent Urinalysis On File:     Component Value Date/Time   COLORURINE STRAW (A) 06/20/2023 2114   APPEARANCEUR CLEAR (A) 06/20/2023 2114   LABSPEC 1.010 06/20/2023 2114   PHURINE 5.0 06/20/2023 2114   GLUCOSEU NEGATIVE 06/20/2023 2114   HGBUR NEGATIVE 06/20/2023 2114   BILIRUBINUR NEGATIVE 06/20/2023 2114   KETONESUR NEGATIVE 06/20/2023 2114   PROTEINUR NEGATIVE 06/20/2023 2114   NITRITE NEGATIVE 06/20/2023 2114   LEUKOCYTESUR NEGATIVE 06/20/2023 2114   Sepsis Labs: @LABRCNTIP (procalcitonin:4,lacticidven:4) Microbiology: Recent Results (from the past 240 hours)  Resp panel by RT-PCR (RSV, Flu A&B, Covid) Anterior Nasal Swab     Status: None   Collection Time: 06/20/23  6:08 PM   Specimen: Anterior Nasal Swab  Result Value Ref Range Status   SARS Coronavirus 2 by RT PCR NEGATIVE NEGATIVE Final    Comment: (NOTE) SARS-CoV-2 target nucleic acids are NOT DETECTED.  The SARS-CoV-2 RNA is generally detectable in upper respiratory specimens during the acute phase of infection. The lowest concentration of SARS-CoV-2 viral copies this assay can detect is 138 copies/mL. A negative result does not preclude SARS-Cov-2 infection and should not be used as the sole basis for treatment or other patient management decisions. A negative result may occur with  improper specimen collection/handling, submission of specimen other than nasopharyngeal swab, presence of viral mutation(s) within the areas targeted by this assay, and inadequate number of viral copies(<138 copies/mL). A negative result must be combined with clinical observations, patient history, and epidemiological information. The expected result is Negative.  Fact Sheet for Patients:  BloggerCourse.com  Fact Sheet for Healthcare Providers:  SeriousBroker.it  This test is no t yet approved or cleared by  the [place] FDA and  has been authorized for detection and/or diagnosis of SARS-CoV-2 by FDA under an Emergency Use Authorization (EUA). This EUA will remain  in effect (meaning this test can be used) for the duration of the COVID-19 declaration under Section 564(b)(1) of the Act, 21 U.S.C.section 360bbb-3(b)(1), unless the authorization is terminated  or revoked sooner.       Influenza A by PCR NEGATIVE NEGATIVE Final   Influenza B by PCR NEGATIVE NEGATIVE Final    Comment: (NOTE) The Xpert Xpress SARS-CoV-2/FLU/RSV plus assay is intended as an aid in the diagnosis of influenza from Nasopharyngeal swab specimens and should not be used as a sole basis for treatment. Nasal washings and aspirates are unacceptable for Xpert Xpress SARS-CoV-2/FLU/RSV testing.  Fact Sheet for Patients:  BloggerCourse.com  Fact Sheet for Healthcare Providers: SeriousBroker.it  This test is not yet approved or cleared by the [place] FDA and has been authorized for detection and/or diagnosis of SARS-CoV-2 by FDA under an Emergency Use Authorization (EUA). This EUA will remain in effect (meaning this test can be used) for the duration of the COVID-19 declaration under Section 564(b)(1) of the Act, 21 U.S.C. section 360bbb-3(b)(1), unless the authorization is terminated or revoked.     Resp Syncytial Virus by PCR NEGATIVE NEGATIVE Final    Comment: (NOTE) Fact Sheet for Patients: BloggerCourse.com  Fact Sheet for Healthcare Providers: SeriousBroker.it  This test is not yet approved or cleared by the [place] FDA and has been authorized for detection and/or diagnosis of SARS-CoV-2 by FDA under an Emergency Use Authorization (EUA). This EUA will remain in effect (meaning this test can be used) for the duration of the COVID-19 declaration under Section 564(b)(1) of the Act, 21  U.S.C. section 360bbb-3(b)(1), unless the authorization is terminated or revoked.  Performed at [facility], [address] [place], [place] [address]   Blood culture (routine x 2)     Status: None   Collection Time: 06/20/23  6:08 PM   Specimen: BLOOD  Result Value Ref Range Status   Specimen Description BLOOD BLOOD RIGHT ARM  Final   Special Requests   Final    BOTTLES DRAWN AEROBIC AND ANAEROBIC Blood Culture results may not be optimal due to an inadequate volume of blood received in culture bottles   Culture   Final    NO GROWTH 5 DAYS Performed at [facility], [address] [place], [place] [address]    Report Status 06/25/2023 FINAL  Final  Blood culture (routine x 2)     Status: None   Collection Time: 06/20/23  6:09 PM   Specimen: BLOOD  Result Value Ref Range Status   Specimen Description BLOOD BLOOD LEFT ARM  Final   Special Requests   Final    BOTTLES DRAWN AEROBIC AND ANAEROBIC Blood Culture results may not be optimal due to an inadequate volume of blood received in culture bottles   Culture   Final    NO GROWTH 5 DAYS Performed at [facility], [address], [place], [place] [address]    Report Status 06/25/2023 FINAL  Final  MRSA Next Gen by PCR, Nasal     Status: None   Collection Time: 06/21/23  4:29 AM   Specimen: Nasal Mucosa; Nasal Swab  Result Value Ref Range Status   MRSA by PCR Next Gen NOT DETECTED NOT DETECTED Final    Comment: (NOTE) The GeneXpert MRSA Assay (FDA approved for NASAL specimens only), is one component of a comprehensive MRSA colonization surveillance program. It is not intended to diagnose MRSA infection nor to guide or monitor treatment for MRSA infections. Test performance is not FDA approved in patients less than 66 years old. Performed at [facility], [address], [place], [place] [address]       Radiology Studies last 3 days: No results found.      Time spent: 50 min      Sunnie Nielsen, DO [facility] 06/26/2023, 3:52 PM    Dictation software may have been used to generate the above note. Typos may occur and escape review in typed/dictated notes. Please contact Dr Lyn Hollingshead directly for clarity if needed.  Staff may message me via secure chat in Epic  but this may not receive an  immediate response,  please page me for urgent matters!  If 7PM-7AM, please contact night coverage [email or web address]

## 2023-06-26 NOTE — Plan of Care (Signed)
  Problem: Education: Goal: Knowledge of General Education information will improve Description: Including pain rating scale, medication(s)/side effects and non-pharmacologic comfort measures Outcome: Progressing   Problem: Clinical Measurements: Goal: Respiratory complications will improve Outcome: Progressing   Problem: Nutrition: Goal: Adequate nutrition will be maintained Outcome: Progressing   Problem: Activity: Goal: Risk for activity intolerance will decrease Outcome: Not Progressing   Problem: Pain Managment: Goal: General experience of comfort will improve and/or be controlled Outcome: Not Progressing

## 2023-06-26 NOTE — Progress Notes (Addendum)
Physical Therapy Treatment Patient Details Name: Tina Peters MRN: [id number] DOB: October 16, 1950 Today's Date: 06/26/2023   History of Present Illness Pt admitted for acute on chronic CHF. Pt with history including Afib, HTN, HLD, CVA and CKD stage 3. Pt recently admitted for similar symptoms.    PT Comments  Patient sitting up in chair on arrival to room. She declined mobility but was agreeable to LE exercises for strengthening performed while sitting in the chair. Encouraged therapeutic exercise for increased independence with functional mobility. Recommend to continue PT to maximize independence and decrease caregiver burden.    If plan is discharge home, recommend the following: A little help with walking and/or transfers;A little help with bathing/dressing/bathroom;Assistance with cooking/housework;Assist for transportation;Help with stairs or ramp for entrance   Can travel by private vehicle        Equipment Recommendations  None recommended by PT    Recommendations for Other Services       Precautions / Restrictions Precautions Precautions: Fall Restrictions Weight Bearing Restrictions Per Provider Order: No     Mobility  Bed Mobility               General bed mobility comments: not assessed as patient sitting up on arrival and post session    Transfers                   General transfer comment: patient declined. she is agreeable to therapeutic exercises in sitting    Ambulation/Gait                   Stairs             Wheelchair Mobility     Tilt Bed    Modified Rankin (Stroke Patients Only)       Balance                                            Communication Communication Communication: No apparent difficulties  Cognition Arousal: Alert Behavior During Therapy: WFL for tasks assessed/performed   PT - Cognitive impairments: No apparent impairments                         Following  commands: Intact      Cueing Cueing Techniques: Verbal cues  Exercises General Exercises - Lower Extremity Ankle Circles/Pumps: AROM, Strengthening, Both, 5 reps, Seated Quad Sets: AROM, Strengthening, Both, 5 reps, Seated Hip ABduction/ADduction: AAROM, Strengthening, Both, 5 reps, Seated Straight Leg Raises: AROM, Strengthening, Both, 5 reps, Seated Other Exercises Other Exercises: verbal cues for technique    General Comments        Pertinent Vitals/Pain Pain Assessment Pain Assessment: Faces Faces Pain Scale: Hurts little more Pain Location: right great toe Pain Descriptors / Indicators: Discomfort Pain Intervention(s): Limited activity within patient's tolerance, Monitored during session    Home Living                          Prior Function            PT Goals (current goals can now be found in the care plan section) Acute Rehab PT Goals Patient Stated Goal: to go home PT Goal Formulation: With patient (care plan extended) Time For Goal Achievement: 07/10/23 Potential to Achieve Goals: Good Progress towards PT goals: Progressing toward goals  Frequency    Min 1X/week      PT Plan      Co-evaluation              AM-PAC PT "6 Clicks" Mobility   Outcome Measure  Help needed turning from your back to your side while in a flat bed without using bedrails?: A Little Help needed moving from lying on your back to sitting on the side of a flat bed without using bedrails?: A Little Help needed moving to and from a bed to a chair (including a wheelchair)?: A Little Help needed standing up from a chair using your arms (e.g., wheelchair or bedside chair)?: A Little Help needed to walk in hospital room?: A Little Help needed climbing 3-5 steps with a railing? : A Little 6 Click Score: 18    End of Session   Activity Tolerance: Patient tolerated treatment well Patient left: in chair;with chair alarm set   PT Visit Diagnosis: Unsteadiness on  feet (R26.81);Muscle weakness (generalized) (M62.81);Other abnormalities of gait and mobility (R26.89)     Time: 1610-9604 PT Time Calculation (min) (ACUTE ONLY): 8 min  Charges:    $Therapeutic Exercise: 8-22 mins PT General Charges $$ ACUTE PT VISIT: 1 Visit                    Donna Bernard, PT, MPT    Ina Homes 06/26/2023, 1:01 PM

## 2023-06-26 NOTE — Progress Notes (Signed)
The patient is transferred to 1 A 56 in stable condition. She was accompanied by 3 patient transport specialists. Report was given to 1A prior before this shift.

## 2023-06-27 DIAGNOSIS — I5033 Acute on chronic diastolic (congestive) heart failure: Secondary | ICD-10-CM | POA: Diagnosis not present

## 2023-06-27 LAB — CBC WITH DIFFERENTIAL/PLATELET
Abs Immature Granulocytes: 0.23 10*3/uL — ABNORMAL HIGH (ref 0.00–0.07)
Basophils Absolute: 0 10*3/uL (ref 0.0–0.1)
Basophils Relative: 0 %
Eosinophils Absolute: 0 10*3/uL (ref 0.0–0.5)
Eosinophils Relative: 0 %
HCT: 29.2 % — ABNORMAL LOW (ref 36.0–46.0)
Hemoglobin: 8.9 g/dL — ABNORMAL LOW (ref 12.0–15.0)
Immature Granulocytes: 2 %
Lymphocytes Relative: 14 %
Lymphs Abs: 1.9 10*3/uL (ref 0.7–4.0)
MCH: 25.6 pg — ABNORMAL LOW (ref 26.0–34.0)
MCHC: 30.5 g/dL (ref 30.0–36.0)
MCV: 83.9 fL (ref 80.0–100.0)
Monocytes Absolute: 1.2 10*3/uL — ABNORMAL HIGH (ref 0.1–1.0)
Monocytes Relative: 9 %
Neutro Abs: 10.1 10*3/uL — ABNORMAL HIGH (ref 1.7–7.7)
Neutrophils Relative %: 75 %
Platelets: 61 10*3/uL — ABNORMAL LOW (ref 150–400)
RBC: 3.48 MIL/uL — ABNORMAL LOW (ref 3.87–5.11)
RDW: 18.4 % — ABNORMAL HIGH (ref 11.5–15.5)
WBC: 13.5 10*3/uL — ABNORMAL HIGH (ref 4.0–10.5)
nRBC: 0.6 % — ABNORMAL HIGH (ref 0.0–0.2)

## 2023-06-27 LAB — COMPREHENSIVE METABOLIC PANEL
ALT: 100 U/L — ABNORMAL HIGH (ref 0–44)
AST: 77 U/L — ABNORMAL HIGH (ref 15–41)
Albumin: 2.8 g/dL — ABNORMAL LOW (ref 3.5–5.0)
Alkaline Phosphatase: 156 U/L — ABNORMAL HIGH (ref 38–126)
Anion gap: 8 (ref 5–15)
BUN: 39 mg/dL — ABNORMAL HIGH (ref 8–23)
CO2: 29 mmol/L (ref 22–32)
Calcium: 8.3 mg/dL — ABNORMAL LOW (ref 8.9–10.3)
Chloride: 100 mmol/L (ref 98–111)
Creatinine, Ser: 1.42 mg/dL — ABNORMAL HIGH (ref 0.44–1.00)
GFR, Estimated: 39 mL/min — ABNORMAL LOW (ref >60)
Glucose, Bld: 108 mg/dL — ABNORMAL HIGH (ref 70–99)
Potassium: 3.6 mmol/L (ref 3.5–5.1)
Sodium: 137 mmol/L (ref 135–145)
Total Bilirubin: 1.6 mg/dL — ABNORMAL HIGH (ref 0.0–1.2)
Total Protein: 6.8 g/dL (ref 6.5–8.1)

## 2023-06-27 MED ORDER — NITROGLYCERIN 0.4 MG SL SUBL: 0.4000 mg | SUBLINGUAL_TABLET | SUBLINGUAL | 0 refills | Status: DC | PRN

## 2023-06-27 MED ORDER — EMPAGLIFLOZIN 10 MG PO TABS: 10.0000 mg | ORAL_TABLET | Freq: Every day | ORAL | 0 refills | Status: DC

## 2023-06-27 MED ORDER — FUROSEMIDE 40 MG PO TABS: 40.0000 mg | ORAL_TABLET | Freq: Every day | ORAL | 0 refills | Status: DC

## 2023-06-27 MED ORDER — COLCHICINE 0.6 MG PO TABS: 0.6000 mg | ORAL_TABLET | Freq: Every day | ORAL | 0 refills | Status: DC

## 2023-06-27 MED ORDER — PREDNISONE 10 MG PO TABS
ORAL_TABLET | ORAL | 0 refills | Status: AC
Start: 2023-06-28 — End: 2023-07-04

## 2023-06-27 MED ORDER — SUCRALFATE 1 GM/10ML PO SUSP: 1.0000 g | Freq: Three times a day (TID) | ORAL | 0 refills | Status: DC

## 2023-06-27 MED ORDER — TRAMADOL HCL 50 MG PO TABS: 50.0000 mg | ORAL_TABLET | Freq: Three times a day (TID) | ORAL | 0 refills | Status: DC | PRN

## 2023-06-27 MED ORDER — DM-GUAIFENESIN ER 30-600 MG PO TB12: 1.0000 | ORAL_TABLET | Freq: Two times a day (BID) | ORAL | 0 refills | Status: DC | PRN

## 2023-06-27 NOTE — Discharge Summary (Signed)
Physician Discharge Summary   Patient: Tina Peters MRN: [id number]  DOB: [birth year]   Admit:     Date of Admission: 06/20/2023 Admitted from: home w/ home health   Discharge: Date of discharge: 06/27/23 Disposition: Home health Condition at discharge: good  CODE STATUS: FULL CODE     Discharge Physician: Sunnie Nielsen, DO [facility]     PCP: Erasmo Downer, MD  Recommendations for Outpatient Follow-up:  Follow up with PCP Beryle Flock Marzella Schlein, MD in 1-2 weeks Follow up as directed w/ cardiology, infectious disease    Discharge Instructions     (HEART FAILURE PATIENTS) Call MD:  Anytime you have any of the following symptoms: 1) 3 pound weight gain in 24 hours or 5 pounds in 1 week 2) shortness of breath, with or without a dry hacking cough 3) swelling in the hands, feet or stomach 4) if you have to sleep on extra pillows at night in order to breathe.   Complete by: As directed    [facility] pharmacist to adjust dose for Vancomycin, Aminoglycosides and other anti-infective therapies as requested by physician.   Complete by: As directed    [facility] to provide Cath Flo 2mg    Complete by: As directed    Administer for PICC line occlusion and as ordered by physician for other access device issues.   Anaphylaxis Kit: Provided to treat any anaphylactic reaction to the medication being provided to the patient if First Dose or when requested by physician   Complete by: As directed    Epinephrine 1mg /ml vial / amp: Administer 0.3mg  (0.1ml) subcutaneously once for moderate to severe anaphylaxis, nurse to call physician and pharmacy when reaction occurs and call 911 if needed for immediate care   Diphenhydramine 50mg /ml IV vial: Administer 25-50mg  IV/IM PRN for first dose reaction, rash, itching, mild reaction, nurse to call physician and pharmacy when reaction occurs   Sodium Chloride 0.9% NS IV: Administer if needed for  hypovolemic blood pressure drop or as ordered by physician after call to physician with anaphylactic reaction   Call MD for:  persistant dizziness or light-headedness   Complete by: As directed    Call MD for:  severe uncontrolled pain   Complete by: As directed    Call MD for:  temperature >100.4   Complete by: As directed    Change dressing on IV access line weekly and PRN   Complete by: As directed    Diet - low sodium heart healthy   Complete by: As directed    Flush IV access with Sodium Chloride 0.9% and Heparin 10 units/ml or 100 units/ml   Complete by: As directed    Home infusion instructions - [facility]   Complete by: As directed    Instructions: Flush IV access with Sodium Chloride 0.9% and Heparin 10units/ml or 100units/ml   Change dressing on IV access line: Weekly and PRN   Instructions Cath Flo 2mg : Administer for PICC Line occlusion and as ordered by physician for other access device   [facility] pharmacist to adjust dose for: Vancomycin, Aminoglycosides and other anti-infective therapies as requested by physician   Increase activity slowly   Complete by: As directed    Method of administration may be changed at the discretion of home infusion pharmacist based upon assessment of the patient and/or caregiver's ability to self-administer the medication ordered   Complete by: As directed    No wound care  Complete by: As directed    Outpatient Parenteral Antibiotic Therapy Information Antibiotic: Ceftriaxone (Rocephin) IVPB; Indications for use: endocarditis; End Date: 07/22/2023   Complete by: As directed    Antibiotic: Ceftriaxone (Rocephin) IVPB   Indications for use: endocarditis   End Date: 07/22/2023         Discharge Diagnoses: Principal Problem:   Acute on chronic diastolic CHF (congestive heart failure) (HCC) Active Problems:   Acute gout   Endocarditis of mitral valve   Pleural effusion   Elevated lactic acid level   Acute  kidney injury superimposed on CKD (HCC)   Chronic a-fib (HCC)   Hyperlipidemia   Essential hypertension   History of CVA (cerebrovascular accident)   Myocardial injury   Lumbar radiculopathy   OSA (obstructive sleep apnea)   Abnormal LFTs   Depression with anxiety   S/P MVR (mitral valve replacement)   Leukocytosis   Shortness of breath   Pain of upper abdomen   Acute on chronic right-sided heart failure (HCC)   Hyponatremia   Obesity, Class III, BMI 40-49.9 (morbid obesity) (HCC)   Iron deficiency anemia   Chronic renal failure (CRF), stage 3b (HCC)   Thrombocytopenia (HCC)   Diverticulosis        Hospital course / significant events:   HPI: 73 y.o. female with medical history significant of A-fib on Eliquis, hypertension, CKD stage IIIa, prior history of stroke, dCHF, hypertension, HLD, depression with anxiety, s/p bioprosthetic AVR in 10/2021, recent admission due to endocarditis on Rocephin via PICC line, lumbar radiculopathy, who presents with shortness breath, chest pain, abdominal pain.  Patient was recently hospitalized from 2/3 - 2/11 due to mitral valve endocarditis and Gemella bacteremia. TEE 02/05 showing mobile opacity on posterior leaflet mitral valve prosthetic valve. Cardiology following, have discussed w/ CT surgery and recs for NO surgery/replacement at this time, favor plan for repeat TEE in 6 weeks to reevaluate mitral valve. Pt is discharged on 6 weeks of IV Rocephin.  PICC line is placed on 2/10.   Pt states that she started having shortness of breath since this morning, which has been progressively worsening.  Patient has cough with clear mucus production.  Also reports left-sided chest pain, no fever or chills.  She has diffused abdominal pain, which is constant, aching, moderate, nonradiating.  She has nausea, dry heaves.  She states that she has some diarrhea recently, which has resolved.  Today no diarrhea.  Denies symptoms of UTI.  She complains of lower  back pain.  Of note, patient had MRI spine L and T spine which had no evidence osteo/discitis.   02/14: pt was found to have BNP 1353, WBC 20.7 (12.5 on 06/17/2023), negative PCR for COVID, flu and RSV, lactic acid 4.8 --> 3.8, temperature normal, blood pressure 118/66, heart rate 90, RR 28, worsening renal function, liver function (ALP 221, AST 102, ALT 43, total bilirubin 2.1.  Patient is admitted to telemetry bed as inpatient. 02/15.  Patient still complains of pain between the shoulder blades.  Added a muscle relaxant and changed her pain medications over to tramadol.  1 dose of morphine given for severe pain. Echo today - EF 55-60%, normal LV fxn, mod reduced RV fxn, no new valve abn, persistent mobile opacity on mitral valve c/w known vegetation  02/16.  Patient complaining severe pain in her right foot and right first toe.  Likely gout secondary to diuresis.  Platelet count 111. 02/17.  Right foot pain much better.  Continue Solu-Medrol.  Cardiology decreased Lasix to daily dosing.  Platelet count 77 02/18.  Right foot pain continues to improve.  Continue Solu-Medrol today and switch over to prednisone for tomorrow.  Continue daily IV Lasix for heart failure.  Continue to watch platelets.  Platelet count 65 02/19: on po lasix and improving.  02/20: per cardiology, ok to dc on po lasix. Confirmed IV abx. Daughter can get her tomorrow - will repeat labs in AM and if still trending appropriately anticipate discharge  02/21: stable for discharge home w/ home health abx and close PCP/cardiology follow up     Consultants:  Cardiology Infectious Disease   Procedures/Surgeries: none      ASSESSMENT & PLAN:   Acute on chronic diastolic CHF (congestive heart failure) (HCC) Consistent with right heart failure and pulmonary hypertension.  Echocardiogram showed decreased right ventricular function and normal left ventricular ejection fraction.   Continue po Lasix daily 40 mg, uptitrate prn and  close follow up  Jardiance.   Cardiology following.   Acute gout Right first toe and right foot secondary to diuresis and kidney impairment.  Pain much better today.   Solu-Medrol changed to prednisone to taper off Continue colchicine.   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  CT scan did not show pneumonia.  Blood cultures negative for 4 days.  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  MRI thoracic and lumbar spine neg last admission was ok   On IV ceftriaxone for 6 weeks until 07/22/23 Outpatient cardiology f/u   Pleural effusion Likely with right-sided heart failure Repeat imaging if deteriorating respiratory status    Elevated lactic acid level Trended in the right direction. Monitor as needed   Acute kidney injury superimposed on CKD3b - improving  Patient has baseline creatinine around 1.02.  Had acute kidney injury on last hospitalization.  Creatinine peaked at 1.89.  Creatinine down to 1.4 Trend BMP   Chronic a-fib (HCC) On Eliquis for anticoagulation and metoprolol for rate control. if platelets drop below 50,000, would favor discontinuation of apixaban    Hyperlipidemia atorvastatin held d/t abnormal LFT   Essential hypertension Continue Lasix and metoprolol   History of CVA (cerebrovascular accident) On aspirin  Held atorvastatin d/t abnormal LFT    Myocardial injury Demand ischemia from CHF To ED for chest pain    Abnormal LFTs Could be secondary to infection.   Right upper quadrant shows gallstones but no evidence of cholecystitis. Biliary sludge due to ceftriaxone causing this picture is remotely possible but very unlikely   Hold Lipitor. Trend CMP Consider MRCP but levels are improving, follow outpatient    OSA (obstructive sleep  apnea) CPAP at night   Depression with anxiety Continue Zoloft   Thrombocytopenia  Question secondary to infection and/or antibiotics.   Protonix discontinued and switched over to Carafate.   Continue to monitor CBC if platelets drop below 50,000, would favor discontinuation of apixaban Follow CBC outpatient     Iron deficiency anemia Hemoglobin 8.8 and ferritin 67. Monitor CBC   Hyponatremia Last sodium normal range Monitor BMP         Class 2 obesity based on BMI: Body mass index is 37.75 kg/m.  Underweight - under 18  overweight - 25 to 29 obese - 30 or more Class 1 obesity: BMI of 30.0 to 34 Class 2 obesity: BMI  of 35.0 to 39 Class 3 obesity: BMI of 40.0 to 49 Super Morbid Obesity: BMI 50-59 Super-super Morbid Obesity: BMI 60+ Significantly low or high BMI is associated with higher medical risk.  Weight management advised as adjunct to other disease management and risk reduction treatments           Discharge Instructions  Allergies as of 06/27/2023   No Known Allergies      Medication List     STOP taking these medications    amiodarone 200 MG tablet Commonly known as: PACERONE   aspirin EC 81 MG tablet   atorvastatin 40 MG tablet Commonly known as: LIPITOR   cefTRIAXone 2 g in sodium chloride 0.9 % 100 mL   ondansetron 4 MG disintegrating tablet Commonly known as: ZOFRAN-ODT       TAKE these medications    acetaminophen 325 MG tablet Commonly known as: TYLENOL Take by mouth. 4 tablets as needed   albuterol 108 (90 Base) MCG/ACT inhaler Commonly known as: VENTOLIN HFA Inhale 2 puffs into the lungs every 6 (six) hours as needed for wheezing or shortness of breath.   apixaban 5 MG Tabs tablet Commonly known as: ELIQUIS Take 1 tablet (5 mg total) by mouth 2 (two) times daily.   cefTRIAXone IVPB Commonly known as: ROCEPHIN Inject 2 g into the vein daily for 27 days. Inject 2 g into the vein daily for 27 days. Indication:   Bacteremia with prosthetic mitral valve endocarditis First Dose: Yes Last Day of Therapy:  07/22/2023 Labs - Once weekly:  CBC/D, CMP, ESR and CRP Fax weekly lab results  promptly to [phone] Method of administration: IV Push Method of administration may be changed at the discretion of home infusion pharmacist based upon assessment of the patient and/or caregiver's ability to self-administer the medication ordered. Please pull PIC at completion of IV antibiotics Call [phone] with any questions or critical value What changed: additional instructions   colchicine 0.6 MG tablet Take 1 tablet (0.6 mg total) by mouth daily. Start taking on: June 28, 2023   dextromethorphan-guaiFENesin 30-600 MG 12hr tablet Commonly known as: MUCINEX DM Take 1 tablet by mouth 2 (two) times daily as needed for cough.   empagliflozin 10 MG Tabs tablet Commonly known as: JARDIANCE Take 1 tablet (10 mg total) by mouth daily. Start taking on: June 28, 2023   furosemide 40 MG tablet Commonly known as: LASIX Take 1 tablet (40 mg total) by mouth daily. Increase to 1 tablet (40 mg total) by mouth TWICE daily (total daily dose 80 mg) as needed for up to 2 days for increased leg swelling, shortness of breath, weight gain 5+ lbs over 1-2 days. Seek medical care / call your cardiologist if these symptoms are not improving with increased dose. Start taking on: June 28, 2023   hydrocortisone 2.5 % cream Apply 1 application topically as needed.   methocarbamol 500 MG tablet Commonly known as: ROBAXIN TAKE 1 TABLET BY MOUTH EVERY 8 HOURS AS NEEDED FOR MUSCLE SPASMS.   metoprolol tartrate 50 MG tablet Commonly known as: LOPRESSOR Take 1 tablet (50 mg total) by mouth 2 (two) times daily.   nitroGLYCERIN 0.4 MG SL tablet Commonly known as: NITROSTAT Place 1 tablet (0.4 mg total) under the tongue every 5 (five) minutes as needed for chest pain.   ondansetron 4 MG tablet Commonly known as:  ZOFRAN Take 1 tablet (4 mg total) by mouth every 6 (six) hours as needed for nausea.   predniSONE  10 MG tablet Commonly known as: DELTASONE Take 2 tablets (20 mg total) by mouth daily with breakfast for 2 days, THEN 1 tablet (10 mg total) daily with breakfast for 2 days, THEN 0.5 tablets (5 mg total) daily with breakfast for 2 days. For gout flare. Start taking on: June 28, 2023   Probiotic (Lactobacillus) Caps Take 1 capsule by mouth in the morning and at bedtime.   sertraline 50 MG tablet Commonly known as: ZOLOFT TAKE 1 TABLET BY MOUTH EVERY DAY   sucralfate 1 GM/10ML suspension Commonly known as: CARAFATE Take 10 mLs (1 g total) by mouth 4 (four) times daily -  with meals and at bedtime.   traMADol 50 MG tablet Commonly known as: ULTRAM Take 1 tablet (50 mg total) by mouth every 8 (eight) hours as needed for severe pain (pain score 7-10) or moderate pain (pain score 4-6).               Discharge Care Instructions  (From admission, onward)           Start     Ordered   06/26/23 0000  Change dressing on IV access line weekly and PRN  (Home infusion instructions - [facility] )        06/26/23 1610             Follow-up Information     [facility]. Go on 07/01/2023.   Specialty: Cardiology Why: Hospital Follow-Up 07/01/23 @ 9:00AM Please bring all medications with you to your appointment [facility], Second floor, [address] Free Valet Parking @ the door Contact information: [address] [place] [address] [phone]                No Known Allergies   Subjective: pt reports feeling well this morning, no concerns fr discharge. Fatigue about same as usual, nochest pain or SOB   Discharge Exam: BP 114/77 (BP Location: Left Arm)   Pulse 96   Temp 98.5 F (36.9 C) (Oral)   Resp 16   Ht 5\' 5"  (1.651 m)   Wt 103.8 kg   SpO2 100%   BMI 38.08 kg/m   General: Pt is alert, awake, not in acute distress Cardiovascular: RRR, S1/S2 +, no rubs, no gallops Respiratory: CTA bilaterally, no wheezing, no rhonchi Abdominal: Soft, NT, ND Extremities: no edema, no cyanosis     The results of significant diagnostics from this hospitalization (including imaging, microbiology, ancillary and laboratory) are listed below for reference.     Microbiology: Recent Results (from the past 240 hours)  Resp panel by RT-PCR (RSV, Flu A&B, Covid) Anterior Nasal Swab     Status: None   Collection Time: 06/20/23  6:08 PM   Specimen: Anterior Nasal Swab  Result Value Ref Range Status   SARS Coronavirus 2 by RT PCR NEGATIVE NEGATIVE Final    Comment: (NOTE) SARS-CoV-2 target nucleic acids are NOT DETECTED.  The SARS-CoV-2 RNA is generally detectable in upper respiratory specimens during the acute phase of infection. The lowest concentration of SARS-CoV-2 viral copies this assay can detect is 138 copies/mL. A negative result does not preclude SARS-Cov-2 infection and should not be used as the sole basis for treatment or other patient management decisions. A negative result may occur with  improper specimen collection/handling, submission of specimen other than nasopharyngeal swab, presence of viral mutation(s) within the areas targeted by this assay, and inadequate number of viral copies(<138  copies/mL). A negative result must be combined with clinical observations, patient history, and epidemiological information. The expected result is Negative.  Fact Sheet for Patients:  BloggerCourse.com  Fact Sheet for Healthcare Providers:  SeriousBroker.it  This test is no t yet approved or cleared by the [place] FDA and  has been authorized for detection and/or diagnosis of SARS-CoV-2 by FDA under an Emergency Use Authorization (EUA). This EUA will remain  in effect (meaning this test can be used) for  the duration of the COVID-19 declaration under Section 564(b)(1) of the Act, 21 U.S.C.section 360bbb-3(b)(1), unless the authorization is terminated  or revoked sooner.       Influenza A by PCR NEGATIVE NEGATIVE Final   Influenza B by PCR NEGATIVE NEGATIVE Final    Comment: (NOTE) The Xpert Xpress SARS-CoV-2/FLU/RSV plus assay is intended as an aid in the diagnosis of influenza from Nasopharyngeal swab specimens and should not be used as a sole basis for treatment. Nasal washings and aspirates are unacceptable for Xpert Xpress SARS-CoV-2/FLU/RSV testing.  Fact Sheet for Patients: BloggerCourse.com  Fact Sheet for Healthcare Providers: SeriousBroker.it  This test is not yet approved or cleared by the [place] FDA and has been authorized for detection and/or diagnosis of SARS-CoV-2 by FDA under an Emergency Use Authorization (EUA). This EUA will remain in effect (meaning this test can be used) for the duration of the COVID-19 declaration under Section 564(b)(1) of the Act, 21 U.S.C. section 360bbb-3(b)(1), unless the authorization is terminated or revoked.     Resp Syncytial Virus by PCR NEGATIVE NEGATIVE Final    Comment: (NOTE) Fact Sheet for Patients: BloggerCourse.com  Fact Sheet for Healthcare Providers: SeriousBroker.it  This test is not yet approved or cleared by the [place] FDA and has been authorized for detection and/or diagnosis of SARS-CoV-2 by FDA under an Emergency Use Authorization (EUA). This EUA will remain in effect (meaning this test can be used) for the duration of the COVID-19 declaration under Section 564(b)(1) of the Act, 21 U.S.C. section 360bbb-3(b)(1), unless the authorization is terminated or revoked.  Performed at [facility], [address] [place], [place] [address]   Blood culture (routine x 2)     Status: None    Collection Time: 06/20/23  6:08 PM   Specimen: BLOOD  Result Value Ref Range Status   Specimen Description BLOOD BLOOD RIGHT ARM  Final   Special Requests   Final    BOTTLES DRAWN AEROBIC AND ANAEROBIC Blood Culture results may not be optimal due to an inadequate volume of blood received in culture bottles   Culture   Final    NO GROWTH 5 DAYS Performed at [facility], [address] [place], [place] [address]    Report Status 06/25/2023 FINAL  Final  Blood culture (routine x 2)     Status: None   Collection Time: 06/20/23  6:09 PM   Specimen: BLOOD  Result Value Ref Range Status   Specimen Description BLOOD BLOOD LEFT ARM  Final   Special Requests   Final    BOTTLES DRAWN AEROBIC AND ANAEROBIC Blood Culture results may not be optimal due to an inadequate volume of blood received in culture bottles   Culture   Final    NO GROWTH 5 DAYS Performed at [facility], [address] [place], [place] [address]    Report Status 06/25/2023 FINAL  Final  MRSA Next Gen by PCR, Nasal     Status: None   Collection  Time: 06/21/23  4:29 AM   Specimen: Nasal Mucosa; Nasal Swab  Result Value Ref Range Status   MRSA by PCR Next Gen NOT DETECTED NOT DETECTED Final    Comment: (NOTE) The GeneXpert MRSA Assay (FDA approved for NASAL specimens only), is one component of a comprehensive MRSA colonization surveillance program. It is not intended to diagnose MRSA infection nor to guide or monitor treatment for MRSA infections. Test performance is not FDA approved in patients less than 76 years old. Performed at [facility], [address] [place], [place] [address]      Labs: BNP (last 3 results) Recent Labs    06/09/23 2153 06/20/23 1400  BNP 720.2* 1,353.9*   Basic Metabolic Panel: Recent Labs  Lab 06/20/23 1808 06/21/23 0429 06/22/23 0440 06/23/23 0531 06/24/23 0516 06/25/23 0530 06/26/23 0613 06/27/23 0843  NA  --    < > 134* 135  --  133* 138  137  K  --    < > 4.2 4.0  --  4.2 4.1 3.6  CL  --    < > 100 103  --  100 101 100  CO2  --    < > 23 23  --  23 30 29   GLUCOSE  --    < > 116* 172*  --  214* 123* 108*  BUN  --    < > 40* 37*  --  45* 43* 39*  CREATININE  --    < > 1.89* 1.70* 1.62* 1.68* 1.53* 1.42*  CALCIUM  --    < > 8.2* 8.2*  --  8.4* 8.3* 8.3*  MG 1.8  --   --   --   --   --   --   --    < > = values in this interval not displayed.   Liver Function Tests: Recent Labs  Lab 06/21/23 0429 06/23/23 0530 06/24/23 0516 06/25/23 0530 06/27/23 0843  AST 183* 651* 289* 166* 77*  ALT 71* 272* 203* 171* 100*  ALKPHOS 192* 180* 179* 204* 156*  BILITOT 2.9* 1.8* 1.7* 1.7* 1.6*  PROT 6.8 7.1 6.8 7.6 6.8  ALBUMIN 2.8* 2.7* 2.6* 2.9* 2.8*   No results for input(s): "LIPASE", "AMYLASE" in the last 168 hours. No results for input(s): "AMMONIA" in the last 168 hours. CBC: Recent Labs  Lab 06/23/23 0531 06/24/23 0516 06/25/23 0530 06/26/23 0613 06/27/23 0843  WBC 18.0* 17.6* 14.6* 12.0* 13.5*  NEUTROABS  --   --  12.7*  --  10.1*  HGB 8.8* 8.8* 8.6* 8.7* 8.9*  HCT 28.3* 28.6* 27.6* 29.0* 29.2*  MCV 84.2 85.1 82.4 83.8 83.9  PLT 77* 65* 93* 75* 61*   Cardiac Enzymes: Recent Labs  Lab 06/24/23 0516  CKTOTAL 45   BNP: Invalid input(s): "POCBNP" CBG: No results for input(s): "GLUCAP" in the last 168 hours. D-Dimer No results for input(s): "DDIMER" in the last 72 hours. Hgb A1c No results for input(s): "HGBA1C" in the last 72 hours. Lipid Profile No results for input(s): "CHOL", "HDL", "LDLCALC", "TRIG", "CHOLHDL", "LDLDIRECT" in the last 72 hours. Thyroid function studies No results for input(s): "TSH", "T4TOTAL", "T3FREE", "THYROIDAB" in the last 72 hours.  Invalid input(s): "FREET3" Anemia work up No results for input(s): "VITAMINB12", "FOLATE", "FERRITIN", "TIBC", "IRON", "RETICCTPCT" in the last 72 hours. Urinalysis    Component Value Date/Time   COLORURINE STRAW (A) 06/20/2023 2114    APPEARANCEUR CLEAR (A) 06/20/2023 2114   LABSPEC 1.010 06/20/2023 2114  PHURINE 5.0 06/20/2023 2114   GLUCOSEU NEGATIVE 06/20/2023 2114   HGBUR NEGATIVE 06/20/2023 2114   BILIRUBINUR NEGATIVE 06/20/2023 2114   KETONESUR NEGATIVE 06/20/2023 2114   PROTEINUR NEGATIVE 06/20/2023 2114   NITRITE NEGATIVE 06/20/2023 2114   LEUKOCYTESUR NEGATIVE 06/20/2023 2114   Sepsis Labs Recent Labs  Lab 06/24/23 0516 06/25/23 0530 06/26/23 0613 06/27/23 0843  WBC 17.6* 14.6* 12.0* 13.5*   Microbiology Recent Results (from the past 240 hours)  Resp panel by RT-PCR (RSV, Flu A&B, Covid) Anterior Nasal Swab     Status: None   Collection Time: 06/20/23  6:08 PM   Specimen: Anterior Nasal Swab  Result Value Ref Range Status   SARS Coronavirus 2 by RT PCR NEGATIVE NEGATIVE Final    Comment: (NOTE) SARS-CoV-2 target nucleic acids are NOT DETECTED.  The SARS-CoV-2 RNA is generally detectable in upper respiratory specimens during the acute phase of infection. The lowest concentration of SARS-CoV-2 viral copies this assay can detect is 138 copies/mL. A negative result does not preclude SARS-Cov-2 infection and should not be used as the sole basis for treatment or other patient management decisions. A negative result may occur with  improper specimen collection/handling, submission of specimen other than nasopharyngeal swab, presence of viral mutation(s) within the areas targeted by this assay, and inadequate number of viral copies(<138 copies/mL). A negative result must be combined with clinical observations, patient history, and epidemiological information. The expected result is Negative.  Fact Sheet for Patients:  BloggerCourse.com  Fact Sheet for Healthcare Providers:  SeriousBroker.it  This test is no t yet approved or cleared by the [place] FDA and  has been authorized for detection and/or diagnosis of SARS-CoV-2 by FDA under an  Emergency Use Authorization (EUA). This EUA will remain  in effect (meaning this test can be used) for the duration of the COVID-19 declaration under Section 564(b)(1) of the Act, 21 U.S.C.section 360bbb-3(b)(1), unless the authorization is terminated  or revoked sooner.       Influenza A by PCR NEGATIVE NEGATIVE Final   Influenza B by PCR NEGATIVE NEGATIVE Final    Comment: (NOTE) The Xpert Xpress SARS-CoV-2/FLU/RSV plus assay is intended as an aid in the diagnosis of influenza from Nasopharyngeal swab specimens and should not be used as a sole basis for treatment. Nasal washings and aspirates are unacceptable for Xpert Xpress SARS-CoV-2/FLU/RSV testing.  Fact Sheet for Patients: BloggerCourse.com  Fact Sheet for Healthcare Providers: SeriousBroker.it  This test is not yet approved or cleared by the [place] FDA and has been authorized for detection and/or diagnosis of SARS-CoV-2 by FDA under an Emergency Use Authorization (EUA). This EUA will remain in effect (meaning this test can be used) for the duration of the COVID-19 declaration under Section 564(b)(1) of the Act, 21 U.S.C. section 360bbb-3(b)(1), unless the authorization is terminated or revoked.     Resp Syncytial Virus by PCR NEGATIVE NEGATIVE Final    Comment: (NOTE) Fact Sheet for Patients: BloggerCourse.com  Fact Sheet for Healthcare Providers: SeriousBroker.it  This test is not yet approved or cleared by the [place] FDA and has been authorized for detection and/or diagnosis of SARS-CoV-2 by FDA under an Emergency Use Authorization (EUA). This EUA will remain in effect (meaning this test can be used) for the duration of the COVID-19 declaration under Section 564(b)(1) of the Act, 21 U.S.C. section 360bbb-3(b)(1), unless the authorization is terminated or revoked.  Performed at [facility], [address], [place], [place] [address]   Blood  culture (routine x 2)     Status: None   Collection Time: 06/20/23  6:08 PM   Specimen: BLOOD  Result Value Ref Range Status   Specimen Description BLOOD BLOOD RIGHT ARM  Final   Special Requests   Final    BOTTLES DRAWN AEROBIC AND ANAEROBIC Blood Culture results may not be optimal due to an inadequate volume of blood received in culture bottles   Culture   Final    NO GROWTH 5 DAYS Performed at [facility], [address] [place], [place] [address]    Report Status 06/25/2023 FINAL  Final  Blood culture (routine x 2)     Status: None   Collection Time: 06/20/23  6:09 PM   Specimen: BLOOD  Result Value Ref Range Status   Specimen Description BLOOD BLOOD LEFT ARM  Final   Special Requests   Final    BOTTLES DRAWN AEROBIC AND ANAEROBIC Blood Culture results may not be optimal due to an inadequate volume of blood received in culture bottles   Culture   Final    NO GROWTH 5 DAYS Performed at [facility], [address] [place], [place] [address]    Report Status 06/25/2023 FINAL  Final  MRSA Next Gen by PCR, Nasal     Status: None   Collection Time: 06/21/23  4:29 AM   Specimen: Nasal Mucosa; Nasal Swab  Result Value Ref Range Status   MRSA by PCR Next Gen NOT DETECTED NOT DETECTED Final    Comment: (NOTE) The GeneXpert MRSA Assay (FDA approved for NASAL specimens only), is one component of a comprehensive MRSA colonization surveillance program. It is not intended to diagnose MRSA infection nor to guide or monitor treatment for MRSA infections. Test performance is not FDA approved in patients less than 38 years old. Performed at [facility], [address] [place], [place] [address]    Imaging ECHOCARDIOGRAM LIMITED Result Date: 06/21/2023    ECHOCARDIOGRAM LIMITED REPORT   Patient Name:   BRAIDEN PRESUTTI Date of Exam: 06/21/2023 Medical Rec #:  [id number]       Height:       65.0 in  Accession #:    [id number]      Weight:       224.9 lb Date of Birth:  1950/05/19       BSA:          2.079 m Patient Age:    72 years        BP:           126/76 mmHg Patient Gender: F               HR:           89 bpm. Exam Location:  [facility] Procedure: Limited Echo, Limited Color Doppler and Cardiac Doppler (Both            Spectral and Color Flow Doppler were utilized during procedure). Indications:     MV disease; Endocarditis  History:         Patient has prior history of Echocardiogram examinations, most                  recent 06/10/2023. CHF, Stroke, Mitral Valve Disease,                  Arrythmias:Atrial Fibrillation; Risk Factors:Dyslipidemia,                  Sleep Apnea and Former  Smoker. S/P Bovine mitral valve                  replacement.  Sonographer:     Dondra Prader RVT RCS Referring Phys:  [id number] Antonieta Iba Diagnosing Phys: Julien Nordmann MD  Sonographer Comments: Patient is obese. Image acquisition challenging due to patient body habitus. Patient unable to tolerate exam. IMPRESSIONS  1. Left ventricular ejection fraction, by estimation, is 55 to 60%. The left ventricle has normal function. The left ventricle has no regional wall motion abnormalities.  2. Right ventricular systolic function is moderately reduced. The right ventricular size is moderately enlarged. Tricuspid regurgitation signal is inadequate for assessing PA pressure.  3. Mitral valve mean gradient not measured.. The mitral valve has been repaired/replaced. Mild mitral valve regurgitation. Small mobile opacity again noted on leaflets concerning for valve vegetation.  4. The aortic valve is normal in structure. There is mild calcification of the aortic valve. Aortic valve regurgitation is not visualized. Aortic valve sclerosis is present, with no evidence of aortic valve stenosis. FINDINGS  Left Ventricle: Left ventricular ejection fraction, by estimation, is 55 to 60%. The left ventricle has normal function. The left ventricle has  no regional wall motion abnormalities. The left ventricular internal cavity size was normal in size. There is  no left ventricular hypertrophy. Right Ventricle: The right ventricular size is moderately enlarged. No increase in right ventricular wall thickness. Right ventricular systolic function is moderately reduced. Tricuspid regurgitation signal is inadequate for assessing PA pressure. Left Atrium: Left atrial size was normal in size. Right Atrium: Right atrial size was normal in size. Pericardium: There is no evidence of pericardial effusion. Mitral Valve: Mitral valve mean gradient not measured. The mitral valve has been repaired/replaced. There is severe thickening of the mitral valve leaflet(s). Mild mitral valve regurgitation. There is a bioprosthetic valve present in the mitral position. Tricuspid Valve: The tricuspid valve is normal in structure. Tricuspid valve regurgitation is not demonstrated. No evidence of tricuspid stenosis. Aortic Valve: The aortic valve is normal in structure. There is mild calcification of the aortic valve. Aortic valve regurgitation is not visualized. Aortic valve sclerosis is present, with no evidence of aortic valve stenosis. Pulmonic Valve: The pulmonic valve was normal in structure. Pulmonic valve regurgitation is not visualized. No evidence of pulmonic stenosis. Aorta: The aortic root is normal in size and structure. Venous: The inferior vena cava was not well visualized. IAS/Shunts: No atrial level shunt detected by color flow Doppler. Julien Nordmann MD Electronically signed by Julien Nordmann MD Signature Date/Time: 06/21/2023/2:51:30 PM    Final    US Abdomen Limited RUQ (LIVER/GB) Result Date: 06/21/2023 CLINICAL DATA:  Abdominal pain EXAM: ULTRASOUND ABDOMEN LIMITED RIGHT UPPER QUADRANT COMPARISON:  06/10/2023 FINDINGS: Gallbladder: Cholelithiasis is again observed with mobile shadowing gallstones measuring up to 13 mm in length. No gallbladder wall thickening.  Sonographic Murphy's sign absent. No pericholecystic fluid. Common bile duct: Diameter: 0.4 cm Liver: No focal lesion identified. Within normal limits in parenchymal echogenicity. Portal vein is patent on color Doppler imaging with normal direction of blood flow towards the liver. Other: Right pleural effusion observed. IMPRESSION: 1. Cholelithiasis without findings of acute cholecystitis. 2. Right pleural effusion. Electronically Signed   By: Gaylyn Rong M.D.   On: 06/21/2023 10:06      Time coordinating discharge: over 30 minutes  SIGNED:  Sunnie Nielsen DO [facility]

## 2023-06-27 NOTE — TOC Transition Note (Signed)
Transition of Care [facility]) - Discharge Note   Patient Details  Name: Tina Peters MRN: [id number] Date of Birth: 14-Jun-1950  Transition of Care [facility]) CM/SW Contact:  Marlowe Sax, RN Phone Number: 06/27/2023, 10:48 AM   Clinical Narrative:     The patient is to go home with her daughter and will have HH with [facility], she will have IV ABX With [facility], she has DME at home, daughter to transport    Barriers to Discharge: Continued Medical Work up   Patient Goals and CMS Choice            Discharge Placement                       Discharge Plan and Services Additional resources added to the After Visit Summary for       Post Acute Care Choice: Resumption of Svcs/PTA Provider                               Social Drivers of Health (SDOH) Interventions SDOH Screenings   Food Insecurity: No Food Insecurity (06/21/2023)  Housing: Low Risk  (06/23/2023)  Transportation Needs: No Transportation Needs (06/23/2023)  Utilities: Not At Risk (06/21/2023)  Alcohol Screen: Low Risk  (01/27/2023)  Depression (PHQ2-9): Medium Risk (01/27/2023)  Financial Resource Strain: Medium Risk (06/23/2023)  Physical Activity: Insufficiently Active (01/27/2023)  Social Connections: Socially Isolated (06/21/2023)  Stress: Stress Concern Present (01/27/2023)  Tobacco Use: Medium Risk (06/23/2023)  Health Literacy: Adequate Health Literacy (01/27/2023)     Readmission Risk Interventions    06/25/2023    1:27 PM  Readmission Risk Prevention Plan  Transportation Screening Complete  Medication Review (RN Care Manager) Complete  PCP or Specialist appointment within 3-5 days of discharge Complete  HRI or Home Care Consult Complete  SW Recovery Care/Counseling Consult Complete  Palliative Care Screening Not Applicable  Skilled Nursing Facility Not Applicable

## 2023-06-28 DIAGNOSIS — T826XXA Infection and inflammatory reaction due to cardiac valve prosthesis, initial encounter: Secondary | ICD-10-CM | POA: Diagnosis not present

## 2023-06-29 DIAGNOSIS — Z452 Encounter for adjustment and management of vascular access device: Secondary | ICD-10-CM | POA: Diagnosis not present

## 2023-06-29 DIAGNOSIS — I471 Supraventricular tachycardia, unspecified: Secondary | ICD-10-CM | POA: Diagnosis not present

## 2023-06-29 DIAGNOSIS — M5416 Radiculopathy, lumbar region: Secondary | ICD-10-CM | POA: Diagnosis not present

## 2023-06-29 DIAGNOSIS — G4733 Obstructive sleep apnea (adult) (pediatric): Secondary | ICD-10-CM | POA: Diagnosis not present

## 2023-06-29 DIAGNOSIS — E785 Hyperlipidemia, unspecified: Secondary | ICD-10-CM | POA: Diagnosis not present

## 2023-06-29 DIAGNOSIS — I130 Hypertensive heart and chronic kidney disease with heart failure and stage 1 through stage 4 chronic kidney disease, or unspecified chronic kidney disease: Secondary | ICD-10-CM | POA: Diagnosis not present

## 2023-06-29 DIAGNOSIS — T827XXA Infection and inflammatory reaction due to other cardiac and vascular devices, implants and grafts, initial encounter: Secondary | ICD-10-CM | POA: Diagnosis not present

## 2023-06-29 DIAGNOSIS — N1831 Chronic kidney disease, stage 3a: Secondary | ICD-10-CM | POA: Diagnosis not present

## 2023-06-29 DIAGNOSIS — Z7901 Long term (current) use of anticoagulants: Secondary | ICD-10-CM | POA: Diagnosis not present

## 2023-06-29 DIAGNOSIS — I5033 Acute on chronic diastolic (congestive) heart failure: Secondary | ICD-10-CM | POA: Diagnosis not present

## 2023-06-29 DIAGNOSIS — E66812 Obesity, class 2: Secondary | ICD-10-CM | POA: Diagnosis not present

## 2023-06-29 DIAGNOSIS — Z792 Long term (current) use of antibiotics: Secondary | ICD-10-CM | POA: Diagnosis not present

## 2023-06-29 DIAGNOSIS — I5A Non-ischemic myocardial injury (non-traumatic): Secondary | ICD-10-CM | POA: Diagnosis not present

## 2023-06-29 DIAGNOSIS — J45909 Unspecified asthma, uncomplicated: Secondary | ICD-10-CM | POA: Diagnosis not present

## 2023-06-29 DIAGNOSIS — I495 Sick sinus syndrome: Secondary | ICD-10-CM | POA: Diagnosis not present

## 2023-06-29 DIAGNOSIS — I480 Paroxysmal atrial fibrillation: Secondary | ICD-10-CM | POA: Diagnosis not present

## 2023-06-29 DIAGNOSIS — B9689 Other specified bacterial agents as the cause of diseases classified elsewhere: Secondary | ICD-10-CM | POA: Diagnosis not present

## 2023-06-29 DIAGNOSIS — M1990 Unspecified osteoarthritis, unspecified site: Secondary | ICD-10-CM | POA: Diagnosis not present

## 2023-06-29 DIAGNOSIS — I5022 Chronic systolic (congestive) heart failure: Secondary | ICD-10-CM | POA: Diagnosis not present

## 2023-06-29 DIAGNOSIS — Z87891 Personal history of nicotine dependence: Secondary | ICD-10-CM | POA: Diagnosis not present

## 2023-06-29 DIAGNOSIS — Z6836 Body mass index (BMI) 36.0-36.9, adult: Secondary | ICD-10-CM | POA: Diagnosis not present

## 2023-06-29 DIAGNOSIS — I10 Essential (primary) hypertension: Secondary | ICD-10-CM | POA: Diagnosis not present

## 2023-06-29 DIAGNOSIS — I330 Acute and subacute infective endocarditis: Secondary | ICD-10-CM | POA: Diagnosis not present

## 2023-06-29 DIAGNOSIS — Z8673 Personal history of transient ischemic attack (TIA), and cerebral infarction without residual deficits: Secondary | ICD-10-CM | POA: Diagnosis not present

## 2023-06-29 DIAGNOSIS — F418 Other specified anxiety disorders: Secondary | ICD-10-CM | POA: Diagnosis not present

## 2023-06-29 LAB — CULTURE, BLOOD (SINGLE)

## 2023-06-29 NOTE — Progress Notes (Unsigned)
 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   She has a history of PSVT dating back to 2016, which was thought to be secondary to hypokalemia in the setting of HCTZ usage. This was managed with beta-blocker therapy (diltiazem caused leg edema). In January 2018, she was diagnosed with atrial fibrillation with RVR and converted to sinus rhythm on diltiazem. She was placed on Xarelto. In October 2021, she was seen in cardiology clinic with presyncope and acute confusion and loss of fine motor skills. She was sent to the emergency department where MRI showed multifocal acute ischemia in the right MCA territory predominantly involving the posterior insula and right middle frontal gyrus. MRA and carotid Dopplers were unremarkable. Xarelto was resumed once cleared by neurology. She then underwent ZIO monitoring which showed predominantly sinus rhythm with a 24% atrial fibrillation burden and she was noted to have a 4.7 postconversion pause. She was subsequently seen by electrophysiology and placed on Multaq to avoid recurrent A-fib and thus postconversion pauses.   In June 2023, she was admitted to [facility] with Streptococcus sanguinous bacteremia from infected tooth and was found to have a mitral valve vegetation.  Diagnostic catheterization showed mild, nonobstructive CAD.  She had a prolonged hospitalization underwent mitral valve replacement with 27 mm Mitris bioprosthetic valve, Maze procedure, and left atrial appendage clip. Postoperatively, she had acute left lower extremity ischemia which required left common femoral artery exposure,  thrombectomy of the left iliac, common femoral, superficial femoral, and profunda arteries as well as 4 compartment fasciotomy.  Postop echo showed EF of 40 to 45%. She also had postoperative atrial fibrillation which required cardioversion and she was switched from Multaq to amiodarone. Cardioverted April 18, 2022.   Admitted 06/20/23      Review of Systems: [y] = yes, [ ]  = no   General: Weight gain [ ] ; Weight loss [ ] ; Anorexia [ ] ; Fatigue [ ] ; Fever [ ] ; Chills [ ] ; Weakness [ ]   Cardiac: Chest pain/pressure [ ] ; Resting SOB [ ] ; Exertional SOB [ ] ; Orthopnea [ ] ; Pedal Edema [ ] ; Palpitations [ ] ; Syncope [ ] ; Presyncope [ ] ; Paroxysmal nocturnal dyspnea[ ]   Pulmonary: Cough [ ] ; Wheezing[ ] ; Hemoptysis[ ] ; Sputum [ ] ; Snoring [ ]   GI: Vomiting[ ] ; Dysphagia[ ] ; Melena[ ] ; Hematochezia [ ] ; Heartburn[ ] ; Abdominal pain [ ] ; Constipation [ ] ; Diarrhea [ ] ; BRBPR [ ]   GU: Hematuria[ ] ; Dysuria [ ] ; Nocturia[ ]   Vascular: Pain in legs with walking [ ] ; Pain in feet with lying flat [ ] ; Non-healing sores [ ] ; Stroke [ ] ; TIA [ ] ; Slurred speech [ ] ;  Neuro: Headaches[ ] ; Vertigo[ ] ; Seizures[ ] ; Paresthesias[ ] ;Blurred vision [ ] ; Diplopia [ ] ; Vision changes [ ]   Ortho/Skin: Arthritis [ ] ; Joint pain [ ] ; Muscle pain [ ] ; Joint swelling [ ] ; Back Pain [ ] ; Rash [ ]   Psych: Depression[ ] ; Anxiety[ ]   Heme: Bleeding problems [ ] ; Clotting disorders [ ] ; Anemia [ ]   Endocrine: Diabetes [ ] ; Thyroid dysfunction[ ]    Past Medical History:  Diagnosis Date   Allergy    Anxiety    Arthritis  Asthma    Chronic back pain    Chronic combined systolic (congestive) and diastolic (congestive) heart failure (HCC)    a. 02/2020 Echo: EF 60-65%, no rwma. Gr2 DD. Nl RV size/fxn. Mild LAE. Mild MR; b. 10/2021 Echo: [facility] following MVR) EF 40 to 45%.   Depression    Diverticulitis    Endocarditis    a. 10/2021 S sanguinous bacteremia w/ MV veg-->75mm Mitris bioprosthetic valve, Maze, and LAA clip.    Hyperlipidemia    Hypertension    Lipoma    Mild mitral regurgitation    a. 08/2016 Echo: mild MR; b. 02/2020 Echo: Mild MR.   Neuromuscular disorder (HCC)    Obesity    Obstructive sleep apnea    PAF (paroxysmal atrial fibrillation) (HCC)    a. Dx 05/2016--> Xarelto (CHA2DS2VASc = 3); b. 08/2016 Echo: EF 55-60%, no rwma, mild MR, mod dil LA; c. 03/2020 Zio: Avg HR 61 w/ Afib burden of 24% - max rate 189 (avg 93). 2 pauses - longest 4.7 secs post-conversion pauses (occurred @ 5:42 am); d. 04/2022 s/p DCCV; e. 08/2022 s/p DCCV.   Paroxysmal SVT (supraventricular tachycardia) (HCC)    S/P mitral valve replacement    a. 10/2021 [facility] - Endocarditis/MV Veg-->67mm Mitris bioprosthetic valve, Maze, and LAA clip.   Sleep apnea    Stroke [facility])    a. 02/2020 MRI/A: multifocal acute ischemia in R MCA territory predominantly involving post insula and R middle frontal gyrus. Nl MRA.   Tachy-brady syndrome (HCC)    Tobacco abuse     Current Outpatient Medications  Medication Sig Dispense Refill   acetaminophen (TYLENOL) 325 MG tablet Take by mouth. 4 tablets as needed     albuterol (VENTOLIN HFA) 108 (90 Base) MCG/ACT inhaler Inhale 2 puffs into the lungs every 6 (six) hours as needed for wheezing or shortness of breath. 18 g 3   apixaban (ELIQUIS) 5 MG TABS tablet Take 1 tablet (5 mg total) by mouth 2 (two) times daily. 60 tablet 0   cefTRIAXone (ROCEPHIN) IVPB Inject 2 g into the vein daily for 27 days. Inject 2 g into the vein daily for 27 days. Indication:  Bacteremia with prosthetic mitral valve endocarditis First Dose: Yes Last Day of Therapy:  07/22/2023 Labs - Once weekly:  CBC/D, CMP, ESR and CRP Fax weekly lab results  promptly to [phone] Method of administration: IV Push Method of administration may be changed at the discretion of home infusion pharmacist based upon assessment of the patient and/or caregiver's ability to self-administer the medication ordered. Please pull PIC at  completion of IV antibiotics Call [phone] with any questions or critical value 28 Units 0   colchicine 0.6 MG tablet Take 1 tablet (0.6 mg total) by mouth daily. 30 tablet 0   dextromethorphan-guaiFENesin (MUCINEX DM) 30-600 MG 12hr tablet Take 1 tablet by mouth 2 (two) times daily as needed for cough. 30 tablet 0   empagliflozin (JARDIANCE) 10 MG TABS tablet Take 1 tablet (10 mg total) by mouth daily. 30 tablet 0   furosemide (LASIX) 40 MG tablet Take 1 tablet (40 mg total) by mouth daily. Increase to 1 tablet (40 mg total) by mouth TWICE daily (total daily dose 80 mg) as needed for up to 2 days for increased leg swelling, shortness of breath, weight gain 5+ lbs over 1-2 days. Seek medical care / call your cardiologist if these symptoms are not improving with increased dose. 45 tablet 0   hydrocortisone 2.5 % cream  Apply 1 application topically as needed.     methocarbamol (ROBAXIN) 500 MG tablet TAKE 1 TABLET BY MOUTH EVERY 8 HOURS AS NEEDED FOR MUSCLE SPASMS. 90 tablet 1   metoprolol tartrate (LOPRESSOR) 50 MG tablet Take 1 tablet (50 mg total) by mouth 2 (two) times daily. 60 tablet 0   nitroGLYCERIN (NITROSTAT) 0.4 MG SL tablet Place 1 tablet (0.4 mg total) under the tongue every 5 (five) minutes as needed for chest pain. 30 tablet 0   ondansetron (ZOFRAN) 4 MG tablet Take 1 tablet (4 mg total) by mouth every 6 (six) hours as needed for nausea. 20 tablet 0   predniSONE (DELTASONE) 10 MG tablet Take 2 tablets (20 mg total) by mouth daily with breakfast for 2 days, THEN 1 tablet (10 mg total) daily with breakfast for 2 days, THEN 0.5 tablets (5 mg total) daily with breakfast for 2 days. For gout flare. 7 tablet 0   Probiotic, Lactobacillus, CAPS Take 1 capsule by mouth in the morning and at bedtime. 60 capsule 0   sertraline (ZOLOFT) 50 MG tablet TAKE 1 TABLET BY MOUTH EVERY DAY 90 tablet 1   sucralfate (CARAFATE) 1 GM/10ML suspension Take 10 mLs (1 g total) by mouth 4 (four) times daily -   with meals and at bedtime. 420 mL 0   traMADol (ULTRAM) 50 MG tablet Take 1 tablet (50 mg total) by mouth every 8 (eight) hours as needed for severe pain (pain score 7-10) or moderate pain (pain score 4-6). 15 tablet 0   No current facility-administered medications for this visit.    No Known Allergies    Social History   Socioeconomic History   Marital status: Legally Separated    Spouse name: Not on file   Number of children: 2   Years of education: Not on file   Highest education level: Associate degree: occupational, Scientist, product/process development, or vocational program  Occupational History   Occupation: retired  Tobacco Use   Smoking status: Former    Current packs/day: 0.00    Average packs/day: 0.3 packs/day for 40.0 years (10.0 ttl pk-yrs)    Types: Cigarettes    Start date: 13    Quit date: 2015    Years since quitting: 10.1   Smokeless tobacco: Never   Tobacco comments:    Used to smoke heavily, cut back in 2015  Vaping Use   Vaping status: Never Used  Substance and Sexual Activity   Alcohol use: Yes    Comment: socially, rare intake   Drug use: No   Sexual activity: Yes    Partners: Male    Birth control/protection: Post-menopausal  Other Topics Concern   Not on file  Social History Narrative   Lives alone.   Social Drivers of Health   Financial Resource Strain: Medium Risk (06/23/2023)   Overall Financial Resource Strain (CARDIA)    Difficulty of Paying Living Expenses: Somewhat hard  Food Insecurity: No Food Insecurity (06/21/2023)   Hunger Vital Sign    Worried About Running Out of Food in the Last Year: Never true    Ran Out of Food in the Last Year: Never true  Transportation Needs: No Transportation Needs (06/23/2023)   PRAPARE - Administrator, Civil Service (Medical): No    Lack of Transportation (Non-Medical): No  Physical Activity: Insufficiently Active (01/27/2023)   Exercise Vital Sign    Days of Exercise per Week: 1 day    Minutes of Exercise  per Session: 20 min  Stress: Stress Concern Present (01/27/2023)   [employer] of Occupational Health - Occupational Stress Questionnaire    Feeling of Stress : To some extent  Social Connections: Socially Isolated (06/21/2023)   Social Connection and Isolation Panel [NHANES]    Frequency of Communication with Friends and Family: More than three times a week    Frequency of Social Gatherings with Friends and Family: Twice a week    Attends Religious Services: Never    Database administrator or Organizations: No    Attends Banker Meetings: Never    Marital Status: Divorced  Catering manager Violence: Not At Risk (06/21/2023)   Humiliation, Afraid, Rape, and Kick questionnaire    Fear of Current or Ex-Partner: No    Emotionally Abused: No    Physically Abused: No    Sexually Abused: No      Family History  Problem Relation Age of Onset   Stroke Mother    Diabetes Mother    Fibrocystic breast disease Mother    CAD Father    Alcoholism Father    Fibrocystic breast disease Sister    Diabetes Mellitus I Grandson    Breast cancer Neg Hx    Colon cancer Neg Hx     There were no vitals filed for this visit.   PHYSICAL EXAM: General:  Well appearing. No respiratory difficulty HEENT: normal Neck: supple. no JVD. Carotids 2+ bilat; no bruits. No lymphadenopathy or thyromegaly appreciated. Cor: PMI nondisplaced. Regular rate & rhythm. No rubs, gallops or murmurs. Lungs: clear Abdomen: soft, nontender, nondistended. No hepatosplenomegaly. No bruits or masses. Good bowel sounds. Extremities: no cyanosis, clubbing, rash, edema Neuro: alert & oriented x 3, cranial nerves grossly intact. moves all 4 extremities w/o difficulty. Affect pleasant.  ECG:   ASSESSMENT & PLAN:     Delma Freeze, FNP 06/29/23

## 2023-06-30 ENCOUNTER — Ambulatory Visit: Payer: Self-pay | Admitting: Family Medicine

## 2023-06-30 ENCOUNTER — Other Ambulatory Visit: Payer: Self-pay | Admitting: Family Medicine

## 2023-06-30 ENCOUNTER — Telehealth: Payer: Self-pay | Admitting: Family

## 2023-06-30 ENCOUNTER — Telehealth: Payer: Self-pay

## 2023-06-30 MED ORDER — ALBUTEROL SULFATE HFA 108 (90 BASE) MCG/ACT IN AERS: 2.0000 | INHALATION_SPRAY | Freq: Four times a day (QID) | RESPIRATORY_TRACT | 3 refills | Status: DC | PRN

## 2023-06-30 NOTE — Telephone Encounter (Signed)
 Lvm to confirm appt for 07/01/23

## 2023-06-30 NOTE — Transitions of Care (Post Inpatient/ED Visit) (Signed)
 06/30/2023  Name: Tina Peters MRN: [id number] DOB: April 27, 1951  Today's TOC FU Call Status: Today's TOC FU Call Status:: Successful TOC FU Call Completed TOC FU Call Complete Date: 06/30/23 Patient's Name and Date of Birth confirmed.  Transition Care Management Follow-up Telephone Call Date of Discharge: 06/30/23 Discharge Facility: [facility]) Type of Discharge: Inpatient Admission Primary Inpatient Discharge Diagnosis:: CHF How have you been since you were released from the hospital?: Better Any questions or concerns?: No  Items Reviewed: Did you receive and understand the discharge instructions provided?: Yes Medications obtained,verified, and reconciled?: Yes (Medications Reviewed) Any new allergies since your discharge?: No Dietary orders reviewed?: Yes Type of Diet Ordered:: Low sodium heart healthy Do you have support at home?: Yes People in Home: alone Name of Support/Comfort Primary Source: [name]  Medications Reviewed Today: Medications Reviewed Today     Reviewed by Redge Gainer, RN (Case Manager) on 06/30/23 at 1532  Med List Status: <None>   Medication Order Taking? Sig Documenting Provider Last Dose Status Informant  acetaminophen (TYLENOL) 325 MG tablet [id number] No Take by mouth. 4 tablets as needed [provider] 06/09/2023 Active Child           Med Note Sharia Reeve   Fri Jun 20, 2023  7:41 PM) prn  albuterol (VENTOLIN HFA) 108 (90 Base) MCG/ACT inhaler [id number]  Inhale 2 puffs into the lungs every 6 (six) hours as needed for wheezing or shortness of breath. Erasmo Downer, MD  Active   apixaban (ELIQUIS) 5 MG TABS tablet [id number] No Take 1 tablet (5 mg total) by mouth 2 (two) times daily. Sunnie Nielsen, DO 06/20/2023 Active Child  cefTRIAXone (ROCEPHIN) IVPB [id number]  Inject 2 g into the vein daily for 27 days. Inject 2 g into the vein daily for 27 days. Indication:  Bacteremia with  prosthetic mitral valve endocarditis First Dose: Yes Last Day of Therapy:  07/22/2023 Labs - Once weekly:  CBC/D, CMP, ESR and CRP Fax weekly lab results  promptly to [phone] Method of administration: IV Push Method of administration may be changed at the discretion of home infusion pharmacist based upon assessment of the patient and/or caregiver's ability to self-administer the medication ordered. Please pull PIC at completion of IV antibiotics Call [phone] with any questions or critical value Sunnie Nielsen, DO  Active   colchicine 0.6 MG tablet [id number]  Take 1 tablet (0.6 mg total) by mouth daily. Sunnie Nielsen, DO  Active   dextromethorphan-guaiFENesin [facility] DM) 30-600 MG 12hr tablet [id number]  Take 1 tablet by mouth 2 (two) times daily as needed for cough. Sunnie Nielsen, DO  Active   empagliflozin (JARDIANCE) 10 MG TABS tablet [id number]  Take 1 tablet (10 mg total) by mouth daily. Sunnie Nielsen, DO  Active   furosemide (LASIX) 40 MG tablet [id number]  Take 1 tablet (40 mg total) by mouth daily. Increase to 1 tablet (40 mg total) by mouth TWICE daily (total daily dose 80 mg) as needed for up to 2 days for increased leg swelling, shortness of breath, weight gain 5+ lbs over 1-2 days. Seek medical care / call your cardiologist if these symptoms are not improving with increased dose. Sunnie Nielsen, DO  Active   hydrocortisone 2.5 % cream [id number] No Apply 1 application topically as needed. [provider] Past Month Active Child           Med Note Sharia Reeve   Fri Jun 20, 2023  7:41 PM) prn  methocarbamol (ROBAXIN) 500 MG tablet [id number] No TAKE 1 TABLET BY MOUTH EVERY 8 HOURS AS NEEDED FOR MUSCLE SPASMS. Erasmo Downer, MD 06/20/2023 Active Child  metoprolol tartrate (LOPRESSOR) 50 MG tablet [id number] No Take 1 tablet (50 mg total) by mouth 2 (two) times daily. Sunnie Nielsen, DO 06/20/2023 Active Child  nitroGLYCERIN (NITROSTAT)  0.4 MG SL tablet [id number]  Place 1 tablet (0.4 mg total) under the tongue every 5 (five) minutes as needed for chest pain. Sunnie Nielsen, DO  Active   ondansetron (ZOFRAN) 4 MG tablet [id number]  Take 1 tablet (4 mg total) by mouth every 6 (six) hours as needed for nausea. Sunnie Nielsen, DO  Active Child           Med Note Sharia Reeve   Fri Jun 20, 2023  7:42 PM) prn  predniSONE (DELTASONE) 10 MG tablet [id number]  Take 2 tablets (20 mg total) by mouth daily with breakfast for 2 days, THEN 1 tablet (10 mg total) daily with breakfast for 2 days, THEN 0.5 tablets (5 mg total) daily with breakfast for 2 days. For gout flare. Sunnie Nielsen, DO  Active   Probiotic, Lactobacillus, CAPS [id number] No Take 1 capsule by mouth in the morning and at bedtime. Sunnie Nielsen, DO 06/19/2023 Active Child  sertraline (ZOLOFT) 50 MG tablet [id number] No TAKE 1 TABLET BY MOUTH EVERY DAY Erasmo Downer, MD 06/20/2023 Active Child  sucralfate (CARAFATE) 1 GM/10ML suspension [id number]  Take 10 mLs (1 g total) by mouth 4 (four) times daily -  with meals and at bedtime. Sunnie Nielsen, DO  Active   traMADol (ULTRAM) 50 MG tablet [id number]  Take 1 tablet (50 mg total) by mouth every 8 (eight) hours as needed for severe pain (pain score 7-10) or moderate pain (pain score 4-6). Sunnie Nielsen, DO  Active             Home Care and Equipment/Supplies: Were Home Health Services Ordered?: Yes Name of Home Health Agency:: [facility] Has Agency set up a time to come to your home?: Yes First Home Health Visit Date: 06/30/23 Any new equipment or medical supplies ordered?: NA  Functional Questionnaire: Do you need assistance with bathing/showering or dressing?: No Do you need assistance with meal preparation?: No Do you need assistance with eating?: No Do you have difficulty maintaining continence: Yes Do you need assistance with getting out of bed/getting out of a chair/moving?: No Do  you have difficulty managing or taking your medications?: No  Follow up appointments reviewed: PCP Follow-up appointment confirmed?: No (The patient does not want to schedule a PCP appointment until she sees her Heart Failure specialist) MD Provider Line [phone] Given: No Specialist Hospital Follow-up appointment confirmed?: Yes Date of Specialist follow-up appointment?: 07/01/23 Follow-Up Specialty Provider:: Clarisa Kindred Do you need transportation to your follow-up appointment?: No Do you understand care options if your condition(s) worsen?: Yes-patient verbalized understanding  SDOH Interventions Today    Flowsheet Row Most Recent Value  SDOH Interventions   Food Insecurity Interventions Intervention Not Indicated  Housing Interventions Intervention Not Indicated  Transportation Interventions Intervention Not Indicated  Utilities Interventions Intervention Not Indicated      Interventions Today    Flowsheet Row Most Recent Value  Chronic Disease   Chronic disease during today's visit Congestive Heart Failure (CHF)  General Interventions   General Interventions Discussed/Reviewed General Interventions Discussed, General Interventions Reviewed, Doctor Visits  Doctor Visits Discussed/Reviewed Doctor Visits Discussed, Doctor Visits Reviewed  Exercise Interventions   Exercise Discussed/Reviewed Physical Activity  Physical Activity Discussed/Reviewed Physical Activity Discussed, Physical Activity Reviewed  Education Interventions   Education Provided Provided Education  Provided Verbal Education On Medication  Pharmacy Interventions   Pharmacy Dicussed/Reviewed Medications and their functions       The patient has been provided with contact information for the care management team and has been advised to call with any health-related questions or concerns. The patient verbalized understanding with current POC. The patient is directed to their insurance card regarding  availability of benefits coverage.  Deidre Ala, BSN, RN [place]  [facility] - [employer] [facility] RN Care Manager [phone]

## 2023-06-30 NOTE — Addendum Note (Signed)
 Addended by: Erasmo Downer on: 06/30/2023 12:57 PM   Modules accepted: Orders

## 2023-06-30 NOTE — Telephone Encounter (Signed)
 Copied from CRM [id number]. Topic: Clinical - Red Word Triage >> Jun 30, 2023 11:41 AM Dondra Prader A wrote: Red Word that prompted transfer to Nurse Triage: Patient called to request a refill for her albuterol (VENTOLIN HFA) 108 (90 Base) MCG/ACT inhaler.  Patient states that she is having Shortness of Breath and has congestive heart failure and the inhaler is helping her to breath. Reason for Disposition  [1] MILD longstanding difficulty breathing AND [2]  SAME as normal  Answer Assessment - Initial Assessment Questions 1. RESPIRATORY STATUS: "Describe your breathing?" (e.g., wheezing, shortness of breath, unable to speak, severe coughing)      SOB 2. ONSET: "When did this breathing problem begin?"      Ongoing has not worsened just needs refill 3. PATTERN "Does the difficult breathing come and go, or has it been constant since it started?"      Comes and goes 4. SEVERITY: "How bad is your breathing?" (e.g., mild, moderate, severe)    - MILD: No SOB at rest, mild SOB with walking, speaks normally in sentences, can lie down, no retractions, pulse < 100.    - MODERATE: SOB at rest, SOB with minimal exertion and prefers to sit, cannot lie down flat, speaks in phrases, mild retractions, audible wheezing, pulse 100-120.    - SEVERE: Very SOB at rest, speaks in single words, struggling to breathe, sitting hunched forward, retractions, pulse > 120      moderate 6. CARDIAC HISTORY: "Do you have any history of heart disease?" (e.g., heart attack, angina, bypass surgery, angioplasty)     A fib/ recent CHF 9. OTHER SYMPTOMS: "Do you have any other symptoms? (e.g., dizziness, runny nose, cough, chest pain, fever)     cough  Protocols used: Breathing Difficulty-A-AH

## 2023-06-30 NOTE — Telephone Encounter (Signed)
 Needing refill of Albuterol inhaler. Pt stated she will call back to make hospital f/u appt after she has heart doc appt tomorrow.

## 2023-07-01 ENCOUNTER — Ambulatory Visit: Payer: Medicare HMO | Attending: Family | Admitting: Family

## 2023-07-01 ENCOUNTER — Encounter: Payer: Self-pay | Admitting: Family

## 2023-07-01 VITALS — BP 126/77 | HR 100 | Wt 224.0 lb

## 2023-07-01 DIAGNOSIS — I38 Endocarditis, valve unspecified: Secondary | ICD-10-CM | POA: Diagnosis not present

## 2023-07-01 DIAGNOSIS — I4892 Unspecified atrial flutter: Secondary | ICD-10-CM | POA: Diagnosis not present

## 2023-07-01 DIAGNOSIS — Z952 Presence of prosthetic heart valve: Secondary | ICD-10-CM | POA: Insufficient documentation

## 2023-07-01 DIAGNOSIS — R519 Headache, unspecified: Secondary | ICD-10-CM | POA: Diagnosis not present

## 2023-07-01 DIAGNOSIS — I2510 Atherosclerotic heart disease of native coronary artery without angina pectoris: Secondary | ICD-10-CM | POA: Insufficient documentation

## 2023-07-01 DIAGNOSIS — J45909 Unspecified asthma, uncomplicated: Secondary | ICD-10-CM | POA: Insufficient documentation

## 2023-07-01 DIAGNOSIS — I495 Sick sinus syndrome: Secondary | ICD-10-CM | POA: Diagnosis not present

## 2023-07-01 DIAGNOSIS — B9689 Other specified bacterial agents as the cause of diseases classified elsewhere: Secondary | ICD-10-CM | POA: Diagnosis not present

## 2023-07-01 DIAGNOSIS — E785 Hyperlipidemia, unspecified: Secondary | ICD-10-CM | POA: Diagnosis not present

## 2023-07-01 DIAGNOSIS — I5033 Acute on chronic diastolic (congestive) heart failure: Secondary | ICD-10-CM | POA: Diagnosis not present

## 2023-07-01 DIAGNOSIS — I480 Paroxysmal atrial fibrillation: Secondary | ICD-10-CM | POA: Diagnosis not present

## 2023-07-01 DIAGNOSIS — N1831 Chronic kidney disease, stage 3a: Secondary | ICD-10-CM | POA: Diagnosis not present

## 2023-07-01 DIAGNOSIS — R058 Other specified cough: Secondary | ICD-10-CM | POA: Diagnosis not present

## 2023-07-01 DIAGNOSIS — N189 Chronic kidney disease, unspecified: Secondary | ICD-10-CM | POA: Diagnosis not present

## 2023-07-01 DIAGNOSIS — G4733 Obstructive sleep apnea (adult) (pediatric): Secondary | ICD-10-CM

## 2023-07-01 DIAGNOSIS — I130 Hypertensive heart and chronic kidney disease with heart failure and stage 1 through stage 4 chronic kidney disease, or unspecified chronic kidney disease: Secondary | ICD-10-CM | POA: Diagnosis not present

## 2023-07-01 DIAGNOSIS — M1990 Unspecified osteoarthritis, unspecified site: Secondary | ICD-10-CM | POA: Diagnosis not present

## 2023-07-01 DIAGNOSIS — R002 Palpitations: Secondary | ICD-10-CM | POA: Insufficient documentation

## 2023-07-01 DIAGNOSIS — Z452 Encounter for adjustment and management of vascular access device: Secondary | ICD-10-CM | POA: Diagnosis not present

## 2023-07-01 DIAGNOSIS — Z792 Long term (current) use of antibiotics: Secondary | ICD-10-CM | POA: Diagnosis not present

## 2023-07-01 DIAGNOSIS — I4819 Other persistent atrial fibrillation: Secondary | ICD-10-CM

## 2023-07-01 DIAGNOSIS — I471 Supraventricular tachycardia, unspecified: Secondary | ICD-10-CM | POA: Diagnosis not present

## 2023-07-01 DIAGNOSIS — F418 Other specified anxiety disorders: Secondary | ICD-10-CM | POA: Diagnosis not present

## 2023-07-01 DIAGNOSIS — I272 Pulmonary hypertension, unspecified: Secondary | ICD-10-CM | POA: Insufficient documentation

## 2023-07-01 DIAGNOSIS — M109 Gout, unspecified: Secondary | ICD-10-CM | POA: Diagnosis not present

## 2023-07-01 DIAGNOSIS — Z8673 Personal history of transient ischemic attack (TIA), and cerebral infarction without residual deficits: Secondary | ICD-10-CM | POA: Insufficient documentation

## 2023-07-01 DIAGNOSIS — I330 Acute and subacute infective endocarditis: Secondary | ICD-10-CM | POA: Diagnosis not present

## 2023-07-01 DIAGNOSIS — Z87891 Personal history of nicotine dependence: Secondary | ICD-10-CM | POA: Diagnosis not present

## 2023-07-01 DIAGNOSIS — R0602 Shortness of breath: Secondary | ICD-10-CM | POA: Diagnosis not present

## 2023-07-01 DIAGNOSIS — M5416 Radiculopathy, lumbar region: Secondary | ICD-10-CM | POA: Diagnosis not present

## 2023-07-01 DIAGNOSIS — I5A Non-ischemic myocardial injury (non-traumatic): Secondary | ICD-10-CM | POA: Diagnosis not present

## 2023-07-01 DIAGNOSIS — I10 Essential (primary) hypertension: Secondary | ICD-10-CM

## 2023-07-01 DIAGNOSIS — Z79899 Other long term (current) drug therapy: Secondary | ICD-10-CM | POA: Insufficient documentation

## 2023-07-01 DIAGNOSIS — Z7984 Long term (current) use of oral hypoglycemic drugs: Secondary | ICD-10-CM | POA: Diagnosis not present

## 2023-07-01 DIAGNOSIS — Z7901 Long term (current) use of anticoagulants: Secondary | ICD-10-CM | POA: Insufficient documentation

## 2023-07-01 DIAGNOSIS — M79671 Pain in right foot: Secondary | ICD-10-CM | POA: Insufficient documentation

## 2023-07-01 DIAGNOSIS — T827XXA Infection and inflammatory reaction due to other cardiac and vascular devices, implants and grafts, initial encounter: Secondary | ICD-10-CM | POA: Diagnosis not present

## 2023-07-01 DIAGNOSIS — I5032 Chronic diastolic (congestive) heart failure: Secondary | ICD-10-CM | POA: Diagnosis not present

## 2023-07-01 DIAGNOSIS — E66812 Obesity, class 2: Secondary | ICD-10-CM | POA: Diagnosis not present

## 2023-07-01 DIAGNOSIS — Z6836 Body mass index (BMI) 36.0-36.9, adult: Secondary | ICD-10-CM | POA: Diagnosis not present

## 2023-07-01 DIAGNOSIS — I5022 Chronic systolic (congestive) heart failure: Secondary | ICD-10-CM | POA: Diagnosis not present

## 2023-07-01 NOTE — Patient Instructions (Signed)
 Medication Changes:  TAKE LASIX TODAY   Lab Work:  Go DOWN to LOWER LEVEL (LL) to have your blood work completed inside of [employer] office.  We will only call you if the results are abnormal or if the provider would like to make medication changes.   Follow-Up in: 1 MONTH AS SCHEDULED   If you have any questions or concerns before your next appointment please send [place] a message through mychart or call our office at [phone] Monday-Friday 8 am-5 pm.   If you have an urgent need after hours on the weekend please call your Primary Cardiologist or the [facility] in [place] at [phone].   At the [facility], you and your health needs are our priority. We have a designated team specialized in the treatment of Heart Failure. This Care Team includes your primary Heart Failure Specialized Cardiologist (physician), Advanced Practice Providers (APPs- Physician Assistants and Nurse Practitioners), and Pharmacist who all work together to provide you with the care you need, when you need it.   You may see any of the following providers on your designated Care Team at your next follow up:  Dr. Arvilla Meres Dr. Marca Ancona Dr. Dorthula Nettles Dr. Theresia Bough Tonye Becket, NP Robbie Lis, [place] [address] [place], [place] Brynda Peon, NP [place] Lee, NP Clarisa Kindred, NP Enos Fling, PharmD

## 2023-07-02 ENCOUNTER — Telehealth: Payer: Self-pay

## 2023-07-02 ENCOUNTER — Encounter: Payer: Self-pay | Admitting: Family

## 2023-07-02 ENCOUNTER — Ambulatory Visit: Payer: Self-pay | Admitting: Family Medicine

## 2023-07-02 DIAGNOSIS — N1831 Chronic kidney disease, stage 3a: Secondary | ICD-10-CM | POA: Diagnosis not present

## 2023-07-02 DIAGNOSIS — E66812 Obesity, class 2: Secondary | ICD-10-CM | POA: Diagnosis not present

## 2023-07-02 DIAGNOSIS — F418 Other specified anxiety disorders: Secondary | ICD-10-CM | POA: Diagnosis not present

## 2023-07-02 DIAGNOSIS — I10 Essential (primary) hypertension: Secondary | ICD-10-CM | POA: Diagnosis not present

## 2023-07-02 DIAGNOSIS — I495 Sick sinus syndrome: Secondary | ICD-10-CM | POA: Diagnosis not present

## 2023-07-02 DIAGNOSIS — B9689 Other specified bacterial agents as the cause of diseases classified elsewhere: Secondary | ICD-10-CM | POA: Diagnosis not present

## 2023-07-02 DIAGNOSIS — I130 Hypertensive heart and chronic kidney disease with heart failure and stage 1 through stage 4 chronic kidney disease, or unspecified chronic kidney disease: Secondary | ICD-10-CM | POA: Diagnosis not present

## 2023-07-02 DIAGNOSIS — I480 Paroxysmal atrial fibrillation: Secondary | ICD-10-CM | POA: Diagnosis not present

## 2023-07-02 DIAGNOSIS — Z8673 Personal history of transient ischemic attack (TIA), and cerebral infarction without residual deficits: Secondary | ICD-10-CM | POA: Diagnosis not present

## 2023-07-02 DIAGNOSIS — Z87891 Personal history of nicotine dependence: Secondary | ICD-10-CM | POA: Diagnosis not present

## 2023-07-02 DIAGNOSIS — E785 Hyperlipidemia, unspecified: Secondary | ICD-10-CM | POA: Diagnosis not present

## 2023-07-02 DIAGNOSIS — I5033 Acute on chronic diastolic (congestive) heart failure: Secondary | ICD-10-CM | POA: Diagnosis not present

## 2023-07-02 DIAGNOSIS — G4733 Obstructive sleep apnea (adult) (pediatric): Secondary | ICD-10-CM | POA: Diagnosis not present

## 2023-07-02 DIAGNOSIS — I5A Non-ischemic myocardial injury (non-traumatic): Secondary | ICD-10-CM | POA: Diagnosis not present

## 2023-07-02 DIAGNOSIS — J45909 Unspecified asthma, uncomplicated: Secondary | ICD-10-CM | POA: Diagnosis not present

## 2023-07-02 DIAGNOSIS — I5022 Chronic systolic (congestive) heart failure: Secondary | ICD-10-CM | POA: Diagnosis not present

## 2023-07-02 DIAGNOSIS — Z452 Encounter for adjustment and management of vascular access device: Secondary | ICD-10-CM | POA: Diagnosis not present

## 2023-07-02 DIAGNOSIS — Z792 Long term (current) use of antibiotics: Secondary | ICD-10-CM | POA: Diagnosis not present

## 2023-07-02 DIAGNOSIS — Z7901 Long term (current) use of anticoagulants: Secondary | ICD-10-CM | POA: Diagnosis not present

## 2023-07-02 DIAGNOSIS — I471 Supraventricular tachycardia, unspecified: Secondary | ICD-10-CM | POA: Diagnosis not present

## 2023-07-02 DIAGNOSIS — M5416 Radiculopathy, lumbar region: Secondary | ICD-10-CM | POA: Diagnosis not present

## 2023-07-02 DIAGNOSIS — T827XXA Infection and inflammatory reaction due to other cardiac and vascular devices, implants and grafts, initial encounter: Secondary | ICD-10-CM | POA: Diagnosis not present

## 2023-07-02 DIAGNOSIS — Z6836 Body mass index (BMI) 36.0-36.9, adult: Secondary | ICD-10-CM | POA: Diagnosis not present

## 2023-07-02 DIAGNOSIS — M1990 Unspecified osteoarthritis, unspecified site: Secondary | ICD-10-CM | POA: Diagnosis not present

## 2023-07-02 DIAGNOSIS — I330 Acute and subacute infective endocarditis: Secondary | ICD-10-CM | POA: Diagnosis not present

## 2023-07-02 LAB — BRAIN NATRIURETIC PEPTIDE: BNP: 578.6 pg/mL — ABNORMAL HIGH (ref 0.0–100.0)

## 2023-07-02 LAB — BASIC METABOLIC PANEL
BUN/Creatinine Ratio: 22 (ref 12–28)
BUN: 29 mg/dL — ABNORMAL HIGH (ref 8–27)
CO2: 22 mmol/L (ref 20–29)
Calcium: 8.2 mg/dL — ABNORMAL LOW (ref 8.7–10.3)
Chloride: 100 mmol/L (ref 96–106)
Creatinine, Ser: 1.3 mg/dL — ABNORMAL HIGH (ref 0.57–1.00)
Glucose: 144 mg/dL — ABNORMAL HIGH (ref 70–99)
Potassium: 3.7 mmol/L (ref 3.5–5.2)
Sodium: 139 mmol/L (ref 134–144)
eGFR: 44 mL/min/{1.73_m2} — ABNORMAL LOW (ref >59)

## 2023-07-02 LAB — CBC
Hematocrit: 30.6 % — ABNORMAL LOW (ref 34.0–46.6)
Hemoglobin: 9 g/dL — ABNORMAL LOW (ref 11.1–15.9)
MCH: 25.2 pg — ABNORMAL LOW (ref 26.6–33.0)
MCHC: 29.4 g/dL — ABNORMAL LOW (ref 31.5–35.7)
MCV: 86 fL (ref 79–97)
Platelets: 121 10*3/uL — ABNORMAL LOW (ref 150–450)
RBC: 3.57 x10E6/uL — ABNORMAL LOW (ref 3.77–5.28)
RDW: 15.8 % — ABNORMAL HIGH (ref 11.7–15.4)
WBC: 10.1 10*3/uL (ref 3.4–10.8)

## 2023-07-02 NOTE — Telephone Encounter (Unsigned)
 Copied from CRM [id number]. Topic: Clinical - Home Health Verbal Orders >> Jul 02, 2023  9:23 AM Ward Chatters wrote: Caller/Agency: Judeth Cornfield from [facility] Callback Number: [phone] Service Requested: Occupational Therapy Frequency: 1x6w Any new concerns about the patient? No  When calling please leave full name with credentials

## 2023-07-02 NOTE — Telephone Encounter (Signed)
  Chief Complaint: right foot and leg pain from possible gout per patient , requesting medication for pain Symptoms: right foot and leg pain , can hardly walk but using walker. Reports recent hospitalization and issues with taking too much lasix. Will take antibiotics via PICC line until March 18.  Frequency: 2 days ago  Pertinent Negatives: Patient denies chest pain no difficulty breathing no fever no swelling in legs reported Disposition: [] ED /[] Urgent Care (no appt availability in office) / [x] Appointment(In office/virtual)/ []  [place] [facility]/ [] Home Care/ [] Refused Recommended Disposition /[] [place] [facility]/ []  Follow-up with PCP Additional Notes:   Recommended appt today or VV today . Patient unable to make appt today. Scheduled appt same day at [place] for tomorrow. Please advise if medication can be given for gout pain . Patient has colchicine on medication list but patient reports tylenol not helping . Recommended if sx worsen go to UC or ED.  Please advise         Copied from CRM [id number]. Topic: Clinical - Medication Question >> Jul 02, 2023 10:52 AM Gery Pray wrote: Reason for CRM: Patient states that the medication that she took has caused to get the gout in both her feet. Patient states that her pain is at least a 9. Patient would like to know if there is an alternate medication furosemide (LASIX) 40 MG tablet. Reason for Disposition  [1] SEVERE pain (e.g., excruciating, unable to do any normal activities) AND [2] not improved after 2 hours of pain medicine  Answer Assessment - Initial Assessment Questions 1. ONSET: "When did the pain start?"      Right foot pain with walking 2 days ago 2. LOCATION: "Where is the pain located?"      Right foot  3. PAIN: "How bad is the pain?"    (Scale 1-10; or mild, moderate, severe)   -  MILD (1-3): doesn't interfere with normal activities    -  MODERATE (4-7): interferes with normal activities (e.g., work or school) or awakens  from sleep, limping    -  SEVERE (8-10): excruciating pain, unable to do any normal activities, unable to walk     Severe 9 . Can walk with walker but painful 4. WORK OR EXERCISE: "Has there been any recent work or exercise that involved this part of the body?"      Na  5. CAUSE: "What do you think is causing the leg pain?"     Possible lasix dose too hight 6. OTHER SYMPTOMS: "Do you have any other symptoms?" (e.g., chest pain, back pain, breathing difficulty, swelling, rash, fever, numbness, weakness)     Right foot and leg. Told it was gout while in hospital. Headache from sinus issues . 7. PREGNANCY: "Is there any chance you are pregnant?" "When was your last menstrual period?"     Na  Protocols used: Leg Pain-A-AH

## 2023-07-03 ENCOUNTER — Other Ambulatory Visit: Payer: Self-pay | Admitting: Family

## 2023-07-03 ENCOUNTER — Inpatient Hospital Stay: Payer: Medicare HMO | Admitting: Nurse Practitioner

## 2023-07-03 ENCOUNTER — Other Ambulatory Visit: Payer: Self-pay

## 2023-07-03 DIAGNOSIS — I5032 Chronic diastolic (congestive) heart failure: Secondary | ICD-10-CM

## 2023-07-03 MED ORDER — SPIRONOLACTONE 25 MG PO TABS: 12.5000 mg | ORAL_TABLET | Freq: Every day | ORAL | 3 refills | Status: AC

## 2023-07-03 NOTE — Telephone Encounter (Signed)
 OK for verbals

## 2023-07-03 NOTE — Telephone Encounter (Signed)
Verbals given  

## 2023-07-03 NOTE — Telephone Encounter (Signed)
 Agree with being seen in person and evaluated.

## 2023-07-04 DIAGNOSIS — T826XXA Infection and inflammatory reaction due to cardiac valve prosthesis, initial encounter: Secondary | ICD-10-CM | POA: Diagnosis not present

## 2023-07-07 ENCOUNTER — Ambulatory Visit: Payer: Medicare HMO | Attending: Medical | Admitting: Medical

## 2023-07-07 ENCOUNTER — Ambulatory Visit: Payer: Self-pay | Admitting: Family Medicine

## 2023-07-07 DIAGNOSIS — I480 Paroxysmal atrial fibrillation: Secondary | ICD-10-CM | POA: Diagnosis not present

## 2023-07-07 DIAGNOSIS — N1831 Chronic kidney disease, stage 3a: Secondary | ICD-10-CM | POA: Diagnosis not present

## 2023-07-07 DIAGNOSIS — T827XXA Infection and inflammatory reaction due to other cardiac and vascular devices, implants and grafts, initial encounter: Secondary | ICD-10-CM | POA: Diagnosis not present

## 2023-07-07 DIAGNOSIS — E66812 Obesity, class 2: Secondary | ICD-10-CM | POA: Diagnosis not present

## 2023-07-07 DIAGNOSIS — Z792 Long term (current) use of antibiotics: Secondary | ICD-10-CM | POA: Diagnosis not present

## 2023-07-07 DIAGNOSIS — Z6836 Body mass index (BMI) 36.0-36.9, adult: Secondary | ICD-10-CM | POA: Diagnosis not present

## 2023-07-07 DIAGNOSIS — E785 Hyperlipidemia, unspecified: Secondary | ICD-10-CM | POA: Diagnosis not present

## 2023-07-07 DIAGNOSIS — Z7689 Persons encountering health services in other specified circumstances: Secondary | ICD-10-CM | POA: Diagnosis not present

## 2023-07-07 DIAGNOSIS — I330 Acute and subacute infective endocarditis: Secondary | ICD-10-CM | POA: Diagnosis not present

## 2023-07-07 DIAGNOSIS — Z7901 Long term (current) use of anticoagulants: Secondary | ICD-10-CM | POA: Diagnosis not present

## 2023-07-07 DIAGNOSIS — I5033 Acute on chronic diastolic (congestive) heart failure: Secondary | ICD-10-CM | POA: Diagnosis not present

## 2023-07-07 DIAGNOSIS — F418 Other specified anxiety disorders: Secondary | ICD-10-CM | POA: Diagnosis not present

## 2023-07-07 DIAGNOSIS — M5416 Radiculopathy, lumbar region: Secondary | ICD-10-CM | POA: Diagnosis not present

## 2023-07-07 DIAGNOSIS — B9689 Other specified bacterial agents as the cause of diseases classified elsewhere: Secondary | ICD-10-CM | POA: Diagnosis not present

## 2023-07-07 DIAGNOSIS — I5022 Chronic systolic (congestive) heart failure: Secondary | ICD-10-CM | POA: Diagnosis not present

## 2023-07-07 DIAGNOSIS — I10 Essential (primary) hypertension: Secondary | ICD-10-CM | POA: Diagnosis not present

## 2023-07-07 DIAGNOSIS — J45909 Unspecified asthma, uncomplicated: Secondary | ICD-10-CM | POA: Diagnosis not present

## 2023-07-07 DIAGNOSIS — Z8673 Personal history of transient ischemic attack (TIA), and cerebral infarction without residual deficits: Secondary | ICD-10-CM | POA: Diagnosis not present

## 2023-07-07 DIAGNOSIS — I495 Sick sinus syndrome: Secondary | ICD-10-CM | POA: Diagnosis not present

## 2023-07-07 DIAGNOSIS — Z87891 Personal history of nicotine dependence: Secondary | ICD-10-CM | POA: Diagnosis not present

## 2023-07-07 DIAGNOSIS — I130 Hypertensive heart and chronic kidney disease with heart failure and stage 1 through stage 4 chronic kidney disease, or unspecified chronic kidney disease: Secondary | ICD-10-CM | POA: Diagnosis not present

## 2023-07-07 DIAGNOSIS — I471 Supraventricular tachycardia, unspecified: Secondary | ICD-10-CM | POA: Diagnosis not present

## 2023-07-07 DIAGNOSIS — G4733 Obstructive sleep apnea (adult) (pediatric): Secondary | ICD-10-CM | POA: Diagnosis not present

## 2023-07-07 DIAGNOSIS — M1990 Unspecified osteoarthritis, unspecified site: Secondary | ICD-10-CM | POA: Diagnosis not present

## 2023-07-07 DIAGNOSIS — I5A Non-ischemic myocardial injury (non-traumatic): Secondary | ICD-10-CM | POA: Diagnosis not present

## 2023-07-07 DIAGNOSIS — Z452 Encounter for adjustment and management of vascular access device: Secondary | ICD-10-CM | POA: Diagnosis not present

## 2023-07-07 NOTE — Telephone Encounter (Signed)
 This says vomiting blood!  She needs to go to ED for vomiting blood

## 2023-07-07 NOTE — Telephone Encounter (Signed)
 Call received by Tina Bastos, RN [facility] nurse today not with patient now. Sx noted approx 1130 am today  Chief Complaint: bilateral legs/ ankles swelling, purplish rash/ discoloration at ankles and legs. Vomiting blood reported by patient.  Symptoms: bilateral legs swelling ankle more swollen than legs. Right lower leg/ ankle area noted with purple discoloration / rash . No itching no burning, tender to touch. Vomiting blood couple of days per RN but not today .  Frequency: today  Pertinent Negatives: Patient denies chest pain no difficulty breathing .  Disposition: [] ED /[] Urgent Care (no appt availability in office) / [] Appointment(In office/virtual)/ []  [place] [facility]/ [] Home Care/ [] Refused Recommended Disposition /[] [place] [facility]/ [x]  Follow-up with PCP Additional Notes:   HH RN reports patient said she took a nitroglycerin tablet today due to "nerves" because patient's mother died this am, but denied chest pain and no SOB. NT recommended to [facility] RN to call office when she is with patient in the future with multiple medical issues in case UC/ED recommended. NT attempted to contact patient and no answer, left message to call office #[phone]. Appt already scheduled for tomorrow. Please advise.        Copied from CRM [id number]. Topic: Clinical - Red Word Triage >> Jul 07, 2023  3:58 PM Tina Peters wrote: Red Word that prompted transfer to Nurse Triage: Tina Peters from [facility] is calling in on the behalf of the patient. Patient has a purple rash on the right lower leg, both legs are swollen, and throwing up blood. Reason for Disposition  [1] Thigh, calf, or ankle swelling AND [2] bilateral AND [3] 1 side is more swollen  Answer Assessment - Initial Assessment Questions 1. ONSET: "When did the swelling start?" (e.g., minutes, hours, days)     Noted this am per [facility] nurse Anna 2. LOCATION: "What part of the leg is swollen?"  "Are both legs swollen or just one leg?"     Mostly  ankles 3. SEVERITY: "How bad is the swelling?" (e.g., localized; mild, moderate, severe)   - Localized: Small area of swelling localized to one leg.   - MILD pedal edema: Swelling limited to foot and ankle, pitting edema < 1/4 inch (6 mm) deep, rest and elevation eliminate most or all swelling.   - MODERATE edema: Swelling of lower leg to knee, pitting edema > 1/4 inch (6 mm) deep, rest and elevation only partially reduce swelling.   - SEVERE edema: Swelling extends above knee, facial or hand swelling present.      Bilateral ankle swelling, right leg and ankle worse than left . 4. REDNESS: "Does the swelling look red or infected?"     Denies but ankles noted with purple discoloration / rash  5. PAIN: "Is the swelling painful to touch?" If Yes, ask: "How painful is it?"   (Scale 1-10; mild, moderate or severe)     Tender to touch 6. FEVER: "Do you have a fever?" If Yes, ask: "What is it, how was it measured, and when did it start?"      na 7. CAUSE: "What do you think is causing the leg swelling?"     Not sure  8. MEDICAL HISTORY: "Do you have a history of blood clots (e.g., DVT), cancer, heart failure, kidney disease, or liver failure?"     See hx  9. RECURRENT SYMPTOM: "Have you had leg swelling before?" If Yes, ask: "When was the last time?" "What happened that time?"     unknown  10. OTHER SYMPTOMS: "Do you have any other symptoms?" (e.g., chest pain, difficulty breathing)       Bilateral legs/ ankles swollen, purple rash to legs/ ankles. Vomiting blood couple of days. RN reports patient took "nitroglycerin" this am for "nerves" due to patient mother dying this am  44. PREGNANCY: "Is there any chance you are pregnant?" "When was your last menstrual period?"       na  Protocols used: Leg Swelling and Edema-A-AH

## 2023-07-07 NOTE — Telephone Encounter (Signed)
 Spoke with patient daughter and she stated that she is not vomiting blood just coughing up mucous that has a burgundy color to it. Main concern is leg swelling. EMT came and checked her out. Patient has appoint scheduled for 9am tomorrow 07/08/23.

## 2023-07-07 NOTE — Progress Notes (Deleted)
  Cardiology Office Note:  .   Date:  07/07/2023  ID:  Tina Peters, DOB 1950/07/19, MRN [id number] PCP: Erasmo Downer, MD  [facility] Providers Cardiologist:  Lorine Bears, MD Electrophysiologist:  Lanier Prude, MD { Click to update primary MD,subspecialty MD or APP then REFRESH:1}   History of Present Illness: .   Tina Peters is a 73 y.o. female with a h/o paroxysmal Afib, tachybradycardia syndrome, pSVT, HTN, HLD, stroke, OSA on CPAP, CKD 3, endocarditis s/p MVR, asthma, toabcco abuse, back pain, and diverticulitis who is being seen for hospital follow-up.     ROS: ***  Studies Reviewed: .        *** Risk Assessment/Calculations:   {Does this patient have ATRIAL FIBRILLATION?:[id number]} No BP recorded.  {Refresh Note OR Click here to enter BP  :1}***       Physical Exam:   VS:  There were no vitals taken for this visit.   Wt Readings from Last 3 Encounters:  07/01/23 224 lb (101.6 kg)  06/27/23 228 lb 13.4 oz (103.8 kg)  06/11/23 224 lb 13.9 oz (102 kg)    GEN: Well nourished, well developed in no acute distress NECK: No JVD; No carotid bruits CARDIAC: ***RRR, no murmurs, rubs, gallops RESPIRATORY:  Clear to auscultation without rales, wheezing or rhonchi  ABDOMEN: Soft, non-tender, non-distended EXTREMITIES:  No edema; No deformity   ASSESSMENT AND PLAN: .   ***    {Are you ordering a CV Procedure (e.g. stress test, cath, DCCV, TEE, etc)?   Press F2        :[id number]}  Dispo: ***  Signed, Nelva Hauk David Stall, PA-C

## 2023-07-08 ENCOUNTER — Ambulatory Visit: Payer: Medicare HMO | Admitting: Family Medicine

## 2023-07-08 ENCOUNTER — Encounter: Payer: Self-pay | Admitting: Family Medicine

## 2023-07-08 ENCOUNTER — Ambulatory Visit
Admission: RE | Admit: 2023-07-08 | Discharge: 2023-07-08 | Disposition: A | Discharge status: Home / Self Care | Attending: Family Medicine | Admitting: Family Medicine

## 2023-07-08 ENCOUNTER — Ambulatory Visit
Admission: RE | Admit: 2023-07-08 | Discharge: 2023-07-08 | Disposition: A | Discharge status: Home / Self Care | Source: Ambulatory Visit | Attending: Family Medicine

## 2023-07-08 VITALS — BP 111/69 | HR 96 | Temp 98.3°F | Ht 65.0 in | Wt 234.0 lb

## 2023-07-08 DIAGNOSIS — J90 Pleural effusion, not elsewhere classified: Secondary | ICD-10-CM

## 2023-07-08 DIAGNOSIS — R233 Spontaneous ecchymoses: Secondary | ICD-10-CM

## 2023-07-08 DIAGNOSIS — I5033 Acute on chronic diastolic (congestive) heart failure: Secondary | ICD-10-CM

## 2023-07-08 DIAGNOSIS — F4322 Adjustment disorder with anxiety: Secondary | ICD-10-CM

## 2023-07-08 DIAGNOSIS — N1832 Chronic kidney disease, stage 3b: Secondary | ICD-10-CM | POA: Diagnosis not present

## 2023-07-08 DIAGNOSIS — D509 Iron deficiency anemia, unspecified: Secondary | ICD-10-CM | POA: Diagnosis not present

## 2023-07-08 DIAGNOSIS — I480 Paroxysmal atrial fibrillation: Secondary | ICD-10-CM | POA: Diagnosis not present

## 2023-07-08 DIAGNOSIS — Z452 Encounter for adjustment and management of vascular access device: Secondary | ICD-10-CM | POA: Diagnosis not present

## 2023-07-08 DIAGNOSIS — I330 Acute and subacute infective endocarditis: Secondary | ICD-10-CM

## 2023-07-08 DIAGNOSIS — R7989 Other specified abnormal findings of blood chemistry: Secondary | ICD-10-CM

## 2023-07-08 DIAGNOSIS — D696 Thrombocytopenia, unspecified: Secondary | ICD-10-CM | POA: Diagnosis not present

## 2023-07-08 DIAGNOSIS — F411 Generalized anxiety disorder: Secondary | ICD-10-CM

## 2023-07-08 MED ORDER — GABAPENTIN 100 MG PO CAPS: 100.0000 mg | ORAL_CAPSULE | Freq: Every day | ORAL | 3 refills | Status: DC

## 2023-07-08 MED ORDER — HYDROXYZINE HCL 10 MG PO TABS: 10.0000 mg | ORAL_TABLET | Freq: Three times a day (TID) | ORAL | 0 refills | Status: DC | PRN

## 2023-07-08 MED ORDER — SERTRALINE HCL 100 MG PO TABS: 100.0000 mg | ORAL_TABLET | Freq: Every day | ORAL | 1 refills | Status: DC

## 2023-07-08 NOTE — Progress Notes (Signed)
 Established patient visit   Patient: Tina Peters   DOB: [birth year]   73 y.o. Female  MRN: [id number] Visit Date: 07/08/2023  Today's healthcare provider: Shirlee Latch, MD   Chief Complaint  Patient presents with   Hospitalization Follow-up    Patient admitted to [facility] 06/20/23-06/27/23 for Acute on chronic diastolic CHF. Pt reports she is unable to sleep, nervous, sick, SOB, rash and anxiety. She would like to get better and know what is going on. She has also reported losing her mother yesterday    Leg Swelling    Bilateral leg swelling noticed in ankles more than legs. Daughter reports swelling one and off and patient is taking her lasix.    Rash    Located on ankles and getting worse, no tiching or burning to report. First noticed Saturday    Anxiety   Subjective    HPI HPI     Hospitalization Follow-up    Additional comments: Patient admitted to [facility] 06/20/23-06/27/23 for Acute on chronic diastolic CHF. Pt reports she is unable to sleep, nervous, sick, SOB, rash and anxiety. She would like to get better and know what is going on. She has also reported losing her mother yesterday         Leg Swelling    Additional comments: Bilateral leg swelling noticed in ankles more than legs. Daughter reports swelling one and off and patient is taking her lasix.         Rash    Additional comments: Located on ankles and getting worse, no tiching or burning to report. First noticed Saturday       Last edited by Acey Lav, CMA on 07/08/2023  9:17 AM.       Discussed the use of AI scribe software for clinical note transcription with the patient, who gave verbal consent to proceed.  History of Present Illness   The patient, with a complex medical history including endocarditis, heart failure, and gout, presents with multiple concerns. She was recently hospitalized due to abdominal pain and shortness of breath, which was found to be due to endocarditis and heart  failure exacerbation. She is currently on a six-week course of Rosadan for endocarditis and has a PICC line in place. She reports leg swelling, a petechial rash, and coughing with some blood-tinged mucus. The patient's caregiver mentions that the patient has been experiencing increased anxiety and sleep disturbances. The patient also reports leg pain, which was previously managed with gabapentin, but this medication was discontinued during her recent hospital stay. The patient's caregiver is concerned about the patient's increased anxiety and sleep disturbances, and mentions that the patient has been experiencing increased leg pain.            07/08/2023    9:29 AM 01/27/2023    1:15 PM 08/15/2022    2:36 PM 04/09/2022    4:44 PM 01/22/2022    4:06 PM  Depression screen PHQ 2/9  Decreased Interest 3 1 0 0 0  Down, Depressed, Hopeless 3 1 1  0 0  PHQ - 2 Score 6 2 1  0 0  Altered sleeping 3 1 2 3  0  Tired, decreased energy 3 1 1  0 0  Change in appetite 3 2 0 0 0  Feeling bad or failure about yourself  0 0 0 0 0  Trouble concentrating 0 0 0 0 0  Moving slowly or fidgety/restless 2 0 0 0 0  Suicidal thoughts 0 0 0 0 0  PHQ-9 Score 17 6 4 3  0  Difficult doing work/chores Extremely dIfficult Not difficult at all Not difficult at all Not difficult at all Not difficult at all       07/08/2023    9:29 AM 01/08/2022    4:15 PM  GAD 7 : Generalized Anxiety Score  Nervous, Anxious, on Edge 3 2  Control/stop worrying 3 0  Worry too much - different things 3 0  Trouble relaxing 3 0  Restless 3 0  Easily annoyed or irritable 3 0  Afraid - awful might happen 3 0  Total GAD 7 Score 21 2  Anxiety Difficulty Extremely difficult Not difficult at all      Medications: Outpatient Medications Prior to Visit  Medication Sig   acetaminophen (TYLENOL) 325 MG tablet Take by mouth. 4 tablets as needed   albuterol (VENTOLIN HFA) 108 (90 Base) MCG/ACT inhaler Inhale 2 puffs into the lungs every 6 (six)  hours as needed for wheezing or shortness of breath.   apixaban (ELIQUIS) 5 MG TABS tablet Take 1 tablet (5 mg total) by mouth 2 (two) times daily.   cefTRIAXone (ROCEPHIN) IVPB Inject 2 g into the vein daily for 27 days. Inject 2 g into the vein daily for 27 days. Indication:  Bacteremia with prosthetic mitral valve endocarditis First Dose: Yes Last Day of Therapy:  07/22/2023 Labs - Once weekly:  CBC/D, CMP, ESR and CRP Fax weekly lab results  promptly to [id number] Method of administration: IV Push Method of administration may be changed at the discretion of home infusion pharmacist based upon assessment of the patient and/or caregiver's ability to self-administer the medication ordered. Please pull PIC at completion of IV antibiotics Call [phone] with any questions or critical value   colchicine 0.6 MG tablet Take 1 tablet (0.6 mg total) by mouth daily.   dextromethorphan-guaiFENesin (MUCINEX DM) 30-600 MG 12hr tablet Take 1 tablet by mouth 2 (two) times daily as needed for cough.   empagliflozin (JARDIANCE) 10 MG TABS tablet Take 1 tablet (10 mg total) by mouth daily.   furosemide (LASIX) 40 MG tablet Take 1 tablet (40 mg total) by mouth daily. Increase to 1 tablet (40 mg total) by mouth TWICE daily (total daily dose 80 mg) as needed for up to 2 days for increased leg swelling, shortness of breath, weight gain 5+ lbs over 1-2 days. Seek medical care / call your cardiologist if these symptoms are not improving with increased dose. (Patient taking differently: Take 40 mg by mouth daily. As needed)   hydrocortisone 2.5 % cream Apply 1 application topically as needed.   methocarbamol (ROBAXIN) 500 MG tablet TAKE 1 TABLET BY MOUTH EVERY 8 HOURS AS NEEDED FOR MUSCLE SPASMS.   metoprolol tartrate (LOPRESSOR) 50 MG tablet Take 1 tablet (50 mg total) by mouth 2 (two) times daily.   nitroGLYCERIN (NITROSTAT) 0.4 MG SL tablet Place 1 tablet (0.4 mg total) under the tongue every 5 (five) minutes  as needed for chest pain.   ondansetron (ZOFRAN) 4 MG tablet Take 1 tablet (4 mg total) by mouth every 6 (six) hours as needed for nausea.   Probiotic, Lactobacillus, CAPS Take 1 capsule by mouth in the morning and at bedtime.   spironolactone (ALDACTONE) 25 MG tablet Take 0.5 tablets (12.5 mg total) by mouth daily.   sucralfate (CARAFATE) 1 GM/10ML suspension Take 10 mLs (1 g total) by mouth 4 (four) times daily -  with meals and at bedtime.   traMADol (ULTRAM) 50  MG tablet Take 1 tablet (50 mg total) by mouth every 8 (eight) hours as needed for severe pain (pain score 7-10) or moderate pain (pain score 4-6).   [DISCONTINUED] sertraline (ZOLOFT) 50 MG tablet TAKE 1 TABLET BY MOUTH EVERY DAY   No facility-administered medications prior to visit.    Review of Systems       Objective    BP 111/69 (BP Location: Left Arm, Patient Position: Sitting, Cuff Size: Large)   Pulse 96   Temp 98.3 F (36.8 C) (Oral)   Ht 5\' 5"  (1.651 m)   Wt 234 lb (106.1 kg)   SpO2 98%   BMI 38.94 kg/m    Physical Exam Cardiovascular:     Rate and Rhythm: Normal rate and regular rhythm.     Heart sounds: Murmur heard.  Pulmonary:     Effort: Pulmonary effort is normal.     Breath sounds: Rales present.  Skin:    Findings: Rash present.      Physical Exam   CHEST: Crackles present in lungs. EXTREMITIES: Pitting edema present in extremities. SKIN: Petechial rash present, related to endocarditis.              No results found for any visits on 07/08/23.  Assessment & Plan     Problem List Items Addressed This Visit       Cardiovascular and Mediastinum   PAF (paroxysmal atrial fibrillation) (HCC)   Acute bacterial endocarditis   Acute on chronic diastolic CHF (congestive heart failure) (HCC) - Primary     Respiratory   Pleural effusion   Relevant Orders   DG Chest 2 View     Genitourinary   Chronic renal failure (CRF), stage 3b (HCC)     Hematopoietic and Hemostatic    Thrombocytopenia (HCC)     Other   GAD (generalized anxiety disorder)   Relevant Medications   hydrOXYzine (ATARAX) 10 MG tablet   sertraline (ZOLOFT) 100 MG tablet   Abnormal LFTs   Iron deficiency anemia   Other Visit Diagnoses       Petechial eruption         Adjustment disorder with anxious mood               Endocarditis Recent hospitalization from February 14th to 21st for endocarditis leading to sepsis. Discharged on a six-week course of Rocephin via PICC line. Developed a petechial rash, non-blanching, consistent with endocarditis. Wet cough likely due to fluid overload, not an allergic reaction to Rocephin. Rocephin chosen due to bacterial sensitivity, confirmed by infectious disease and pharmacists. - Continue Rocephin for six weeks - Notify infectious disease about petechial rash - Order chest x-ray to evaluate for pleural effusion - Recheck potassium and blood counts today - Elevate legs to reduce swelling  Heart Failure Exacerbation Exacerbation due to fluid shifts from recent hospitalization. Lung crackles indicate fluid overload. Not yet started on spironolactone, recommended by heart failure clinic. Discussed balancing Lasix and spironolactone to manage fluid and preserve potassium. Recent maternal death contributing to stress and potential non-compliance. - Start spironolactone per cardiology recommendations - Continue Lasix - Monitor fluid levels and symptoms - Follow up with cardiologist on March 25th  Anxiety Increased anxiety due to recent maternal death and ongoing health issues. Currently on Zoloft 50 mg, which may be insufficient. Discussed that Zoloft treats underlying anxiety and does not cause insomnia. Considered short-term hydroxyzine for acute anxiety and sleep aid. - Increase Zoloft to 100 mg - Prescribe hydroxyzine for acute  anxiety and sleep aid - Follow up in six weeks to reassess anxiety and medication efficacy  Chronic leg pain Current leg  pain, previously managed with gabapentin, not included in recent medication list from the hospital. Discussed restarting gabapentin for pain management. - Restart gabapentin at bedtime - Monitor for pain relief and adjust dosage if necessary  General Health Maintenance None - Follow up with infectious disease on March 13th - Follow up with cardiologist on March 25th - Schedule a six-week follow-up with primary care to reassess anxiety and medication efficacy.       Return in about 6 weeks (around 08/19/2023) for MDD/GAD f/u.       Shirlee Latch, MD  [facility] [phone] (phone) [phone] (fax)  [facility]

## 2023-07-09 ENCOUNTER — Encounter: Payer: Self-pay | Admitting: Family Medicine

## 2023-07-09 ENCOUNTER — Telehealth: Payer: Self-pay

## 2023-07-09 NOTE — Telephone Encounter (Signed)
 Copied from CRM [id number]. Topic: General - Other >> Jul 09, 2023 12:38 PM [place] F wrote: Reason for CRM: Patient's daughter is calling in because she would like some follow up information from patient's appointment yesterday. She is requesting a call back from Dr. Leonard Schwartz or her nurse today.

## 2023-07-10 ENCOUNTER — Telehealth: Payer: Self-pay

## 2023-07-10 DIAGNOSIS — R21 Rash and other nonspecific skin eruption: Secondary | ICD-10-CM | POA: Diagnosis not present

## 2023-07-10 DIAGNOSIS — I330 Acute and subacute infective endocarditis: Secondary | ICD-10-CM | POA: Diagnosis not present

## 2023-07-10 DIAGNOSIS — F419 Anxiety disorder, unspecified: Secondary | ICD-10-CM | POA: Diagnosis not present

## 2023-07-10 DIAGNOSIS — I4891 Unspecified atrial fibrillation: Secondary | ICD-10-CM | POA: Diagnosis not present

## 2023-07-10 DIAGNOSIS — J90 Pleural effusion, not elsewhere classified: Secondary | ICD-10-CM | POA: Diagnosis not present

## 2023-07-10 DIAGNOSIS — I5033 Acute on chronic diastolic (congestive) heart failure: Secondary | ICD-10-CM | POA: Diagnosis not present

## 2023-07-10 DIAGNOSIS — R0602 Shortness of breath: Secondary | ICD-10-CM | POA: Diagnosis not present

## 2023-07-10 DIAGNOSIS — U071 COVID-19: Secondary | ICD-10-CM | POA: Diagnosis not present

## 2023-07-10 DIAGNOSIS — N183 Chronic kidney disease, stage 3 unspecified: Secondary | ICD-10-CM | POA: Diagnosis not present

## 2023-07-10 DIAGNOSIS — I110 Hypertensive heart disease with heart failure: Secondary | ICD-10-CM | POA: Diagnosis not present

## 2023-07-10 DIAGNOSIS — G4733 Obstructive sleep apnea (adult) (pediatric): Secondary | ICD-10-CM | POA: Diagnosis not present

## 2023-07-10 DIAGNOSIS — M109 Gout, unspecified: Secondary | ICD-10-CM | POA: Diagnosis not present

## 2023-07-10 DIAGNOSIS — I509 Heart failure, unspecified: Secondary | ICD-10-CM | POA: Diagnosis not present

## 2023-07-10 DIAGNOSIS — J811 Chronic pulmonary edema: Secondary | ICD-10-CM | POA: Diagnosis not present

## 2023-07-10 DIAGNOSIS — R0600 Dyspnea, unspecified: Secondary | ICD-10-CM | POA: Diagnosis not present

## 2023-07-10 DIAGNOSIS — I130 Hypertensive heart and chronic kidney disease with heart failure and stage 1 through stage 4 chronic kidney disease, or unspecified chronic kidney disease: Secondary | ICD-10-CM | POA: Diagnosis not present

## 2023-07-10 DIAGNOSIS — E785 Hyperlipidemia, unspecified: Secondary | ICD-10-CM | POA: Diagnosis not present

## 2023-07-10 DIAGNOSIS — F4320 Adjustment disorder, unspecified: Secondary | ICD-10-CM | POA: Diagnosis not present

## 2023-07-10 NOTE — Telephone Encounter (Signed)
 Patient in ED.

## 2023-07-10 NOTE — Telephone Encounter (Signed)
 Copied from CRM [id number]. Topic: Clinical - Medical Advice >> Jul 10, 2023 10:03 AM Clayton Bibles wrote: Reason for CRM: Daughter, Bonita Quin, is calling to let her doctor know that she is at [facility] emergency room with COVID and her potassium level was a 3. Emergency room is running more test. Bonita Quin was checking to see if chest x-ray results were in because she wanted to compare with hospital's chest x-ray. Please message Bonita Quin through [place].

## 2023-07-10 NOTE — Telephone Encounter (Signed)
 Spoke to patient's daughter.  Patient currently at [facility].  Patient's daughter stated she was having SOB, so they took her to the ED.  She stated patient has Covid and and K+ was low as well.  Patient also struggling with the loss of her mother on Monday.  Patient daughter requested most recent lab results drawn to be emailed to her.  I have printed from [facility] and emailed them to her at [email or web address] Dr. Rivka Safer and Dr. Drue Second are aware Tina Peters [name], CMA

## 2023-07-10 NOTE — Telephone Encounter (Signed)
 We do not have results for the CXR yet.

## 2023-07-10 NOTE — Telephone Encounter (Signed)
-----   Message from Lynn Ito sent at 07/09/2023  8:33 PM EST ----- Regarding: Endocarditis This patient with prosthetic mitral valve endocarditis with Gemella has been on ceftriaxone since 2/4. It has been a month of ceftriaxone and the PCP sent a message on 3/5 that the patient has petechial / ecchymotic lesions on her legs starting a few days ago. A bit strange to develop this picture ( like janeways? After a month on antibiotics) Aram Beecham , if you have a clinic can you see her tomorrow either by video? ( cardiac surgery at [facility] did not want to do any intervention in feb. I wonder whether she needs repay TEE to look at the size of veg, or any LV thrombus( has again) . She is also on eliquis . Pretty complicated history..  Diminque can we get her lab results?  Thanks

## 2023-07-10 NOTE — Telephone Encounter (Signed)
 See result note.

## 2023-07-11 DIAGNOSIS — F4320 Adjustment disorder, unspecified: Secondary | ICD-10-CM | POA: Diagnosis not present

## 2023-07-11 DIAGNOSIS — B49 Unspecified mycosis: Secondary | ICD-10-CM | POA: Diagnosis not present

## 2023-07-11 DIAGNOSIS — J810 Acute pulmonary edema: Secondary | ICD-10-CM | POA: Diagnosis not present

## 2023-07-11 DIAGNOSIS — J1282 Pneumonia due to coronavirus disease 2019: Secondary | ICD-10-CM | POA: Diagnosis not present

## 2023-07-11 DIAGNOSIS — Z953 Presence of xenogenic heart valve: Secondary | ICD-10-CM | POA: Diagnosis not present

## 2023-07-11 DIAGNOSIS — E275 Adrenomedullary hyperfunction: Secondary | ICD-10-CM | POA: Diagnosis not present

## 2023-07-11 DIAGNOSIS — N281 Cyst of kidney, acquired: Secondary | ICD-10-CM | POA: Diagnosis not present

## 2023-07-11 DIAGNOSIS — R918 Other nonspecific abnormal finding of lung field: Secondary | ICD-10-CM | POA: Diagnosis not present

## 2023-07-11 DIAGNOSIS — T82857A Stenosis of cardiac prosthetic devices, implants and grafts, initial encounter: Secondary | ICD-10-CM | POA: Diagnosis not present

## 2023-07-11 DIAGNOSIS — A419 Sepsis, unspecified organism: Secondary | ICD-10-CM | POA: Diagnosis not present

## 2023-07-11 DIAGNOSIS — N189 Chronic kidney disease, unspecified: Secondary | ICD-10-CM | POA: Diagnosis not present

## 2023-07-11 DIAGNOSIS — D72829 Elevated white blood cell count, unspecified: Secondary | ICD-10-CM | POA: Diagnosis not present

## 2023-07-11 DIAGNOSIS — R7881 Bacteremia: Secondary | ICD-10-CM | POA: Diagnosis not present

## 2023-07-11 DIAGNOSIS — Z992 Dependence on renal dialysis: Secondary | ICD-10-CM | POA: Diagnosis not present

## 2023-07-11 DIAGNOSIS — I5181 Takotsubo syndrome: Secondary | ICD-10-CM | POA: Diagnosis not present

## 2023-07-11 DIAGNOSIS — G4733 Obstructive sleep apnea (adult) (pediatric): Secondary | ICD-10-CM | POA: Diagnosis not present

## 2023-07-11 DIAGNOSIS — N179 Acute kidney failure, unspecified: Secondary | ICD-10-CM | POA: Diagnosis not present

## 2023-07-11 DIAGNOSIS — R21 Rash and other nonspecific skin eruption: Secondary | ICD-10-CM | POA: Diagnosis not present

## 2023-07-11 DIAGNOSIS — Z03823 Encounter for observation for suspected inserted (injected) foreign body ruled out: Secondary | ICD-10-CM | POA: Diagnosis not present

## 2023-07-11 DIAGNOSIS — I110 Hypertensive heart disease with heart failure: Secondary | ICD-10-CM | POA: Diagnosis not present

## 2023-07-11 DIAGNOSIS — I5023 Acute on chronic systolic (congestive) heart failure: Secondary | ICD-10-CM | POA: Diagnosis not present

## 2023-07-11 DIAGNOSIS — T50905A Adverse effect of unspecified drugs, medicaments and biological substances, initial encounter: Secondary | ICD-10-CM | POA: Diagnosis not present

## 2023-07-11 DIAGNOSIS — K838 Other specified diseases of biliary tract: Secondary | ICD-10-CM | POA: Diagnosis not present

## 2023-07-11 DIAGNOSIS — R932 Abnormal findings on diagnostic imaging of liver and biliary tract: Secondary | ICD-10-CM | POA: Diagnosis not present

## 2023-07-11 DIAGNOSIS — I742 Embolism and thrombosis of arteries of the upper extremities: Secondary | ICD-10-CM | POA: Diagnosis not present

## 2023-07-11 DIAGNOSIS — I4510 Unspecified right bundle-branch block: Secondary | ICD-10-CM | POA: Diagnosis not present

## 2023-07-11 DIAGNOSIS — D509 Iron deficiency anemia, unspecified: Secondary | ICD-10-CM | POA: Diagnosis not present

## 2023-07-11 DIAGNOSIS — I82721 Chronic embolism and thrombosis of deep veins of right upper extremity: Secondary | ICD-10-CM | POA: Diagnosis not present

## 2023-07-11 DIAGNOSIS — J988 Other specified respiratory disorders: Secondary | ICD-10-CM | POA: Diagnosis not present

## 2023-07-11 DIAGNOSIS — E8770 Fluid overload, unspecified: Secondary | ICD-10-CM | POA: Diagnosis not present

## 2023-07-11 DIAGNOSIS — M109 Gout, unspecified: Secondary | ICD-10-CM | POA: Diagnosis not present

## 2023-07-11 DIAGNOSIS — E872 Acidosis, unspecified: Secondary | ICD-10-CM | POA: Diagnosis not present

## 2023-07-11 DIAGNOSIS — I82621 Acute embolism and thrombosis of deep veins of right upper extremity: Secondary | ICD-10-CM | POA: Diagnosis not present

## 2023-07-11 DIAGNOSIS — I071 Rheumatic tricuspid insufficiency: Secondary | ICD-10-CM | POA: Diagnosis not present

## 2023-07-11 DIAGNOSIS — R079 Chest pain, unspecified: Secondary | ICD-10-CM | POA: Diagnosis not present

## 2023-07-11 DIAGNOSIS — J9811 Atelectasis: Secondary | ICD-10-CM | POA: Diagnosis not present

## 2023-07-11 DIAGNOSIS — I4892 Unspecified atrial flutter: Secondary | ICD-10-CM | POA: Diagnosis not present

## 2023-07-11 DIAGNOSIS — I38 Endocarditis, valve unspecified: Secondary | ICD-10-CM | POA: Diagnosis not present

## 2023-07-11 DIAGNOSIS — D696 Thrombocytopenia, unspecified: Secondary | ICD-10-CM | POA: Diagnosis not present

## 2023-07-11 DIAGNOSIS — I82612 Acute embolism and thrombosis of superficial veins of left upper extremity: Secondary | ICD-10-CM | POA: Diagnosis not present

## 2023-07-11 DIAGNOSIS — I340 Nonrheumatic mitral (valve) insufficiency: Secondary | ICD-10-CM | POA: Diagnosis not present

## 2023-07-11 DIAGNOSIS — I5043 Acute on chronic combined systolic (congestive) and diastolic (congestive) heart failure: Secondary | ICD-10-CM | POA: Diagnosis not present

## 2023-07-11 DIAGNOSIS — J9600 Acute respiratory failure, unspecified whether with hypoxia or hypercapnia: Secondary | ICD-10-CM | POA: Diagnosis not present

## 2023-07-11 DIAGNOSIS — N183 Chronic kidney disease, stage 3 unspecified: Secondary | ICD-10-CM | POA: Diagnosis not present

## 2023-07-11 DIAGNOSIS — J90 Pleural effusion, not elsewhere classified: Secondary | ICD-10-CM | POA: Diagnosis not present

## 2023-07-11 DIAGNOSIS — B370 Candidal stomatitis: Secondary | ICD-10-CM | POA: Diagnosis not present

## 2023-07-11 DIAGNOSIS — B377 Candidal sepsis: Secondary | ICD-10-CM | POA: Diagnosis not present

## 2023-07-11 DIAGNOSIS — J45909 Unspecified asthma, uncomplicated: Secondary | ICD-10-CM | POA: Diagnosis not present

## 2023-07-11 DIAGNOSIS — T826XXA Infection and inflammatory reaction due to cardiac valve prosthesis, initial encounter: Secondary | ICD-10-CM | POA: Diagnosis not present

## 2023-07-11 DIAGNOSIS — E669 Obesity, unspecified: Secondary | ICD-10-CM | POA: Diagnosis not present

## 2023-07-11 DIAGNOSIS — J9601 Acute respiratory failure with hypoxia: Secondary | ICD-10-CM | POA: Diagnosis not present

## 2023-07-11 DIAGNOSIS — Z9889 Other specified postprocedural states: Secondary | ICD-10-CM | POA: Diagnosis not present

## 2023-07-11 DIAGNOSIS — J984 Other disorders of lung: Secondary | ICD-10-CM | POA: Diagnosis not present

## 2023-07-11 DIAGNOSIS — R638 Other symptoms and signs concerning food and fluid intake: Secondary | ICD-10-CM | POA: Diagnosis not present

## 2023-07-11 DIAGNOSIS — K8020 Calculus of gallbladder without cholecystitis without obstruction: Secondary | ICD-10-CM | POA: Diagnosis not present

## 2023-07-11 DIAGNOSIS — Z9911 Dependence on respirator [ventilator] status: Secondary | ICD-10-CM | POA: Diagnosis not present

## 2023-07-11 DIAGNOSIS — U071 COVID-19: Secondary | ICD-10-CM | POA: Diagnosis not present

## 2023-07-11 DIAGNOSIS — G9341 Metabolic encephalopathy: Secondary | ICD-10-CM | POA: Diagnosis not present

## 2023-07-11 DIAGNOSIS — I509 Heart failure, unspecified: Secondary | ICD-10-CM | POA: Diagnosis not present

## 2023-07-11 DIAGNOSIS — T380X5A Adverse effect of glucocorticoids and synthetic analogues, initial encounter: Secondary | ICD-10-CM | POA: Diagnosis not present

## 2023-07-11 DIAGNOSIS — E870 Hyperosmolality and hypernatremia: Secondary | ICD-10-CM | POA: Diagnosis not present

## 2023-07-11 DIAGNOSIS — D688 Other specified coagulation defects: Secondary | ICD-10-CM | POA: Diagnosis not present

## 2023-07-11 DIAGNOSIS — G4700 Insomnia, unspecified: Secondary | ICD-10-CM | POA: Diagnosis not present

## 2023-07-11 DIAGNOSIS — Z452 Encounter for adjustment and management of vascular access device: Secondary | ICD-10-CM | POA: Diagnosis not present

## 2023-07-11 DIAGNOSIS — R570 Cardiogenic shock: Secondary | ICD-10-CM | POA: Diagnosis not present

## 2023-07-11 DIAGNOSIS — I998 Other disorder of circulatory system: Secondary | ICD-10-CM | POA: Diagnosis not present

## 2023-07-11 DIAGNOSIS — N170 Acute kidney failure with tubular necrosis: Secondary | ICD-10-CM | POA: Diagnosis not present

## 2023-07-11 DIAGNOSIS — G8918 Other acute postprocedural pain: Secondary | ICD-10-CM | POA: Diagnosis not present

## 2023-07-11 DIAGNOSIS — R197 Diarrhea, unspecified: Secondary | ICD-10-CM | POA: Diagnosis not present

## 2023-07-11 DIAGNOSIS — Z952 Presence of prosthetic heart valve: Secondary | ICD-10-CM | POA: Diagnosis not present

## 2023-07-11 DIAGNOSIS — I4891 Unspecified atrial fibrillation: Secondary | ICD-10-CM | POA: Diagnosis not present

## 2023-07-11 DIAGNOSIS — J811 Chronic pulmonary edema: Secondary | ICD-10-CM | POA: Diagnosis not present

## 2023-07-11 DIAGNOSIS — R739 Hyperglycemia, unspecified: Secondary | ICD-10-CM | POA: Diagnosis not present

## 2023-07-11 DIAGNOSIS — Z6839 Body mass index (BMI) 39.0-39.9, adult: Secondary | ICD-10-CM | POA: Diagnosis not present

## 2023-07-11 DIAGNOSIS — D649 Anemia, unspecified: Secondary | ICD-10-CM | POA: Diagnosis not present

## 2023-07-11 DIAGNOSIS — I081 Rheumatic disorders of both mitral and tricuspid valves: Secondary | ICD-10-CM | POA: Diagnosis not present

## 2023-07-11 DIAGNOSIS — R0602 Shortness of breath: Secondary | ICD-10-CM | POA: Diagnosis not present

## 2023-07-11 DIAGNOSIS — Z794 Long term (current) use of insulin: Secondary | ICD-10-CM | POA: Diagnosis not present

## 2023-07-11 DIAGNOSIS — I472 Ventricular tachycardia, unspecified: Secondary | ICD-10-CM | POA: Diagnosis not present

## 2023-07-11 DIAGNOSIS — I272 Pulmonary hypertension, unspecified: Secondary | ICD-10-CM | POA: Diagnosis not present

## 2023-07-11 DIAGNOSIS — M310 Hypersensitivity angiitis: Secondary | ICD-10-CM | POA: Diagnosis not present

## 2023-07-11 DIAGNOSIS — R0600 Dyspnea, unspecified: Secondary | ICD-10-CM | POA: Diagnosis not present

## 2023-07-11 DIAGNOSIS — Z9281 Personal history of extracorporeal membrane oxygenation (ECMO): Secondary | ICD-10-CM | POA: Diagnosis not present

## 2023-07-11 DIAGNOSIS — R109 Unspecified abdominal pain: Secondary | ICD-10-CM | POA: Diagnosis not present

## 2023-07-11 DIAGNOSIS — I484 Atypical atrial flutter: Secondary | ICD-10-CM | POA: Diagnosis not present

## 2023-07-11 DIAGNOSIS — R0902 Hypoxemia: Secondary | ICD-10-CM | POA: Diagnosis not present

## 2023-07-11 DIAGNOSIS — I471 Supraventricular tachycardia, unspecified: Secondary | ICD-10-CM | POA: Diagnosis not present

## 2023-07-11 DIAGNOSIS — I059 Rheumatic mitral valve disease, unspecified: Secondary | ICD-10-CM | POA: Diagnosis not present

## 2023-07-11 DIAGNOSIS — T8203XA Leakage of heart valve prosthesis, initial encounter: Secondary | ICD-10-CM | POA: Diagnosis not present

## 2023-07-11 DIAGNOSIS — I5033 Acute on chronic diastolic (congestive) heart failure: Secondary | ICD-10-CM | POA: Diagnosis not present

## 2023-07-11 DIAGNOSIS — F05 Delirium due to known physiological condition: Secondary | ICD-10-CM | POA: Diagnosis not present

## 2023-07-11 DIAGNOSIS — Z9989 Dependence on other enabling machines and devices: Secondary | ICD-10-CM | POA: Diagnosis not present

## 2023-07-11 DIAGNOSIS — R7401 Elevation of levels of liver transaminase levels: Secondary | ICD-10-CM | POA: Diagnosis not present

## 2023-07-11 DIAGNOSIS — F419 Anxiety disorder, unspecified: Secondary | ICD-10-CM | POA: Diagnosis not present

## 2023-07-11 DIAGNOSIS — R410 Disorientation, unspecified: Secondary | ICD-10-CM | POA: Diagnosis not present

## 2023-07-11 DIAGNOSIS — Z4682 Encounter for fitting and adjustment of non-vascular catheter: Secondary | ICD-10-CM | POA: Diagnosis not present

## 2023-07-11 DIAGNOSIS — I330 Acute and subacute infective endocarditis: Secondary | ICD-10-CM | POA: Diagnosis not present

## 2023-07-11 DIAGNOSIS — Z66 Do not resuscitate: Secondary | ICD-10-CM | POA: Diagnosis not present

## 2023-07-11 DIAGNOSIS — J684 Chronic respiratory conditions due to chemicals, gases, fumes and vapors: Secondary | ICD-10-CM | POA: Diagnosis not present

## 2023-07-11 DIAGNOSIS — R945 Abnormal results of liver function studies: Secondary | ICD-10-CM | POA: Diagnosis not present

## 2023-07-11 DIAGNOSIS — K828 Other specified diseases of gallbladder: Secondary | ICD-10-CM | POA: Diagnosis not present

## 2023-07-11 DIAGNOSIS — Z7901 Long term (current) use of anticoagulants: Secondary | ICD-10-CM | POA: Diagnosis not present

## 2023-07-11 DIAGNOSIS — D62 Acute posthemorrhagic anemia: Secondary | ICD-10-CM | POA: Diagnosis not present

## 2023-07-11 DIAGNOSIS — I5021 Acute systolic (congestive) heart failure: Secondary | ICD-10-CM | POA: Diagnosis not present

## 2023-07-11 DIAGNOSIS — Z6838 Body mass index (BMI) 38.0-38.9, adult: Secondary | ICD-10-CM | POA: Diagnosis not present

## 2023-07-11 DIAGNOSIS — E875 Hyperkalemia: Secondary | ICD-10-CM | POA: Diagnosis not present

## 2023-07-11 DIAGNOSIS — I82C11 Acute embolism and thrombosis of right internal jugular vein: Secondary | ICD-10-CM | POA: Diagnosis not present

## 2023-07-11 DIAGNOSIS — N289 Disorder of kidney and ureter, unspecified: Secondary | ICD-10-CM | POA: Diagnosis not present

## 2023-07-11 DIAGNOSIS — R579 Shock, unspecified: Secondary | ICD-10-CM | POA: Diagnosis not present

## 2023-07-11 DIAGNOSIS — I5030 Unspecified diastolic (congestive) heart failure: Secondary | ICD-10-CM | POA: Diagnosis not present

## 2023-07-11 DIAGNOSIS — I130 Hypertensive heart and chronic kidney disease with heart failure and stage 1 through stage 4 chronic kidney disease, or unspecified chronic kidney disease: Secondary | ICD-10-CM | POA: Diagnosis not present

## 2023-07-12 DIAGNOSIS — G4700 Insomnia, unspecified: Secondary | ICD-10-CM | POA: Diagnosis not present

## 2023-07-12 DIAGNOSIS — R21 Rash and other nonspecific skin eruption: Secondary | ICD-10-CM | POA: Diagnosis not present

## 2023-07-12 DIAGNOSIS — R410 Disorientation, unspecified: Secondary | ICD-10-CM | POA: Diagnosis not present

## 2023-07-12 DIAGNOSIS — I130 Hypertensive heart and chronic kidney disease with heart failure and stage 1 through stage 4 chronic kidney disease, or unspecified chronic kidney disease: Secondary | ICD-10-CM | POA: Diagnosis not present

## 2023-07-12 DIAGNOSIS — F4320 Adjustment disorder, unspecified: Secondary | ICD-10-CM | POA: Diagnosis not present

## 2023-07-12 DIAGNOSIS — I330 Acute and subacute infective endocarditis: Secondary | ICD-10-CM | POA: Diagnosis not present

## 2023-07-12 DIAGNOSIS — I4510 Unspecified right bundle-branch block: Secondary | ICD-10-CM | POA: Diagnosis not present

## 2023-07-12 DIAGNOSIS — U071 COVID-19: Secondary | ICD-10-CM | POA: Diagnosis not present

## 2023-07-12 DIAGNOSIS — N183 Chronic kidney disease, stage 3 unspecified: Secondary | ICD-10-CM | POA: Diagnosis not present

## 2023-07-12 DIAGNOSIS — F419 Anxiety disorder, unspecified: Secondary | ICD-10-CM | POA: Diagnosis not present

## 2023-07-12 DIAGNOSIS — I5033 Acute on chronic diastolic (congestive) heart failure: Secondary | ICD-10-CM | POA: Diagnosis not present

## 2023-07-12 DIAGNOSIS — M310 Hypersensitivity angiitis: Secondary | ICD-10-CM | POA: Diagnosis not present

## 2023-07-12 DIAGNOSIS — D509 Iron deficiency anemia, unspecified: Secondary | ICD-10-CM | POA: Diagnosis not present

## 2023-07-13 DIAGNOSIS — I4891 Unspecified atrial fibrillation: Secondary | ICD-10-CM | POA: Diagnosis not present

## 2023-07-13 DIAGNOSIS — F419 Anxiety disorder, unspecified: Secondary | ICD-10-CM | POA: Diagnosis not present

## 2023-07-13 DIAGNOSIS — I4892 Unspecified atrial flutter: Secondary | ICD-10-CM | POA: Diagnosis not present

## 2023-07-13 DIAGNOSIS — J90 Pleural effusion, not elsewhere classified: Secondary | ICD-10-CM | POA: Diagnosis not present

## 2023-07-13 DIAGNOSIS — D509 Iron deficiency anemia, unspecified: Secondary | ICD-10-CM | POA: Diagnosis not present

## 2023-07-13 DIAGNOSIS — U071 COVID-19: Secondary | ICD-10-CM | POA: Diagnosis not present

## 2023-07-13 DIAGNOSIS — R0902 Hypoxemia: Secondary | ICD-10-CM | POA: Diagnosis not present

## 2023-07-13 DIAGNOSIS — I38 Endocarditis, valve unspecified: Secondary | ICD-10-CM | POA: Diagnosis not present

## 2023-07-13 DIAGNOSIS — J811 Chronic pulmonary edema: Secondary | ICD-10-CM | POA: Diagnosis not present

## 2023-07-13 DIAGNOSIS — R21 Rash and other nonspecific skin eruption: Secondary | ICD-10-CM | POA: Diagnosis not present

## 2023-07-13 DIAGNOSIS — I4510 Unspecified right bundle-branch block: Secondary | ICD-10-CM | POA: Diagnosis not present

## 2023-07-13 DIAGNOSIS — I130 Hypertensive heart and chronic kidney disease with heart failure and stage 1 through stage 4 chronic kidney disease, or unspecified chronic kidney disease: Secondary | ICD-10-CM | POA: Diagnosis not present

## 2023-07-13 DIAGNOSIS — F4320 Adjustment disorder, unspecified: Secondary | ICD-10-CM | POA: Diagnosis not present

## 2023-07-13 DIAGNOSIS — I5033 Acute on chronic diastolic (congestive) heart failure: Secondary | ICD-10-CM | POA: Diagnosis not present

## 2023-07-13 DIAGNOSIS — R0600 Dyspnea, unspecified: Secondary | ICD-10-CM | POA: Diagnosis not present

## 2023-07-13 DIAGNOSIS — N183 Chronic kidney disease, stage 3 unspecified: Secondary | ICD-10-CM | POA: Diagnosis not present

## 2023-07-14 DIAGNOSIS — F419 Anxiety disorder, unspecified: Secondary | ICD-10-CM | POA: Diagnosis not present

## 2023-07-14 DIAGNOSIS — I059 Rheumatic mitral valve disease, unspecified: Secondary | ICD-10-CM | POA: Diagnosis not present

## 2023-07-14 DIAGNOSIS — D696 Thrombocytopenia, unspecified: Secondary | ICD-10-CM | POA: Diagnosis not present

## 2023-07-14 DIAGNOSIS — N183 Chronic kidney disease, stage 3 unspecified: Secondary | ICD-10-CM | POA: Diagnosis not present

## 2023-07-14 DIAGNOSIS — G4700 Insomnia, unspecified: Secondary | ICD-10-CM | POA: Diagnosis not present

## 2023-07-14 DIAGNOSIS — U071 COVID-19: Secondary | ICD-10-CM | POA: Diagnosis not present

## 2023-07-14 DIAGNOSIS — G4733 Obstructive sleep apnea (adult) (pediatric): Secondary | ICD-10-CM | POA: Diagnosis not present

## 2023-07-14 DIAGNOSIS — I5033 Acute on chronic diastolic (congestive) heart failure: Secondary | ICD-10-CM | POA: Diagnosis not present

## 2023-07-14 DIAGNOSIS — Z9989 Dependence on other enabling machines and devices: Secondary | ICD-10-CM | POA: Diagnosis not present

## 2023-07-14 DIAGNOSIS — F05 Delirium due to known physiological condition: Secondary | ICD-10-CM | POA: Diagnosis not present

## 2023-07-14 DIAGNOSIS — M109 Gout, unspecified: Secondary | ICD-10-CM | POA: Diagnosis not present

## 2023-07-14 DIAGNOSIS — I130 Hypertensive heart and chronic kidney disease with heart failure and stage 1 through stage 4 chronic kidney disease, or unspecified chronic kidney disease: Secondary | ICD-10-CM | POA: Diagnosis not present

## 2023-07-15 DIAGNOSIS — U071 COVID-19: Secondary | ICD-10-CM | POA: Diagnosis not present

## 2023-07-15 DIAGNOSIS — I330 Acute and subacute infective endocarditis: Secondary | ICD-10-CM | POA: Diagnosis not present

## 2023-07-15 DIAGNOSIS — Z953 Presence of xenogenic heart valve: Secondary | ICD-10-CM | POA: Diagnosis not present

## 2023-07-15 DIAGNOSIS — I484 Atypical atrial flutter: Secondary | ICD-10-CM | POA: Diagnosis not present

## 2023-07-15 DIAGNOSIS — I5033 Acute on chronic diastolic (congestive) heart failure: Secondary | ICD-10-CM | POA: Diagnosis not present

## 2023-07-15 DIAGNOSIS — M310 Hypersensitivity angiitis: Secondary | ICD-10-CM | POA: Diagnosis not present

## 2023-07-16 DIAGNOSIS — Z953 Presence of xenogenic heart valve: Secondary | ICD-10-CM | POA: Diagnosis not present

## 2023-07-16 DIAGNOSIS — I330 Acute and subacute infective endocarditis: Secondary | ICD-10-CM | POA: Diagnosis not present

## 2023-07-16 DIAGNOSIS — I5033 Acute on chronic diastolic (congestive) heart failure: Secondary | ICD-10-CM | POA: Diagnosis not present

## 2023-07-16 DIAGNOSIS — I484 Atypical atrial flutter: Secondary | ICD-10-CM | POA: Diagnosis not present

## 2023-07-16 DIAGNOSIS — U071 COVID-19: Secondary | ICD-10-CM | POA: Diagnosis not present

## 2023-07-17 ENCOUNTER — Inpatient Hospital Stay: Payer: Medicare HMO | Admitting: Infectious Diseases

## 2023-07-17 DIAGNOSIS — I484 Atypical atrial flutter: Secondary | ICD-10-CM | POA: Diagnosis not present

## 2023-07-17 DIAGNOSIS — I330 Acute and subacute infective endocarditis: Secondary | ICD-10-CM | POA: Diagnosis not present

## 2023-07-17 DIAGNOSIS — I5033 Acute on chronic diastolic (congestive) heart failure: Secondary | ICD-10-CM | POA: Diagnosis not present

## 2023-07-17 DIAGNOSIS — J811 Chronic pulmonary edema: Secondary | ICD-10-CM | POA: Diagnosis not present

## 2023-07-17 DIAGNOSIS — Z953 Presence of xenogenic heart valve: Secondary | ICD-10-CM | POA: Diagnosis not present

## 2023-07-17 DIAGNOSIS — R918 Other nonspecific abnormal finding of lung field: Secondary | ICD-10-CM | POA: Diagnosis not present

## 2023-07-17 DIAGNOSIS — U071 COVID-19: Secondary | ICD-10-CM | POA: Diagnosis not present

## 2023-07-18 ENCOUNTER — Telehealth: Payer: Self-pay | Admitting: Family Medicine

## 2023-07-18 DIAGNOSIS — U071 COVID-19: Secondary | ICD-10-CM | POA: Diagnosis not present

## 2023-07-18 DIAGNOSIS — I484 Atypical atrial flutter: Secondary | ICD-10-CM | POA: Diagnosis not present

## 2023-07-18 DIAGNOSIS — I330 Acute and subacute infective endocarditis: Secondary | ICD-10-CM | POA: Diagnosis not present

## 2023-07-18 DIAGNOSIS — Z953 Presence of xenogenic heart valve: Secondary | ICD-10-CM | POA: Diagnosis not present

## 2023-07-18 DIAGNOSIS — I5033 Acute on chronic diastolic (congestive) heart failure: Secondary | ICD-10-CM | POA: Diagnosis not present

## 2023-07-18 NOTE — Telephone Encounter (Signed)
 [facility] is t

## 2023-07-19 ENCOUNTER — Other Ambulatory Visit: Payer: Self-pay | Admitting: Cardiology

## 2023-07-19 ENCOUNTER — Other Ambulatory Visit: Payer: Self-pay | Admitting: Nurse Practitioner

## 2023-07-19 DIAGNOSIS — I4819 Other persistent atrial fibrillation: Secondary | ICD-10-CM

## 2023-07-19 DIAGNOSIS — I484 Atypical atrial flutter: Secondary | ICD-10-CM | POA: Diagnosis not present

## 2023-07-19 DIAGNOSIS — I5033 Acute on chronic diastolic (congestive) heart failure: Secondary | ICD-10-CM | POA: Diagnosis not present

## 2023-07-19 DIAGNOSIS — I330 Acute and subacute infective endocarditis: Secondary | ICD-10-CM | POA: Diagnosis not present

## 2023-07-19 DIAGNOSIS — Z953 Presence of xenogenic heart valve: Secondary | ICD-10-CM | POA: Diagnosis not present

## 2023-07-21 DIAGNOSIS — Z4682 Encounter for fitting and adjustment of non-vascular catheter: Secondary | ICD-10-CM | POA: Diagnosis not present

## 2023-07-21 DIAGNOSIS — D62 Acute posthemorrhagic anemia: Secondary | ICD-10-CM | POA: Diagnosis not present

## 2023-07-21 DIAGNOSIS — Z452 Encounter for adjustment and management of vascular access device: Secondary | ICD-10-CM | POA: Diagnosis not present

## 2023-07-21 DIAGNOSIS — J9811 Atelectasis: Secondary | ICD-10-CM | POA: Diagnosis not present

## 2023-07-21 DIAGNOSIS — J984 Other disorders of lung: Secondary | ICD-10-CM | POA: Diagnosis not present

## 2023-07-21 DIAGNOSIS — Z9889 Other specified postprocedural states: Secondary | ICD-10-CM | POA: Diagnosis not present

## 2023-07-21 DIAGNOSIS — E872 Acidosis, unspecified: Secondary | ICD-10-CM | POA: Diagnosis not present

## 2023-07-21 DIAGNOSIS — J811 Chronic pulmonary edema: Secondary | ICD-10-CM | POA: Diagnosis not present

## 2023-07-21 DIAGNOSIS — G8918 Other acute postprocedural pain: Secondary | ICD-10-CM | POA: Diagnosis not present

## 2023-07-21 DIAGNOSIS — I330 Acute and subacute infective endocarditis: Secondary | ICD-10-CM | POA: Diagnosis not present

## 2023-07-21 DIAGNOSIS — R570 Cardiogenic shock: Secondary | ICD-10-CM | POA: Diagnosis not present

## 2023-07-21 DIAGNOSIS — I340 Nonrheumatic mitral (valve) insufficiency: Secondary | ICD-10-CM | POA: Diagnosis not present

## 2023-07-22 DIAGNOSIS — R0600 Dyspnea, unspecified: Secondary | ICD-10-CM | POA: Diagnosis not present

## 2023-07-22 DIAGNOSIS — Z9889 Other specified postprocedural states: Secondary | ICD-10-CM | POA: Diagnosis not present

## 2023-07-22 DIAGNOSIS — I509 Heart failure, unspecified: Secondary | ICD-10-CM | POA: Diagnosis not present

## 2023-07-22 DIAGNOSIS — Z794 Long term (current) use of insulin: Secondary | ICD-10-CM | POA: Diagnosis not present

## 2023-07-22 DIAGNOSIS — D62 Acute posthemorrhagic anemia: Secondary | ICD-10-CM | POA: Diagnosis not present

## 2023-07-22 DIAGNOSIS — Z6838 Body mass index (BMI) 38.0-38.9, adult: Secondary | ICD-10-CM | POA: Diagnosis not present

## 2023-07-22 DIAGNOSIS — J984 Other disorders of lung: Secondary | ICD-10-CM | POA: Diagnosis not present

## 2023-07-22 DIAGNOSIS — R570 Cardiogenic shock: Secondary | ICD-10-CM | POA: Diagnosis not present

## 2023-07-22 DIAGNOSIS — E872 Acidosis, unspecified: Secondary | ICD-10-CM | POA: Diagnosis not present

## 2023-07-22 DIAGNOSIS — R638 Other symptoms and signs concerning food and fluid intake: Secondary | ICD-10-CM | POA: Diagnosis not present

## 2023-07-22 DIAGNOSIS — J9811 Atelectasis: Secondary | ICD-10-CM | POA: Diagnosis not present

## 2023-07-22 DIAGNOSIS — T380X5A Adverse effect of glucocorticoids and synthetic analogues, initial encounter: Secondary | ICD-10-CM | POA: Diagnosis not present

## 2023-07-22 DIAGNOSIS — E669 Obesity, unspecified: Secondary | ICD-10-CM | POA: Diagnosis not present

## 2023-07-22 DIAGNOSIS — N179 Acute kidney failure, unspecified: Secondary | ICD-10-CM | POA: Diagnosis not present

## 2023-07-23 DIAGNOSIS — I340 Nonrheumatic mitral (valve) insufficiency: Secondary | ICD-10-CM | POA: Diagnosis not present

## 2023-07-23 DIAGNOSIS — I509 Heart failure, unspecified: Secondary | ICD-10-CM | POA: Diagnosis not present

## 2023-07-23 DIAGNOSIS — R570 Cardiogenic shock: Secondary | ICD-10-CM | POA: Diagnosis not present

## 2023-07-23 DIAGNOSIS — Z452 Encounter for adjustment and management of vascular access device: Secondary | ICD-10-CM | POA: Diagnosis not present

## 2023-07-23 DIAGNOSIS — R638 Other symptoms and signs concerning food and fluid intake: Secondary | ICD-10-CM | POA: Diagnosis not present

## 2023-07-23 DIAGNOSIS — J984 Other disorders of lung: Secondary | ICD-10-CM | POA: Diagnosis not present

## 2023-07-23 DIAGNOSIS — Z03823 Encounter for observation for suspected inserted (injected) foreign body ruled out: Secondary | ICD-10-CM | POA: Diagnosis not present

## 2023-07-23 DIAGNOSIS — N179 Acute kidney failure, unspecified: Secondary | ICD-10-CM | POA: Diagnosis not present

## 2023-07-23 DIAGNOSIS — I38 Endocarditis, valve unspecified: Secondary | ICD-10-CM | POA: Diagnosis not present

## 2023-07-23 DIAGNOSIS — E669 Obesity, unspecified: Secondary | ICD-10-CM | POA: Diagnosis not present

## 2023-07-23 DIAGNOSIS — T826XXA Infection and inflammatory reaction due to cardiac valve prosthesis, initial encounter: Secondary | ICD-10-CM | POA: Diagnosis not present

## 2023-07-23 DIAGNOSIS — Z6838 Body mass index (BMI) 38.0-38.9, adult: Secondary | ICD-10-CM | POA: Diagnosis not present

## 2023-07-23 DIAGNOSIS — T380X5A Adverse effect of glucocorticoids and synthetic analogues, initial encounter: Secondary | ICD-10-CM | POA: Diagnosis not present

## 2023-07-23 DIAGNOSIS — R918 Other nonspecific abnormal finding of lung field: Secondary | ICD-10-CM | POA: Diagnosis not present

## 2023-07-23 DIAGNOSIS — Z794 Long term (current) use of insulin: Secondary | ICD-10-CM | POA: Diagnosis not present

## 2023-07-23 DIAGNOSIS — E872 Acidosis, unspecified: Secondary | ICD-10-CM | POA: Diagnosis not present

## 2023-07-23 DIAGNOSIS — D62 Acute posthemorrhagic anemia: Secondary | ICD-10-CM | POA: Diagnosis not present

## 2023-07-23 DIAGNOSIS — Z4682 Encounter for fitting and adjustment of non-vascular catheter: Secondary | ICD-10-CM | POA: Diagnosis not present

## 2023-07-24 DIAGNOSIS — R918 Other nonspecific abnormal finding of lung field: Secondary | ICD-10-CM | POA: Diagnosis not present

## 2023-07-24 DIAGNOSIS — N179 Acute kidney failure, unspecified: Secondary | ICD-10-CM | POA: Diagnosis not present

## 2023-07-24 DIAGNOSIS — E275 Adrenomedullary hyperfunction: Secondary | ICD-10-CM | POA: Diagnosis not present

## 2023-07-24 DIAGNOSIS — Z6838 Body mass index (BMI) 38.0-38.9, adult: Secondary | ICD-10-CM | POA: Diagnosis not present

## 2023-07-24 DIAGNOSIS — J811 Chronic pulmonary edema: Secondary | ICD-10-CM | POA: Diagnosis not present

## 2023-07-24 DIAGNOSIS — Z452 Encounter for adjustment and management of vascular access device: Secondary | ICD-10-CM | POA: Diagnosis not present

## 2023-07-24 DIAGNOSIS — J90 Pleural effusion, not elsewhere classified: Secondary | ICD-10-CM | POA: Diagnosis not present

## 2023-07-24 DIAGNOSIS — R638 Other symptoms and signs concerning food and fluid intake: Secondary | ICD-10-CM | POA: Diagnosis not present

## 2023-07-24 DIAGNOSIS — E669 Obesity, unspecified: Secondary | ICD-10-CM | POA: Diagnosis not present

## 2023-07-24 DIAGNOSIS — I509 Heart failure, unspecified: Secondary | ICD-10-CM | POA: Diagnosis not present

## 2023-07-24 DIAGNOSIS — Z794 Long term (current) use of insulin: Secondary | ICD-10-CM | POA: Diagnosis not present

## 2023-07-24 DIAGNOSIS — T380X5A Adverse effect of glucocorticoids and synthetic analogues, initial encounter: Secondary | ICD-10-CM | POA: Diagnosis not present

## 2023-07-24 DIAGNOSIS — Z4682 Encounter for fitting and adjustment of non-vascular catheter: Secondary | ICD-10-CM | POA: Diagnosis not present

## 2023-07-25 DIAGNOSIS — Z4682 Encounter for fitting and adjustment of non-vascular catheter: Secondary | ICD-10-CM | POA: Diagnosis not present

## 2023-07-25 DIAGNOSIS — R739 Hyperglycemia, unspecified: Secondary | ICD-10-CM | POA: Diagnosis not present

## 2023-07-25 DIAGNOSIS — I330 Acute and subacute infective endocarditis: Secondary | ICD-10-CM | POA: Diagnosis not present

## 2023-07-25 DIAGNOSIS — N179 Acute kidney failure, unspecified: Secondary | ICD-10-CM | POA: Diagnosis not present

## 2023-07-25 DIAGNOSIS — R570 Cardiogenic shock: Secondary | ICD-10-CM | POA: Diagnosis not present

## 2023-07-25 DIAGNOSIS — Z452 Encounter for adjustment and management of vascular access device: Secondary | ICD-10-CM | POA: Diagnosis not present

## 2023-07-25 DIAGNOSIS — Z6839 Body mass index (BMI) 39.0-39.9, adult: Secondary | ICD-10-CM | POA: Diagnosis not present

## 2023-07-25 DIAGNOSIS — J984 Other disorders of lung: Secondary | ICD-10-CM | POA: Diagnosis not present

## 2023-07-25 DIAGNOSIS — Z952 Presence of prosthetic heart valve: Secondary | ICD-10-CM | POA: Diagnosis not present

## 2023-07-25 DIAGNOSIS — T50905A Adverse effect of unspecified drugs, medicaments and biological substances, initial encounter: Secondary | ICD-10-CM | POA: Diagnosis not present

## 2023-07-25 DIAGNOSIS — Z9911 Dependence on respirator [ventilator] status: Secondary | ICD-10-CM | POA: Diagnosis not present

## 2023-07-25 DIAGNOSIS — J9811 Atelectasis: Secondary | ICD-10-CM | POA: Diagnosis not present

## 2023-07-25 DIAGNOSIS — Z9889 Other specified postprocedural states: Secondary | ICD-10-CM | POA: Diagnosis not present

## 2023-07-25 DIAGNOSIS — E669 Obesity, unspecified: Secondary | ICD-10-CM | POA: Diagnosis not present

## 2023-07-25 DIAGNOSIS — I509 Heart failure, unspecified: Secondary | ICD-10-CM | POA: Diagnosis not present

## 2023-07-25 DIAGNOSIS — D62 Acute posthemorrhagic anemia: Secondary | ICD-10-CM | POA: Diagnosis not present

## 2023-07-25 DIAGNOSIS — N189 Chronic kidney disease, unspecified: Secondary | ICD-10-CM | POA: Diagnosis not present

## 2023-07-26 DIAGNOSIS — Z9911 Dependence on respirator [ventilator] status: Secondary | ICD-10-CM | POA: Diagnosis not present

## 2023-07-26 DIAGNOSIS — Z9281 Personal history of extracorporeal membrane oxygenation (ECMO): Secondary | ICD-10-CM | POA: Diagnosis not present

## 2023-07-26 DIAGNOSIS — Z9889 Other specified postprocedural states: Secondary | ICD-10-CM | POA: Diagnosis not present

## 2023-07-26 DIAGNOSIS — J811 Chronic pulmonary edema: Secondary | ICD-10-CM | POA: Diagnosis not present

## 2023-07-26 DIAGNOSIS — Z953 Presence of xenogenic heart valve: Secondary | ICD-10-CM | POA: Diagnosis not present

## 2023-07-26 DIAGNOSIS — N179 Acute kidney failure, unspecified: Secondary | ICD-10-CM | POA: Diagnosis not present

## 2023-07-27 DIAGNOSIS — Z9889 Other specified postprocedural states: Secondary | ICD-10-CM | POA: Diagnosis not present

## 2023-07-27 DIAGNOSIS — J811 Chronic pulmonary edema: Secondary | ICD-10-CM | POA: Diagnosis not present

## 2023-07-27 DIAGNOSIS — Z953 Presence of xenogenic heart valve: Secondary | ICD-10-CM | POA: Diagnosis not present

## 2023-07-27 DIAGNOSIS — Z4682 Encounter for fitting and adjustment of non-vascular catheter: Secondary | ICD-10-CM | POA: Diagnosis not present

## 2023-07-27 DIAGNOSIS — Z9281 Personal history of extracorporeal membrane oxygenation (ECMO): Secondary | ICD-10-CM | POA: Diagnosis not present

## 2023-07-27 DIAGNOSIS — N179 Acute kidney failure, unspecified: Secondary | ICD-10-CM | POA: Diagnosis not present

## 2023-07-28 ENCOUNTER — Telehealth: Payer: Self-pay | Admitting: Cardiology

## 2023-07-28 DIAGNOSIS — Z953 Presence of xenogenic heart valve: Secondary | ICD-10-CM | POA: Diagnosis not present

## 2023-07-28 DIAGNOSIS — I509 Heart failure, unspecified: Secondary | ICD-10-CM | POA: Diagnosis not present

## 2023-07-28 DIAGNOSIS — D72829 Elevated white blood cell count, unspecified: Secondary | ICD-10-CM | POA: Diagnosis not present

## 2023-07-28 DIAGNOSIS — Z4682 Encounter for fitting and adjustment of non-vascular catheter: Secondary | ICD-10-CM | POA: Diagnosis not present

## 2023-07-28 DIAGNOSIS — E669 Obesity, unspecified: Secondary | ICD-10-CM | POA: Diagnosis not present

## 2023-07-28 DIAGNOSIS — J811 Chronic pulmonary edema: Secondary | ICD-10-CM | POA: Diagnosis not present

## 2023-07-28 DIAGNOSIS — Z452 Encounter for adjustment and management of vascular access device: Secondary | ICD-10-CM | POA: Diagnosis not present

## 2023-07-28 DIAGNOSIS — T50905A Adverse effect of unspecified drugs, medicaments and biological substances, initial encounter: Secondary | ICD-10-CM | POA: Diagnosis not present

## 2023-07-28 DIAGNOSIS — R918 Other nonspecific abnormal finding of lung field: Secondary | ICD-10-CM | POA: Diagnosis not present

## 2023-07-28 DIAGNOSIS — Z6838 Body mass index (BMI) 38.0-38.9, adult: Secondary | ICD-10-CM | POA: Diagnosis not present

## 2023-07-28 DIAGNOSIS — Z9889 Other specified postprocedural states: Secondary | ICD-10-CM | POA: Diagnosis not present

## 2023-07-28 DIAGNOSIS — R739 Hyperglycemia, unspecified: Secondary | ICD-10-CM | POA: Diagnosis not present

## 2023-07-28 DIAGNOSIS — N179 Acute kidney failure, unspecified: Secondary | ICD-10-CM | POA: Diagnosis not present

## 2023-07-28 DIAGNOSIS — E875 Hyperkalemia: Secondary | ICD-10-CM | POA: Diagnosis not present

## 2023-07-28 NOTE — Telephone Encounter (Signed)
 Lvm to confirm appt 07/29/23

## 2023-07-28 NOTE — Progress Notes (Deleted)
   ADVANCED HEART FAILURE CLINIC NOTE  Referring Physician: Erasmo Downer, MD  Primary Care: Erasmo Downer, MD Primary Cardiologist:  CC: ***  HPI: Tina Peters is a 73 y.o. female with paroxysmal atrial fibrillation, tachybradycardia syndrome, hypertension, hyperlipidemia, history of stroke, sleep apnea on CPAP, chronic kidney disease, endocarditis status post MVR, asthma, recently diagnosed prosthetic mitral valve endocarditis and pulmonary hypertension/RV failure presenting today to establish care.   Activity level/exercise tolerance:  *** Orthopnea:  Sleeps on *** pillows Paroxysmal noctural dyspnea:  *** Chest pain/pressure:  *** Orthostatic lightheadedness:  *** Palpitations:  *** Lower extremity edema:  *** Presyncope/syncope:  *** Cough:  ***  Pertinent Family & social hx:    PHYSICAL EXAM: There were no vitals filed for this visit. GENERAL: NAD Lungs- *** CARDIAC:  JVP: *** cm          Normal rate with regular rhythm. *** murmur.  Pulses ***. *** edema.  ABDOMEN: Soft, non-tender, non-distended.  EXTREMITIES: Warm and well perfused.  NEUROLOGIC: No obvious FND   DATA REVIEW  ECG: ***  As per my personal interpretation  ECHO: 06/21/23:  As per my personal interpretation  CATH: *** As per my personal interpretation   ASSESSMENT & PLAN:  Pulmonary hypertension/RV failure -Echocardiogram from 06/21/2023 with LVEF of 55%, severely dilated right ventricle with moderately reduced function and signs of pressure and volume overload. 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  2.  Prosthetic mitral valve endocarditis -TEE as noted above from February 2025 with thickening of the valve, moderate mitral stenosis and small echodensity at the posterior aspect.   Beonka Amesquita Advanced Heart Failure Mechanical Circulatory Support

## 2023-07-29 ENCOUNTER — Encounter: Payer: Medicare HMO | Admitting: Cardiology

## 2023-07-29 DIAGNOSIS — R739 Hyperglycemia, unspecified: Secondary | ICD-10-CM | POA: Diagnosis not present

## 2023-07-29 DIAGNOSIS — J9811 Atelectasis: Secondary | ICD-10-CM | POA: Diagnosis not present

## 2023-07-29 DIAGNOSIS — Z953 Presence of xenogenic heart valve: Secondary | ICD-10-CM | POA: Diagnosis not present

## 2023-07-29 DIAGNOSIS — R0600 Dyspnea, unspecified: Secondary | ICD-10-CM | POA: Diagnosis not present

## 2023-07-29 DIAGNOSIS — Z6838 Body mass index (BMI) 38.0-38.9, adult: Secondary | ICD-10-CM | POA: Diagnosis not present

## 2023-07-29 DIAGNOSIS — Z794 Long term (current) use of insulin: Secondary | ICD-10-CM | POA: Diagnosis not present

## 2023-07-29 DIAGNOSIS — N179 Acute kidney failure, unspecified: Secondary | ICD-10-CM | POA: Diagnosis not present

## 2023-07-29 DIAGNOSIS — E669 Obesity, unspecified: Secondary | ICD-10-CM | POA: Diagnosis not present

## 2023-07-29 DIAGNOSIS — I509 Heart failure, unspecified: Secondary | ICD-10-CM | POA: Diagnosis not present

## 2023-07-29 DIAGNOSIS — R918 Other nonspecific abnormal finding of lung field: Secondary | ICD-10-CM | POA: Diagnosis not present

## 2023-07-29 DIAGNOSIS — T50905A Adverse effect of unspecified drugs, medicaments and biological substances, initial encounter: Secondary | ICD-10-CM | POA: Diagnosis not present

## 2023-07-29 DIAGNOSIS — Z9889 Other specified postprocedural states: Secondary | ICD-10-CM | POA: Diagnosis not present

## 2023-07-30 DIAGNOSIS — I82C11 Acute embolism and thrombosis of right internal jugular vein: Secondary | ICD-10-CM | POA: Diagnosis not present

## 2023-07-30 DIAGNOSIS — Z9889 Other specified postprocedural states: Secondary | ICD-10-CM | POA: Diagnosis not present

## 2023-07-30 DIAGNOSIS — I82612 Acute embolism and thrombosis of superficial veins of left upper extremity: Secondary | ICD-10-CM | POA: Diagnosis not present

## 2023-07-30 DIAGNOSIS — R918 Other nonspecific abnormal finding of lung field: Secondary | ICD-10-CM | POA: Diagnosis not present

## 2023-07-30 DIAGNOSIS — N179 Acute kidney failure, unspecified: Secondary | ICD-10-CM | POA: Diagnosis not present

## 2023-07-30 DIAGNOSIS — Z953 Presence of xenogenic heart valve: Secondary | ICD-10-CM | POA: Diagnosis not present

## 2023-07-30 DIAGNOSIS — R0600 Dyspnea, unspecified: Secondary | ICD-10-CM | POA: Diagnosis not present

## 2023-07-31 DIAGNOSIS — Z953 Presence of xenogenic heart valve: Secondary | ICD-10-CM | POA: Diagnosis not present

## 2023-07-31 DIAGNOSIS — Z9889 Other specified postprocedural states: Secondary | ICD-10-CM | POA: Diagnosis not present

## 2023-07-31 DIAGNOSIS — J9811 Atelectasis: Secondary | ICD-10-CM | POA: Diagnosis not present

## 2023-07-31 DIAGNOSIS — N179 Acute kidney failure, unspecified: Secondary | ICD-10-CM | POA: Diagnosis not present

## 2023-07-31 DIAGNOSIS — J90 Pleural effusion, not elsewhere classified: Secondary | ICD-10-CM | POA: Diagnosis not present

## 2023-07-31 DIAGNOSIS — Z452 Encounter for adjustment and management of vascular access device: Secondary | ICD-10-CM | POA: Diagnosis not present

## 2023-07-31 DIAGNOSIS — Z992 Dependence on renal dialysis: Secondary | ICD-10-CM | POA: Diagnosis not present

## 2023-08-01 ENCOUNTER — Encounter: Payer: Self-pay | Admitting: Family Medicine

## 2023-08-01 DIAGNOSIS — N179 Acute kidney failure, unspecified: Secondary | ICD-10-CM | POA: Diagnosis not present

## 2023-08-01 DIAGNOSIS — J811 Chronic pulmonary edema: Secondary | ICD-10-CM | POA: Diagnosis not present

## 2023-08-01 DIAGNOSIS — Z953 Presence of xenogenic heart valve: Secondary | ICD-10-CM | POA: Diagnosis not present

## 2023-08-01 DIAGNOSIS — Z9889 Other specified postprocedural states: Secondary | ICD-10-CM | POA: Diagnosis not present

## 2023-08-01 DIAGNOSIS — D649 Anemia, unspecified: Secondary | ICD-10-CM | POA: Diagnosis not present

## 2023-08-02 DIAGNOSIS — Z953 Presence of xenogenic heart valve: Secondary | ICD-10-CM | POA: Diagnosis not present

## 2023-08-02 DIAGNOSIS — I509 Heart failure, unspecified: Secondary | ICD-10-CM | POA: Diagnosis not present

## 2023-08-02 DIAGNOSIS — Z9889 Other specified postprocedural states: Secondary | ICD-10-CM | POA: Diagnosis not present

## 2023-08-02 DIAGNOSIS — R0600 Dyspnea, unspecified: Secondary | ICD-10-CM | POA: Diagnosis not present

## 2023-08-02 DIAGNOSIS — R197 Diarrhea, unspecified: Secondary | ICD-10-CM | POA: Diagnosis not present

## 2023-08-03 DIAGNOSIS — Z9889 Other specified postprocedural states: Secondary | ICD-10-CM | POA: Diagnosis not present

## 2023-08-03 DIAGNOSIS — I509 Heart failure, unspecified: Secondary | ICD-10-CM | POA: Diagnosis not present

## 2023-08-03 DIAGNOSIS — R0600 Dyspnea, unspecified: Secondary | ICD-10-CM | POA: Diagnosis not present

## 2023-08-03 DIAGNOSIS — Z953 Presence of xenogenic heart valve: Secondary | ICD-10-CM | POA: Diagnosis not present

## 2023-08-04 DIAGNOSIS — J90 Pleural effusion, not elsewhere classified: Secondary | ICD-10-CM | POA: Diagnosis not present

## 2023-08-04 DIAGNOSIS — Z953 Presence of xenogenic heart valve: Secondary | ICD-10-CM | POA: Diagnosis not present

## 2023-08-04 DIAGNOSIS — J811 Chronic pulmonary edema: Secondary | ICD-10-CM | POA: Diagnosis not present

## 2023-08-04 DIAGNOSIS — Z9889 Other specified postprocedural states: Secondary | ICD-10-CM | POA: Diagnosis not present

## 2023-08-04 DIAGNOSIS — N179 Acute kidney failure, unspecified: Secondary | ICD-10-CM | POA: Diagnosis not present

## 2023-08-04 DIAGNOSIS — D649 Anemia, unspecified: Secondary | ICD-10-CM | POA: Diagnosis not present

## 2023-08-05 DIAGNOSIS — R0600 Dyspnea, unspecified: Secondary | ICD-10-CM | POA: Diagnosis not present

## 2023-08-06 DIAGNOSIS — N179 Acute kidney failure, unspecified: Secondary | ICD-10-CM | POA: Diagnosis not present

## 2023-08-06 DIAGNOSIS — D649 Anemia, unspecified: Secondary | ICD-10-CM | POA: Diagnosis not present

## 2023-08-06 DIAGNOSIS — R0600 Dyspnea, unspecified: Secondary | ICD-10-CM | POA: Diagnosis not present

## 2023-08-07 DIAGNOSIS — R918 Other nonspecific abnormal finding of lung field: Secondary | ICD-10-CM | POA: Diagnosis not present

## 2023-08-07 DIAGNOSIS — Z9889 Other specified postprocedural states: Secondary | ICD-10-CM | POA: Diagnosis not present

## 2023-08-07 DIAGNOSIS — R0600 Dyspnea, unspecified: Secondary | ICD-10-CM | POA: Diagnosis not present

## 2023-08-08 DIAGNOSIS — Z9889 Other specified postprocedural states: Secondary | ICD-10-CM | POA: Diagnosis not present

## 2023-08-08 DIAGNOSIS — N179 Acute kidney failure, unspecified: Secondary | ICD-10-CM | POA: Diagnosis not present

## 2023-08-08 DIAGNOSIS — J811 Chronic pulmonary edema: Secondary | ICD-10-CM | POA: Diagnosis not present

## 2023-08-08 DIAGNOSIS — J90 Pleural effusion, not elsewhere classified: Secondary | ICD-10-CM | POA: Diagnosis not present

## 2023-08-08 DIAGNOSIS — R0600 Dyspnea, unspecified: Secondary | ICD-10-CM | POA: Diagnosis not present

## 2023-08-08 DIAGNOSIS — J9811 Atelectasis: Secondary | ICD-10-CM | POA: Diagnosis not present

## 2023-08-08 DIAGNOSIS — D649 Anemia, unspecified: Secondary | ICD-10-CM | POA: Diagnosis not present

## 2023-08-09 DIAGNOSIS — R0600 Dyspnea, unspecified: Secondary | ICD-10-CM | POA: Diagnosis not present

## 2023-08-09 DIAGNOSIS — J90 Pleural effusion, not elsewhere classified: Secondary | ICD-10-CM | POA: Diagnosis not present

## 2023-08-09 DIAGNOSIS — N179 Acute kidney failure, unspecified: Secondary | ICD-10-CM | POA: Diagnosis not present

## 2023-08-09 DIAGNOSIS — Z952 Presence of prosthetic heart valve: Secondary | ICD-10-CM | POA: Diagnosis not present

## 2023-08-09 DIAGNOSIS — I081 Rheumatic disorders of both mitral and tricuspid valves: Secondary | ICD-10-CM | POA: Diagnosis not present

## 2023-08-09 DIAGNOSIS — J811 Chronic pulmonary edema: Secondary | ICD-10-CM | POA: Diagnosis not present

## 2023-08-09 DIAGNOSIS — T8203XA Leakage of heart valve prosthesis, initial encounter: Secondary | ICD-10-CM | POA: Diagnosis not present

## 2023-08-10 DIAGNOSIS — J90 Pleural effusion, not elsewhere classified: Secondary | ICD-10-CM | POA: Diagnosis not present

## 2023-08-10 DIAGNOSIS — Z452 Encounter for adjustment and management of vascular access device: Secondary | ICD-10-CM | POA: Diagnosis not present

## 2023-08-10 DIAGNOSIS — N179 Acute kidney failure, unspecified: Secondary | ICD-10-CM | POA: Diagnosis not present

## 2023-08-10 DIAGNOSIS — J811 Chronic pulmonary edema: Secondary | ICD-10-CM | POA: Diagnosis not present

## 2023-08-10 DIAGNOSIS — Z9889 Other specified postprocedural states: Secondary | ICD-10-CM | POA: Diagnosis not present

## 2023-08-10 DIAGNOSIS — R0600 Dyspnea, unspecified: Secondary | ICD-10-CM | POA: Diagnosis not present

## 2023-08-11 DIAGNOSIS — J90 Pleural effusion, not elsewhere classified: Secondary | ICD-10-CM | POA: Diagnosis not present

## 2023-08-11 DIAGNOSIS — N179 Acute kidney failure, unspecified: Secondary | ICD-10-CM | POA: Diagnosis not present

## 2023-08-11 DIAGNOSIS — R0600 Dyspnea, unspecified: Secondary | ICD-10-CM | POA: Diagnosis not present

## 2023-08-11 DIAGNOSIS — B49 Unspecified mycosis: Secondary | ICD-10-CM | POA: Diagnosis not present

## 2023-08-11 DIAGNOSIS — J984 Other disorders of lung: Secondary | ICD-10-CM | POA: Diagnosis not present

## 2023-08-11 DIAGNOSIS — Z9889 Other specified postprocedural states: Secondary | ICD-10-CM | POA: Diagnosis not present

## 2023-08-12 DIAGNOSIS — Z9889 Other specified postprocedural states: Secondary | ICD-10-CM | POA: Diagnosis not present

## 2023-08-12 DIAGNOSIS — I509 Heart failure, unspecified: Secondary | ICD-10-CM | POA: Diagnosis not present

## 2023-08-12 DIAGNOSIS — R0600 Dyspnea, unspecified: Secondary | ICD-10-CM | POA: Diagnosis not present

## 2023-08-12 DIAGNOSIS — N179 Acute kidney failure, unspecified: Secondary | ICD-10-CM | POA: Diagnosis not present

## 2023-08-13 DIAGNOSIS — R0600 Dyspnea, unspecified: Secondary | ICD-10-CM | POA: Diagnosis not present

## 2023-08-13 DIAGNOSIS — J90 Pleural effusion, not elsewhere classified: Secondary | ICD-10-CM | POA: Diagnosis not present

## 2023-08-13 DIAGNOSIS — N179 Acute kidney failure, unspecified: Secondary | ICD-10-CM | POA: Diagnosis not present

## 2023-08-13 DIAGNOSIS — J811 Chronic pulmonary edema: Secondary | ICD-10-CM | POA: Diagnosis not present

## 2023-08-13 DIAGNOSIS — Z9889 Other specified postprocedural states: Secondary | ICD-10-CM | POA: Diagnosis not present

## 2023-08-14 DIAGNOSIS — T826XXA Infection and inflammatory reaction due to cardiac valve prosthesis, initial encounter: Secondary | ICD-10-CM | POA: Diagnosis not present

## 2023-08-14 DIAGNOSIS — B377 Candidal sepsis: Secondary | ICD-10-CM | POA: Diagnosis not present

## 2023-08-14 DIAGNOSIS — N179 Acute kidney failure, unspecified: Secondary | ICD-10-CM | POA: Diagnosis not present

## 2023-08-14 DIAGNOSIS — K8020 Calculus of gallbladder without cholecystitis without obstruction: Secondary | ICD-10-CM | POA: Diagnosis not present

## 2023-08-14 DIAGNOSIS — R579 Shock, unspecified: Secondary | ICD-10-CM | POA: Diagnosis not present

## 2023-08-14 DIAGNOSIS — R0600 Dyspnea, unspecified: Secondary | ICD-10-CM | POA: Diagnosis not present

## 2023-08-14 DIAGNOSIS — Z9889 Other specified postprocedural states: Secondary | ICD-10-CM | POA: Diagnosis not present

## 2023-08-14 DIAGNOSIS — K828 Other specified diseases of gallbladder: Secondary | ICD-10-CM | POA: Diagnosis not present

## 2023-08-14 DIAGNOSIS — Z452 Encounter for adjustment and management of vascular access device: Secondary | ICD-10-CM | POA: Diagnosis not present

## 2023-08-14 DIAGNOSIS — K838 Other specified diseases of biliary tract: Secondary | ICD-10-CM | POA: Diagnosis not present

## 2023-08-15 DIAGNOSIS — J984 Other disorders of lung: Secondary | ICD-10-CM | POA: Diagnosis not present

## 2023-08-15 DIAGNOSIS — J988 Other specified respiratory disorders: Secondary | ICD-10-CM | POA: Diagnosis not present

## 2023-08-15 DIAGNOSIS — I4892 Unspecified atrial flutter: Secondary | ICD-10-CM | POA: Diagnosis not present

## 2023-08-15 DIAGNOSIS — G9341 Metabolic encephalopathy: Secondary | ICD-10-CM | POA: Diagnosis not present

## 2023-08-15 DIAGNOSIS — D62 Acute posthemorrhagic anemia: Secondary | ICD-10-CM | POA: Diagnosis not present

## 2023-08-15 DIAGNOSIS — I5181 Takotsubo syndrome: Secondary | ICD-10-CM | POA: Diagnosis not present

## 2023-08-15 DIAGNOSIS — R579 Shock, unspecified: Secondary | ICD-10-CM | POA: Diagnosis not present

## 2023-08-15 DIAGNOSIS — R932 Abnormal findings on diagnostic imaging of liver and biliary tract: Secondary | ICD-10-CM | POA: Diagnosis not present

## 2023-08-15 DIAGNOSIS — T826XXA Infection and inflammatory reaction due to cardiac valve prosthesis, initial encounter: Secondary | ICD-10-CM | POA: Diagnosis not present

## 2023-08-15 DIAGNOSIS — R570 Cardiogenic shock: Secondary | ICD-10-CM | POA: Diagnosis not present

## 2023-08-15 DIAGNOSIS — Z9889 Other specified postprocedural states: Secondary | ICD-10-CM | POA: Diagnosis not present

## 2023-08-15 DIAGNOSIS — B377 Candidal sepsis: Secondary | ICD-10-CM | POA: Diagnosis not present

## 2023-08-15 DIAGNOSIS — Z7901 Long term (current) use of anticoagulants: Secondary | ICD-10-CM | POA: Diagnosis not present

## 2023-08-15 DIAGNOSIS — A419 Sepsis, unspecified organism: Secondary | ICD-10-CM | POA: Diagnosis not present

## 2023-08-15 DIAGNOSIS — R7401 Elevation of levels of liver transaminase levels: Secondary | ICD-10-CM | POA: Diagnosis not present

## 2023-08-15 DIAGNOSIS — R0600 Dyspnea, unspecified: Secondary | ICD-10-CM | POA: Diagnosis not present

## 2023-08-15 DIAGNOSIS — N179 Acute kidney failure, unspecified: Secondary | ICD-10-CM | POA: Diagnosis not present

## 2023-08-16 DIAGNOSIS — N179 Acute kidney failure, unspecified: Secondary | ICD-10-CM | POA: Diagnosis not present

## 2023-08-16 DIAGNOSIS — Z9889 Other specified postprocedural states: Secondary | ICD-10-CM | POA: Diagnosis not present

## 2023-08-16 DIAGNOSIS — Z452 Encounter for adjustment and management of vascular access device: Secondary | ICD-10-CM | POA: Diagnosis not present

## 2023-08-16 DIAGNOSIS — R0902 Hypoxemia: Secondary | ICD-10-CM | POA: Diagnosis not present

## 2023-08-16 DIAGNOSIS — N281 Cyst of kidney, acquired: Secondary | ICD-10-CM | POA: Diagnosis not present

## 2023-08-16 DIAGNOSIS — K8020 Calculus of gallbladder without cholecystitis without obstruction: Secondary | ICD-10-CM | POA: Diagnosis not present

## 2023-08-16 DIAGNOSIS — R918 Other nonspecific abnormal finding of lung field: Secondary | ICD-10-CM | POA: Diagnosis not present

## 2023-08-16 DIAGNOSIS — D649 Anemia, unspecified: Secondary | ICD-10-CM | POA: Diagnosis not present

## 2023-08-17 DIAGNOSIS — R0600 Dyspnea, unspecified: Secondary | ICD-10-CM | POA: Diagnosis not present

## 2023-08-17 DIAGNOSIS — Z452 Encounter for adjustment and management of vascular access device: Secondary | ICD-10-CM | POA: Diagnosis not present

## 2023-08-17 DIAGNOSIS — R0902 Hypoxemia: Secondary | ICD-10-CM | POA: Diagnosis not present

## 2023-08-17 DIAGNOSIS — N179 Acute kidney failure, unspecified: Secondary | ICD-10-CM | POA: Diagnosis not present

## 2023-08-17 DIAGNOSIS — Z9889 Other specified postprocedural states: Secondary | ICD-10-CM | POA: Diagnosis not present

## 2023-08-18 DIAGNOSIS — R579 Shock, unspecified: Secondary | ICD-10-CM | POA: Diagnosis not present

## 2023-08-18 DIAGNOSIS — J988 Other specified respiratory disorders: Secondary | ICD-10-CM | POA: Diagnosis not present

## 2023-08-18 DIAGNOSIS — G9341 Metabolic encephalopathy: Secondary | ICD-10-CM | POA: Diagnosis not present

## 2023-08-18 DIAGNOSIS — A419 Sepsis, unspecified organism: Secondary | ICD-10-CM | POA: Diagnosis not present

## 2023-08-18 DIAGNOSIS — J684 Chronic respiratory conditions due to chemicals, gases, fumes and vapors: Secondary | ICD-10-CM | POA: Diagnosis not present

## 2023-08-18 DIAGNOSIS — B377 Candidal sepsis: Secondary | ICD-10-CM | POA: Diagnosis not present

## 2023-08-18 DIAGNOSIS — R0600 Dyspnea, unspecified: Secondary | ICD-10-CM | POA: Diagnosis not present

## 2023-08-18 DIAGNOSIS — R570 Cardiogenic shock: Secondary | ICD-10-CM | POA: Diagnosis not present

## 2023-08-18 DIAGNOSIS — J90 Pleural effusion, not elsewhere classified: Secondary | ICD-10-CM | POA: Diagnosis not present

## 2023-08-18 DIAGNOSIS — Z9889 Other specified postprocedural states: Secondary | ICD-10-CM | POA: Diagnosis not present

## 2023-08-18 DIAGNOSIS — D62 Acute posthemorrhagic anemia: Secondary | ICD-10-CM | POA: Diagnosis not present

## 2023-08-18 DIAGNOSIS — J984 Other disorders of lung: Secondary | ICD-10-CM | POA: Diagnosis not present

## 2023-08-18 DIAGNOSIS — N179 Acute kidney failure, unspecified: Secondary | ICD-10-CM | POA: Diagnosis not present

## 2023-08-18 DIAGNOSIS — T826XXA Infection and inflammatory reaction due to cardiac valve prosthesis, initial encounter: Secondary | ICD-10-CM | POA: Diagnosis not present

## 2023-08-18 DIAGNOSIS — R945 Abnormal results of liver function studies: Secondary | ICD-10-CM | POA: Diagnosis not present

## 2023-08-19 ENCOUNTER — Ambulatory Visit: Admitting: Family Medicine

## 2023-08-19 DIAGNOSIS — R570 Cardiogenic shock: Secondary | ICD-10-CM | POA: Diagnosis not present

## 2023-08-19 DIAGNOSIS — J9600 Acute respiratory failure, unspecified whether with hypoxia or hypercapnia: Secondary | ICD-10-CM | POA: Diagnosis not present

## 2023-08-19 DIAGNOSIS — D62 Acute posthemorrhagic anemia: Secondary | ICD-10-CM | POA: Diagnosis not present

## 2023-08-19 DIAGNOSIS — E872 Acidosis, unspecified: Secondary | ICD-10-CM | POA: Diagnosis not present

## 2023-08-19 DIAGNOSIS — R579 Shock, unspecified: Secondary | ICD-10-CM | POA: Diagnosis not present

## 2023-08-19 DIAGNOSIS — R0600 Dyspnea, unspecified: Secondary | ICD-10-CM | POA: Diagnosis not present

## 2023-08-19 DIAGNOSIS — J984 Other disorders of lung: Secondary | ICD-10-CM | POA: Diagnosis not present

## 2023-08-19 DIAGNOSIS — R945 Abnormal results of liver function studies: Secondary | ICD-10-CM | POA: Diagnosis not present

## 2023-08-19 DIAGNOSIS — J811 Chronic pulmonary edema: Secondary | ICD-10-CM | POA: Diagnosis not present

## 2023-08-19 DIAGNOSIS — N179 Acute kidney failure, unspecified: Secondary | ICD-10-CM | POA: Diagnosis not present

## 2023-08-19 DIAGNOSIS — Z9889 Other specified postprocedural states: Secondary | ICD-10-CM | POA: Diagnosis not present

## 2023-08-19 DIAGNOSIS — J90 Pleural effusion, not elsewhere classified: Secondary | ICD-10-CM | POA: Diagnosis not present

## 2023-08-20 DIAGNOSIS — I998 Other disorder of circulatory system: Secondary | ICD-10-CM | POA: Diagnosis not present

## 2023-08-20 DIAGNOSIS — E669 Obesity, unspecified: Secondary | ICD-10-CM | POA: Diagnosis not present

## 2023-08-20 DIAGNOSIS — J811 Chronic pulmonary edema: Secondary | ICD-10-CM | POA: Diagnosis not present

## 2023-08-20 DIAGNOSIS — D72829 Elevated white blood cell count, unspecified: Secondary | ICD-10-CM | POA: Diagnosis not present

## 2023-08-20 DIAGNOSIS — J984 Other disorders of lung: Secondary | ICD-10-CM | POA: Diagnosis not present

## 2023-08-20 DIAGNOSIS — R945 Abnormal results of liver function studies: Secondary | ICD-10-CM | POA: Diagnosis not present

## 2023-08-20 DIAGNOSIS — R579 Shock, unspecified: Secondary | ICD-10-CM | POA: Diagnosis not present

## 2023-08-20 DIAGNOSIS — Z9889 Other specified postprocedural states: Secondary | ICD-10-CM | POA: Diagnosis not present

## 2023-08-20 DIAGNOSIS — I82721 Chronic embolism and thrombosis of deep veins of right upper extremity: Secondary | ICD-10-CM | POA: Diagnosis not present

## 2023-08-20 DIAGNOSIS — Z452 Encounter for adjustment and management of vascular access device: Secondary | ICD-10-CM | POA: Diagnosis not present

## 2023-08-20 DIAGNOSIS — R079 Chest pain, unspecified: Secondary | ICD-10-CM | POA: Diagnosis not present

## 2023-08-20 DIAGNOSIS — Z4682 Encounter for fitting and adjustment of non-vascular catheter: Secondary | ICD-10-CM | POA: Diagnosis not present

## 2023-08-20 DIAGNOSIS — N179 Acute kidney failure, unspecified: Secondary | ICD-10-CM | POA: Diagnosis not present

## 2023-08-20 DIAGNOSIS — R109 Unspecified abdominal pain: Secondary | ICD-10-CM | POA: Diagnosis not present

## 2023-08-20 DIAGNOSIS — J90 Pleural effusion, not elsewhere classified: Secondary | ICD-10-CM | POA: Diagnosis not present

## 2023-08-20 DIAGNOSIS — I472 Ventricular tachycardia, unspecified: Secondary | ICD-10-CM | POA: Diagnosis not present

## 2023-08-20 DIAGNOSIS — B377 Candidal sepsis: Secondary | ICD-10-CM | POA: Diagnosis not present

## 2023-08-20 DIAGNOSIS — T50905A Adverse effect of unspecified drugs, medicaments and biological substances, initial encounter: Secondary | ICD-10-CM | POA: Diagnosis not present

## 2023-08-20 DIAGNOSIS — R0600 Dyspnea, unspecified: Secondary | ICD-10-CM | POA: Diagnosis not present

## 2023-08-20 DIAGNOSIS — I509 Heart failure, unspecified: Secondary | ICD-10-CM | POA: Diagnosis not present

## 2023-08-20 DIAGNOSIS — I742 Embolism and thrombosis of arteries of the upper extremities: Secondary | ICD-10-CM | POA: Diagnosis not present

## 2023-08-20 DIAGNOSIS — E875 Hyperkalemia: Secondary | ICD-10-CM | POA: Diagnosis not present

## 2023-08-20 DIAGNOSIS — R739 Hyperglycemia, unspecified: Secondary | ICD-10-CM | POA: Diagnosis not present

## 2023-08-20 DIAGNOSIS — R0902 Hypoxemia: Secondary | ICD-10-CM | POA: Diagnosis not present

## 2023-08-20 DIAGNOSIS — Z794 Long term (current) use of insulin: Secondary | ICD-10-CM | POA: Diagnosis not present

## 2023-08-21 DIAGNOSIS — Z66 Do not resuscitate: Secondary | ICD-10-CM | POA: Diagnosis not present

## 2023-09-04 DEATH — deceased

## 2023-10-10 IMAGING — CR DG KNEE COMPLETE 4+V*R*
1 series · 4 of 4 positions shown · non-contrast
Comparison: None Available.

CLINICAL DATA: Right knee swelling

EXAM:
RIGHT KNEE - COMPLETE 4+ VIEW

[Series 1: dg knee complete 4 views right · 0.14mm/px · 4 of 4 slices shown]
[im 1/4]
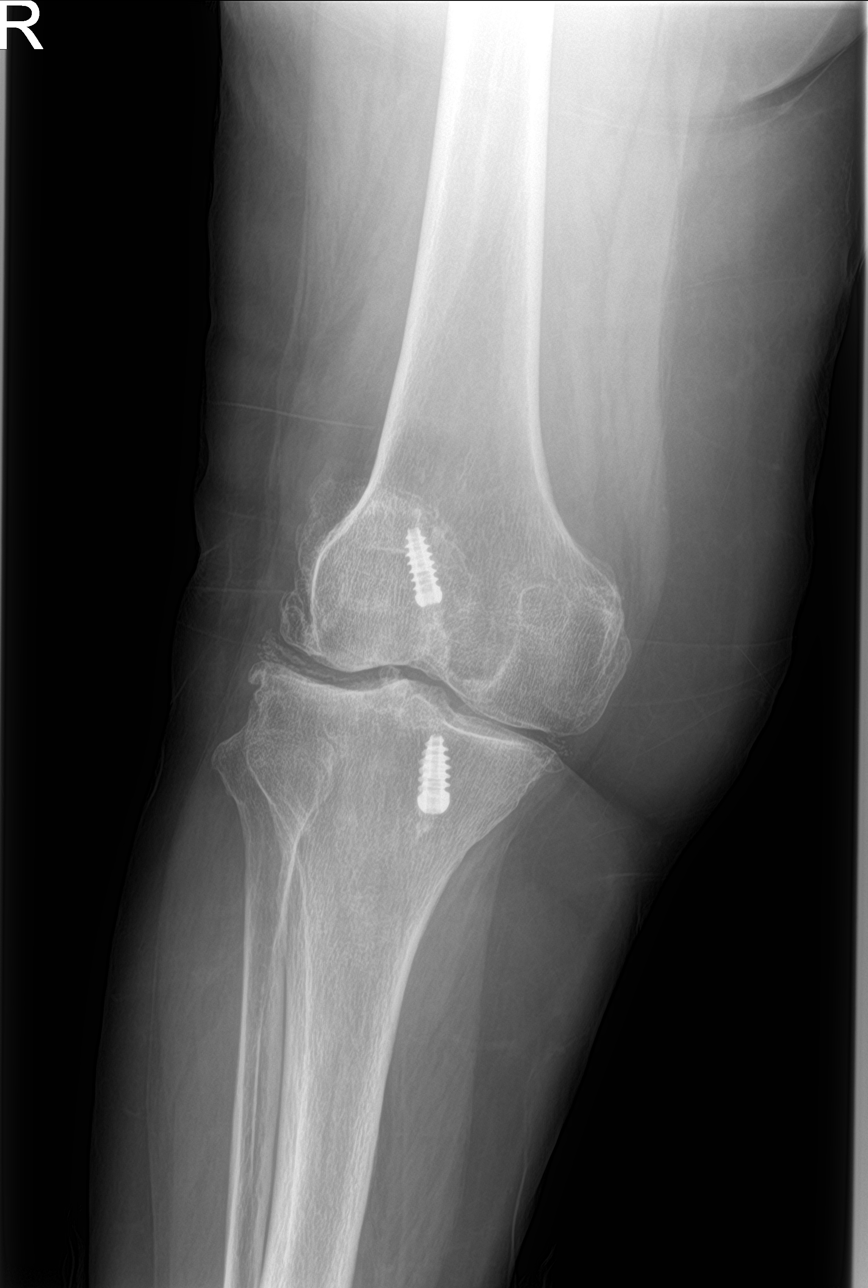
[im 2/4]
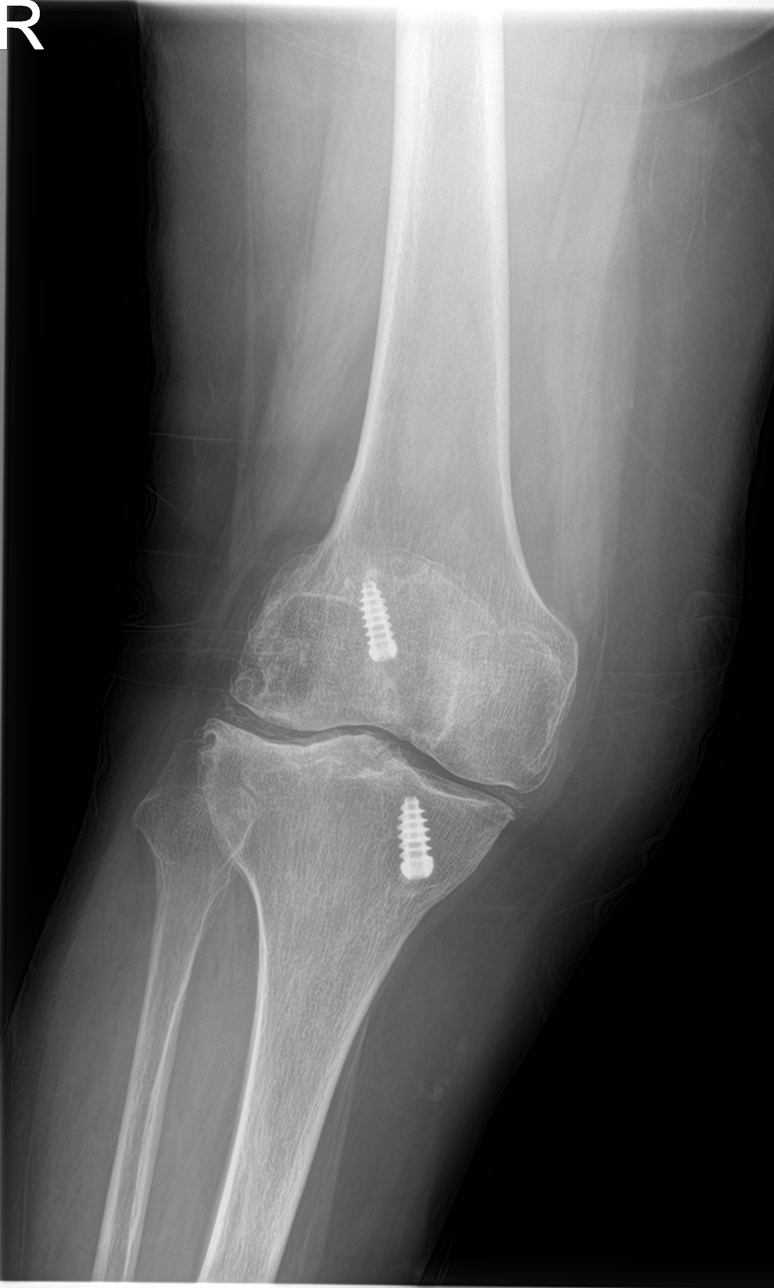
[im 3/4]
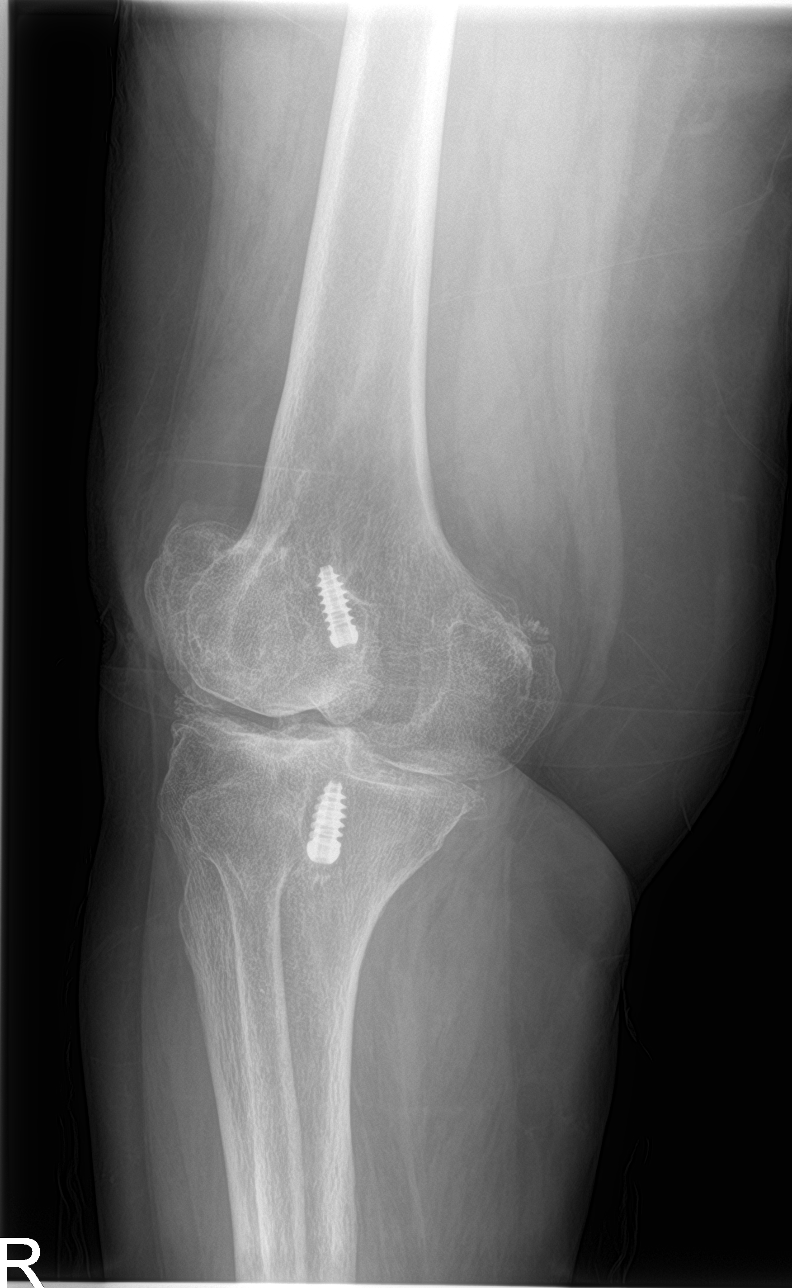
[im 4/4]
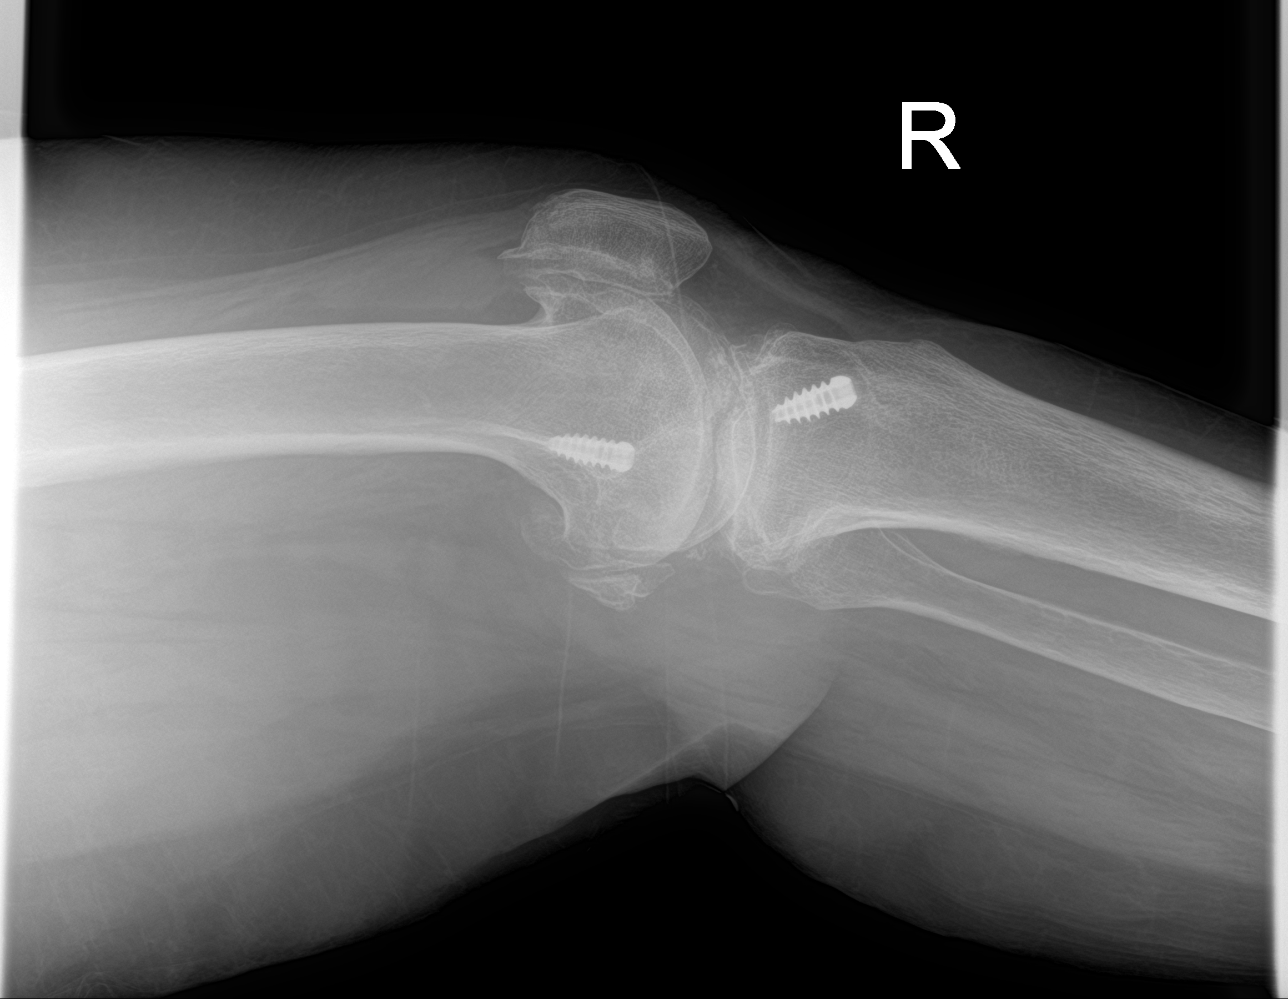

[4 of 4 positions shown; findings below may reference images not displayed]

FINDINGS: No evidence of acute fracture. Moderate-severe tricompartmental
osteoarthritis with joint space loss and bulky marginal osteophyte
formation. Moderate size knee joint effusion. Hardware related to
prior ACL surgery. No focal soft tissue swelling is evident.
IMPRESSION: Moderate-severe tricompartmental osteoarthritis of the right knee
with a moderate size knee joint effusion.

## 2023-10-13 ENCOUNTER — Telehealth: Payer: Self-pay

## 2023-10-13 DIAGNOSIS — Z9189 Other specified personal risk factors, not elsewhere classified: Secondary | ICD-10-CM

## 2023-10-13 NOTE — Progress Notes (Signed)
   10/13/2023  Patient ID: Tina Peters, female   DOB: 1950/12/05, 73 y.o.   MRN: [id number]  This patient is appearing on a report for being at risk of failing the adherence measure for identified medications this calendar year.   Medication Adherence Summary (STAR/HEDIS Monitoring): Adherence Category: diabetes - h/o of CHF and prediabetes    Drug Name: Jardiance  10 mg  Last Fill or Sold Date:06/28/2023 Days' Supply: 30     Notes: ? Adherence data pulled from pharmacy claims portal Dr. Cyrus Drone called patient but was unable tor reach. She did not have a voicemail setup to leave a message. Per chart review, it appears patient should be on medication.  ? Reviewed barriers to adherence: none identified. ? Plan: Will collaborate with [facility] pharmacist to clarify if she received medication assistance.   Alexandria Angel, PharmD Clinical Pharmacist Cell: [phone]

## 2023-10-15 ENCOUNTER — Telehealth: Payer: Self-pay

## 2023-10-15 DIAGNOSIS — Z79899 Other long term (current) drug therapy: Secondary | ICD-10-CM

## 2023-10-15 NOTE — Progress Notes (Signed)
   10/15/2023  Patient ID: Tina Peters, female   DOB: 31-Jul-1950, 73 y.o.   MRN: [id number]  This patient is appearing on a report for being at risk of failing the adherence measure for identified medications this calendar year.   Medication Adherence Summary (STAR/HEDIS Monitoring): Adherence Category: diabetes - h/o of CHF and prediabetes    Drug Name: Jardiance  10 mg  Last Fill or Sold Date:06/28/2023 Days' Supply: 30   Drug Name: Atorvastatin  40 mg  Last Fill or Sold Date:05/10/2023 Days' Supply: 90 - 07/01/2023 cardiolgy note, atorvasatin d/c d/t elevated LFTs      Notes: ? Adherence data pulled from pharmacy claims portal Dr. Cyrus Drone called patient again today but was unable to reach. She did not have a voicemail setup to leave a message. Per chart review, it appears patient should be on Jardiance  based on last office visit notes. However, she was seen in the ER on 07/10/2023 and the medication was discontinued.  ? Reviewed barriers to adherence: none identified. ? Plan: Will follow up on medication.   Alexandria Angel, PharmD Clinical Pharmacist Cell: [phone]

## 2023-10-22 ENCOUNTER — Telehealth: Payer: Self-pay

## 2023-10-22 NOTE — Progress Notes (Signed)
   10/22/2023  Patient ID: Tina Peters, female   DOB: 10-05-1950, 72 y.o.   MRN: [id number]  Attempted to contact patient for medication management/review. Left HIPAA compliant message for patient to return my call at their convenience. Per Dr. Anson Basta, patient has not filled Jardiance  since February of this year. Followed up with heart failure clinic regarding use of medication or to see if patient received a sample.   Thank you for allowing pharmacy to be a part of this patient's care.  Alexandria Angel, PharmD Clinical Pharmacist Cell: [phone]

## 2023-10-24 NOTE — Progress Notes (Signed)
   10/24/2023  Patient ID: Tina Peters, female   DOB: 08-Dec-1950, 73 y.o.   MRN: [id number]  Per chart review, patient expired on Aug 29, 2023 during hospital admission in March; this pharmacist sent a message to PCP as the patient is not marked deceased.   Alexandria Angel, PharmD Clinical Pharmacist Cell: [phone]

## 2024-03-02 ENCOUNTER — Encounter
